# Patient Record
Sex: Female | Born: 1950 | Race: White | Hispanic: No | Marital: Married | Smoking: Never smoker
Health system: Southern US, Community
[De-identification: ages and names within clinical notes are randomized; demographics above are authoritative.]

## PROBLEM LIST (undated history)

## (undated) DIAGNOSIS — D649 Anemia, unspecified: Secondary | ICD-10-CM

## (undated) DIAGNOSIS — T4145XA Adverse effect of unspecified anesthetic, initial encounter: Secondary | ICD-10-CM

## (undated) DIAGNOSIS — I272 Pulmonary hypertension, unspecified: Secondary | ICD-10-CM

## (undated) DIAGNOSIS — I10 Essential (primary) hypertension: Secondary | ICD-10-CM

## (undated) DIAGNOSIS — M810 Age-related osteoporosis without current pathological fracture: Secondary | ICD-10-CM

## (undated) DIAGNOSIS — I4891 Unspecified atrial fibrillation: Secondary | ICD-10-CM

## (undated) DIAGNOSIS — Z9081 Acquired absence of spleen: Secondary | ICD-10-CM

## (undated) DIAGNOSIS — J309 Allergic rhinitis, unspecified: Secondary | ICD-10-CM

## (undated) DIAGNOSIS — I340 Nonrheumatic mitral (valve) insufficiency: Secondary | ICD-10-CM

## (undated) DIAGNOSIS — D5521 Anemia due to pyruvate kinase deficiency: Secondary | ICD-10-CM

## (undated) DIAGNOSIS — R55 Syncope and collapse: Secondary | ICD-10-CM

## (undated) DIAGNOSIS — R0902 Hypoxemia: Secondary | ICD-10-CM

## (undated) DIAGNOSIS — R5383 Other fatigue: Secondary | ICD-10-CM

## (undated) DIAGNOSIS — I471 Supraventricular tachycardia: Secondary | ICD-10-CM

## (undated) DIAGNOSIS — R0600 Dyspnea, unspecified: Secondary | ICD-10-CM

## (undated) DIAGNOSIS — D552 Anemia due to disorders of glycolytic enzymes: Secondary | ICD-10-CM

## (undated) DIAGNOSIS — K2970 Gastritis, unspecified, without bleeding: Secondary | ICD-10-CM

## (undated) DIAGNOSIS — K219 Gastro-esophageal reflux disease without esophagitis: Secondary | ICD-10-CM

## (undated) DIAGNOSIS — I4719 Other supraventricular tachycardia: Secondary | ICD-10-CM

## (undated) DIAGNOSIS — F32A Depression, unspecified: Secondary | ICD-10-CM

## (undated) DIAGNOSIS — J45909 Unspecified asthma, uncomplicated: Secondary | ICD-10-CM

## (undated) DIAGNOSIS — F329 Major depressive disorder, single episode, unspecified: Secondary | ICD-10-CM

## (undated) DIAGNOSIS — G253 Myoclonus: Secondary | ICD-10-CM

## (undated) DIAGNOSIS — M7060 Trochanteric bursitis, unspecified hip: Secondary | ICD-10-CM

## (undated) DIAGNOSIS — R011 Cardiac murmur, unspecified: Secondary | ICD-10-CM

## (undated) DIAGNOSIS — T8859XA Other complications of anesthesia, initial encounter: Secondary | ICD-10-CM

## (undated) HISTORY — DX: Age-related osteoporosis without current pathological fracture: M81.0

## (undated) HISTORY — DX: Dyspnea, unspecified: R06.00

## (undated) HISTORY — DX: Other supraventricular tachycardia: I47.19

## (undated) HISTORY — DX: Allergic rhinitis, unspecified: J30.9

## (undated) HISTORY — DX: Gastro-esophageal reflux disease without esophagitis: K21.9

## (undated) HISTORY — DX: Myoclonus: G25.3

## (undated) HISTORY — DX: Nonrheumatic mitral (valve) insufficiency: I34.0

## (undated) HISTORY — PX: CHOLECYSTECTOMY: SHX55

## (undated) HISTORY — DX: Anemia due to pyruvate kinase deficiency: D55.21

## (undated) HISTORY — DX: Anemia, unspecified: D64.9

## (undated) HISTORY — DX: Acquired absence of spleen: Z90.81

## (undated) HISTORY — DX: Essential (primary) hypertension: I10

## (undated) HISTORY — DX: Supraventricular tachycardia: I47.1

## (undated) HISTORY — PX: SPLENECTOMY: SUR1306

## (undated) HISTORY — DX: Trochanteric bursitis, unspecified hip: M70.60

## (undated) HISTORY — DX: Major depressive disorder, single episode, unspecified: F32.9

## (undated) HISTORY — DX: Anemia due to disorders of glycolytic enzymes: D55.2

## (undated) HISTORY — DX: Other fatigue: R53.83

## (undated) HISTORY — PX: ABLATION: SHX5711

## (undated) HISTORY — DX: Depression, unspecified: F32.A

## (undated) HISTORY — PX: MAZE: SHX5063

## (undated) HISTORY — DX: Pulmonary hypertension, unspecified: I27.20

## (undated) HISTORY — DX: Gastritis, unspecified, without bleeding: K29.70

## (undated) HISTORY — DX: Hypoxemia: R09.02

---

## 1997-09-24 ENCOUNTER — Ambulatory Visit (HOSPITAL_COMMUNITY): Admission: RE | Admit: 1997-09-24 | Discharge: 1997-09-24 | Payer: Self-pay | Admitting: Obstetrics and Gynecology

## 1998-09-01 ENCOUNTER — Other Ambulatory Visit: Admission: RE | Admit: 1998-09-01 | Discharge: 1998-09-01 | Payer: Self-pay | Admitting: Obstetrics and Gynecology

## 1998-12-13 ENCOUNTER — Ambulatory Visit (HOSPITAL_COMMUNITY): Admission: RE | Admit: 1998-12-13 | Discharge: 1998-12-13 | Payer: Self-pay | Admitting: Obstetrics and Gynecology

## 1998-12-13 ENCOUNTER — Encounter: Payer: Self-pay | Admitting: Obstetrics and Gynecology

## 1999-09-21 ENCOUNTER — Other Ambulatory Visit: Admission: RE | Admit: 1999-09-21 | Discharge: 1999-09-21 | Payer: Self-pay | Admitting: Obstetrics and Gynecology

## 2002-06-25 ENCOUNTER — Ambulatory Visit (HOSPITAL_COMMUNITY): Admission: RE | Admit: 2002-06-25 | Discharge: 2002-06-25 | Payer: Self-pay | Admitting: Oncology

## 2002-07-27 ENCOUNTER — Encounter: Payer: Self-pay | Admitting: Obstetrics and Gynecology

## 2002-07-27 ENCOUNTER — Ambulatory Visit (HOSPITAL_COMMUNITY): Admission: RE | Admit: 2002-07-27 | Discharge: 2002-07-27 | Payer: Self-pay | Admitting: Obstetrics and Gynecology

## 2003-07-12 ENCOUNTER — Other Ambulatory Visit: Admission: RE | Admit: 2003-07-12 | Discharge: 2003-07-12 | Payer: Self-pay | Admitting: Obstetrics and Gynecology

## 2004-07-13 ENCOUNTER — Other Ambulatory Visit: Admission: RE | Admit: 2004-07-13 | Discharge: 2004-07-13 | Payer: Self-pay | Admitting: Obstetrics and Gynecology

## 2004-07-20 ENCOUNTER — Ambulatory Visit: Payer: Self-pay | Admitting: Oncology

## 2004-09-08 ENCOUNTER — Ambulatory Visit: Payer: Self-pay | Admitting: Oncology

## 2004-10-04 ENCOUNTER — Inpatient Hospital Stay (HOSPITAL_COMMUNITY): Admission: AD | Admit: 2004-10-04 | Discharge: 2004-10-06 | Payer: Self-pay | Admitting: Cardiology

## 2004-12-11 ENCOUNTER — Ambulatory Visit: Payer: Self-pay | Admitting: Oncology

## 2005-02-01 ENCOUNTER — Ambulatory Visit: Payer: Self-pay | Admitting: Oncology

## 2005-04-09 ENCOUNTER — Ambulatory Visit: Payer: Self-pay | Admitting: Oncology

## 2005-06-11 ENCOUNTER — Ambulatory Visit: Payer: Self-pay | Admitting: Oncology

## 2005-08-24 ENCOUNTER — Ambulatory Visit: Payer: Self-pay | Admitting: Oncology

## 2005-11-23 ENCOUNTER — Ambulatory Visit: Payer: Self-pay | Admitting: Oncology

## 2005-12-03 LAB — COMPREHENSIVE METABOLIC PANEL
ALT: 16 U/L (ref 0–40)
CO2: 26 mEq/L (ref 19–32)
Calcium: 9.3 mg/dL (ref 8.4–10.5)
Chloride: 106 mEq/L (ref 96–112)
Creatinine, Ser: 0.66 mg/dL (ref 0.40–1.20)
Glucose, Bld: 97 mg/dL (ref 70–99)
Sodium: 139 mEq/L (ref 135–145)
Total Bilirubin: 2.3 mg/dL — ABNORMAL HIGH (ref 0.3–1.2)
Total Protein: 6.9 g/dL (ref 6.0–8.3)

## 2005-12-03 LAB — CBC WITH DIFFERENTIAL/PLATELET
Eosinophils Absolute: 0.6 10*3/uL — ABNORMAL HIGH (ref 0.0–0.5)
HGB: 8.5 g/dL — ABNORMAL LOW (ref 11.6–15.9)
MCV: 115.8 fL — ABNORMAL HIGH (ref 81.0–101.0)
MONO#: 1.4 10*3/uL — ABNORMAL HIGH (ref 0.1–0.9)
MONO%: 14.7 % — ABNORMAL HIGH (ref 0.0–13.0)
NEUT#: 3.9 10*3/uL (ref 1.5–6.5)
RBC: 2.19 10*6/uL — ABNORMAL LOW (ref 3.70–5.32)
RDW: 14.3 % (ref 11.3–14.5)
WBC: 9.3 10*3/uL (ref 3.9–10.0)
lymph#: 3.4 10*3/uL — ABNORMAL HIGH (ref 0.9–3.3)

## 2006-02-21 ENCOUNTER — Ambulatory Visit: Payer: Self-pay | Admitting: Oncology

## 2006-02-25 LAB — CBC WITH DIFFERENTIAL/PLATELET
Basophils Absolute: 0.1 10*3/uL (ref 0.0–0.1)
Eosinophils Absolute: 0.6 10*3/uL — ABNORMAL HIGH (ref 0.0–0.5)
HCT: 24.7 % — ABNORMAL LOW (ref 34.8–46.6)
HGB: 8.1 g/dL — ABNORMAL LOW (ref 11.6–15.9)
MCH: 38.4 pg — ABNORMAL HIGH (ref 26.0–34.0)
NEUT#: 4.3 10*3/uL (ref 1.5–6.5)
NEUT%: 42.7 % (ref 39.6–76.8)
RDW: 14.1 % (ref 11.3–14.5)
lymph#: 3.7 10*3/uL — ABNORMAL HIGH (ref 0.9–3.3)

## 2006-02-25 LAB — COMPREHENSIVE METABOLIC PANEL
Albumin: 4.2 g/dL (ref 3.5–5.2)
BUN: 19 mg/dL (ref 6–23)
CO2: 26 mEq/L (ref 19–32)
Calcium: 9.7 mg/dL (ref 8.4–10.5)
Chloride: 106 mEq/L (ref 96–112)
Creatinine, Ser: 0.77 mg/dL (ref 0.40–1.20)
Glucose, Bld: 74 mg/dL (ref 70–99)
Potassium: 4.1 mEq/L (ref 3.5–5.3)

## 2006-02-25 LAB — FERRITIN: Ferritin: 82 ng/mL (ref 10–291)

## 2006-02-28 ENCOUNTER — Other Ambulatory Visit: Admission: RE | Admit: 2006-02-28 | Discharge: 2006-02-28 | Payer: Self-pay | Admitting: Obstetrics and Gynecology

## 2006-03-22 ENCOUNTER — Ambulatory Visit: Payer: Self-pay | Admitting: Oncology

## 2006-05-08 ENCOUNTER — Ambulatory Visit: Payer: Self-pay | Admitting: Oncology

## 2006-06-03 LAB — COMPREHENSIVE METABOLIC PANEL
ALT: 18 U/L (ref 0–35)
Albumin: 4.4 g/dL (ref 3.5–5.2)
CO2: 25 mEq/L (ref 19–32)
Calcium: 9.4 mg/dL (ref 8.4–10.5)
Chloride: 104 mEq/L (ref 96–112)
Glucose, Bld: 99 mg/dL (ref 70–99)
Potassium: 4.3 mEq/L (ref 3.5–5.3)
Sodium: 138 mEq/L (ref 135–145)
Total Protein: 7.4 g/dL (ref 6.0–8.3)

## 2006-06-03 LAB — CBC WITH DIFFERENTIAL/PLATELET
BASO%: 0.8 % (ref 0.0–2.0)
Eosinophils Absolute: 0.4 10*3/uL (ref 0.0–0.5)
LYMPH%: 39.8 % (ref 14.0–48.0)
MCHC: 31.9 g/dL — ABNORMAL LOW (ref 32.0–36.0)
MONO#: 1.1 10*3/uL — ABNORMAL HIGH (ref 0.1–0.9)
NEUT#: 2.9 10*3/uL (ref 1.5–6.5)
Platelets: 590 10*3/uL — ABNORMAL HIGH (ref 145–400)
RBC: 2.43 10*6/uL — ABNORMAL LOW (ref 3.70–5.32)
RDW: 14.3 % (ref 11.3–14.5)
WBC: 7.5 10*3/uL (ref 3.9–10.0)
lymph#: 3 10*3/uL (ref 0.9–3.3)

## 2006-06-03 LAB — FERRITIN: Ferritin: 70 ng/mL (ref 10–291)

## 2006-08-28 ENCOUNTER — Ambulatory Visit: Payer: Self-pay | Admitting: Oncology

## 2006-09-24 LAB — CBC WITH DIFFERENTIAL/PLATELET
BASO%: 1.5 % (ref 0.0–2.0)
EOS%: 3.9 % (ref 0.0–7.0)
HCT: 25.4 % — ABNORMAL LOW (ref 34.8–46.6)
MCH: 37.3 pg — ABNORMAL HIGH (ref 26.0–34.0)
MCHC: 33.5 g/dL (ref 32.0–36.0)
MONO#: 1.2 10*3/uL — ABNORMAL HIGH (ref 0.1–0.9)
NEUT%: 42.7 % (ref 39.6–76.8)
RBC: 2.29 10*6/uL — ABNORMAL LOW (ref 3.70–5.32)
WBC: 9.1 10*3/uL (ref 3.9–10.0)
lymph#: 3.5 10*3/uL — ABNORMAL HIGH (ref 0.9–3.3)

## 2006-09-24 LAB — COMPREHENSIVE METABOLIC PANEL
ALT: 12 U/L (ref 0–35)
AST: 19 U/L (ref 0–37)
CO2: 25 mEq/L (ref 19–32)
Calcium: 9.2 mg/dL (ref 8.4–10.5)
Chloride: 106 mEq/L (ref 96–112)
Creatinine, Ser: 0.79 mg/dL (ref 0.40–1.20)
Sodium: 139 mEq/L (ref 135–145)
Total Bilirubin: 2.1 mg/dL — ABNORMAL HIGH (ref 0.3–1.2)
Total Protein: 7.2 g/dL (ref 6.0–8.3)

## 2006-09-24 LAB — FERRITIN: Ferritin: 125 ng/mL (ref 10–291)

## 2006-09-24 LAB — UA PROTEIN, DIPSTICK - CHCC: Protein, Urine: NEGATIVE mg/dL

## 2006-11-03 ENCOUNTER — Inpatient Hospital Stay (HOSPITAL_COMMUNITY): Admission: EM | Admit: 2006-11-03 | Discharge: 2006-11-05 | Payer: Self-pay | Admitting: Emergency Medicine

## 2006-12-19 ENCOUNTER — Ambulatory Visit: Payer: Self-pay | Admitting: Oncology

## 2006-12-24 LAB — CBC WITH DIFFERENTIAL/PLATELET
BASO%: 1 % (ref 0.0–2.0)
EOS%: 3.4 % (ref 0.0–7.0)
MCH: 38.1 pg — ABNORMAL HIGH (ref 26.0–34.0)
MCHC: 33.4 g/dL (ref 32.0–36.0)
MONO#: 1.3 10*3/uL — ABNORMAL HIGH (ref 0.1–0.9)
RDW: 14.1 % (ref 11.3–14.5)
WBC: 8.1 10*3/uL (ref 3.9–10.0)
lymph#: 3.5 10*3/uL — ABNORMAL HIGH (ref 0.9–3.3)

## 2006-12-24 LAB — COMPREHENSIVE METABOLIC PANEL
ALT: 16 U/L (ref 0–35)
AST: 21 U/L (ref 0–37)
Albumin: 4.4 g/dL (ref 3.5–5.2)
CO2: 24 mEq/L (ref 19–32)
Calcium: 9.3 mg/dL (ref 8.4–10.5)
Chloride: 103 mEq/L (ref 96–112)
Potassium: 4.6 mEq/L (ref 3.5–5.3)
Sodium: 138 mEq/L (ref 135–145)
Total Protein: 7 g/dL (ref 6.0–8.3)

## 2007-03-03 ENCOUNTER — Other Ambulatory Visit: Admission: RE | Admit: 2007-03-03 | Discharge: 2007-03-03 | Payer: Self-pay | Admitting: Obstetrics and Gynecology

## 2007-04-03 ENCOUNTER — Ambulatory Visit: Payer: Self-pay | Admitting: Oncology

## 2007-04-07 LAB — CBC WITH DIFFERENTIAL/PLATELET
Eosinophils Absolute: 0.2 10*3/uL (ref 0.0–0.5)
LYMPH%: 43.4 % (ref 14.0–48.0)
MCHC: 33.6 g/dL (ref 32.0–36.0)
MCV: 113.9 fL — ABNORMAL HIGH (ref 81.0–101.0)
MONO%: 11.4 % (ref 0.0–13.0)
NEUT#: 3.7 10*3/uL (ref 1.5–6.5)
Platelets: 522 10*3/uL — ABNORMAL HIGH (ref 145–400)
RBC: 2.09 10*6/uL — ABNORMAL LOW (ref 3.70–5.32)

## 2007-04-07 LAB — COMPREHENSIVE METABOLIC PANEL
Alkaline Phosphatase: 77 U/L (ref 39–117)
BUN: 21 mg/dL (ref 6–23)
Creatinine, Ser: 0.86 mg/dL (ref 0.40–1.20)
Glucose, Bld: 88 mg/dL (ref 70–99)
Sodium: 140 mEq/L (ref 135–145)
Total Bilirubin: 1.6 mg/dL — ABNORMAL HIGH (ref 0.3–1.2)
Total Protein: 7 g/dL (ref 6.0–8.3)

## 2007-05-29 HISTORY — PX: OTHER SURGICAL HISTORY: SHX169

## 2007-07-04 ENCOUNTER — Ambulatory Visit: Payer: Self-pay | Admitting: Oncology

## 2007-07-08 LAB — CBC WITH DIFFERENTIAL/PLATELET
BASO%: 0.3 % (ref 0.0–2.0)
EOS%: 0.6 % (ref 0.0–7.0)
MCH: 36.3 pg — ABNORMAL HIGH (ref 26.0–34.0)
MCHC: 32.7 g/dL (ref 32.0–36.0)
RBC: 2.37 10*6/uL — ABNORMAL LOW (ref 3.70–5.32)
RDW: 13.2 % (ref 11.3–14.5)
lymph#: 3.4 10*3/uL — ABNORMAL HIGH (ref 0.9–3.3)

## 2007-07-08 LAB — COMPREHENSIVE METABOLIC PANEL
ALT: 9 U/L (ref 0–35)
AST: 16 U/L (ref 0–37)
Albumin: 4.6 g/dL (ref 3.5–5.2)
Calcium: 9.8 mg/dL (ref 8.4–10.5)
Chloride: 104 mEq/L (ref 96–112)
Potassium: 4.2 mEq/L (ref 3.5–5.3)

## 2007-07-08 LAB — FERRITIN: Ferritin: 50 ng/mL (ref 10–291)

## 2007-08-25 ENCOUNTER — Encounter: Admission: RE | Admit: 2007-08-25 | Discharge: 2007-08-25 | Payer: Self-pay | Admitting: Internal Medicine

## 2007-10-03 ENCOUNTER — Ambulatory Visit: Payer: Self-pay | Admitting: Oncology

## 2007-10-07 ENCOUNTER — Emergency Department (HOSPITAL_COMMUNITY): Admission: EM | Admit: 2007-10-07 | Discharge: 2007-10-07 | Payer: Self-pay | Admitting: Emergency Medicine

## 2007-10-07 ENCOUNTER — Inpatient Hospital Stay (HOSPITAL_COMMUNITY): Admission: EM | Admit: 2007-10-07 | Discharge: 2007-10-10 | Payer: Self-pay | Admitting: Emergency Medicine

## 2007-10-07 ENCOUNTER — Ambulatory Visit: Payer: Self-pay | Admitting: Oncology

## 2007-12-18 ENCOUNTER — Ambulatory Visit: Payer: Self-pay | Admitting: Oncology

## 2007-12-18 LAB — CBC WITH DIFFERENTIAL/PLATELET
Eosinophils Absolute: 0.3 10*3/uL (ref 0.0–0.5)
HCT: 30.3 % — ABNORMAL LOW (ref 34.8–46.6)
HGB: 10 g/dL — ABNORMAL LOW (ref 11.6–15.9)
LYMPH%: 35.4 % (ref 14.0–48.0)
MONO#: 1.5 10*3/uL — ABNORMAL HIGH (ref 0.1–0.9)
NEUT#: 6.3 10*3/uL (ref 1.5–6.5)
Platelets: 543 10*3/uL — ABNORMAL HIGH (ref 145–400)
RBC: 2.84 10*6/uL — ABNORMAL LOW (ref 3.70–5.32)
WBC: 12.7 10*3/uL — ABNORMAL HIGH (ref 3.9–10.0)

## 2007-12-18 LAB — FERRITIN: Ferritin: 61 ng/mL (ref 10–291)

## 2007-12-18 LAB — COMPREHENSIVE METABOLIC PANEL
ALT: 16 U/L (ref 0–35)
CO2: 27 mEq/L (ref 19–32)
Calcium: 9.5 mg/dL (ref 8.4–10.5)
Chloride: 99 mEq/L (ref 96–112)
Creatinine, Ser: 0.95 mg/dL (ref 0.40–1.20)
Total Protein: 7.3 g/dL (ref 6.0–8.3)

## 2007-12-18 LAB — TECHNOLOGIST REVIEW: Technologist Review: 1

## 2008-01-05 LAB — CBC WITH DIFFERENTIAL/PLATELET
Eosinophils Absolute: 0.2 10*3/uL (ref 0.0–0.5)
HGB: 9.4 g/dL — ABNORMAL LOW (ref 11.6–15.9)
MONO#: 1.1 10*3/uL — ABNORMAL HIGH (ref 0.1–0.9)
NEUT#: 4.5 10*3/uL (ref 1.5–6.5)
Platelets: 560 10*3/uL — ABNORMAL HIGH (ref 145–400)
RBC: 2.62 10*6/uL — ABNORMAL LOW (ref 3.70–5.32)
RDW: 22.6 % — ABNORMAL HIGH (ref 11.3–14.5)
WBC: 9 10*3/uL (ref 3.9–10.0)

## 2008-01-05 LAB — COMPREHENSIVE METABOLIC PANEL
ALT: 15 U/L (ref 0–35)
Albumin: 4.2 g/dL (ref 3.5–5.2)
CO2: 28 mEq/L (ref 19–32)
Calcium: 9.4 mg/dL (ref 8.4–10.5)
Chloride: 104 mEq/L (ref 96–112)
Sodium: 140 mEq/L (ref 135–145)
Total Protein: 7.1 g/dL (ref 6.0–8.3)

## 2008-01-05 LAB — TECHNOLOGIST REVIEW

## 2008-01-05 LAB — FERRITIN: Ferritin: 89 ng/mL (ref 10–291)

## 2008-02-29 ENCOUNTER — Emergency Department (HOSPITAL_COMMUNITY): Admission: EM | Admit: 2008-02-29 | Discharge: 2008-02-29 | Payer: Self-pay | Admitting: Emergency Medicine

## 2008-03-01 ENCOUNTER — Inpatient Hospital Stay (HOSPITAL_COMMUNITY): Admission: AD | Admit: 2008-03-01 | Discharge: 2008-03-07 | Payer: Self-pay | Admitting: Cardiology

## 2008-03-01 ENCOUNTER — Ambulatory Visit: Payer: Self-pay | Admitting: Pulmonary Disease

## 2008-03-01 ENCOUNTER — Ambulatory Visit: Payer: Self-pay | Admitting: Thoracic Surgery (Cardiothoracic Vascular Surgery)

## 2008-03-02 ENCOUNTER — Encounter (INDEPENDENT_AMBULATORY_CARE_PROVIDER_SITE_OTHER): Payer: Self-pay | Admitting: Cardiology

## 2008-03-03 ENCOUNTER — Ambulatory Visit: Payer: Self-pay | Admitting: Oncology

## 2008-03-03 ENCOUNTER — Encounter (INDEPENDENT_AMBULATORY_CARE_PROVIDER_SITE_OTHER): Payer: Self-pay | Admitting: Cardiology

## 2008-03-03 ENCOUNTER — Encounter: Payer: Self-pay | Admitting: Thoracic Surgery (Cardiothoracic Vascular Surgery)

## 2008-03-12 ENCOUNTER — Ambulatory Visit: Payer: Self-pay | Admitting: Thoracic Surgery (Cardiothoracic Vascular Surgery)

## 2008-03-25 ENCOUNTER — Ambulatory Visit: Payer: Self-pay | Admitting: Thoracic Surgery (Cardiothoracic Vascular Surgery)

## 2008-03-25 ENCOUNTER — Encounter
Admission: RE | Admit: 2008-03-25 | Discharge: 2008-03-25 | Payer: Self-pay | Admitting: Thoracic Surgery (Cardiothoracic Vascular Surgery)

## 2008-03-26 ENCOUNTER — Ambulatory Visit (HOSPITAL_COMMUNITY): Admission: RE | Admit: 2008-03-26 | Discharge: 2008-03-26 | Payer: Self-pay | Admitting: Cardiology

## 2008-03-30 ENCOUNTER — Ambulatory Visit (HOSPITAL_COMMUNITY): Admission: RE | Admit: 2008-03-30 | Discharge: 2008-03-30 | Payer: Self-pay | Admitting: Cardiology

## 2008-03-30 ENCOUNTER — Encounter (INDEPENDENT_AMBULATORY_CARE_PROVIDER_SITE_OTHER): Payer: Self-pay | Admitting: Cardiology

## 2008-03-31 ENCOUNTER — Inpatient Hospital Stay (HOSPITAL_COMMUNITY): Admission: EM | Admit: 2008-03-31 | Discharge: 2008-04-09 | Payer: Self-pay | Admitting: Emergency Medicine

## 2008-04-02 ENCOUNTER — Ambulatory Visit: Payer: Self-pay | Admitting: Oncology

## 2008-04-06 ENCOUNTER — Ambulatory Visit: Payer: Self-pay | Admitting: Thoracic Surgery (Cardiothoracic Vascular Surgery)

## 2008-06-02 ENCOUNTER — Ambulatory Visit: Payer: Self-pay | Admitting: Oncology

## 2008-06-04 LAB — COMPREHENSIVE METABOLIC PANEL
AST: 19 U/L (ref 0–37)
Albumin: 4.3 g/dL (ref 3.5–5.2)
Alkaline Phosphatase: 92 U/L (ref 39–117)
Chloride: 101 mEq/L (ref 96–112)
Glucose, Bld: 82 mg/dL (ref 70–99)
Potassium: 4.4 mEq/L (ref 3.5–5.3)
Sodium: 139 mEq/L (ref 135–145)
Total Protein: 6.8 g/dL (ref 6.0–8.3)

## 2008-06-04 LAB — CBC WITH DIFFERENTIAL/PLATELET
Eosinophils Absolute: 0.3 10*3/uL (ref 0.0–0.5)
MCV: 109.5 fL — ABNORMAL HIGH (ref 81.0–101.0)
MONO%: 17 % — ABNORMAL HIGH (ref 0.0–13.0)
NEUT#: 4.1 10*3/uL (ref 1.5–6.5)
RBC: 2.71 10*6/uL — ABNORMAL LOW (ref 3.70–5.32)
RDW: 18.3 % — ABNORMAL HIGH (ref 11.3–14.5)
WBC: 9.4 10*3/uL (ref 3.9–10.0)
lymph#: 3.3 10*3/uL (ref 0.9–3.3)

## 2008-06-07 ENCOUNTER — Ambulatory Visit: Payer: Self-pay | Admitting: Thoracic Surgery (Cardiothoracic Vascular Surgery)

## 2008-06-21 ENCOUNTER — Ambulatory Visit: Payer: Self-pay | Admitting: Thoracic Surgery (Cardiothoracic Vascular Surgery)

## 2008-07-16 ENCOUNTER — Ambulatory Visit (HOSPITAL_COMMUNITY)
Admission: RE | Admit: 2008-07-16 | Discharge: 2008-07-16 | Payer: Self-pay | Admitting: Thoracic Surgery (Cardiothoracic Vascular Surgery)

## 2008-07-16 ENCOUNTER — Encounter: Payer: Self-pay | Admitting: Thoracic Surgery (Cardiothoracic Vascular Surgery)

## 2008-07-19 ENCOUNTER — Ambulatory Visit: Payer: Self-pay | Admitting: Thoracic Surgery (Cardiothoracic Vascular Surgery)

## 2008-07-20 ENCOUNTER — Inpatient Hospital Stay (HOSPITAL_COMMUNITY)
Admission: RE | Admit: 2008-07-20 | Discharge: 2008-07-28 | Payer: Self-pay | Admitting: Thoracic Surgery (Cardiothoracic Vascular Surgery)

## 2008-07-20 ENCOUNTER — Ambulatory Visit: Payer: Self-pay | Admitting: Thoracic Surgery (Cardiothoracic Vascular Surgery)

## 2008-07-26 ENCOUNTER — Ambulatory Visit: Payer: Self-pay | Admitting: Thoracic Surgery (Cardiothoracic Vascular Surgery)

## 2008-08-06 ENCOUNTER — Ambulatory Visit: Payer: Self-pay | Admitting: Thoracic Surgery (Cardiothoracic Vascular Surgery)

## 2008-08-09 ENCOUNTER — Encounter
Admission: RE | Admit: 2008-08-09 | Discharge: 2008-08-09 | Payer: Self-pay | Admitting: Thoracic Surgery (Cardiothoracic Vascular Surgery)

## 2008-08-09 ENCOUNTER — Ambulatory Visit: Payer: Self-pay | Admitting: Thoracic Surgery (Cardiothoracic Vascular Surgery)

## 2008-08-18 ENCOUNTER — Ambulatory Visit: Payer: Self-pay | Admitting: Thoracic Surgery (Cardiothoracic Vascular Surgery)

## 2008-08-19 ENCOUNTER — Encounter (HOSPITAL_COMMUNITY): Admission: RE | Admit: 2008-08-19 | Discharge: 2008-11-17 | Payer: Self-pay | Admitting: Cardiology

## 2008-08-31 ENCOUNTER — Ambulatory Visit: Payer: Self-pay | Admitting: Oncology

## 2008-09-02 LAB — COMPREHENSIVE METABOLIC PANEL
AST: 17 U/L (ref 0–37)
Albumin: 4.2 g/dL (ref 3.5–5.2)
BUN: 14 mg/dL (ref 6–23)
CO2: 27 mEq/L (ref 19–32)
Calcium: 9.6 mg/dL (ref 8.4–10.5)
Chloride: 101 mEq/L (ref 96–112)
Creatinine, Ser: 0.82 mg/dL (ref 0.40–1.20)
Glucose, Bld: 115 mg/dL — ABNORMAL HIGH (ref 70–99)
Potassium: 3.7 mEq/L (ref 3.5–5.3)

## 2008-09-02 LAB — CBC WITH DIFFERENTIAL/PLATELET
Basophils Absolute: 0 10*3/uL (ref 0.0–0.1)
Eosinophils Absolute: 0.2 10*3/uL (ref 0.0–0.5)
HCT: 29.6 % — ABNORMAL LOW (ref 34.8–46.6)
HGB: 9.9 g/dL — ABNORMAL LOW (ref 11.6–15.9)
NEUT#: 4.3 10*3/uL (ref 1.5–6.5)
NEUT%: 53.6 % (ref 38.4–76.8)
RDW: 23.6 % — ABNORMAL HIGH (ref 11.2–14.5)
lymph#: 2.2 10*3/uL (ref 0.9–3.3)

## 2008-09-02 LAB — FERRITIN: Ferritin: 94 ng/mL (ref 10–291)

## 2008-09-08 ENCOUNTER — Ambulatory Visit: Payer: Self-pay | Admitting: Thoracic Surgery (Cardiothoracic Vascular Surgery)

## 2008-09-08 ENCOUNTER — Encounter
Admission: RE | Admit: 2008-09-08 | Discharge: 2008-09-08 | Payer: Self-pay | Admitting: Thoracic Surgery (Cardiothoracic Vascular Surgery)

## 2008-09-10 ENCOUNTER — Ambulatory Visit (HOSPITAL_COMMUNITY)
Admission: RE | Admit: 2008-09-10 | Discharge: 2008-09-10 | Payer: Self-pay | Admitting: Thoracic Surgery (Cardiothoracic Vascular Surgery)

## 2008-11-01 ENCOUNTER — Encounter
Admission: RE | Admit: 2008-11-01 | Discharge: 2008-11-01 | Payer: Self-pay | Admitting: Thoracic Surgery (Cardiothoracic Vascular Surgery)

## 2008-11-01 ENCOUNTER — Ambulatory Visit: Payer: Self-pay | Admitting: Thoracic Surgery (Cardiothoracic Vascular Surgery)

## 2008-11-18 ENCOUNTER — Encounter (HOSPITAL_COMMUNITY): Admission: RE | Admit: 2008-11-18 | Discharge: 2008-11-24 | Payer: Self-pay | Admitting: Cardiology

## 2008-11-30 ENCOUNTER — Ambulatory Visit: Payer: Self-pay | Admitting: Oncology

## 2008-12-02 ENCOUNTER — Other Ambulatory Visit: Admission: RE | Admit: 2008-12-02 | Discharge: 2008-12-02 | Payer: Self-pay | Admitting: Obstetrics and Gynecology

## 2008-12-02 LAB — FERRITIN: Ferritin: 73 ng/mL (ref 10–291)

## 2008-12-02 LAB — CBC WITH DIFFERENTIAL/PLATELET
BASO%: 0.8 % (ref 0.0–2.0)
Eosinophils Absolute: 0.2 10*3/uL (ref 0.0–0.5)
MCHC: 33.1 g/dL (ref 31.5–36.0)
MONO#: 1.2 10*3/uL — ABNORMAL HIGH (ref 0.1–0.9)
NEUT#: 4.8 10*3/uL (ref 1.5–6.5)
RBC: 2.51 10*6/uL — ABNORMAL LOW (ref 3.70–5.45)
RDW: 14.3 % (ref 11.2–14.5)
WBC: 9.1 10*3/uL (ref 3.9–10.3)
lymph#: 2.8 10*3/uL (ref 0.9–3.3)

## 2009-01-24 ENCOUNTER — Ambulatory Visit: Payer: Self-pay | Admitting: Thoracic Surgery (Cardiothoracic Vascular Surgery)

## 2009-01-24 ENCOUNTER — Encounter
Admission: RE | Admit: 2009-01-24 | Discharge: 2009-01-24 | Payer: Self-pay | Admitting: Thoracic Surgery (Cardiothoracic Vascular Surgery)

## 2009-03-01 ENCOUNTER — Ambulatory Visit: Payer: Self-pay | Admitting: Oncology

## 2009-03-03 LAB — CBC WITH DIFFERENTIAL/PLATELET
Basophils Absolute: 0 10*3/uL (ref 0.0–0.1)
EOS%: 4.2 % (ref 0.0–7.0)
Eosinophils Absolute: 0.4 10*3/uL (ref 0.0–0.5)
HCT: 28.1 % — ABNORMAL LOW (ref 34.8–46.6)
HGB: 9.4 g/dL — ABNORMAL LOW (ref 11.6–15.9)
MCH: 38.8 pg — ABNORMAL HIGH (ref 25.1–34.0)
NEUT#: 5.2 10*3/uL (ref 1.5–6.5)
NEUT%: 54.1 % (ref 38.4–76.8)
lymph#: 2.3 10*3/uL (ref 0.9–3.3)

## 2009-03-03 LAB — FERRITIN: Ferritin: 69 ng/mL (ref 10–291)

## 2009-03-03 LAB — COMPREHENSIVE METABOLIC PANEL
Albumin: 4.3 g/dL (ref 3.5–5.2)
Alkaline Phosphatase: 93 U/L (ref 39–117)
BUN: 17 mg/dL (ref 6–23)
CO2: 27 mEq/L (ref 19–32)
Calcium: 8.9 mg/dL (ref 8.4–10.5)
Chloride: 103 mEq/L (ref 96–112)
Glucose, Bld: 67 mg/dL — ABNORMAL LOW (ref 70–99)
Potassium: 3.6 mEq/L (ref 3.5–5.3)
Total Protein: 7.2 g/dL (ref 6.0–8.3)

## 2009-07-01 ENCOUNTER — Ambulatory Visit: Payer: Self-pay | Admitting: Oncology

## 2009-07-05 LAB — CBC WITH DIFFERENTIAL/PLATELET
BASO%: 1 % (ref 0.0–2.0)
Basophils Absolute: 0.1 10*3/uL (ref 0.0–0.1)
EOS%: 2.8 % (ref 0.0–7.0)
MCH: 34.1 pg — ABNORMAL HIGH (ref 25.1–34.0)
MCHC: 31.1 g/dL — ABNORMAL LOW (ref 31.5–36.0)
MCV: 109.5 fL — ABNORMAL HIGH (ref 79.5–101.0)
MONO%: 16.9 % — ABNORMAL HIGH (ref 0.0–14.0)
RBC: 2.73 10*6/uL — ABNORMAL LOW (ref 3.70–5.45)
RDW: 14.6 % — ABNORMAL HIGH (ref 11.2–14.5)
lymph#: 3.4 10*3/uL — ABNORMAL HIGH (ref 0.9–3.3)

## 2009-07-05 LAB — COMPREHENSIVE METABOLIC PANEL
ALT: 16 U/L (ref 0–35)
AST: 25 U/L (ref 0–37)
Albumin: 4.2 g/dL (ref 3.5–5.2)
Alkaline Phosphatase: 83 U/L (ref 39–117)
BUN: 27 mg/dL — ABNORMAL HIGH (ref 6–23)
Calcium: 9.5 mg/dL (ref 8.4–10.5)
Chloride: 100 mEq/L (ref 96–112)
Potassium: 4.2 mEq/L (ref 3.5–5.3)
Sodium: 135 mEq/L (ref 135–145)

## 2009-07-22 ENCOUNTER — Encounter: Payer: Self-pay | Admitting: Pulmonary Disease

## 2009-08-02 ENCOUNTER — Ambulatory Visit: Payer: Self-pay | Admitting: Thoracic Surgery (Cardiothoracic Vascular Surgery)

## 2009-08-02 ENCOUNTER — Encounter
Admission: RE | Admit: 2009-08-02 | Discharge: 2009-08-02 | Payer: Self-pay | Admitting: Thoracic Surgery (Cardiothoracic Vascular Surgery)

## 2009-08-02 ENCOUNTER — Encounter: Payer: Self-pay | Admitting: Pulmonary Disease

## 2009-08-02 DIAGNOSIS — I10 Essential (primary) hypertension: Secondary | ICD-10-CM

## 2009-08-02 DIAGNOSIS — J309 Allergic rhinitis, unspecified: Secondary | ICD-10-CM

## 2009-08-02 DIAGNOSIS — K219 Gastro-esophageal reflux disease without esophagitis: Secondary | ICD-10-CM

## 2009-08-02 DIAGNOSIS — I4891 Unspecified atrial fibrillation: Secondary | ICD-10-CM | POA: Insufficient documentation

## 2009-08-03 ENCOUNTER — Ambulatory Visit: Payer: Self-pay | Admitting: Pulmonary Disease

## 2009-08-03 DIAGNOSIS — G4733 Obstructive sleep apnea (adult) (pediatric): Secondary | ICD-10-CM

## 2009-08-03 DIAGNOSIS — R059 Cough, unspecified: Secondary | ICD-10-CM | POA: Insufficient documentation

## 2009-08-03 DIAGNOSIS — R0602 Shortness of breath: Secondary | ICD-10-CM | POA: Insufficient documentation

## 2009-08-03 DIAGNOSIS — R05 Cough: Secondary | ICD-10-CM

## 2009-08-04 ENCOUNTER — Encounter: Admission: RE | Admit: 2009-08-04 | Discharge: 2009-08-04 | Payer: Self-pay | Admitting: Pulmonary Disease

## 2009-08-11 ENCOUNTER — Ambulatory Visit: Payer: Self-pay | Admitting: Critical Care Medicine

## 2009-08-12 ENCOUNTER — Inpatient Hospital Stay (HOSPITAL_COMMUNITY): Admission: EM | Admit: 2009-08-12 | Discharge: 2009-08-17 | Payer: Self-pay | Admitting: Emergency Medicine

## 2009-08-17 ENCOUNTER — Encounter: Payer: Self-pay | Admitting: Pulmonary Disease

## 2009-09-02 ENCOUNTER — Ambulatory Visit: Payer: Self-pay | Admitting: Pulmonary Disease

## 2009-09-02 DIAGNOSIS — D594 Other nonautoimmune hemolytic anemias: Secondary | ICD-10-CM

## 2009-09-02 DIAGNOSIS — J45909 Unspecified asthma, uncomplicated: Secondary | ICD-10-CM

## 2009-09-02 DIAGNOSIS — J986 Disorders of diaphragm: Secondary | ICD-10-CM | POA: Insufficient documentation

## 2009-09-02 LAB — CONVERTED CEMR LAB: IgM, Serum: 25 mg/dL — ABNORMAL LOW (ref 60–263)

## 2009-09-05 ENCOUNTER — Encounter: Payer: Self-pay | Admitting: Pulmonary Disease

## 2009-09-07 ENCOUNTER — Encounter: Admission: RE | Admit: 2009-09-07 | Discharge: 2009-09-07 | Payer: Self-pay | Admitting: Pulmonary Disease

## 2009-09-15 ENCOUNTER — Telehealth: Payer: Self-pay | Admitting: Pulmonary Disease

## 2009-09-30 ENCOUNTER — Inpatient Hospital Stay (HOSPITAL_COMMUNITY): Admission: EM | Admit: 2009-09-30 | Discharge: 2009-10-06 | Payer: Self-pay | Admitting: Emergency Medicine

## 2009-10-03 ENCOUNTER — Ambulatory Visit: Payer: Self-pay | Admitting: Oncology

## 2009-10-12 ENCOUNTER — Ambulatory Visit: Payer: Self-pay | Admitting: Oncology

## 2009-10-13 LAB — CBC WITH DIFFERENTIAL/PLATELET
BASO%: 1.1 % (ref 0.0–2.0)
EOS%: 2.9 % (ref 0.0–7.0)
MCV: 102.3 fL — ABNORMAL HIGH (ref 79.5–101.0)
MONO#: 1.5 10*3/uL — ABNORMAL HIGH (ref 0.1–0.9)
NEUT#: 4.3 10*3/uL (ref 1.5–6.5)
NEUT%: 47.9 % (ref 38.4–76.8)
Platelets: 526 10*3/uL — ABNORMAL HIGH (ref 145–400)
RBC: 2.58 10*6/uL — ABNORMAL LOW (ref 3.70–5.45)
RDW: 26.2 % — ABNORMAL HIGH (ref 11.2–14.5)
lymph#: 2.8 10*3/uL (ref 0.9–3.3)

## 2009-10-13 LAB — COMPREHENSIVE METABOLIC PANEL
ALT: 41 U/L — ABNORMAL HIGH (ref 0–35)
AST: 38 U/L — ABNORMAL HIGH (ref 0–37)
Albumin: 3.7 g/dL (ref 3.5–5.2)
CO2: 30 mEq/L (ref 19–32)
Calcium: 9 mg/dL (ref 8.4–10.5)
Chloride: 103 mEq/L (ref 96–112)
Creatinine, Ser: 0.83 mg/dL (ref 0.40–1.20)
Potassium: 4 mEq/L (ref 3.5–5.3)
Total Protein: 7.5 g/dL (ref 6.0–8.3)

## 2009-10-13 LAB — FERRITIN: Ferritin: 96 ng/mL (ref 10–291)

## 2009-11-01 LAB — COMPREHENSIVE METABOLIC PANEL
ALT: 14 U/L (ref 0–35)
AST: 19 U/L (ref 0–37)
Albumin: 4.1 g/dL (ref 3.5–5.2)
Alkaline Phosphatase: 87 U/L (ref 39–117)
BUN: 21 mg/dL (ref 6–23)
Potassium: 4 mEq/L (ref 3.5–5.3)
Sodium: 138 mEq/L (ref 135–145)

## 2009-11-01 LAB — CBC WITH DIFFERENTIAL/PLATELET
BASO%: 0.5 % (ref 0.0–2.0)
EOS%: 3.6 % (ref 0.0–7.0)
MCH: 34.5 pg — ABNORMAL HIGH (ref 25.1–34.0)
MCHC: 33.3 g/dL (ref 31.5–36.0)
MCV: 103.6 fL — ABNORMAL HIGH (ref 79.5–101.0)
MONO%: 18.6 % — ABNORMAL HIGH (ref 0.0–14.0)
RBC: 2.78 10*6/uL — ABNORMAL LOW (ref 3.70–5.45)
RDW: 22.8 % — ABNORMAL HIGH (ref 11.2–14.5)

## 2009-11-02 ENCOUNTER — Ambulatory Visit: Payer: Self-pay | Admitting: Pulmonary Disease

## 2009-11-21 ENCOUNTER — Telehealth (INDEPENDENT_AMBULATORY_CARE_PROVIDER_SITE_OTHER): Payer: Self-pay | Admitting: *Deleted

## 2009-12-09 ENCOUNTER — Ambulatory Visit: Payer: Self-pay | Admitting: Oncology

## 2009-12-13 LAB — CBC WITH DIFFERENTIAL/PLATELET
Basophils Absolute: 0.1 10*3/uL (ref 0.0–0.1)
Eosinophils Absolute: 0.4 10*3/uL (ref 0.0–0.5)
HGB: 8.4 g/dL — ABNORMAL LOW (ref 11.6–15.9)
LYMPH%: 26.9 % (ref 14.0–49.7)
MCV: 107.9 fL — ABNORMAL HIGH (ref 79.5–101.0)
MONO#: 1.9 10*3/uL — ABNORMAL HIGH (ref 0.1–0.9)
NEUT#: 5.6 10*3/uL (ref 1.5–6.5)
Platelets: 463 10*3/uL — ABNORMAL HIGH (ref 145–400)
RBC: 2.53 10*6/uL — ABNORMAL LOW (ref 3.70–5.45)
RDW: 21.4 % — ABNORMAL HIGH (ref 11.2–14.5)
WBC: 11 10*3/uL — ABNORMAL HIGH (ref 3.9–10.3)
nRBC: 1 % — ABNORMAL HIGH (ref 0–0)

## 2009-12-13 LAB — COMPREHENSIVE METABOLIC PANEL
ALT: 20 U/L (ref 0–35)
AST: 28 U/L (ref 0–37)
Albumin: 4.1 g/dL (ref 3.5–5.2)
CO2: 21 mEq/L (ref 19–32)
Calcium: 8.9 mg/dL (ref 8.4–10.5)
Chloride: 107 mEq/L (ref 96–112)
Potassium: 4.2 mEq/L (ref 3.5–5.3)

## 2009-12-13 LAB — FERRITIN: Ferritin: 27 ng/mL (ref 10–291)

## 2009-12-13 LAB — TECHNOLOGIST REVIEW

## 2010-01-20 ENCOUNTER — Ambulatory Visit: Payer: Self-pay | Admitting: Oncology

## 2010-01-24 LAB — CBC WITH DIFFERENTIAL/PLATELET
BASO%: 0.9 % (ref 0.0–2.0)
Basophils Absolute: 0.1 10*3/uL (ref 0.0–0.1)
EOS%: 4.5 % (ref 0.0–7.0)
HCT: 28.4 % — ABNORMAL LOW (ref 34.8–46.6)
HGB: 8.9 g/dL — ABNORMAL LOW (ref 11.6–15.9)
LYMPH%: 26.8 % (ref 14.0–49.7)
MCH: 33.6 pg (ref 25.1–34.0)
MCHC: 31.3 g/dL — ABNORMAL LOW (ref 31.5–36.0)
MCV: 107.2 fL — ABNORMAL HIGH (ref 79.5–101.0)
MONO%: 19.7 % — ABNORMAL HIGH (ref 0.0–14.0)
NEUT%: 48.1 % (ref 38.4–76.8)
Platelets: 542 10*3/uL — ABNORMAL HIGH (ref 145–400)

## 2010-01-24 LAB — COMPREHENSIVE METABOLIC PANEL
AST: 17 U/L (ref 0–37)
Albumin: 4.1 g/dL (ref 3.5–5.2)
Alkaline Phosphatase: 87 U/L (ref 39–117)
BUN: 19 mg/dL (ref 6–23)
Creatinine, Ser: 0.83 mg/dL (ref 0.40–1.20)
Glucose, Bld: 73 mg/dL (ref 70–99)
Potassium: 4.5 mEq/L (ref 3.5–5.3)

## 2010-01-26 HISTORY — PX: MITRAL VALVE REPAIR: SHX2039

## 2010-04-25 ENCOUNTER — Ambulatory Visit: Payer: Self-pay | Admitting: Oncology

## 2010-04-27 ENCOUNTER — Encounter: Payer: Self-pay | Admitting: Pulmonary Disease

## 2010-04-27 LAB — RETICULOCYTES (CHCC)
ABS Retic: 430.9 10*3/uL — ABNORMAL HIGH (ref 19.0–186.0)
Retic Ct Pct: 17.1 % — ABNORMAL HIGH (ref 0.4–3.1)

## 2010-04-27 LAB — URINALYSIS, MICROSCOPIC - CHCC
Bilirubin (Urine): NEGATIVE
Glucose: NEGATIVE g/dL
Leukocyte Esterase: NEGATIVE
Nitrite: NEGATIVE
Specific Gravity, Urine: 1.015 (ref 1.003–1.035)
WBC, UA: NEGATIVE (ref 0–2)

## 2010-04-27 LAB — RETICULOCYTES

## 2010-05-09 ENCOUNTER — Ambulatory Visit: Payer: Self-pay | Admitting: Pulmonary Disease

## 2010-05-28 DIAGNOSIS — R55 Syncope and collapse: Secondary | ICD-10-CM

## 2010-05-28 HISTORY — DX: Syncope and collapse: R55

## 2010-06-18 ENCOUNTER — Encounter: Payer: Self-pay | Admitting: Thoracic Surgery (Cardiothoracic Vascular Surgery)

## 2010-06-18 ENCOUNTER — Encounter: Payer: Self-pay | Admitting: Internal Medicine

## 2010-06-27 NOTE — Assessment & Plan Note (Signed)
Summary: rov for asthma, dyspnea   Copy to:  [place] Primary Provider/Referring Provider:  Marden Noble  CC:  Pt is here for a 2 month f/u appt.  Pt states her breathing has improved since switching from Advair to Symbicort.  Pt states she still has sob with exertion and with heat and humidity.   Pt states she was hosp at [facility] in May d/t renal failure.  Marland Kitchen  History of Present Illness: the pt comes in today for f/u of her asthma and doe.  Since the last visit, she has had a ct chest which did not show any bronchiectasis, and her serum immunoglobulins were normal except for IgM being low (significance of this unknown).  She has had issues with ongoing cough, URI's and lower respiratory infections that I suspect are related to her chronic sinus disease, but changed her advair to symbicort as a trial since the former is sometimes associated with an increased incidence of infections on rare occasions.  She comes in today where she is doing well, and feels that she is breathing better with the symbicort.  I suspect this is due to the rapid onset of the foradil component in the inhaler.  She continues to have doe, but feels it is stable.  She denies any recent infection, but was in the hospital recently with renal failure?  Will obtain records.  Current Medications (verified): 1)  Folic Acid 1 Mg Tabs (Folic Acid) .... Once Daily 2)  Exjade 500 Mg Tbso (Deferasirox) .... 3 Tablet Once Daily 3)  Calcium-Vitamin D 250-125 Mg-Unit Tabs (Calcium Carbonate-Vitamin D) .... Once Daily 4)  Vitamin D3 400 Unit Tabs (Cholecalciferol) .... Take 1 Tablet By Mouth Once A Day 5)  Ventolin Hfa 108 (90 Base) Mcg/act Aers (Albuterol Sulfate) .Marland Kitchen.. 1 Puff As Needed 6)  Klor-Con 20 Meq Pack (Potassium Chloride) .... Two Times A Day 7)  Nasonex 50 Mcg/act Susp (Mometasone Furoate) .Marland Kitchen.. 1 Puff Each Nostril Once Daily 8)  Allegra 180 Mg Tabs (Fexofenadine Hcl) .... Once Daily 9)  Lasix 20 Mg Tabs (Furosemide) .... Take 1 Tab  By Mouth Once A Day.  Can Take 1/2 Tab At Bedtime As Needed For Swelling 10)  Ambien 10 Mg Tabs (Zolpidem Tartrate) .... At Bedtime As Needed 11)  Tramadol Hcl 50 Mg Tabs (Tramadol Hcl) .... Three Times A Day 12)  Cpap Machine 13)  Lopressor 50 Mg Tabs (Metoprolol Tartrate) .... Two Times A Day 14)  Nasal 0.65 % Soln (Saline) .... Use Two Times A Day As Needed 15)  Clonazepam 1 Mg Tabs (Clonazepam) .... As Needed 16)  Protonix 40 Mg Tbec (Pantoprazole Sodium) .... Take 1 Tablet By Mouth Once A Day 17)  Symbicort 160-4.5 Mcg/act  Aero (Budesonide-Formoterol Fumarate) .... Two Puffs Twice Daily  Allergies (verified): 1)  ! Iodine 2)  ! * Shellfish  Review of Systems       The patient complains of shortness of breath with activity and hand/feet swelling.  The patient denies shortness of breath at rest, productive cough, non-productive cough, coughing up blood, chest pain, irregular heartbeats, acid heartburn, indigestion, loss of appetite, weight change, abdominal pain, difficulty swallowing, sore throat, tooth/dental problems, headaches, nasal congestion/difficulty breathing through nose, sneezing, itching, ear ache, anxiety, depression, joint stiffness or pain, rash, change in color of mucus, and fever.    Vital Signs:  Patient profile:   60 year old female Height:      63 inches Weight:      164 pounds  BMI:     29.16 O2 Sat:      96 % on Room air Temp:     97.9 degrees F oral Pulse rate:   72 / minute BP sitting:   122 / 80  (left arm) Cuff size:   regular  Vitals Entered By: Arman Filter LPN (November 02, 1608 10:29 AM)  O2 Flow:  Room air CC: Pt is here for a 2 month f/u appt.  Pt states her breathing has improved since switching from Advair to Symbicort.  Pt states she still has sob with exertion and with heat and humidity.   Pt states she was hosp at [facility] in May d/t renal failure.   Comments Medications reviewed with patient Arman Filter LPN  November 03, 9602 10:29 AM     Physical Exam  General:  ow female in nad Nose:  no purulence or discharge noted. Lungs:  clear to auscultation Heart:  rrr, no rubs or gallops Extremities:  no edema or cyanosis Neurologic:  alert and oriented, moves all 4.   Impression & Recommendations:  Problem # 1:  INTRINSIC ASTHMA, UNSPECIFIED (ICD-493.10) the pt is doing well from an asthma standpoint, and there is nothing to suggest this has anything to do with her current breathing issues.  She has no obstruction on her recent pfts.  I have asked her to continue on her current bronchodilator regimen.  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  Medications Added to Medication List This Visit: 1)  Vitamin D3 400 Unit Tabs (Cholecalciferol) .... Take 1 tablet by mouth once a day 2)  Lasix 20 Mg Tabs (Furosemide) .... Take 1 tab by mouth once a day.  can take 1/2 tab at bedtime as needed for swelling 3)  Cpap Machine  4)  Nasal 0.65 % Soln (Saline) .... Use two times a day as needed  Other Orders: Est. Patient Level III (54098)  Patient Instructions: 1)  no change in meds. 2)  work on weight loss and exercise. 3)  followup with me in 6mos or sooner if problems.   Immunization History:  Influenza Immunization History:    Influenza:  historical (02/25/2009)  Pneumovax Immunization History:    Pneumovax:  historical (05/28/2006)

## 2010-06-27 NOTE — Assessment & Plan Note (Signed)
Summary: consult for asthma/dyspnea   Copy to:  Marden Noble Primary Provider/Referring Provider:  Marden Noble  CC:  Pulmonary Consult.  History of Present Illness: The pt is a 60y/o female who I have been asked to see for asthma.  She has a h/o asthma since her early 20's, and has had no issues since that time until most recently.  She is s/p MV repair last Feb, and feels that she has had peristent sob since that time.  She is currently describing a one block doe on flat ground, and will get winded bringing groceries in from the car.  She is unable to carry her grandson without getting winded.  She denies chronic cough, and only has issues when "sick".  She has had hoarseness on and off for years.  She developed URI symptoms 2 weeks ago, and has had "4 episodes this winter".  It usually starts with nasal symptoms, then postnasal drip, then cough with purulence and from nares.  She does have a h/o sinus infections, but denies reflux symptoms.  The pt does have osa and wears cpap, but has kept up with regular maintenance on her machine.  Her last cxr was this month, and was clear except mild elevation of right HD.  Has had simple spirometry with the results unavailable at present.    Preventive Screening-Counseling & Management  Alcohol-Tobacco     Smoking Status: never  Current Medications (verified): 1)  Folic Acid 1 Mg Tabs (Folic Acid) .... Once Daily 2)  Exjade 500 Mg Tbso (Deferasirox) .... 3 Tablet Once Daily 3)  Calcium-Vitamin D 250-125 Mg-Unit Tabs (Calcium Carbonate-Vitamin D) .... Once Daily 4)  Vitamin D 400 Unit Caps (Cholecalciferol) .... Once Daily 5)  Ventolin Hfa 108 (90 Base) Mcg/act Aers (Albuterol Sulfate) .Marland Kitchen.. 1 Puff As Needed 6)  Klor-Con 20 Meq Pack (Potassium Chloride) .... Two Times A Day 7)  Nasonex 50 Mcg/act Susp (Mometasone Furoate) .... 2 Puffs Each Nostril Once Daily 8)  Allegra 180 Mg Tabs (Fexofenadine Hcl) .... Once Daily 9)  Advair Diskus 250-50 Mcg/dose  Aepb (Fluticasone-Salmeterol) .Marland Kitchen.. 1 Puff Two Times A Day 10)  Lasix 20 Mg Tabs (Furosemide) .... Take 1 Tablet By Mouth Two Times A Day 11)  Ambien 10 Mg Tabs (Zolpidem Tartrate) .... At Bedtime As Needed 12)  Tramadol Hcl 50 Mg Tabs (Tramadol Hcl) .... Three Times A Day 13)  Cpap 14)  Lopressor 50 Mg Tabs (Metoprolol Tartrate) .... Two Times A Day 15)  Warfarin Sodium 2 Mg Tabs (Warfarin Sodium) .... Take As Directed 16)  Nasal 0.65 % Soln (Saline) .... Use Two Times A Day 17)  Clonazepam 1 Mg Tabs (Clonazepam) .... As Needed 18)  Levaquin  Allergies (verified): 1)  ! Iodine 2)  ! * Shellfish  Past History:  Past Medical History:  OBSTRUCTIVE SLEEP APNEA (ICD-327.23) ALLERGIC RHINITIS (ICD-477.9) GERD (ICD-530.81) HYPERTENSION (ICD-401.9) ATRIAL FIBRILLATION, PAROXYSMAL (ICD-427.31)    Past Surgical History: splenectomy 1953 cholecystectomy 1993 mitral valve repair 06/2008 ablation 01/2008  Family History: Reviewed history and no changes required. allergies: siblings cancer: mother (breast), father (melanoma)   Social History: Reviewed history and no changes required. Patient never smoked.  pt is married. pt has children. pt is currently on disability.  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  The patient denies coughing up blood, chest pain, irregular heartbeats, acid heartburn, indigestion, loss of appetite, abdominal pain, difficulty swallowing, sore throat, tooth/dental problems, headaches, itching, ear ache, depression, hand/feet swelling, joint stiffness or pain, rash, change in color of mucus, and fever.    Vital Signs:  Patient profile:   60 year old female Height:      62 inches Weight:       156.13 pounds BMI:     28.66 O2 Sat:      93 % on Room air Temp:     98.2 degrees F oral Pulse rate:   73 / minute BP sitting:   110 / 72  (left arm) Cuff size:   regular  Vitals Entered By: Arman Filter LPN (August 03, 8117 10:09 AM)  O2 Flow:  Room air CC: Pulmonary Consult Comments Medications reviewed with patient Arman Filter LPN  August 03, 1476 10:09 AM    Physical Exam  General:  wd female in nad Eyes:  PERRLA and EOMI.   Nose:  purulence noted in right nare, left clear Mouth:  no exudates or other abnl. Neck:  no jvd,  tmg, LN Lungs:  mild right basilar crackles, no wheezing good bs throughout. Heart:  rrr, 2/6 sem Abdomen:  soft and nontender, bs+ Extremities:  no edema  noted, pulses intact distally no cyanosis Neurologic:  alert and oriented, moves all 4.   Impression & Recommendations:  Problem # 1:  COUGH (ICD-786.2) the pt is having recurrent upper and lower respiratory infections that I suspect is coming more from her upper airway.  She has a h/o recurrent sinus infections, and I am wondering if chronic sinusitis is driving her whole symptomatology.  I am hesitant to blame her symptoms on asthma, since she is on a very good regimen for this.  Will evaluate her sinuses with ct.  The other possibility is whether advair is contributing to upper airway irritation/instability.  Problem # 2:  DYSPNEA (ICD-786.05) it is unclear if the pt even has asthma, or if she may have RAD related to ongoing sinus disease.  Her right hemidiaphragm is clearly elevated, but is much better.  This may be contributing.  Will do full pfts with lung volumes to evaluate this.  Will also get some idea as to whether she may have persistent airflow obstruction.  Medications Added to Medication List This Visit: 1)  Lasix 20 Mg Tabs (Furosemide) .... Take 1 tablet by mouth two times a day 2)  Warfarin Sodium 2 Mg Tabs (Warfarin sodium) .... Take as directed 3)  Nasal 0.65 % Soln (Saline)  .... Use two times a day 4)  Clonazepam 1 Mg Tabs (Clonazepam) .... As needed 5)  Levaquin   Other Orders: Consultation Level IV (29562) Pulmonary Referral (Pulmonary) Radiology Referral (Radiology)  Patient Instructions: 1)  continue on current meds for now. 2)  will schedule for ct sinuses to exclude chronic sinus disease.  I will let you know the results 3)  will schedule for full breathing tests to evaluate your shortness of breath.

## 2010-06-27 NOTE — Miscellaneous (Signed)
Summary: Orders Update pft charges  Clinical Lists Changes  Orders: Added new Service order of Carbon Monoxide diffusing w/capacity (94720) - Signed Added new Service order of Lung Volumes (94240) - Signed Added new Service order of Spirometry (Pre & Post) (94060) - Signed 

## 2010-06-27 NOTE — Letter (Signed)
Summary: [facility]  [address]   [place], [place] [address]   Phone: [phone]  Fax: [phone]    08/17/2009  Mica Burak [address] [place], [place]  [address]  Dear Ms. Venning,  We have attempted to contact you by phone but have been unable to reach you.  Please call our office at your earliest convenience so that we may discuss your test results with you.            Sincerely,   Marcelyn Bruins, M.D.

## 2010-06-27 NOTE — Progress Notes (Signed)
  Phone Note Other Incoming   Request: Send information Summary of Call: Request for records received from [facility].  Request forwarded to [facility].

## 2010-06-27 NOTE — Assessment & Plan Note (Signed)
Summary: rov for dyspnea, cough, recurrent infections.   Copy to:  Tressie Stalker Primary Provider/Referring Provider:  Marden Noble  CC:  Pt is here for a f/u appt to discuss PFT results.   Pt was recently at [facility] from 3-17 to 3-23 d/t "bacterial pna." .  History of Present Illness: the pt comes in today for f/u of her pfts as part of a w/u for dyspnea.  She was recently hospitalized with fever, increased wbc's, and RLL infiltrate all of which most c/w pna.  She also had interstitial edema on xray that improved by her cxr on d/c date.  Her pfts today show no obstruction by spiromety, moderate restriction, very minimal airtrapping, and a dlco that is severely reduced but corrects to normal with alveolar volume adjustment. I have reviewed all xrays while in hospital, and also went over her pfts in detail with her.  Currently feeling fairly well.  No significant cough or congestion.  She has an apptm with ENT to f/u on her sinuses.  Current Medications (verified): 1)  Folic Acid 1 Mg Tabs (Folic Acid) .... Once Daily 2)  Exjade 500 Mg Tbso (Deferasirox) .... 3 Tablet Once Daily 3)  Calcium-Vitamin D 250-125 Mg-Unit Tabs (Calcium Carbonate-Vitamin D) .... Once Daily 4)  Vitamin D 400 Unit Caps (Cholecalciferol) .... Once Daily 5)  Ventolin Hfa 108 (90 Base) Mcg/act Aers (Albuterol Sulfate) .Marland Kitchen.. 1 Puff As Needed 6)  Klor-Con 20 Meq Pack (Potassium Chloride) .... Two Times A Day 7)  Nasonex 50 Mcg/act Susp (Mometasone Furoate) .Marland Kitchen.. 1 Puff Each Nostril Once Daily 8)  Allegra 180 Mg Tabs (Fexofenadine Hcl) .... Once Daily 9)  Advair Diskus 250-50 Mcg/dose Aepb (Fluticasone-Salmeterol) .Marland Kitchen.. 1 Puff Two Times A Day 10)  Lasix 20 Mg Tabs (Furosemide) .... Take 1 Tablet By Mouth Two Times A Day 11)  Ambien 10 Mg Tabs (Zolpidem Tartrate) .... At Bedtime As Needed 12)  Tramadol Hcl 50 Mg Tabs (Tramadol Hcl) .... Three Times A Day 13)  Cpap 14)  Lopressor 50 Mg Tabs (Metoprolol Tartrate) .... Two Times A  Day 15)  Warfarin Sodium 2 Mg Tabs (Warfarin Sodium) .... Take As Directed 16)  Nasal 0.65 % Soln (Saline) .... Use Two Times A Day 17)  Clonazepam 1 Mg Tabs (Clonazepam) .... As Needed 18)  Protonix 40 Mg Tbec (Pantoprazole Sodium) .... Take 1 Tablet By Mouth Once A Day  Allergies (verified): 1)  ! Iodine 2)  ! * Shellfish  Review of Systems      See HPI  Vital Signs:  Patient profile:   60 year old female Height:      63 inches Weight:      160 pounds BMI:     28.45 O2 Sat:      98 % on Room air Temp:     98.1 degrees F oral Pulse rate:   73 / minute BP sitting:   116 / 70  (left arm) Cuff size:   regular  Vitals Entered By: Arman Filter LPN (September 02, 1608 11:45 AM)  O2 Flow:  Room air  Physical Exam  General:  ow female in nad Nose:  no purulence noted. Lungs:  minimal crackles right base, no wheezing or rhonchi Heart:  rrr, no mrg Extremities:  no edema or cyanosis Neurologic:  alert and oriented,  moves all 4.   Impression & Recommendations:  Problem # 1:  DYSPNEA (ICD-786.05) the pt has no significant airway obstruction by her pfts, and therefore unlikely  her asthma is contributing to her sob.  She has at least moderate restriction with no evidence for ISLD, therefore this is most likely due to her right diaphragm dysfunction and some degree of compression by centripetal adiposity.  I suspect the former is the primary issue.  I have asked her to work aggressively on weight loss and some type of conditioning program.  There is nothing specifically to treat her diaphragm dysfunction.  Time spent today reviewing data and outlining treatment with pt was .  Problem # 2:  COUGH (ICD-786.2) the pt has a lot of intermittant cough, URI's, and lower airway infections.  I suspect a lot of this is due to her chronic sinus disease, but would also like to evaluate for bronchiectasis and immunodef.  Will check high resolution ct chest, and also check serum Ig's.  My only  other thought is whether advair could be contributing to her infections?  Will try her on symbicort for a short period to see how she does.  Medications Added to Medication List This Visit: 1)  Nasonex 50 Mcg/act Susp (Mometasone furoate) .Marland Kitchen.. 1 puff each nostril once daily 2)  Protonix 40 Mg Tbec (Pantoprazole sodium) .... Take 1 tablet by mouth once a day 3)  Symbicort 160-4.5 Mcg/act Aero (Budesonide-formoterol fumarate) .... Two puffs twice daily  Other Orders: Est. Patient Level IV (94854) T- * Misc. Laboratory test [phone]) Radiology Referral (Radiology)  Patient Instructions: 1)  will change advair to symbicort 160/4.5  2 inhalations am and pm...rinse mouth well, gargle, spit.  This is to see if advair is irritating upper airway, and possibly causing infections? 2)  will check you immunoglobulin levels today 3)  will check scan of your chest for bronchiectasis 4)  work on conditioning and weight loss 5)  followup with me in 2mos, but call if issues before then.  Prescriptions: SYMBICORT 160-4.5 MCG/ACT  AERO (BUDESONIDE-FORMOTEROL FUMARATE) Two puffs twice daily  #1 x 6   Entered and Authorized by:   Barbaraann Share MD   Signed by:   Barbaraann Share MD on 09/02/2009   Method used:   Print then Give to Patient   RxID:   [id number]

## 2010-06-27 NOTE — Letter (Signed)
Summary: [facility]  [facility]   Imported By: Lanelle Bal 08/22/2009 13:09:25  _____________________________________________________________________  External Attachment:    Type:   Image     Comment:   External Document

## 2010-06-27 NOTE — Progress Notes (Signed)
Summary: returned call  Phone Note Call from Patient Call back at Home Phone [phone]   Caller: Patient Call For: Oluchi Pucci Summary of Call: Returning Megan's call. Initial call taken by: Darletta Moll,  September 15, 2009 9:43 AM  Follow-up for Phone Call        pt aware of results of CT and labs per append.Carron Curie CMA  September 15, 2009 10:27 AM

## 2010-06-27 NOTE — Consult Note (Signed)
Summary: [facility]  [facility]   Imported By: Lester [place] 11/24/2009 10:23:10  _____________________________________________________________________  External Attachment:    Type:   Image     Comment:   External Document

## 2010-07-24 ENCOUNTER — Encounter: Payer: Self-pay | Admitting: Thoracic Surgery (Cardiothoracic Vascular Surgery)

## 2010-07-27 ENCOUNTER — Encounter (HOSPITAL_BASED_OUTPATIENT_CLINIC_OR_DEPARTMENT_OTHER): Payer: BC Managed Care – PPO | Admitting: Oncology

## 2010-07-27 ENCOUNTER — Other Ambulatory Visit: Payer: Self-pay | Admitting: Oncology

## 2010-07-27 DIAGNOSIS — R609 Edema, unspecified: Secondary | ICD-10-CM

## 2010-07-27 DIAGNOSIS — D649 Anemia, unspecified: Secondary | ICD-10-CM

## 2010-07-27 DIAGNOSIS — D559 Anemia due to enzyme disorder, unspecified: Secondary | ICD-10-CM

## 2010-07-27 LAB — CBC WITH DIFFERENTIAL/PLATELET
BASO%: 0.4 % (ref 0.0–2.0)
Eosinophils Absolute: 0.3 10*3/uL (ref 0.0–0.5)
MCHC: 31.6 g/dL (ref 31.5–36.0)
MONO#: 2.3 10*3/uL — ABNORMAL HIGH (ref 0.1–0.9)
NEUT#: 7.2 10*3/uL — ABNORMAL HIGH (ref 1.5–6.5)
RBC: 2.78 10*6/uL — ABNORMAL LOW (ref 3.70–5.45)
WBC: 11.9 10*3/uL — ABNORMAL HIGH (ref 3.9–10.3)
lymph#: 2 10*3/uL (ref 0.9–3.3)

## 2010-07-27 LAB — COMPREHENSIVE METABOLIC PANEL
ALT: 13 U/L (ref 0–35)
AST: 21 U/L (ref 0–37)
Albumin: 4.4 g/dL (ref 3.5–5.2)
Calcium: 9.9 mg/dL (ref 8.4–10.5)
Chloride: 97 mEq/L (ref 96–112)
Potassium: 3.4 mEq/L — ABNORMAL LOW (ref 3.5–5.3)
Sodium: 133 mEq/L — ABNORMAL LOW (ref 135–145)
Total Protein: 8 g/dL (ref 6.0–8.3)

## 2010-07-27 LAB — TECHNOLOGIST REVIEW

## 2010-07-31 ENCOUNTER — Encounter: Payer: Self-pay | Admitting: Thoracic Surgery (Cardiothoracic Vascular Surgery)

## 2010-08-08 NOTE — Letter (Signed)
Summary: [facility]  [facility]   Imported By: Lester [place] 05/10/2010 09:01:19  _____________________________________________________________________  External Attachment:    Type:   Image     Comment:   External Document

## 2010-08-15 LAB — CBC
HCT: 19 % — ABNORMAL LOW (ref 36.0–46.0)
HCT: 28.1 % — ABNORMAL LOW (ref 36.0–46.0)
Hemoglobin: 6.3 g/dL — CL (ref 12.0–15.0)
Hemoglobin: 7.9 g/dL — ABNORMAL LOW (ref 12.0–15.0)
Hemoglobin: 9.1 g/dL — ABNORMAL LOW (ref 12.0–15.0)
Hemoglobin: 9.2 g/dL — ABNORMAL LOW (ref 12.0–15.0)
MCHC: 33.9 g/dL (ref 30.0–36.0)
MCV: 99.6 fL (ref 78.0–100.0)
Platelets: 408 10*3/uL — ABNORMAL HIGH (ref 150–400)
Platelets: 470 10*3/uL — ABNORMAL HIGH (ref 150–400)
Platelets: 504 10*3/uL — ABNORMAL HIGH (ref 150–400)
Platelets: 603 10*3/uL — ABNORMAL HIGH (ref 150–400)
RDW: 17 % — ABNORMAL HIGH (ref 11.5–15.5)
RDW: 17.3 % — ABNORMAL HIGH (ref 11.5–15.5)
RDW: 17.5 % — ABNORMAL HIGH (ref 11.5–15.5)
RDW: 17.6 % — ABNORMAL HIGH (ref 11.5–15.5)
WBC: 9.4 10*3/uL (ref 4.0–10.5)

## 2010-08-15 LAB — COMPREHENSIVE METABOLIC PANEL
ALT: 66 U/L — ABNORMAL HIGH (ref 0–35)
ALT: 74 U/L — ABNORMAL HIGH (ref 0–35)
ALT: 81 U/L — ABNORMAL HIGH (ref 0–35)
AST: 79 U/L — ABNORMAL HIGH (ref 0–37)
Albumin: 3.2 g/dL — ABNORMAL LOW (ref 3.5–5.2)
Albumin: 3.8 g/dL (ref 3.5–5.2)
Alkaline Phosphatase: 80 U/L (ref 39–117)
Alkaline Phosphatase: 83 U/L (ref 39–117)
Alkaline Phosphatase: 85 U/L (ref 39–117)
BUN: 26 mg/dL — ABNORMAL HIGH (ref 6–23)
BUN: 31 mg/dL — ABNORMAL HIGH (ref 6–23)
BUN: 39 mg/dL — ABNORMAL HIGH (ref 6–23)
CO2: 15 mEq/L — ABNORMAL LOW (ref 19–32)
CO2: 16 mEq/L — ABNORMAL LOW (ref 19–32)
Calcium: 8.5 mg/dL (ref 8.4–10.5)
Chloride: 115 mEq/L — ABNORMAL HIGH (ref 96–112)
Creatinine, Ser: 3.1 mg/dL — ABNORMAL HIGH (ref 0.4–1.2)
GFR calc non Af Amer: 18 mL/min — ABNORMAL LOW (ref >60)
GFR calc non Af Amer: 27 mL/min — ABNORMAL LOW (ref >60)
GFR calc non Af Amer: 39 mL/min — ABNORMAL LOW (ref >60)
Glucose, Bld: 104 mg/dL — ABNORMAL HIGH (ref 70–99)
Glucose, Bld: 92 mg/dL (ref 70–99)
Glucose, Bld: 98 mg/dL (ref 70–99)
Potassium: 3 mEq/L — ABNORMAL LOW (ref 3.5–5.1)
Potassium: 3.7 mEq/L (ref 3.5–5.1)
Potassium: 3.7 mEq/L (ref 3.5–5.1)
Sodium: 137 mEq/L (ref 135–145)
Sodium: 139 mEq/L (ref 135–145)
Sodium: 140 mEq/L (ref 135–145)
Total Bilirubin: 2.7 mg/dL — ABNORMAL HIGH (ref 0.3–1.2)
Total Bilirubin: 3.1 mg/dL — ABNORMAL HIGH (ref 0.3–1.2)
Total Protein: 6.2 g/dL (ref 6.0–8.3)
Total Protein: 7.2 g/dL (ref 6.0–8.3)
Total Protein: 7.3 g/dL (ref 6.0–8.3)

## 2010-08-15 LAB — URINALYSIS, ROUTINE W REFLEX MICROSCOPIC
Glucose, UA: NEGATIVE mg/dL
Ketones, ur: 15 mg/dL — AB
Leukocytes, UA: NEGATIVE
Nitrite: NEGATIVE
Specific Gravity, Urine: 1.017 (ref 1.005–1.030)
pH: 5 (ref 5.0–8.0)

## 2010-08-15 LAB — CROSSMATCH
ABO/RH(D): O POS
Antibody Screen: NEGATIVE

## 2010-08-15 LAB — BASIC METABOLIC PANEL
BUN: 10 mg/dL (ref 6–23)
BUN: 12 mg/dL (ref 6–23)
BUN: 21 mg/dL (ref 6–23)
CO2: 22 mEq/L (ref 19–32)
CO2: 25 mEq/L (ref 19–32)
Calcium: 8.4 mg/dL (ref 8.4–10.5)
Calcium: 8.7 mg/dL (ref 8.4–10.5)
Calcium: 9 mg/dL (ref 8.4–10.5)
Chloride: 109 mEq/L (ref 96–112)
Creatinine, Ser: 0.85 mg/dL (ref 0.4–1.2)
Creatinine, Ser: 1.03 mg/dL (ref 0.4–1.2)
GFR calc Af Amer: >60 mL/min (ref >60)
GFR calc non Af Amer: 35 mL/min — ABNORMAL LOW (ref >60)
GFR calc non Af Amer: 46 mL/min — ABNORMAL LOW (ref >60)
GFR calc non Af Amer: 54 mL/min — ABNORMAL LOW (ref >60)
Glucose, Bld: 103 mg/dL — ABNORMAL HIGH (ref 70–99)
Glucose, Bld: 104 mg/dL — ABNORMAL HIGH (ref 70–99)
Glucose, Bld: 105 mg/dL — ABNORMAL HIGH (ref 70–99)
Glucose, Bld: 94 mg/dL (ref 70–99)
Potassium: 3.1 mEq/L — ABNORMAL LOW (ref 3.5–5.1)
Sodium: 135 mEq/L (ref 135–145)
Sodium: 138 mEq/L (ref 135–145)

## 2010-08-15 LAB — URINE MICROSCOPIC-ADD ON

## 2010-08-15 LAB — PROTIME-INR
INR: 1.44 (ref 0.00–1.49)
INR: 1.46 (ref 0.00–1.49)
INR: 1.91 — ABNORMAL HIGH (ref 0.00–1.49)
INR: 4.24 — ABNORMAL HIGH (ref 0.00–1.49)
INR: 5.98 (ref 0.00–1.49)
Prothrombin Time: 17.6 seconds — ABNORMAL HIGH (ref 11.6–15.2)
Prothrombin Time: 25 seconds — ABNORMAL HIGH (ref 11.6–15.2)
Prothrombin Time: 40.5 seconds — ABNORMAL HIGH (ref 11.6–15.2)
Prothrombin Time: 53 seconds — ABNORMAL HIGH (ref 11.6–15.2)
Prothrombin Time: 54.7 seconds — ABNORMAL HIGH (ref 11.6–15.2)

## 2010-08-15 LAB — MAGNESIUM: Magnesium: 3 mg/dL — ABNORMAL HIGH (ref 1.5–2.5)

## 2010-08-15 LAB — POTASSIUM: Potassium: 3.3 mEq/L — ABNORMAL LOW (ref 3.5–5.1)

## 2010-08-15 LAB — DIFFERENTIAL
Basophils Absolute: 0 10*3/uL (ref 0.0–0.1)
Basophils Absolute: 0.1 10*3/uL (ref 0.0–0.1)
Basophils Relative: 1 % (ref 0–1)
Eosinophils Absolute: 0.2 10*3/uL (ref 0.0–0.7)
Lymphocytes Relative: 16 % (ref 12–46)
Lymphocytes Relative: 25 % (ref 12–46)
Monocytes Absolute: 1.4 10*3/uL — ABNORMAL HIGH (ref 0.1–1.0)
Monocytes Relative: 22 % — ABNORMAL HIGH (ref 3–12)
Neutro Abs: 4.8 10*3/uL (ref 1.7–7.7)
Neutrophils Relative %: 51 % (ref 43–77)
Neutrophils Relative %: 64 % (ref 43–77)

## 2010-08-15 LAB — VITAMIN B12: Vitamin B-12: 401 pg/mL (ref 211–911)

## 2010-08-15 LAB — RETICULOCYTES
RBC.: 2.34 MIL/uL — ABNORMAL LOW (ref 3.87–5.11)
Retic Count, Absolute: 194.2 10*3/uL — ABNORMAL HIGH (ref 19.0–186.0)
Retic Ct Pct: 8.3 % — ABNORMAL HIGH (ref 0.4–3.1)

## 2010-08-15 LAB — URINE CULTURE

## 2010-08-15 LAB — HEPATIC FUNCTION PANEL
Bilirubin, Direct: 1 mg/dL — ABNORMAL HIGH (ref 0.0–0.3)
Indirect Bilirubin: 2.1 mg/dL — ABNORMAL HIGH (ref 0.3–0.9)
Total Bilirubin: 3.1 mg/dL — ABNORMAL HIGH (ref 0.3–1.2)

## 2010-08-15 LAB — RAPID URINE DRUG SCREEN, HOSP PERFORMED
Amphetamines: NOT DETECTED
Barbiturates: NOT DETECTED
Benzodiazepines: NOT DETECTED
Cocaine: NOT DETECTED
Opiates: NOT DETECTED

## 2010-08-15 LAB — PHOSPHORUS: Phosphorus: 4.6 mg/dL (ref 2.3–4.6)

## 2010-08-15 LAB — URIC ACID: Uric Acid, Serum: 8.3 mg/dL — ABNORMAL HIGH (ref 2.4–7.0)

## 2010-08-15 LAB — IRON AND TIBC
Iron: 372 ug/dL — ABNORMAL HIGH (ref 42–135)
UIBC: <55 ug/dL

## 2010-08-20 LAB — CBC
HCT: 25.1 % — ABNORMAL LOW (ref 36.0–46.0)
HCT: 25.4 % — ABNORMAL LOW (ref 36.0–46.0)
Hemoglobin: 6.5 g/dL — CL (ref 12.0–15.0)
Hemoglobin: 8 g/dL — ABNORMAL LOW (ref 12.0–15.0)
MCHC: 32 g/dL (ref 30.0–36.0)
MCHC: 33.1 g/dL (ref 30.0–36.0)
MCV: 115.3 fL — ABNORMAL HIGH (ref 78.0–100.0)
MCV: 116.6 fL — ABNORMAL HIGH (ref 78.0–100.0)
MCV: 116.8 fL — ABNORMAL HIGH (ref 78.0–100.0)
Platelets: 345 10*3/uL (ref 150–400)
Platelets: 408 10*3/uL — ABNORMAL HIGH (ref 150–400)
Platelets: 432 10*3/uL — ABNORMAL HIGH (ref 150–400)
RBC: 1.68 MIL/uL — ABNORMAL LOW (ref 3.87–5.11)
RBC: 1.83 MIL/uL — ABNORMAL LOW (ref 3.87–5.11)
RBC: 2.15 MIL/uL — ABNORMAL LOW (ref 3.87–5.11)
RDW: 17.6 % — ABNORMAL HIGH (ref 11.5–15.5)
RDW: 19.4 % — ABNORMAL HIGH (ref 11.5–15.5)
RDW: 20.1 % — ABNORMAL HIGH (ref 11.5–15.5)
WBC: 13.1 10*3/uL — ABNORMAL HIGH (ref 4.0–10.5)
WBC: 27.2 10*3/uL — ABNORMAL HIGH (ref 4.0–10.5)
WBC: 28.8 10*3/uL — ABNORMAL HIGH (ref 4.0–10.5)
WBC: 8.9 10*3/uL (ref 4.0–10.5)

## 2010-08-20 LAB — BASIC METABOLIC PANEL
BUN: 19 mg/dL (ref 6–23)
CO2: 25 mEq/L (ref 19–32)
Calcium: 8.5 mg/dL (ref 8.4–10.5)
Chloride: 106 mEq/L (ref 96–112)
Chloride: 107 mEq/L (ref 96–112)
Creatinine, Ser: 1.05 mg/dL (ref 0.4–1.2)
GFR calc Af Amer: >60 mL/min (ref >60)
GFR calc non Af Amer: 53 mL/min — ABNORMAL LOW (ref >60)
Potassium: 3.6 mEq/L (ref 3.5–5.1)

## 2010-08-20 LAB — PROTIME-INR
INR: 1.49 (ref 0.00–1.49)
INR: 2.08 — ABNORMAL HIGH (ref 0.00–1.49)
INR: 2.15 — ABNORMAL HIGH (ref 0.00–1.49)
INR: 2.31 — ABNORMAL HIGH (ref 0.00–1.49)
INR: 2.37 — ABNORMAL HIGH (ref 0.00–1.49)
Prothrombin Time: 17.9 s — ABNORMAL HIGH (ref 11.6–15.2)
Prothrombin Time: 23.1 seconds — ABNORMAL HIGH (ref 11.6–15.2)
Prothrombin Time: 23.2 seconds — ABNORMAL HIGH (ref 11.6–15.2)
Prothrombin Time: 23.8 seconds — ABNORMAL HIGH (ref 11.6–15.2)
Prothrombin Time: 25.2 seconds — ABNORMAL HIGH (ref 11.6–15.2)
Prothrombin Time: 25.7 seconds — ABNORMAL HIGH (ref 11.6–15.2)

## 2010-08-20 LAB — URINALYSIS, ROUTINE W REFLEX MICROSCOPIC
Bilirubin Urine: NEGATIVE
Glucose, UA: NEGATIVE mg/dL
Hgb urine dipstick: NEGATIVE
Ketones, ur: NEGATIVE mg/dL
Nitrite: NEGATIVE
Protein, ur: NEGATIVE mg/dL
Specific Gravity, Urine: 1.021 (ref 1.005–1.030)
Urobilinogen, UA: 0.2 mg/dL (ref 0.0–1.0)
pH: 5.5 (ref 5.0–8.0)

## 2010-08-20 LAB — COMPREHENSIVE METABOLIC PANEL
Albumin: 3.2 g/dL — ABNORMAL LOW (ref 3.5–5.2)
BUN: 14 mg/dL (ref 6–23)
Creatinine, Ser: 0.73 mg/dL (ref 0.4–1.2)
Total Protein: 6.5 g/dL (ref 6.0–8.3)

## 2010-08-20 LAB — CULTURE, BLOOD (ROUTINE X 2)

## 2010-08-20 LAB — DIFFERENTIAL
Basophils Absolute: 0.3 K/uL — ABNORMAL HIGH (ref 0.0–0.1)
Basophils Relative: 1 % (ref 0–1)
Eosinophils Absolute: 0 K/uL (ref 0.0–0.7)
Eosinophils Relative: 0 % (ref 0–5)
Lymphocytes Relative: 4 % — ABNORMAL LOW (ref 12–46)
Lymphs Abs: 1.1 K/uL (ref 0.7–4.0)
Monocytes Absolute: 2.2 K/uL — ABNORMAL HIGH (ref 0.1–1.0)
Monocytes Relative: 8 % (ref 3–12)
Neutro Abs: 23.6 10*3/uL — ABNORMAL HIGH (ref 1.7–7.7)
Neutrophils Relative %: 87 % — ABNORMAL HIGH (ref 43–77)

## 2010-08-20 LAB — BASIC METABOLIC PANEL WITH GFR
Calcium: 8.9 mg/dL (ref 8.4–10.5)
Creatinine, Ser: 0.68 mg/dL (ref 0.4–1.2)
GFR calc Af Amer: >60 mL/min (ref >60)
GFR calc non Af Amer: >60 mL/min (ref >60)
Glucose, Bld: 144 mg/dL — ABNORMAL HIGH (ref 70–99)
Sodium: 137 meq/L (ref 135–145)

## 2010-08-20 LAB — URINE MICROSCOPIC-ADD ON

## 2010-08-20 LAB — BRAIN NATRIURETIC PEPTIDE: Pro B Natriuretic peptide (BNP): 397 pg/mL — ABNORMAL HIGH (ref 0.0–100.0)

## 2010-09-07 LAB — BASIC METABOLIC PANEL
BUN: 10 mg/dL (ref 6–23)
BUN: 12 mg/dL (ref 6–23)
Calcium: 8.6 mg/dL (ref 8.4–10.5)
Creatinine, Ser: 0.64 mg/dL (ref 0.4–1.2)
Creatinine, Ser: 0.73 mg/dL (ref 0.4–1.2)
GFR calc Af Amer: >60 mL/min (ref >60)
GFR calc non Af Amer: >60 mL/min (ref >60)
Glucose, Bld: 99 mg/dL (ref 70–99)

## 2010-09-07 LAB — PROTIME-INR
INR: 2.4 — ABNORMAL HIGH (ref 0.00–1.49)
INR: 2.8 — ABNORMAL HIGH (ref 0.00–1.49)
Prothrombin Time: 28 seconds — ABNORMAL HIGH (ref 11.6–15.2)
Prothrombin Time: 31.3 seconds — ABNORMAL HIGH (ref 11.6–15.2)
Prothrombin Time: 31.6 seconds — ABNORMAL HIGH (ref 11.6–15.2)

## 2010-09-07 LAB — CBC
MCV: 99.4 fL (ref 78.0–100.0)
Platelets: 266 10*3/uL (ref 150–400)
RDW: 22.9 % — ABNORMAL HIGH (ref 11.5–15.5)
WBC: 14.6 10*3/uL — ABNORMAL HIGH (ref 4.0–10.5)

## 2010-09-11 ENCOUNTER — Encounter (INDEPENDENT_AMBULATORY_CARE_PROVIDER_SITE_OTHER): Payer: BC Managed Care – PPO | Admitting: Thoracic Surgery (Cardiothoracic Vascular Surgery)

## 2010-09-11 DIAGNOSIS — I4891 Unspecified atrial fibrillation: Secondary | ICD-10-CM

## 2010-09-11 DIAGNOSIS — I059 Rheumatic mitral valve disease, unspecified: Secondary | ICD-10-CM

## 2010-09-12 LAB — CBC
HCT: 28.4 % — ABNORMAL LOW (ref 36.0–46.0)
HCT: 30.1 % — ABNORMAL LOW (ref 36.0–46.0)
HCT: 31.8 % — ABNORMAL LOW (ref 36.0–46.0)
HCT: 32.6 % — ABNORMAL LOW (ref 36.0–46.0)
Hemoglobin: 10 g/dL — ABNORMAL LOW (ref 12.0–15.0)
Hemoglobin: 10.3 g/dL — ABNORMAL LOW (ref 12.0–15.0)
Hemoglobin: 10.6 g/dL — ABNORMAL LOW (ref 12.0–15.0)
Hemoglobin: 11 g/dL — ABNORMAL LOW (ref 12.0–15.0)
Hemoglobin: 11.4 g/dL — ABNORMAL LOW (ref 12.0–15.0)
Hemoglobin: 9.8 g/dL — ABNORMAL LOW (ref 12.0–15.0)
MCHC: 33.7 g/dL (ref 30.0–36.0)
MCHC: 34 g/dL (ref 30.0–36.0)
MCHC: 34.8 g/dL (ref 30.0–36.0)
MCHC: 35.1 g/dL (ref 30.0–36.0)
MCV: 101.4 fL — ABNORMAL HIGH (ref 78.0–100.0)
MCV: 112.1 fL — ABNORMAL HIGH (ref 78.0–100.0)
MCV: 98.1 fL (ref 78.0–100.0)
MCV: 99.3 fL (ref 78.0–100.0)
MCV: 99.4 fL (ref 78.0–100.0)
Platelets: 116 10*3/uL — ABNORMAL LOW (ref 150–400)
Platelets: 149 10*3/uL — ABNORMAL LOW (ref 150–400)
Platelets: 153 10*3/uL (ref 150–400)
Platelets: 566 10*3/uL — ABNORMAL HIGH (ref 150–400)
RBC: 2.83 MIL/uL — ABNORMAL LOW (ref 3.87–5.11)
RBC: 2.86 MIL/uL — ABNORMAL LOW (ref 3.87–5.11)
RBC: 2.89 MIL/uL — ABNORMAL LOW (ref 3.87–5.11)
RBC: 2.95 MIL/uL — ABNORMAL LOW (ref 3.87–5.11)
RBC: 3 MIL/uL — ABNORMAL LOW (ref 3.87–5.11)
RBC: 3.06 MIL/uL — ABNORMAL LOW (ref 3.87–5.11)
RDW: 25 % — ABNORMAL HIGH (ref 11.5–15.5)
RDW: 25.7 % — ABNORMAL HIGH (ref 11.5–15.5)
RDW: 26.1 % — ABNORMAL HIGH (ref 11.5–15.5)
RDW: 26.9 % — ABNORMAL HIGH (ref 11.5–15.5)
WBC: 19.9 10*3/uL — ABNORMAL HIGH (ref 4.0–10.5)
WBC: 21 10*3/uL — ABNORMAL HIGH (ref 4.0–10.5)
WBC: 21.7 10*3/uL — ABNORMAL HIGH (ref 4.0–10.5)
WBC: 8.2 10*3/uL (ref 4.0–10.5)

## 2010-09-12 LAB — GLUCOSE, CAPILLARY
Glucose-Capillary: 105 mg/dL — ABNORMAL HIGH (ref 70–99)
Glucose-Capillary: 120 mg/dL — ABNORMAL HIGH (ref 70–99)
Glucose-Capillary: 125 mg/dL — ABNORMAL HIGH (ref 70–99)
Glucose-Capillary: 127 mg/dL — ABNORMAL HIGH (ref 70–99)
Glucose-Capillary: 127 mg/dL — ABNORMAL HIGH (ref 70–99)
Glucose-Capillary: 130 mg/dL — ABNORMAL HIGH (ref 70–99)
Glucose-Capillary: 131 mg/dL — ABNORMAL HIGH (ref 70–99)
Glucose-Capillary: 90 mg/dL (ref 70–99)

## 2010-09-12 LAB — BASIC METABOLIC PANEL
BUN: 13 mg/dL (ref 6–23)
BUN: 13 mg/dL (ref 6–23)
CO2: 25 mEq/L (ref 19–32)
CO2: 35 mEq/L — ABNORMAL HIGH (ref 19–32)
Calcium: 8.5 mg/dL (ref 8.4–10.5)
Chloride: 106 mEq/L (ref 96–112)
Chloride: 112 mEq/L (ref 96–112)
Chloride: 94 mEq/L — ABNORMAL LOW (ref 96–112)
GFR calc Af Amer: >60 mL/min (ref >60)
GFR calc Af Amer: >60 mL/min (ref >60)
GFR calc Af Amer: >60 mL/min (ref >60)
GFR calc Af Amer: >60 mL/min (ref >60)
GFR calc Af Amer: >60 mL/min (ref >60)
GFR calc non Af Amer: >60 mL/min (ref >60)
GFR calc non Af Amer: >60 mL/min (ref >60)
GFR calc non Af Amer: >60 mL/min (ref >60)
Glucose, Bld: 123 mg/dL — ABNORMAL HIGH (ref 70–99)
Potassium: 2.7 mEq/L — CL (ref 3.5–5.1)
Potassium: 3.6 mEq/L (ref 3.5–5.1)
Potassium: 4.2 mEq/L (ref 3.5–5.1)
Sodium: 135 mEq/L (ref 135–145)
Sodium: 136 mEq/L (ref 135–145)
Sodium: 139 mEq/L (ref 135–145)
Sodium: 141 mEq/L (ref 135–145)

## 2010-09-12 LAB — BLOOD GAS, ARTERIAL
Acid-Base Excess: 3.1 mmol/L — ABNORMAL HIGH (ref 0.0–2.0)
Bicarbonate: 26.9 mEq/L — ABNORMAL HIGH (ref 20.0–24.0)
TCO2: 28.1 mmol/L (ref 0–100)
pCO2 arterial: 39.6 mmHg (ref 35.0–45.0)
pH, Arterial: 7.447 — ABNORMAL HIGH (ref 7.350–7.400)

## 2010-09-12 LAB — POCT I-STAT 4, (NA,K, GLUC, HGB,HCT)
Glucose, Bld: 102 mg/dL — ABNORMAL HIGH (ref 70–99)
Glucose, Bld: 110 mg/dL — ABNORMAL HIGH (ref 70–99)
Glucose, Bld: 119 mg/dL — ABNORMAL HIGH (ref 70–99)
Glucose, Bld: 159 mg/dL — ABNORMAL HIGH (ref 70–99)
Glucose, Bld: 78 mg/dL (ref 70–99)
HCT: 19 % — ABNORMAL LOW (ref 36.0–46.0)
HCT: 21 % — ABNORMAL LOW (ref 36.0–46.0)
HCT: 23 % — ABNORMAL LOW (ref 36.0–46.0)
HCT: 25 % — ABNORMAL LOW (ref 36.0–46.0)
HCT: 27 % — ABNORMAL LOW (ref 36.0–46.0)
HCT: 33 % — ABNORMAL LOW (ref 36.0–46.0)
Hemoglobin: 10.5 g/dL — ABNORMAL LOW (ref 12.0–15.0)
Hemoglobin: 11.2 g/dL — ABNORMAL LOW (ref 12.0–15.0)
Hemoglobin: 6.8 g/dL — CL (ref 12.0–15.0)
Hemoglobin: 7.5 g/dL — CL (ref 12.0–15.0)
Hemoglobin: 7.8 g/dL — CL (ref 12.0–15.0)
Hemoglobin: 8.5 g/dL — ABNORMAL LOW (ref 12.0–15.0)
Hemoglobin: 8.8 g/dL — ABNORMAL LOW (ref 12.0–15.0)
Potassium: 2.8 mEq/L — ABNORMAL LOW (ref 3.5–5.1)
Potassium: 3.3 mEq/L — ABNORMAL LOW (ref 3.5–5.1)
Potassium: 3.3 mEq/L — ABNORMAL LOW (ref 3.5–5.1)
Potassium: 3.6 mEq/L (ref 3.5–5.1)
Potassium: 3.7 mEq/L (ref 3.5–5.1)
Sodium: 133 mEq/L — ABNORMAL LOW (ref 135–145)
Sodium: 135 mEq/L (ref 135–145)
Sodium: 138 mEq/L (ref 135–145)
Sodium: 140 mEq/L (ref 135–145)
Sodium: 140 mEq/L (ref 135–145)
Sodium: 141 mEq/L (ref 135–145)

## 2010-09-12 LAB — POCT I-STAT, CHEM 8
BUN: 19 mg/dL (ref 6–23)
Calcium, Ion: 1.24 mmol/L (ref 1.12–1.32)
Chloride: 110 mEq/L (ref 96–112)
Chloride: 98 mEq/L (ref 96–112)
Creatinine, Ser: 1 mg/dL (ref 0.4–1.2)
Creatinine, Ser: 1 mg/dL (ref 0.4–1.2)
Creatinine, Ser: 1.2 mg/dL (ref 0.4–1.2)
Glucose, Bld: 133 mg/dL — ABNORMAL HIGH (ref 70–99)
HCT: 31 % — ABNORMAL LOW (ref 36.0–46.0)
HCT: 32 % — ABNORMAL LOW (ref 36.0–46.0)
Hemoglobin: 10.5 g/dL — ABNORMAL LOW (ref 12.0–15.0)
Potassium: 3.3 mEq/L — ABNORMAL LOW (ref 3.5–5.1)
Potassium: 4.1 mEq/L (ref 3.5–5.1)
Sodium: 140 mEq/L (ref 135–145)
Sodium: 143 mEq/L (ref 135–145)
TCO2: 25 mmol/L (ref 0–100)

## 2010-09-12 LAB — POCT I-STAT 3, ART BLOOD GAS (G3+)
Acid-Base Excess: 2 mmol/L (ref 0.0–2.0)
Acid-base deficit: 4 mmol/L — ABNORMAL HIGH (ref 0.0–2.0)
Acid-base deficit: 5 mmol/L — ABNORMAL HIGH (ref 0.0–2.0)
Bicarbonate: 20.6 mEq/L (ref 20.0–24.0)
Bicarbonate: 21 mEq/L (ref 20.0–24.0)
Bicarbonate: 24.9 mEq/L — ABNORMAL HIGH (ref 20.0–24.0)
Bicarbonate: 28 mEq/L — ABNORMAL HIGH (ref 20.0–24.0)
O2 Saturation: 100 %
O2 Saturation: 100 %
O2 Saturation: 100 %
O2 Saturation: 96 %
O2 Saturation: 98 %
O2 Saturation: 98 %
Patient temperature: 37.6
TCO2: 30 mmol/L (ref 0–100)
pCO2 arterial: 31.6 mmHg — ABNORMAL LOW (ref 35.0–45.0)
pCO2 arterial: 37.3 mmHg (ref 35.0–45.0)
pCO2 arterial: 38.1 mmHg (ref 35.0–45.0)
pCO2 arterial: 38.4 mmHg (ref 35.0–45.0)
pCO2 arterial: 38.7 mmHg (ref 35.0–45.0)
pCO2 arterial: 52.8 mmHg — ABNORMAL HIGH (ref 35.0–45.0)
pH, Arterial: 7.285 — ABNORMAL LOW (ref 7.350–7.400)
pH, Arterial: 7.433 — ABNORMAL HIGH (ref 7.350–7.400)
pH, Arterial: 7.454 — ABNORMAL HIGH (ref 7.350–7.400)
pO2, Arterial: 108 mmHg — ABNORMAL HIGH (ref 80.0–100.0)
pO2, Arterial: 126 mmHg — ABNORMAL HIGH (ref 80.0–100.0)
pO2, Arterial: 259 mmHg — ABNORMAL HIGH (ref 80.0–100.0)
pO2, Arterial: 91 mmHg (ref 80.0–100.0)

## 2010-09-12 LAB — APTT: aPTT: 30 seconds (ref 24–37)

## 2010-09-12 LAB — URINALYSIS, ROUTINE W REFLEX MICROSCOPIC
Bilirubin Urine: NEGATIVE
Hgb urine dipstick: NEGATIVE
Ketones, ur: NEGATIVE mg/dL
Nitrite: NEGATIVE
Specific Gravity, Urine: 1.021 (ref 1.005–1.030)
pH: 5.5 (ref 5.0–8.0)

## 2010-09-12 LAB — COMPREHENSIVE METABOLIC PANEL
AST: 26 U/L (ref 0–37)
Albumin: 4 g/dL (ref 3.5–5.2)
Alkaline Phosphatase: 99 U/L (ref 39–117)
BUN: 20 mg/dL (ref 6–23)
CO2: 25 mEq/L (ref 19–32)
Chloride: 104 mEq/L (ref 96–112)
Creatinine, Ser: 0.95 mg/dL (ref 0.4–1.2)
GFR calc non Af Amer: >60 mL/min (ref >60)
Potassium: 3.9 mEq/L (ref 3.5–5.1)
Total Bilirubin: 2.7 mg/dL — ABNORMAL HIGH (ref 0.3–1.2)

## 2010-09-12 LAB — TYPE AND SCREEN
ABO/RH(D): O POS
Antibody Screen: NEGATIVE

## 2010-09-12 LAB — PROTIME-INR
INR: 1.2 (ref 0.00–1.49)
INR: 1.7 — ABNORMAL HIGH (ref 0.00–1.49)
INR: 1.9 — ABNORMAL HIGH (ref 0.00–1.49)
INR: 2.1 — ABNORMAL HIGH (ref 0.00–1.49)
Prothrombin Time: 23.2 seconds — ABNORMAL HIGH (ref 11.6–15.2)
Prothrombin Time: 25 seconds — ABNORMAL HIGH (ref 11.6–15.2)
Prothrombin Time: 25.1 seconds — ABNORMAL HIGH (ref 11.6–15.2)

## 2010-09-12 LAB — CREATININE, SERUM
Creatinine, Ser: 1.02 mg/dL (ref 0.4–1.2)
GFR calc Af Amer: >60 mL/min (ref >60)
GFR calc Af Amer: >60 mL/min (ref >60)
GFR calc non Af Amer: 55 mL/min — ABNORMAL LOW (ref >60)
GFR calc non Af Amer: >60 mL/min (ref >60)

## 2010-09-12 LAB — POCT I-STAT 3, VENOUS BLOOD GAS (G3P V)
pCO2, Ven: 43.6 mmHg — ABNORMAL LOW (ref 45.0–50.0)
pH, Ven: 7.386 — ABNORMAL HIGH (ref 7.250–7.300)

## 2010-09-12 LAB — PLATELET COUNT: Platelets: 178 10*3/uL (ref 150–400)

## 2010-09-12 LAB — MAGNESIUM: Magnesium: 2.5 mg/dL (ref 1.5–2.5)

## 2010-09-12 LAB — HEMOGLOBIN A1C: Mean Plasma Glucose: 59 mg/dL

## 2010-09-12 NOTE — Assessment & Plan Note (Signed)
OFFICE VISIT  [name], [name] DOB:  [birth year]                                        September 11, 2010 CHART #:  [id number]  HISTORY OF PRESENT ILLNESS:  The patient returns for followup now over 2 years status post mitral valve repair and Maze procedure.  She was last seen here in the office on August 02, 2009.  Since then, she has improved considerably.  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  However, her chest x-rays have documented the presence of normal aeration on the right side.  From [name] cardiac standpoint, she has done well and she is now maintaining sinus rhythm off of Coumadin.  She has no complaints.  She denies any tachy palpitations.  She denies any symptoms of heart failure.  The remainder of her review of systems is unremarkable.  PHYSICAL EXAMINATION:  General:  Well-appearing female.  Vital Signs: Blood pressure 109/68, pulse 72 and regular, oxygen saturation 91% on room air.  Chest:  Auscultation of the chest reveals clear breath sounds that are symmetrical bilaterally with notably excellent aeration in the right lung base.  Cardiovascular:  Regular rate and rhythm.  No murmurs, rubs, or gallops are noted.  Abdomen:  Soft, nontender.  EXTREMITIES: Warm and well perfused.  IMPRESSION:  The patient seems to be doing well more than 2 years following mitral valve repair and Maze procedure.  She is maintaining sinus rhythm and is now off of Coumadin.  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  Overall, she is clinically doing quite well.  PLAN:  In future the patient will call and return to see [place] as needed. All their questions have been addressed.  Salvatore Decent. Cornelius Moras, M.D. Electronically  Signed  CHO/MEDQ  D:  09/11/2010  T:  09/12/2010  Job:  [id number]  cc:   Armanda Magic, M.D. Candyce Churn, M.D. Quenton Fetter, M.D. Barbaraann Share, MD,FCCP

## 2010-10-10 NOTE — Assessment & Plan Note (Signed)
OFFICE VISIT   [name], [name]  DOB:  1950-07-20                                        August 09, 2008  CHART #:  [id number]   The patient returns for routine followup today, status post right  miniature thoracotomy for mitral valve repair and Cox-CryoMaze procedure  on July 20, 2008.  Her early postoperative recovery was somewhat  slow, but otherwise uncomplicated, and she was discharged home in sinus  rhythm on July 28, 2008.  She did require oxygen therapy for increased  oxygen requirement following surgery related to postoperative  atelectasis and right lung atelectasis and opacity.  She was improving  at the time of hospital discharge.  Since then, she has continued to  improve steadily.  She has had a prothrombin time checked at Dr.  Norris [name] office, and her most recent INR was reportedly 2.6.  Her  breathing has continued to gradually improve, and she is now off of  oxygen and no longer wearing it at night when she sleeps.  She states  that she does not feel back to normal, but she has enjoyed considerable  improvement since she is going home.  Her appetite is not normal.  Her  weight has continued to trend down and actually now is below her  baseline weight.  She did develop some cellulitis involving her right  neck stab incision and was seen by Dr. Donata [name] last week.  She was  started on oral Keflex and the cellulitis has improved rapidly.  She has  never had any fevers or chills.  She has mild residual soreness in her  chest that continues to improve.  She has had no other problems.  The  remainder of her review of systems is unremarkable.   PHYSICAL EXAMINATION:  GENERAL:  A well-appearing obese female.  VITAL SIGNS:  Blood pressure 132/79, pulse 80 and regular, and oxygen  saturation 92% on room air.  Two-channel telemetry rhythm strip  demonstrates normal sinus rhythm.  NECK:  There remains some mild cellulitis at the right neck that  reportedly is much better than it looked last week.  LUNGS:  Auscultation of the chest reveals clear breath sounds  bilaterally with somewhat diminished breath sounds at the right lung  base.  No wheezes or rhonchi are noted.  CARDIOVASCULAR:  Regular rate and rhythm.  No murmurs, rubs, or gallops  are appreciated.  ABDOMEN:  Soft and nontender.  EXTREMITIES:  Warm and well perfused.  There is no lower extremity  edema.  The right groin incision is healing nicely.  No other  abnormalities are noted.   DIAGNOSTIC TEST:  Chest x-ray obtained today is reviewed and compared  with her last chest x-ray prior to hospital discharge.  This  demonstrates stable mild elevation of the right hemidiaphragm with  associated right lower lobe atelectasis.  There is a very small amount  of pleural fluid on the right side that is stable and unchanged.  No  other significant abnormalities are noted.   IMPRESSION:  The patient continues to do quite well.  She still has some  elevation of the right hemidiaphragm with presumed mild paresis of the  right phrenic nerve related to her recent surgery.  This seems to be  improving and symptomatically she is getting along quite well.  She is  maintaining sinus rhythm.  She has developed cellulitis in the right  neck stab incision, but this is resolving with oral antibiotics.  Her  weight is now below baseline, and I suspect that we should back off on  her diuretic.   PLAN:  I have instructed the patient to cut back her dose of Lasix to 40  mg twice daily and to cut back her corresponding dose of potassium to 40  mEq daily.  I have encouraged her to continue to increase her physical  activity as tolerated.  At this point, it is unlikely that she could  damage anything physically, so that I have suggested that she can  increase her physical activity gradually and start pushing herself more  physically without particular concern.  We will plan to see her back in   4 weeks with a repeat chest x-ray.  I would keep her on low-dose  amiodarone and Coumadin for the time being.  All of her questions have  been addressed.   Amanda [name], M.D.  Electronically Signed   CHO/MEDQ  D:  08/09/2008  T:  08/10/2008  Job:  [id number]   cc:   Amanda [name], M.D.  Amanda [name], M.D.  Dr. Mancel [name]

## 2010-10-10 NOTE — Discharge Summary (Signed)
NAMEAPPHIA, CROPLEY             ACCOUNT NO.:  [id number]   MEDICAL RECORD NO.:  [id number]          PATIENT TYPE:  INP   LOCATION:  2002                         FACILITY:  [facility]   PHYSICIAN:  Armanda Magic, M.D.     DATE OF BIRTH:  16-Feb-1951   DATE OF ADMISSION:  03/31/2008  DATE OF DISCHARGE:  04/09/2008                               DISCHARGE SUMMARY   DISCHARGE DIAGNOSES:  1. Atrial fibrillation, amiodarone loading, PFTs, minimal obstructive      airway disease.  2. Severe mitral regurgitation, status post surgical consult.  3. Hypokalemia, repleted.  4. Leukocytosis secondary to steroids, resolving.  5. History of atrial fibrillation, status post an attempted      percutaneous ablation by Dr. Sampson Goon at [facility].  6. Pericardial tamponade, status post subxiphoid pericardial window on      March 03, 2008.  7. Acute congestive heart failure secondary to not only atrial      fibrillation, but severe mitral regurgitation.  8. Long-term anticoagulation therapy.  9. Hemochromatosis.  10.Chronic hemolytic anemia.  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  12.Gastroesophageal reflux disease.  13.History of splenectomy.  14.History of cholecystectomy.  15.Asthma.  16.Depression.  17.Long-term medication use.   HOSPITAL COURSE:  Ms. Embree is a 60 year old female with many medical  problems as listed above.  She had an attempted ablation at [facility] under the care of Dr. Sampson Goon back  in September.  Postoperatively, she developed class IV congestive heart  failure for which she was ultimately admitted on March 01, 2008.  She  was found to have a pericardial tamponade and underwent subxiphoid  pericardial window on March 03, 2008.  She recovered uneventfully from  this.  Since then, she has had atrial fibrillation and has gone back for  cardioversions on two occasions; initially in [place] and then  a  third time here mostly by Dr. Mayford Knife.   Through the incidents with Dr. Mayford Knife, a TEE was performed.  She was  noted to have moderate-to-severe mitral regurgitation.  Following the  procedure, she developed worsening shortness of breath despite  maintaining normal sinus rhythm.  She was then admitted to the hospital  and treated for symptomatic congestive heart failure.  Right and left  heart catheterization was performed on April 05, 2008 this  demonstrated normal coronary anatomy with no significant coronary artery  disease, who has moderate-to-severe mitral regurgitation.  Pulmonary  artery pressures were elevated.  Dr. Tressie Stalker was consulted with  the heart surgeon service to evaluate for mitral valve  replacement/repair.  He felt that she does need surgery, but recommended  continued medical therapy for treatment of congestive heart failure and  atrial fibrillation, amiodarone.  He favored waiting at least 2 to 3  months before proceeding with elective surgery if possible due to her  recent cardiac problems.   During this hospitalization, she did revert into atrial fibrillation and  we did start her on amiodarone load.  Upon discharge, her INR is 2.7.  We to have her under rate control and  feel like it is safe for her to go  home.   Lab studies during the patient's hospitalization include sodium 137,  potassium 3.4, repleted.  BUN 32, creatinine 1.27, PT 30.5, INR 2.7.  Hemoglobin 9.8, hematocrit 29.6, white count 14.8 (decreased), platelets  557.  BNP 136.   DISCHARGE MEDICATIONS:  1. Toprol-XL 25 mg a day.  2. Advair 1 inhalation twice a day.  3. Allegra 180 mg a day.  4. Folic acid 1 mg a day.  5. Flonase nasal spray as prior to admission.  6. Amiodarone take as directed.   Per instructions are as follows:  1. Amiodarone 200 mg 2 tablets p.o. b.i.d. x1 week, then 2 tablets      p.o. daily for 1 week, then one p.o. daily thereafter.  2. Lasix 40 mg a day.  3.  Potassium 20 mEq a day.  4. Coumadin 2.5 mg a day.  5. Exjade 1500 mg a day.  6. Ambien at bedtime p.r.n.  7. Tramadol p.r.n. pain.  8. Caltrate D daily.  9. Protonix 40 mg a day.  10.Vitamin D daily.   She is instructed to stop her flecainide and Cardizem.   She is to remain on a low-sodium, heart-healthy diet.  Increase activity  slowly.  Follow up with Dr. Mayford Knife with an EKG on April 15, 2008 at  11:45 a.m.      Guy Franco, P.A.      Armanda Magic, M.D.  Electronically Signed    LB/MEDQ  D:  04/09/2008  T:  04/10/2008  Job:  [id number]   cc:   Salvatore Decent. Cornelius Moras, M.D.  Candyce Churn, M.D.  Mick Sell, MD

## 2010-10-10 NOTE — H&P (Signed)
NAME:  [name], [name]             ACCOUNT NO.:  [id number]   MEDICAL RECORD NO.:  [id number]          PATIENT TYPE:  INP   LOCATION:  3729                         FACILITY:  [facility]   PHYSICIAN:  Armanda Magic, M.D.     DATE OF BIRTH:  28-Dec-1950   DATE OF ADMISSION:  03/01/2008  DATE OF DISCHARGE:                              HISTORY & PHYSICAL   [name] is a very pleasant 60 year old female patient of Dr. Gloris Manchester  [facility]er[facility] with a history of recent attempted ablation surgery over atrial  flutter approximately 2 weeks ago.  She is concerned as in the last 4  days as she has had a fever.  She began having discomfort in the left  upper portion of back, radiating into the left arm  and into the jaw at  that time.  She had no anterior chest discomfort.  She has had no  problems with breathing or palpitations.  Her symptoms are worse when  she lies flat.  Her upper back discomfort, however, seems to get better  with position changes.  She presented to [facility] ER yesterday where  chest x-ray revealed an enlargement of the cardiopericardial silhouette.  There was a question of pericardial effusion.  She was evaluated in the  ER by the physician on call.  He had wanted to do a cardiac  echocardiogram at that time but advised her  that it could not be read  until in the morning.  She was, therefore, discharged home and advised  to follow up with her primary cardiologist in the morning.  She states  she continues to have the discomfort.  She has had no lightheadedness or  syncope.  A 2-D cardiac echo  revealed a mild to moderate pericardial  effusion, approximately 2 cm.  She is being admitted for the same.   CURRENT MEDICATIONS:  1. Ultram 50 mg t.i.d. p.r.n.  2. Folic acid 1 mg 1 tablet once a day.  3. Allegra 180 mg one tablet a day.  4. Nasonex nasal spray 2 puffs in each nostril once a day.  5. Advair discus 250/50 1 puff b.i.d.  6. Coumadin adjusted doses daily.  7. Exjade 500 mg 3  tablets once daily.  8. Vitamin D 1200 1 tablet once a day.  9. Lasix 40 mg once a day.  10.Potassium 20 mEq 1 tablet twice a day.  11.Metoclopramide 10 mg 1 tablet 30 minutes before meals and bedtime      four times a day.  12.Flecainide 150 mg question dose, 1 tablet q.12 h.  13.Protonix 40 mg daily.   DRUG ALLERGIES:  SHELLFISH.   PAST MEDICAL HISTORY:  1. Paroxysmal atrial fib/flutter.  2. Chronic Coumadin use.  3. Hypertension.  4. Pyruvate kinase deficiency with hemochromatosis, normal hemoglobin      8 to 9.  5. Hemolytic anemia.  6. GERD.  7. Mild to moderate MR.  8. No evidence of ischemia/infarct on February 29, 2008 stress      Cardiolite.  Ejection fraction 73% on that study.   REVIEW OF SYSTEMS:  Review of systems as above.  More fatigue in the  last week.  HEENT: No problems with vision, hearing.  She has had some  nasal drainage but nothing purulent.  Mild sinus pressure.  No sore  throat or drainage.  NECK:  Some discomfort as above.  CHEST:  Denies  shortness of breath, PND, orthopnea.  No cough or wheezing.  CARDIOVASCULAR:  Positive palpitations.  No pleuritic chest discomfort  or any chest discomfort.  ABDOMEN:  Hematochezia, melena, some nausea.  No vomiting.  No abdominal pain.  No melena, hematochezia.  EXTREMITIES:  No edema.   SURGICAL HISTORY:  1. Status post attempted ablation surgery, September 2009, [facility].  2. Splenectomy, 1953.  3. Cholecystectomy, 1998.  4. Laparoscopic surgery for removal of uterine lesions, Dr. Thomasena Edis.   OBJECTIVE:  VITAL SIGNS:  Weight 172, temperature 99.5, pulse 80, blood  pressure 120/80.  GENERAL:  Very pleasant lady in no acute distress.  SKIN:  Slightly jaundiced, unchanged for her.  HEENT:  Unremarkable.  NECK:  No JVD.  No carotid bruits are auscultated.  The thyroid gland is  not enlarged.  No masses.  No adenopathy.  CHEST:  Good excursion clear throughout without rales, wheezing or  rhonchi.   CARDIOVASCULAR:  Rapid S1 and S2, no murmur or rub.  ABDOMEN:  Nondistended.  Bowel sounds present.  No abdominal bruits.  No  tenderness is noted.  EXTREMITIES:  No clubbing, cyanosis or edema.  +2  distal pulses.   LABORATORY DATA:  February 29, 2008, hemoglobin 8.4, hematocrit 23.5.  CBC  15,200, MCV 107.1, MCHC 33, platelet count 680,000, neutrophils 62%,  lymphocytes 21%, monocytes 15%.  BMET reveals sodium 138, potassium 3.2,  chloride 103, carbon dioxide 28, glucose 110, BUN 16, creatinine 1.03,  calcium 8.8.   EKG at [facility] on February 29, 2008 reveals atrial  flutter with rapid ventricular rate at 110 beats per minute.  A 2-D  cardiac echo reveals 2 cm pericardial effusion.  There is no evidence of  tamponade.   IMPRESSION:  1. Pericardial effusion.  2. Status post attempted ablative therapy, September 2009, [facility].  3. Pyruvate kinase deficiency with hemachromatosis.  4. Chronic Coumadin use.  5. Hypertension, good control.  6. Hemolytic anemia.  7. Gastroesophageal reflux disease.  8. Mild to moderate mitral regurgitation.  9. No evidence of ischemia/infarct October 2009 on the stress      Cardiolite at that time October 2009.   PLAN:  Admit.  Monitored bed.  Continue antiarrhythmic therapy.  Reverse  anticoagulation.  Vitamin K 5 mg p.o. given in the office.  Placed on  Indocin three times a day with meals.  Increase PPI for GI protection.  Dr. Mayford Knife to follow.      Tamera C. Baruch Merl, M.D.  Electronically Signed    TCL/MEDQ  D:  03/01/2008  T:  03/01/2008  Job:  [id number]

## 2010-10-10 NOTE — H&P (Signed)
HISTORY AND PHYSICAL EXAMINATION   July 19, 2008   Re:  Amanda [name], Amanda [name]       DOB:  1950/11/18   Date of planned hospital admission July 20, 2008.   PRESENTING CHIEF COMPLAINT:  Exertional shortness of breath.   HISTORY OF PRESENT ILLNESS:  The patient is [name] female with  history of pyruvate kinase deficiency and chronic anemia who more  recently has been followed by Dr. Armanda [name] for mitral regurgitation  and atrial fibrillation.  The patient first developed paroxysmal atrial  fibrillation approximately 4 years ago.  Initially, she was controlled  with medical therapy and maintained in sinus rhythm.  More than [name] year  ago, she developed persistent atrial fibrillation refractory to medical  therapy.  In June 2008, she was hospitalized with congestive heart  failure.  She was also noted to have mitral regurgitation.  In September  2009, she underwent attempted percutaneous ablation for treatment of  atrial fibrillation at [facility].  The procedure was never completed and the patient went into  class IV congestive heart failure postoperatively.  In October, she was  hospitalized with pericardial tamponade and underwent subxiphoid  pericardial window.  She recovered from this uneventfully.  She  underwent attempted DC cardioversion on 2 occasions, both of which were  unsuccessful.  In November, she had [name] transesophageal echocardiogram  performed.  This demonstrated moderate-to-severe mitral regurgitation.  Left and right heart catheterization was performed demonstrating normal  coronary artery anatomy with no significant coronary artery disease.  Pulmonary artery pressures were mildly elevated.  I had the opportunity  to see the patient in consultation at that time.  Because of her recent  series of complications following percutaneous ablation for atrial  fibrillation and the subsequent pericardial  tamponade, any plans for  surgical intervention were delayed until further healing had occurred.  Since then, the patient has done well on medical therapy, and recently  she has been maintaining sinus rhythm on amiodarone.  She now presents  for elective surgical intervention for treatment of underlying mitral  regurgitation and atrial fibrillation.   REVIEW OF SYSTEMS:  General:  The patient feels well.  Her appetite is  stable.  Cardiac:  The patient describes stable exertional shortness of  breath.  The patient denies resting shortness of breath, PND, orthopnea,  or lower extremity edema.  She has not had chest pain.  Respiratory:  Notable only for exertional shortness of breath.  She denies productive  cough, hemoptysis, wheezing.  Gastrointestinal:  Negative.  The patient  has no difficulty swallowing.  Bowel function is regular.  Musculoskeletal:  Negative.  Genitourinary:  Negative.  HEENT:  Negative.  Hematologic:  Notable for chronic stable anemia related to  underlying pyruvate kinase deficiency.   PAST SURGICAL HISTORY:  1. Splenectomy.  2. Cholecystectomy with appendectomy.  3. Subxiphoid pericardial window.   PAST MEDICAL HISTORY:  1. Mitral regurgitation.  2. Atrial fibrillation.  3. Hypertension.  4. Congestive heart failure.  5. Pyruvate kinase deficiency.  6. Chronic anemia.  7. Status post splenectomy.  8. Asthma.  9. Depression.   FAMILY HISTORY:  Notable for pyruvate kinase deficiency.   SOCIAL HISTORY:  The patient is married and lives in [place] with her  husband.  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  She is [name]  nonsmoker.  She denies significant alcohol consumption.   CURRENT MEDICATIONS:  1. Folic acid 1 mg daily.  2.  Lasix 40 mg daily.  3. Potassium chloride 40 mEq twice daily.  4. Ultram 50 mg 3 times daily as needed for pain.  5. Exjade 500 mg 3 tablets daily.  6. Allegra 180 mg daily.  7. Zolpidem 10 mg daily.  8. Advair Diskus  250/50 one puff twice daily.  9. Nasonex as needed.  10.Vitamin D daily.  11.Caltrate 1 tablet daily.  12.Amiodarone 200 mg daily.  13.Toprol-XL 25 mg daily.   DRUG ALLERGIES:  IV contrast.   PHYSICAL EXAMINATION:  GENERAL:  The patient is [name] well-appearing,  moderately obese female who appears of stated age, in no acute distress.  VITAL SIGNS:  Blood pressure 134/83, pulse 88 and regular, and oxygen  saturation 92% on room air.  HEENT:  Unrevealing.  NECK:  Supple.  There is no palpable lymphadenopathy.  There is no  jugular venous distention.  No carotid bruits are noted.  CHEST:  Auscultation of the chest reveals clear breath sounds, which are  symmetrical.  No wheezes or rhonchi are noted.  CARDIOVASCULAR:  Notable  for regular rate and rhythm.  There is [name] grade 2-3/6 systolic murmur  heard best at the apex with radiation to the axilla.  No diastolic  murmurs are noted.  ABDOMEN:  Soft, nontender.  There are no palpable masses.  Bowel sounds  are present.  EXTREMITIES:  Warm and adequately perfused.  There is no lower extremity  edema.  Distal pulses are not palpable at the ankle.  Femoral pulses are  easily palpable bilaterally.  SKIN:  Clean, dry, and healthy appearing throughout.  RECTAL/GU:  Both deferred.  NEUROLOGIC:  Grossly nonfocal and symmetric throughout.   IMPRESSION:  Mitral regurgitation with longstanding persistent atrial  fibrillation and stable symptoms of moderate exertional shortness of  breath.   PLAN:  We planned to proceed with right mini-thoracotomy for mitral  valve repair and [name] procedure tomorrow.  The patient and her family  understand and accept all potential associated risks of surgery  including but not limited to risk of death, stroke, myocardial  infarction, congestive heart failure, respiratory failure, pneumonia,  bleeding requiring blood transfusion, arrhythmia, heart block with  bradycardia requiring permanent pacemaker.  She also  understands the  small possibility that valve repair will not be feasible in which case  valve replacement would be necessary.  Based upon her age, we would  probably plan to replace her valve using [name] mechanical prosthesis under  those circumstances due to concerns regarding longevity of mitral  prosthesis in this patient with otherwise reasonably good life  expectancy.  However, the  likelihood that valve would need to be replaced is quite small.  I feel  confident that valve repair will be feasible.  All of their questions  have been addressed.   Salvatore Decent. Cornelius Moras, M.D.  Electronically Signed   CHO/MEDQ  D:  07/19/2008  T:  07/19/2008  Job:  [id number]   cc:   Amanda [name], M.D.  Leighton Roach. Truett Perna, M.D.  Candyce Churn, M.D.

## 2010-10-10 NOTE — Consult Note (Signed)
NAME:  [name], [name]             ACCOUNT NO.:  [id number]   MEDICAL RECORD NO.:  [id number]          PATIENT TYPE:  INP   LOCATION:  2002                         FACILITY:  [facility]   PHYSICIAN:  Salvatore Decent. Cornelius Moras, M.D. DATE OF BIRTH:  07/19/1950   DATE OF CONSULTATION:  04/06/2008  DATE OF DISCHARGE:                                 CONSULTATION   REASON FOR CONSULTATION:  Mitral regurgitation and atrial fibrillation.   HISTORY OF PRESENT ILLNESS:  [name] is a 60 year old female with  pyruvate kinase deficiency and chronic anemia, who has been followed by  Dr. Armanda Magic for several years with known history of mitral  regurgitation and atrial fibrillation.  The patient first developed  paroxysmal atrial fibrillation approximately 4 years ago.  Initially,  she was controlled with medical therapy and maintained in sinus rhythm.  She has been taking Coumadin for 4 years without any complications  related to anticoagulation.  Approximately 1 year ago, she developed  persistent atrial fibrillation that has been refractory to medical  therapy.  She was hospitalized in June 2008 with symptoms of congestive  heart failure.  In May 2009, she was hospitalized with pneumonia and  shortness of breath.  In September 2009, she underwent an attempted  percutaneous ablation for treatment of atrial fibrillation by Dr.  Sampson Goon at [facility].  According to the patient, the procedure was quite long, and Dr.  Sampson Goon had to stop without completing a full Maze lesion set.  Postoperatively, she developed class IV congestive heart failure for  which she was ultimately admitted on March 01, 2008.  She was found to  have pericardial tamponade and underwent subxiphoid pericardial window  on March 03, 2008.  She was found to have a bloody pericardial effusion  with severe acute inflammation throughout the pericardial space.  She  recovered from this  uneventfully.  Since then, she returned and  underwent DC cardioversion on 2 occasions, initially in [place],  and at third time, locally here by Dr. Mayford Knife.  This last time was  performed on March 30, 2008, and transesophageal echocardiogram was  performed at the time.  She was noted to have moderate-to-severe mitral  regurgitation.  Following this procedure, she developed worsening  shortness of breath despite maintaining sinus rhythm initially.  She was  admitted to the hospital and treated for symptomatic acute congestive  heart failure.  Left and right heart catheterization was performed on  April 05, 2008, demonstrating normal coronary artery anatomy with no  significant coronary artery disease.  There was moderate-to-severe  mitral regurgitation.  Pulmonary artery pressures were very mildly  elevated.  Cardiothoracic surgical consultation has not been requested  to consider minimally invasive mitral valve repair with or without  concomitant Maze procedure.   REVIEW OF SYSTEMS:  GENERAL:  The patient reports normal appetite.  She  has had longstanding problems with exertional fatigue related to her  underlying chronic anemia, but she has learnt to live with this all of  her life and reportedly knows how to pace herself for most daily  activities.  She states that it has only been over the last year that  she has had further problems with exertional fatigue and shortness of  breath.  Her appetite is stable and she has not been gaining nor losing  weight.  CARDIAC:  The patient has longstanding history of moderate  exertional shortness of breath that has worsened over the past year.  Her symptoms have been much worse since she underwent atrial  fibrillation ablation in September, and she was hospitalized with class  IV congestive heart failure in October.  Over the last few weeks, she  has not had resting shortness of breath, PND, orthopnea, nor lower  extremity edema.  She  does have exertional shortness of breath.  She has  never had any chest pain.  She denies any dizzy spells or syncopal  events.  RESPIRATORY:  Notable for exertional shortness of breath.  The  patient denies productive cough, hemoptysis, or wheezing.  GASTROINTESTINAL:  Negative.  The patient has no difficulty swallowing.  She denies hematochezia, hematemesis, or melena.  MUSCULOSKELETAL:  Negative.  The patient denies significant problems with arthritis or  arthralgias.  She does report that her legs give out if she tries to  push too hard such as going up several flights of stairs.  GENITOURINARY:  Negative.  HEENT:  Negative.  She sees her dentist on a  regular basis and has been treated with dental prophylaxis in the past.  INFECTIOUS:  Negative.  PSYCHIATRIC:  Negative.   PAST MEDICAL HISTORY:  1. Mitral regurgitation.  2. Persistent atrial fibrillation, status post attempted percutaneous      left atrial ablation in September 2009.  3. Hypertension.  4. Congestive heart failure, acute systolic and diastolic.  5. Pyruvate kinase deficiency.  6. Chronic anemia.  7. Status post splenectomy.  8. Asthma.  9. Depression.   PAST SURGICAL HISTORY:  1. Splenectomy, age 89.  2. Cholecystectomy in 1989 with appendectomy.  3. Subxyphoid pericardial window, March 03, 2008.   FAMILY HISTORY:  Notable for pyruvate kinase deficiency.   SOCIAL HISTORY:  The patient is married and lives with her husband in  [place].  She works in Airline pilot for [employer].  She is a  nonsmoker and she denies significant alcohol consumption.   MEDICATIONS PRIOR TO ADMISSION:  1. Toprol-XL 25 mg daily.  2. Advair Diskus 250/50 one puff twice daily.  3. Caltrate vitamin D 1 tablet daily.  4. Coumadin 2.5 mg daily.  5. Diltiazem XL 300 mg daily.  6. Exjade 500 mg daily.  7. Allegra 180 mg daily.  8. Flecainide 100 mg twice daily.  9. Folic acid 1 mg daily.  10.Lasix 40 mg daily.  11.Nasonex spray  as needed.  12.Protonix 40 mg daily.  13.Potassium chloride 20 mEq daily.  14.Ultram 1 tablet 3 times daily as needed.  15.Vitamin D 1000 units daily  16.Zolpidem 5 mg daily.   DRUG ALLERGIES:  None known.  The patient does report history of allergy  to IODINE-CONTAINING PRODUCTS, IV CONTRAST, and SHELLFISH.   PHYSICAL EXAMINATION:  GENERAL:  The patient is well-appearing mild-to-  moderately obese white female, who appears her stated age, in no acute  distress.  VITAL SIGNS:  She is currently in atrial fibrillation with heart rate  approximately 90.  She is afebrile.  HEENT:  Unrevealing.  NECK:  Supple.  There is no palpable lymphadenopathy.  There is no  jugular venous distention.  No carotid bruits noted.  CHEST:  Auscultation of  the chest demonstrates clear breath sounds with  a few inspiratory crackles at both lung bases.  No wheezes nor rhonchi  are noted.  CARDIOVASCULAR:  Notable for irregular heart rhythm.  There is a grade  3/6 systolic murmur heard best at the apex with radiation to the axilla.  No diastolic murmurs are noted.  ABDOMEN:  Moderately obese, but soft and nontender.  There are no  palpable masses.  Bowel sounds are present.  EXTREMITIES:  Warm and well perfused.  There is no lower extremity  edema.  Pulses are palpable in both groins, although the patient does  have catheterization on the right side, so palpation is deferred.  Distal pulses are palpable in the dorsalis pedis position.  There is no  sign of significant venous insufficiency.  RECTAL/GU:  Both deferred.  NEUROLOGIC:  Grossly nonfocal and symmetrical throughout.   DIAGNOSTIC TEST:  Transesophageal echocardiogram performed on March 30, 2008, by Dr. Mayford Knife is reviewed.  This demonstrates mitral valve  prolapse with at least moderate to severe mitral regurgitation.  The jet  of regurgitation is at times difficult to visualize, but on most views  is central and fills the majority of the left  atrium.  The regurgitant  volume and effective regurgitant orifice area were not calculated, but  just eyeballing the exam, I believe this is likely severe mitral  regurgitation.  There was not flow reversal on the pulmonary veins  appreciated, but the jet of regurgitation extends back into the  pulmonary veins.  There appears to be a small knuckle of prolapse of a  portion of the posterior leaflet of the mitral valve.  Both the anterior  and posterior leaflets are mildly thickened.  There is no anterior  leaflet prolapse.  There may be some restriction of some of the  posterior leaflet.  There is mild left ventricular dysfunction.  The  aortic valve appears normal with trivial aortic regurgitation.  There is  moderate (2+) tricuspid regurgitation.  There appears to be a small  communication between the left and right atrium with left-to-right flow  on color Doppler imaging.  Bubble study is performed and there does not  appear to be any right to left shunt.   Cardiac catheterization performed on April 05, 2008, by Dr. Mayford Knife is  reviewed.  This demonstrates normal coronary artery anatomy with no  significant coronary artery disease.  There is at least moderate mitral  regurgitation on left ventricular ejection.  Pulmonary artery pressures  were estimated 29 systolic with pulmonary capillary wedge pressure of  18.  Central venous pressure was 11.  Cardiac output using  thermodilution was 3.96 L corresponding to a cardiac index of 2.3.  The  left atrium was severely dilated.   IMPRESSION:  Probably severe mitral regurgitation with persistent atrial  fibrillation and symptomatic congestive heart failure.  I believe that  Ms. Nied would best be treated with elective mitral valve repair and  concomitant Maze procedure.  She recently underwent an attempted left  atrial ablation for treatment of atrial fibrillation, and this was  complicated by perforation with pericardial tamponade.   Under the  circumstances, I do not feel that it would be wise to proceed with  surgery until she has had time to get over this completely and for her  left atrium to heal.   PLAN:  I have discussed the matters at length with Ms. Calamari and her  sister.  Alternative treatment strategies have been discussed.  We  have  also discussed whether or not it would be wise to proceed with surgery  at some point using minimally invasive approach via right miniature  thoracotomy versus use of a conventional sternotomy.  All of  their questions have been addressed.  I would recommend continued  medical therapy for treatment of congestive heart failure and atrial  fibrillation, possibly to include amiodarone.  I would favor waiting at  least 2-3 months before proceeding with elective surgery if possible.  All of their questions have been addressed.      Salvatore Decent. Cornelius Moras, M.D.  Electronically Signed     CHO/MEDQ  D:  04/06/2008  T:  04/07/2008  Job:  [id number]   cc:   Candyce Churn, M.D.

## 2010-10-10 NOTE — Op Note (Signed)
[name], [name]             ACCOUNT NO.:  [id number]   MEDICAL RECORD NO.:  [id number]          PATIENT TYPE:  INP   LOCATION:  2912                         FACILITY:  [facility]   PHYSICIAN:  Salvatore Decent. Dorris Fetch, M.D.DATE OF BIRTH:  September 08, 1950   DATE OF PROCEDURE:  03/03/2008  DATE OF DISCHARGE:                               OPERATIVE REPORT   PREOPERATIVE DIAGNOSIS:  Symptomatic pericardial effusion.   POSTOPERATIVE DIAGNOSIS:  Symptomatic pericardial effusion.   PROCEDURE:  Subxiphoid pericardial window.   SURGEON:  Salvatore Decent. Dorris Fetch, MD   ASSISTANT:  Rowe Clack, PA-C   ANESTHESIA:  General.   FINDINGS:  400 mL of bloody pericardial fluid, shaggy fibrinous exudate  on both pericardium and epicardial surface, transesophageal  echocardiography revealed two small fenestrations in the atrial septum,  as well as mild-to-moderate mitral insufficiency, large pericardial  effusion pre-drainage, resolved pericardial effusion post drainage.   CLINICAL NOTE:  [name] is a 60 year old woman with a history of  hemolytic anemia and atrial fibrillation.  She recently had an atrial  ablation procedure at [facility].  She presented to [facility]  with a 4-day history of back, shoulder and arm pain and as well as chest  discomfort.  She had a moderate pericardial effusion with no overt  tamponade.  She was admitted and treated for acute pericarditis.  She on  the morning of March 03, 2008 had an increasing discomfort accompanied  now for the first time with shortness of breath.  She was advised to  undergo subxiphoid pericardial window for drainage of the fluid for  diagnostic and therapeutic purposes.  Indications, risks, benefits and  alternatives were discussed in detail with the patient.  She understood  and accepted the risks and agreed to proceed.   OPERATIVE NOTE:  [name] was brought to the preop holding area on  March 03, 2008.  There Anesthesia placed a  central venous monitoring  catheter and arterial blood pressure monitoring catheter was placed.  PAS hose were placed for DVT prophylaxis.  Intravenous antibiotics were  administered.  She was taken to the operating room, anesthetized and  intubated.  Transesophageal echocardiography was performed that showed a  moderately large pericardial effusion.  Other findings as noted in Dr.  Robina Ade dictation.  A Foley catheter was placed.  The chest and abdomen  were prepped and draped in usual sterile fashion.  An incision was made  in the midline over the lower chest over the xiphoid process.  This was  carried through the skin and subcutaneous tissue.  The xiphoid was  identified and small portion excised.  Upward traction on the sternum  and downward traction on the preperitoneal fat exposed the pericardium.  The pericardium was incised and 400 mL of dark bloody fluid were  evacuated.  This was sent for chemistries, cell counts, cultures and  cytology.  Approximately, 2 cm square piece of the pericardium was  excised.  The small portion was sent for culture and the remainder was  sent for pathology.  The inner surface of the pericardium had a fuzzy  fibrinous exudate.  This was also present over the epicardial surface of  the heart.  Transesophageal echocardiography confirmed complete  evacuation of the effusion.  A 28-French Blake drain was placed via  separate incision and directed into the pericardium along the  diaphragmatic surface and secured with a #1 silk suture.  The incision  was closed in three layers, the first layer with a running #1 Vicryl  fascial suture, followed by a 2-0 Vicryl subcutaneous suture and 3-0  Vicryl subcuticular suture.  Chest tube was placed to suction and the  patient was moved onto the stretcher but before being extubated,  transported to the postanesthetic care unit.  She had runs of wide  complex tachycardia, and she was treated with lidocaine bolus.  She  was  tuned to have intermittent atrial fibrillation and atrial flutter and  was unclear if this wide complex was aberrancy dissociation with those  rhythms or whether there was actual ventricular tachycardia.  The  patient did maintain a pulse with each episode while I was present in  the operating room but apparently had lost very briefly less than 15  seconds with pressure wave at one point.  The patient will be  transported to the postanesthetic care unit intubated and in lidocaine  infusion.  A 12-lead EKG will be performed and we will ask Cardiology to  evaluate along with [place].      Salvatore Decent Dorris Fetch, M.D.  Electronically Signed     SCH/MEDQ  D:  03/03/2008  T:  03/04/2008  Job:  [id number]   cc:   Lyn Records, M.D.

## 2010-10-10 NOTE — Discharge Summary (Signed)
NAME:  [name], [name]             ACCOUNT NO.:  [id number]   MEDICAL RECORD NO.:  [id number]          PATIENT TYPE:  INP   LOCATION:  1440                         FACILITY:  [facility]   PHYSICIAN:  Candyce Churn, M.D.DATE OF BIRTH:  February 24, 1951   DATE OF ADMISSION:  10/07/2007  DATE OF DISCHARGE:  10/10/2007                               DISCHARGE SUMMARY   DISCHARGE DIAGNOSES:  1. Right upper lobe pneumonia, improved.  2. Severe anemia, likely secondary to bone marrow suppression      secondary to acute illness and chronic history of pyruvate kinase      deficiency - improved with hemoglobin at discharge and 9.0.  3. 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 Mancel Bale.  4. History of asthma.  5. History of atrial fibrillation, atrial flutter followed by Dr.      Armanda Magic.  6. Borderline elevation of blood pressures.  7. 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  8. Moderate mitral regurgitation with mild murmur.  9. Surgical asplenia.  10.History of congestive heart failure 1997 secondary to high output      failure secondary to anemia.  11.Menopause.  12.History of chronic bone pain secondary to pyruvate kinase      deficiency.   DISCHARGE MEDICATIONS:  1. Ceftin 500 mg p.o. b.i.d. for 7 days.  2. Z-Pak.  3. Ultram 50 mg p.o. t.i.d.  4. Folic acid 1 mg daily.  5. Allegra 180 mg p.o. nightly.  6. Advair 250/50 one inhalation b.i.d. for several weeks, then p.r.n.  7. Coumadin 2.5 mg daily alternating with 3.75 mg daily.  8. Calcium 600 mg with vitamin D b.i.d.  9. Sotalol 160 mg p.o. b.i.d.  10.Exjade 500 grams t.i.d.  11.Nasonex 1 spray in each nostril daily p.r.n.  12.Diflucan 150 mg p.o. times one p.r.n. symptoms of yeast vaginitis      or findings of oral thrush.   HOSPITAL COURSE:  This is a very pleasant 60 year old female with the  above medical history who presented to the  emergency room on Oct 07, 2007 with complaint of sore throat and a fever for 104.3.  She had a  nonproductive cough and chills and sweating and headache and generalized  weakness and fatigue.  She denied any shortness of breath or chest pain  or palpitations or abdominal pain but found to have a right upper lobe  infiltrate and white count which is usually in the 15,000 to 16,000  range was 36,200.  She was given Rocephin and azithromycin and  discharged home on Zithromax but was called back by Dr. Mancel Bale to  come to the hospital because of her asplenia and her intense weakness  and apparent failure to thrive at home on the afternoon after her  emergency room visit.   She was brought back into the hospital and admitted and started on IV  Rocephin and azithromycin and became afebrile after 48 hours.  Temperatures were still spiking into the 102 range while hospitalized  the first 48 hours.  She also experienced severe anemia with hemoglobin drifting down to 7.0  with a reticulocyte greater than 20%.  She was transfused 2 units on Oct 09, 2007 and hemoglobin on discharge was 9.0.   LABORATORY DATA:  Laboratories on discharge were as follows:  B-MET  revealed a sodium 136, potassium 3.6, chloride 104, bicarb 27, glucose  100, BUN 12, creatinine 0.95.  Protime on discharge was 2.7.  CBC on Oct 10, 2007 revealed a white count recovering down to 16,900, hemoglobin  9.2 and platelet count 397,000.  Reticulocyte count as mentioned on Oct 09, 2007 was greater than 20%.  Urine culture from Oct 07, 2007 revealed  no growth.  Two blood cultures obtained on Oct 07, 2007 were no growth  by the time of discharge.  Legionella urinary antigen was negative on  Oct 07, 2007 and strep pneumonia urinary antigen was also negative on  Oct 07, 2007.   Other labs during hospitalization revealed magnesium level normal at 2.3  on Oct 07, 2007 and influenza A and B viral antigen from nasal  washings  were negative for A and B for Oct 07, 2007.   The patient was discharged home in improved condition, will return to my  office in 1 week for follow-up chest x-ray.  She is to notify us if she  develops any shortness of breath, chest pain or recurrent fevers.  Any  nausea or vomiting should be reported immediately.   CONDITION ON DISCHARGE:  Much improved.      Candyce Churn, M.D.  Electronically Signed     RNG/MEDQ  D:  10/10/2007  T:  10/10/2007  Job:  [id number]   cc:   Leighton Roach. Truett Perna, M.D.  Fax: [phone]

## 2010-10-10 NOTE — Assessment & Plan Note (Signed)
OFFICE VISIT   [name], [name]  DOB:  1951-03-28                                        June 07, 2008  CHART #:  [id number]   HISTORY OF PRESENT ILLNESS:  The patient returns for followup of mitral  regurgitation and atrial fibrillation.  She was seen in consultation on  April 06, 2008, at which time, she was hospitalized with recurrent  persistent atrial fibrillation and class IV congestive heart failure.  [name]  6 weeks prior to that she underwent attempts of percutaneous ablation  for her atrial fibrillation at [facility], and this procedure was complicated by delayed  perforation of her atrium requiring subxiphoid pericardial window for  pericardial tamponade on March 03, 2008.  At the time of her most  recent hospitalization in November, she was started on amiodarone, and  she converted to normal sinus rhythm.  Since then, she has been  maintaining sinus rhythm and doing fairly well.  She still remains  diuretic-dependent.  She still has exertional shortness of breath,  although her breathing is much better than it was 8 weeks ago.  She has  also recently been diagnosed with obstructive sleep apnea.  The  remainder of her review of systems is unremarkable and notable for the  absence of resting shortness of breath, PND, orthopnea, or lower  extremity edema.  She has not had any chest pain, tachypalpitations, nor  dizzy spells.  She does notice that she remains quite dependent on oral  Lasix, and if she cuts her dose, she starts gaining weight almost  immediately.   CURRENT MEDICATIONS:  1. Folic acid 1 mg daily.  2. Lasix 40 mg daily alternating with 80 mg daily.  3. Potassium chloride 40 mEq twice daily.  4. Ultram as needed for pain.  5. Exjade 500 mg 3 tablets daily.  6. Allegra 180 mg daily.  7. Coumadin 5 mg daily.  8. Zolpidem 10 mg daily.  9. Advair Diskus 250/50 one puff twice  daily.  10.Nasonex spray.  11.Vitamin D.  12.Caltrate.  13.Amiodarone 200 mg daily.  14.Toprol-XL 25 mg daily.   PHYSICAL EXAMINATION:  GENERAL:  Notable for [name] well-appearing obese  female.  VITAL SIGNS:  Blood pressure 138/76, pulse 75 and regular, and oxygen  saturation 95% on room air.  HEENT:  Unrevealing.  NECK:  Supple.  There is no palpable lymphadenopathy.  CHEST:  Auscultation of the chest demonstrates clear breath sounds,  which are symmetrical bilaterally.  No wheezes or rhonchi are noted.  CARDIOVASCULAR:  Notable for regular rate and rhythm.  There is [name] soft  grade 2/6 systolic murmur heard best at the apex.  This is less  prominent than I recall noticing it in the past.  No diastolic murmurs  are noted.  ABDOMEN:  Obese, soft, nontender.  EXTREMITIES:  Warm and well perfused.  There is no lower extremity  edema.   IMPRESSION:  The patient is doing well on medical therapy.  She still  remains diuretic-dependent, but overall, she is getting along quite well  and she is maintaining sinus rhythm.   PLAN:  I have discussed matters at length with the patient and her  husband here in the office today.  I suspect that it would make sense to  get [name]  followup echocardiogram to reevaluate the severity of mitral  regurgitation.  If the mitral regurgitation is at least moderately  severe on chest wall echo, then it would not be unreasonable to consider  elective mitral valve repair/replacement and maze procedure.  On the  other hand, if the mitral valve is really not leaking much, one could  make an argument for continued medical therapy.  We will plan to see her  back in 2 weeks' time after followup echocardiogram per Dr. Mayford Knife.   Salvatore Decent. Cornelius Moras, M.D.  Electronically Signed   CHO/MEDQ  D:  06/07/2008  T:  06/07/2008  Job:  [id number]   cc:   Armanda Magic, M.D.  Candyce Churn, M.D.

## 2010-10-10 NOTE — Assessment & Plan Note (Signed)
OFFICE VISIT   Amanda [name], Amanda [name]:  1951/05/28                                        August 02, 2009  CHART #:  [id number]   HISTORY OF PRESENT ILLNESS:  The patient returns for followup status  post right miniature thoracotomy for mitral valve repair and Cox  CryoMaze procedure on July 20, 2008.  She was last seen here in the  office on January 24, 2009.  Since then, she reports she has seen some  improvement in her exercise tolerance, but overall she still remains  impaired with significant exertional shortness of breath and fatigue.  She states that because of this, she simply cannot go back to her  previous job, and she is in the process of establishing long-term  disability.  She states that over the winter months, she had problems  with recurrent upper respiratory tract infections.  She has now been  referred for [name] formal pulmonary consultation, and she apparently is  scheduled to see Dr. Marcelyn Bruins in the near future.  She continues to  follow up with Dr. Mayford Knife, and she has now been off amiodarone for  several months.  She remains on Coumadin.  She has not had any tachy  palpitations.  She has not had any chest pain.  The remainder of her  review of systems unremarkable.   CURRENT MEDICATIONS:  Folic acid, Lasix, potassium chloride, Ultram,  Exjade, Advair Diskus, Nasonex spray, vitamin D, Toprol-XL, Allegra,  Coumadin, clonazepam.   PHYSICAL EXAMINATION:  General:  [name] well-appearing female.  Vital Signs:  Blood pressure 117/75, pulse 83 and regular, oxygen saturation 92% on  room air.  Chest:  Auscultation of the chest demonstrates clear breath  sounds and in fact, she has pretty good aeration at the right lung base.  For the most part, I think her breath sounds are essentially almost  symmetrical at this point in time, and I am impressed that she has  considerably improved air movement on the right side.  Cardiovascular:  Regular rate  and rhythm.  No murmurs, rubs or gallops are noted.  Abdomen:  Soft, nontender.  Extremities:  Warm and well perfused.  There  is no lower extremity edema.   DIAGNOSTICS TEST:  Chest x-ray performed today at the [facility] is reviewed.  This demonstrates the fact that she has much  improved aeration in the right lung and the right diaphragm has moved  down to normal position.   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  She clearly  is moving better air on the right side and she has pretty good breath  sounds on exam.  She does not have any murmur on physical exam.  She  still complains that she has significant exercise impairment, which  presumably must be multifactorial in origin.   PLAN:  We will have the patient return to see [place] in 1 year.  [name] repeat  sniff test under flouroscopy could be performed to more formally  evaluate the function of the diaphragm.  However, I suspect that it  would not change things her management at this point in time.  All of  her questions have been addressed.  Salvatore Decent. Cornelius Moras, M.D.  Electronically Signed   CHO/MEDQ  D:  08/02/2009  T:  08/03/2009  Job:  [id number]   cc:   Armanda Magic, M.D.  Candyce Churn, M.D.  Leighton Roach. Truett Perna, M.D.  Barbaraann Share, MD,FCCP

## 2010-10-10 NOTE — Assessment & Plan Note (Signed)
OFFICE VISIT   [name], [name]  DOB:  [birth year]                                        March 25, 2008  CHART #:  [id number]   The patient is [name] woman, who had an ablation for atrial  fibrillation at [facility].  She presented to [facility] with  shortness of breath and back, shoulder, and arm pain.  She was found to  have [name] large pericardial effusion.  She underwent drainage of the  effusion on March 03, 2008, with the subxiphoid window, it was [name] bloody  effusion.  There was an extreme amount of inflammation within the  pericardium.  Pathology was consistent with acute inflammation.  She  states that at the present time, she is doing well.  She still does have  shortness of breath, which she thinks maybe related to her heart rate.  She remains in atrial fibrillation.  She has no pain in her incision,  has not had any drainage.  She is not having any of the back, shoulder,  or arm pain that she was having prior to the procedure.  She is back on  her Coumadin.   PHYSICAL EXAMINATION:  GENERAL:  The patient is [name] white  female in no acute distress.  VITAL SIGNS:  Blood pressure is 136/92; pulse 111, atrial fib,  irregular; respirations 18; and her oxygen saturation is 94% on room  air.  SKIN:  Incision is clean, dry, and intact.  There is some irritation at  the lower portion of the incision likely suture reaction.  No evidence  of infection.  CHEST:  The chest tube site is well healed.   Chest x-ray shows cardiomegaly, but the cardiac silhouette is consistent  with the film from March and much smaller than the cardiac silhouette on  her films at the time of presentation.   IMPRESSION:  The patient is [name] woman.  She is status post  pericardial window for acute pericarditis.  She had about 400 mL  effusion, which was drained.  She has no evidence of recurrence.  She is  doing well at this point in time.  I  encouraged her to avoid lifting  heavy  objects for another 2 weeks other than that her activities are  unrestricted.  She will follow up on [name] p.r.n. basis.  She has an  appointment with Dr. Armanda Magic tomorrow.   Salvatore Decent Dorris Fetch, M.D.  Electronically Signed   SCH/MEDQ  D:  03/25/2008  T:  03/26/2008  Job:  [id number]   cc:   Armanda Magic, M.D.  Candyce Churn, M.D.

## 2010-10-10 NOTE — Op Note (Signed)
NAME:  [name], [name]             ACCOUNT NO.:  [id number]   MEDICAL RECORD NO.:  [id number]          PATIENT TYPE:  AMB   LOCATION:  ENDO                         FACILITY:  [facility]   PHYSICIAN:  Armanda Magic, M.D.     DATE OF BIRTH:  March 08, 1951   DATE OF PROCEDURE:  03/30/2008  DATE OF DISCHARGE:                               OPERATIVE REPORT   PROCEDURE:  Transesophageal echo with color flow Doppler and direct  cardioversion.   OPERATOR:  Armanda Magic, MD   INDICATIONS:  Shortness of breath with atrial flutter.   COMPLICATIONS:  None.   MEDICATIONS:  Propofol 90 mg IV, fentanyl 62.5 mg IV, and Versed 4 mg  IV.   The patient initially underwent transesophageal echo, which showed  normal LV systolic function, EF 55-60%.  The left atrium was moderate to  markedly enlarged, but there was no evidence of left atrial or left  atrial appendage clot.  The mitral valve apparatus revealed question of  prolapse of the median scallop of the posterior mitral valve leaflets  with moderate mitral regurgitation eccentrically directed towards the  anterior left atrial wall.  There was evidence of mild to moderate  tricuspid regurgitation and mildly dilated right atrium.  There was also  evidence of a small patent foramen ovale by color flow Doppler with left-  to-right shunting and this was confirmed by agitated saline contrast  with left-to-right shunting.  There was no evidence of right-to-left  atrial shunting.  Pulmonic valve was normal.  The aortic valve was  trileaflet with mild aortic valve sclerosis and trivial aortic  insufficiency.  The thoracic aorta appeared normal.   After the transesophageal echo was performed revealing no evidence of  atrial clot, defibrillation pads were placed on the patient's anterior  and posterior left chest and anesthesia was given.  After adequate  anesthesia was obtained, a synchronized 120 joules biphasic shock was  delivered, which successfully  converted the patient to normal sinus  rhythm.  The patient tolerated the procedure well without complications  and subsequently went home after she was fully recovered.   ASSESSMENT:  1. Atrial flutter, status post direct current cardioversion to sinus      rhythm.  2. Systemic anticoagulation with therapeutic INR.  3. Moderate mitral regurgitation with mild mitral valve prolapse of      the posterior mitral leaflet.  4. Shortness of breath secondary to atrial flutter with diastolic      dysfunction.   PLAN:  Continue temporal, Cardizem, flecainide, and Coumadin.  Follow up  with Dr. Clydie Braun in 1-2 weeks.  His office will call to  schedule this.      Armanda Magic, M.D.  Electronically Signed     TT/MEDQ  D:  03/30/2008  T:  03/31/2008  Job:  [id number]   cc:   Mick Sell, MD

## 2010-10-10 NOTE — Assessment & Plan Note (Signed)
OFFICE VISIT   [name]  DOB:  [birth year]                                        January 24, 2009  CHART #:  [id number]   HISTORY OF PRESENT ILLNESS:  The patient returns to the office today for  further followup status post right miniature thoracotomy for mitral  valve repair and Maze procedure on July 20, 2008.  She was last seen  here in the office November 01, 2008.  Since then, she has remained stable  and in fact she reports some mild symptomatic improvement in her  exertional shortness of breath.  However, she still gets significantly  short of breath particularly if she goes up and down [name] flight of stairs.  With regular daily activities and walking, she is doing better, but the  progress has been gradual and slow.  She has not had any  tachypalpitations or symptoms to suggest recurrent atrial arrhythmias,  and she underwent [name] 30-day outpatient monitor, which confirmed the  absence of any significant atrial arrhythmias.  Since this was  performed, she has not yet been seen in followup by Dr. Mayford Knife and she  remains on both amiodarone and Coumadin at this time.  She otherwise  feels well and has no specific complaints.   PHYSICAL EXAMINATION:  General:  Notable for well-appearing female.  Vital Signs:  Blood pressure 131/85, pulse 77, oxygen saturation 92% on  room air.  Chest:  Examination of the chest reveals clear breath sounds  bilaterally.  Breath sounds are somewhat diminished at the right lung  base, but with deep inspiration the patient is definitely moving air on  the right side and I think this is [name] noticeable improvement.  Cardiovascular:  Notable for regular rate and rhythm.  No murmurs, rubs,  or gallops are noted.  Abdomen:  Soft, nontender.  Extremities:  Warm  and well perfused.  There is no lower extremity edema.   DIAGNOSTIC TEST:  Chest x-ray performed today at the [facility] is reviewed.  This demonstrates  slight increased aeration at the  right lung base, but still some persistent mild elevation of the right  hemidiaphragm with associated right lower lobe volume loss.  There were  no pleural effusions.  There is no pulmonary edema.  No other  abnormalities are identified.   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  She appears to be maintaining sinus rhythm and is  clinically otherwise stable.   PLAN:  I would recommend stopping amiodarone as soon as practical to  avoid any potential long-term toxicity related to this medication.  If  she remains stable off her amiodarone, she will ultimately be able to  stop Coumadin.  Hopefully over the next 6 months, she will continue to  enjoy some slow gradual improvement in her respiratory capacity.  We  will see her back in 6 months' time for repeat chest x-ray and to check  to see how her heart rhythm is doing.   Salvatore Decent. Cornelius Moras, M.D.  Electronically Signed   CHO/MEDQ  D:  01/24/2009  T:  01/25/2009  Job:  [id number]   cc:   [name], M.D.  Candyce Churn, M.D.  Leighton Roach. Truett Perna, M.D.

## 2010-10-10 NOTE — H&P (Signed)
NAME:  [name], [name] NO.:  [id number]   MEDICAL RECORD NO.:  [id number]          PATIENT TYPE:  INP   LOCATION:  0108                         FACILITY:  [facility]   PHYSICIAN:  Ramiro Harvest, MD    DATE OF BIRTH:  05/28/1951   DATE OF ADMISSION:  10/07/2007  DATE OF DISCHARGE:                              HISTORY & PHYSICAL   The patient's primary care physician is Dr. Johnella Moloney of [facility].   The patient's oncologist is Dr. Truett Perna.   The patient's cardiologist is Dr. Mayford Knife.   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  The  patient denied any nausea or vomiting, no change in chronic shortness of  breath, no chest pain, no palpitations, no abdominal pain, no dysuria,  no melena, no hematochezia, no hematemesis, no neurological deficits.  The patient was found to have a right upper lobe pneumonia with a white  count this morning of 36.2.  The patient was given a dose of IV Rocephin  and azithromycin and discharged home with a 5-day course of Zithromax.  The patient's PCP and oncologist called the patient and sent the patient  back to the ED to get admitted.  On return to the ED, the patient had a  white count of 42.8, EKG with atrial flutter waves with a rate in the  90s.  We were called to admit the patient for further evaluation and  management.  On follow-up in the ED, the patient's temperature was 99.3.   ALLERGIES:  THE PATIENT IS ALLERGIC TO SHRIMP, CRAB, IODINE.  SKIN  TESTING WITH REACTION TO TREES, GRASS, WEEDS, POLLEN, DOG AND CAT  DANDER, FEATHERS, HOUSE DUST, DUST MITES,  COCKROACHES, AND MOLD.   PAST MEDICAL HISTORY:  1. 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  2. Multiple environmental allergies.  3. Paroxysmal atrial fibrillation in sinus rhythm with sotalol on      chronic Coumadin, initially diagnosed in 2005.  4. Borderline hypertension.  5. 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  6. Moderate mitral regurgitation with mild murmur.  7. Asthma.  8. Surgical asplenia.  9. CHF in 1997 secondary to high output failure secondary to anemia.  10.History of depressed mood in the past since resolved.  11.Menopause.  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  13.NSAID-induced gastritis versus peptic ulcer in December 2002.  14.Status post splenectomy in 1953.  15.Status post cholecystectomy in 1998.  16.Status post laparoscopic surgery for removal of uterine lesions per      Dr. Thomasena Edis.  17.Status post umbilical hernia repair per Dr. Samuella Cota in 1997.   HOME MEDICATIONS:  1. Ultram 50 mg p.o. t.i.d.  2. Folic  acid 1 mg daily.  3. Allegra 180 mg every night.  4. Advair 50/250 one inhalation b.i.d.  5. Coumadin 2.5 mg daily and 3.75 mg on Saturdays.  6. Calcium 600 mg with vitamin D b.i.d.  7. Sotalol 160 mg b.i.d.  8. Exjade 500 mg t.i.d.  9. Nasonex spray in each nostril daily.  10.Zithromax Z-Pak which was started on Oct 07, 2007.   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  She  is married with three children, one daughter with allergic rhinitis and  allergies to mold and two sons all of whom are healthy.  The patient  denies any tobacco use, occasional alcohol use with a glass of red wine  per month.  No IV drug use.   FAMILY HISTORY:  Of hypertension, rheumatoid arthritis, breast cancer.  Mother deceased age 26 from a brain stem CVA.  Also had a history of  breast cancer diagnosed at age 2,  atrial fibrillation, and a probable  prior TIA.  The patient's father deceased age 7 from melanoma and also  had a history of Alzheimer's.  The patient has three siblings, one with  hypertension and one sister with allergy with no therapy.   REVIEW OF SYSTEMS:  Significant for arthralgias, exertional unchanged  shortness of breath, atypical chest pain.  The patient had been on a  heart monitor for atrial fibrillation.  Otherwise per HPI.   PHYSICAL EXAMINATION:  Temperature 99.3, blood pressure 116/68, pulse of  85, respiratory rate 20, saturating 93% on 2 liters nasal cannula to 97%  room air.  GENERAL:  The patient on gurney in no apparent distress.  HEENT:  Normocephalic, atraumatic.  Pupils equal, round and reactive to  light.  Extraocular movements intact.  Oropharynx is clear.  No lesions,  no exudates.  NECK:  Supple.  No lymphadenopathy.  RESPIRATORY:  Lungs are clear to auscultation bilaterally.  No wheezes,  no rhonchi.  No crackles.  CARDIOVASCULAR:  Irregularly irregular.  ABDOMEN:  Is soft, nontender, nondistended.  Positive bowel sounds.  EXTREMITIES:  No clubbing, cyanosis or edema.  NEUROLOGIC:  The patient is alert and oriented x3.  Cranial nerves II-  XII grossly intact.  No focal deficits.  Sensation is intact.  5/5  bilateral upper extremity strength, 5/5 bilateral lower extremity  strength.  Cerebellum is intact.  Gait not tested.   LABORATORY DATA:  CBC:  White count 42.8 from 36.2, hemoglobin 8.9,  platelets 524, hematocrit 27.4, ANC of 39.0, ANC of 111.2.  ISTAT sodium  136, potassium 3.3, chloride 101, BUN 18, creatinine 1.1, glucose of  120.  PT 29.5, INR 2.7.  Influenza A and B negative.  Chest x-ray with a  right upper lobe pneumonia.   ASSESSMENT AND PLAN:  [name] is a 60 year old female with a  history of splenectomy, pyruvate kinase deficiency, paroxysmal atrial  fibrillation who presents to the emergency room with fevers of a T-max  of  104.3, nonproductive cough, chills, and diaphoresis, found to have a  right upper lobe pneumonia.   1. Right upper lobe pneumonia, likely community-acquired pneumonia.      The patient is status post a splenectomy and as such is at risks      for encapsulated organisms.  We will check a sputum Gram stain and      culture.  Check blood cultures x2.  Check a CMET.  Check a UA      cultures and sensitivities.  Check a urine Legionella antigen and a      urine pneumococcus antigen.  Will place on oxygen and treat      empirically with Rocephin which should cover against encapsulated      organisms and azithromycin for atypicals.  Will place on nebs as      needed, Mucinex for cough, and will follow.  2. Atrial fibrillation/atrial flutter.  EKG with flutter waves.      However, rate less than 100.  Patient asymptomatic.  Continue home      regimen of sotalol for rate control and Coumadin for      anticoagulation.  May consider consulting with cardiology in the      morning for the patient's flutter waves.  Will monitor on      telemetry.  3. Asthma, stable.  Continue Advair, Xopenex and Atrovent and nebs as      needed.  4. Hypokalemia.  We will check a magnesium level and replete      potassium.  5. Pyruvate kinase deficiency with hemochromatosis.  Continue home      dose Exjade.  Will inform oncology of admission in the morning.  6. Hemolytic anemia.  Baseline hemoglobin of 8-9.  Hemoglobin is      stable.  Continue home dose folic acid and follow.  7. 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      Continue home dose Ultram.  8. Prophylaxis Protonix for gastrointestinal prophylaxis.  Coumadin      for deep vein thrombosis prophylaxis.   It was a pleasure taking care of [name].      Ramiro Harvest, MD  Electronically Signed     DT/MEDQ  D:  10/07/2007  T:  10/07/2007  Job:  [id number]   cc:   Candyce Churn, M.D.  Fax: [phone]   Leighton Roach  Truett Perna, M.D.  Fax: [phone]   Armanda Magic, M.D.  Fax: [phone]

## 2010-10-10 NOTE — Assessment & Plan Note (Signed)
OFFICE VISIT   [name], [name]  DOB:  1951/03/07                                        September 08, 2008  CHART #:  [id number]   The patient return for further followup, status post right miniature  thoracotomy for mitral valve repair and maze procedure.  She was last  seen here in the office on August 18, 2008.  Since then, she has done  fairly well, although she still complains of significant exertional  shortness of breath.  This is bad enough that she has not been able to  go back to work and her employer will not have her return until her  breathing has improved substantially.  She denies any fevers or chills.  She denies any productive cough.  She has not had any tachypalpitations  to suggest recurrent arrhythmias.  She has no significant residual pain.  She has been seen in followup by Dr. Truett Perna, and her blood counts are  all stable.  She is scheduled to see Dr. Mayford Knife back for followup  tomorrow.  Apparently she had a followup echocardiogram performed within  the last few weeks that looked good, although we do not have the report  from that exam.   PHYSICAL EXAMINATION:  GENERAL:  A well-appearing female.  VITAL SIGNS:  Blood pressure 116/78, pulse 78 and regular, and oxygen  saturation 93% on room air.  CHEST:  Her miniature thoracotomy incision has healed nicely.  LUNGS:  Breath sounds are clear to auscultation, although she does have  diminished breath sounds at the right lung base.  No wheezes or rhonchi  are noted.  CARDIOVASCULAR:  Regular rate and rhythm.  No murmurs, rubs, or gallops  are noted.  ABDOMEN:  Soft and nontender.  The right groin incision has healed up  fine.  EXTREMITIES:  There is no lower extremity edema.  The remainder of her  physical exam is noncontributory.   DIAGNOSTIC TESTS:  Chest x-ray performed today at the [facility] is reviewed.  This demonstrates stable elevation of the right  hemidiaphragm  with very tiny right pleural effusion and some associated  right lower lobe and right middle lobe atelectasis.  The lung fields are  otherwise clear.  No other abnormalities are noted.   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  She otherwise seems to be doing  remarkably well, but she still has significant shortness of breath and  her diaphragm is high on that side.  She appears to be maintaining sinus  rhythm.   PLAN:  We will send the patient for fluoroscopic sniff test to evaluate  diaphragm function on the right side.  I have reassured her that under  the circumstances of phrenic nerve palsy should gradually resolve with  time, although could take many months.  It would be unlikely for her to  have a permanent injury, although this is not impossible.  At some point  within the next month or so, if she continues to remain in sinus rhythm,  I think it would be reasonable to wean her off of amiodarone.  If her  rhythm remains stable off amiodarone, perhaps an outpatient CardioNet  monitor might be useful, so that we could potentially eventually get her  off of Coumadin entirely.  We will plan to see her back in 8 weeks.  I  have encouraged her to increase her physical activity as tolerated  otherwise.  All of her questions have been addressed.   Salvatore Decent. Cornelius Moras, M.D.  Electronically Signed   CHO/MEDQ  D:  09/08/2008  T:  09/09/2008  Job:  [id number]   cc:   Armanda Magic, M.D.  Candyce Churn, M.D.  Leighton Roach. Truett Perna, M.D.

## 2010-10-10 NOTE — Discharge Summary (Signed)
[name], [name]             ACCOUNT NO.:  [id number]   MEDICAL RECORD NO.:  [id number]          PATIENT TYPE:  INP   LOCATION:  2037                         FACILITY:  [facility]   PHYSICIAN:  Armanda Magic, M.D.     DATE OF BIRTH:  06/12/1950   DATE OF ADMISSION:  03/01/2008  DATE OF DISCHARGE:  03/07/2008                               DISCHARGE SUMMARY   DISCHARGE DIAGNOSES:  1. Pericardial effusion, status post pericardial window.  2. Atrial fibrillation/flutter, on flecainide and diltiazem, failed      ablation.  3. Anemia.  4. Diastolic heart failure, resolved.  5. Chronic Coumadin use.  6. Hypertension.  7. Pyruvate kinase deficiency with hemochromatosis with a normal      hemoglobin of 8-9.  8. Gastroesophageal reflux disease.  9. Mild-to-moderate mitral regurgitation.   HOSPITAL COURSE:  [name] is a 60 year old female, who before this  hospital admission had a recent attempted ablation surgery for her  atrial flutter.  Past 4 days before this admission, she had a fever and  began having discomfort in the left upper portion of her back radiating  into the left arm and into the jaw.  Her symptoms were worse when she  lies flat.  Her upper back discomfort, however, seems to get better with  positional changes.  She presented to [facility] the day  before this admission, and a chest x-ray revealed enlargement of  pericardial silhouette.  There was a question of pericardial effusion.  She was evaluated in the ER by the physician on-call, who wanted to do a  cardiac echocardiogram at that time, was advised her it cannot be given  right until the morning.  She was, therefore, discharged home and  advised to follow up the next day.  She continues to have the  discomfort, but a 2-D echo in the office revealed a mild-to-moderate  pericardial effusion approximately 2 cm.  She was admitted to the  hospital for the same reason.   Ultimately, she was seen in  consultation by Dr. Charlett Lango and  required a subxiphoid pericardial window.  About 500 mL of bloody fluid  was drained from this area.   In the recovery room, she had several episodes of wide-complex  tachycardia.  Lidocaine was started by Anesthesia.  Her Cardizem was  increased to 10 mg an hour.  Her EKG revealed atrial tachycardia at a  rate of 160 beats per minute.  Dr. Verdis Prime saw the patient during  this time and felt like wide-complex tachycardia SVT with aberration  versus less likely ventricular tachycardia.  At that point, he  discontinued the lidocaine, but continued the Cardizem and resumed the  flecainide.   During the patient's hospitalization, her chronic anemia was documented,  and Dr. Kalman Drape service follows her with this.  By March 07, 2008,  it was felt safe for the patient to be discharged to home.   LABORATORY STUDIES DURING THE PATIENT'S HOSPITALIZATION:  PT 14.5, INR  1.1, sodium 138, potassium 3.9, BUN 20, and creatinine 0.84.  White  count 14,800, hemoglobin 8.6, and hematocrit  26.2, and platelets 508.  Chest x-ray showed the mediastinal pericardial catheter was no longer  present, grossly stable mediastinal contour.   DISCHARGE MEDICATIONS:  1. Percocet 5/325 one to two tablets every 4-6 hours p.r.n. pain.  2. Flecainide 150 mg p.o. b.i.d.  3. Lasix 40 mg once a day.  4. Diltiazem CD 180 mg daily.  5. Protonix 40 mg a day.  6. Advair 250/50 one inhalation twice a day.  7. Nasonex daily.  8. Allegra 180 mg a day.  9. Folic acid 1 mg daily.  10.Vitamin D daily.  11.Exjade 500 mg 3 times a day.  12.Ibuprofen 600 mg p.o. q.8 h. x1 week.   The patient is to hold Coumadin until seen by Dr. Mayford Knife.  We will go  ahead and start a baby aspirin 81 mg a day and go ahead and take  potassium 40 mEq twice a day for now.   The patient is to remain on a low-sodium heart-healthy diet.  May  shower.  Increase activity slowly.  No lifting for 2  days.  Follow up  with Dr. Antoine Primas, nurse practitioner in 1 week.  Follow up  with Dr. Dorris Fetch, his office will call the patient.       Guy Franco, P.A.      Armanda Magic, M.D.  Electronically Signed    LB/MEDQ  D:  05/11/2008  T:  05/12/2008  Job:  [id number]   cc:   Salvatore Decent. Dorris Fetch, M.D.

## 2010-10-10 NOTE — Discharge Summary (Signed)
NAME:  Amanda, [name]             ACCOUNT NO.:  [id number]   MEDICAL RECORD NO.:  [id number]          PATIENT TYPE:  INP   LOCATION:  5741                         FACILITY:  [facility]   PHYSICIAN:  Candyce Churn, M.D.DATE OF BIRTH:  Dec 10, 1950   DATE OF ADMISSION:  11/03/2006  DATE OF DISCHARGE:  11/05/2006                               DISCHARGE SUMMARY   DISCHARGE DIAGNOSES:  1. Chronic asthma with asthmatic exacerbation.  2. History of pyruvate kinase deficiency.  3. 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  4. History of atrial fibrillation.  5. Chronic allergies.  6. Hypertension.  7. Chronic pain syndrome secondary to #2.  8. Multiple allergies.  9. Surgical asplenia.   DISCHARGE MEDICATIONS:  1. Advair 250/50 inhalation b.i.d.  2. Prednisone taper 40 mg orally daily for 2 days, then decrease by 10      mg every other day until 10 mg b.i.d. has been taken, then      discontinue for a total of 20 tablets taken over 8 days.  3. Diltiazem extended release 120 mg daily.  4. Coumadin 2.5 mg daily except 1 day per week, one-half of 2.5 or      1.25 mg as per day of the week chosen as per prior to admission.  5. Allegra 180 mg daily.  6. Ultram 50 mg p.o. t.i.d.  7. Tylenol 650 mg p.o. t.i.d. p.r.n.  8. Folic acid 1 mg daily.  9. Lisinopril 10 mg daily.  10.Sotalol 120 mg p.o. b.i.d.  11.Exjade 1500 mg daily.  12.Calcium with vitamin D 12 mg daily.  13.Multivitamin daily.  14.Nasonex 1 spray into each nostril h.s. p.r.n.  15.Albuterol 2 puffs inhaled q.i.d. p.r.n. wheezing.   ALLERGIES:  Include IODINE and SHELL FISH.   HOSPITAL COURSE:  [name] is a very pleasant 60 year old  female with multiple medical issues as above.  She presented to the  urgency care center on November 03, 2006, complaining of difficulty breathing  for 24 hours.  She noticed some nasal and chest congestion couple of  days prior to that and postnasal drip on the day of admission.  She was  having worsening symptoms of sinus pressure and cough and just felt some  difficulty breathing.  She was in the office and noticed some wheezing,  and also she had a little bit of fevers at home.   She presented to the urgent care center, was referred to [facility] for further evaluation with possible pneumonia and asthmatic  exacerbation.   In the [facility] Emergency Room, she was found to have an O2 saturation  on room air of 91%, and this was after nebulizer therapy.  There was end-  expiratory wheezing on examination.  Chest x-ray revealed no obvious  infiltrates.  She did present with a slightly low hemoglobin, which is  chronic for her.  The decision was made to admit for therapy for 24 to  48 hours to make sure she was not going to acutely decompensate.   She did well on nebulizer therapy and was much improved symptomatically  on the day of discharge.  She was also treated with steroids, prednisone  30 mg b.i.d.   In terms of her atrial fibrillation, she was in sinus rhythm  hospitalized on sotalol, and INR on discharge was 2.5.   In terms of her pyruvate kinase deficiency and chronic anemia, physician  and staff were aware; stable for now.   She will be discharged home on the above medications in improved status  to be followed up in my office in 1 week or less, dependent on  outpatient symptoms.      Candyce Churn, M.D.  Electronically Signed     RNG/MEDQ  D:  11/05/2006  T:  11/05/2006  Job:  [id number]   cc:   [name], M.D.

## 2010-10-10 NOTE — Op Note (Signed)
NAME:  [name], [name]             ACCOUNT NO.:  [id number]   MEDICAL RECORD NO.:  [id number]          PATIENT TYPE:  INP   LOCATION:  2305                         FACILITY:  [facility]   PHYSICIAN:  Salvatore Decent. Cornelius Moras, M.D. DATE OF BIRTH:  08/13/1950   DATE OF PROCEDURE:  07/20/2008  DATE OF DISCHARGE:                               OPERATIVE REPORT   PREOPERATIVE DIAGNOSES:  1. Mitral regurgitation.  2. Persistent atrial fibrillation.   POSTOPERATIVE DIAGNOSES:  1. Mitral regurgitation.  2. Persistent atrial fibrillation.   PROCEDURE:  Right miniature thoracotomy for mitral valve repair (26-mm  Edwards physio II ring annuloplasty) and Cox Cryo-maze procedure.   SURGEON:  Salvatore Decent. Cornelius Moras, MD   ASSISTANT:  Coral Ceo, PA   ANESTHESIA:  General.   BRIEF CLINICAL NOTE:  The patient is a 60 year old female with history  of pyruvate kinase deficiency and chronic anemia who more recently has  been followed by Dr. Carolanne Grumbling for mitral regurgitation and atrial  fibrillation.  The patient's atrial fibrillation dates back 4 years ago.  Initially, she was controlled with medical therapy, although more  recently she has stated persistent atrial fibrillation that was  refractory to medical therapy.  She has also been treated for congestive  heart failure.  In September 2009, she underwent attempted percutaneous  ablation for treatment of atrial fibrillation at [facility]  [facility].  This procedure was never completed and  the patient developed severe class IV congestive heart failure  postoperatively.  Ultimately, she was diagnosed with pericardial  tamponade and underwent subxiphoid pericardial window.  She recovered  from this.  Subsequently, she underwent a total of three DC  cardioversions for atrial fibrillation.  Transesophageal echocardiogram  demonstrated moderate-to-severe mitral regurgitation.  There is normal  left ventricular function.   Cardiac catheterization revealed no  significant coronary artery disease.  A full consultation has been  dictated previously.  The patient now presents for definitive surgical  intervention.   OPERATIVE FINDINGS:  1. Normal left ventricular function.  2. Moderate-to-severe mitral regurgitation, predominantly functional      type I with some degenerative prolapse of the posterior leaflet      (type II).  3. No residual mitral regurgitation following successful mitral valve      repair.  4. Thick-walled left atrium.   OPERATIVE NOTE DETAIL:  The patient was brought to the operating room on  the above-mentioned date and central monitoring was established by the  Anesthesia Team under the care and direction of Dr. Kipp Brood.  Specifically, a Swan-Ganz catheter is placed through the left internal  jugular approach.  A radial arterial line is placed.  Intravenous  antibiotics were administered.  Following induction with general  endotracheal anesthesia, a Foley catheter was placed.  The patient was  placed in the supine position on the operating table with a soft roll  behind her right scapula.  The neck was gently extended and turned  towards the left.  The patient's right neck, chest, abdomen, both  groins, and both lower extremities are prepared and draped in sterile  manner.   Baseline transesophageal echocardiogram was performed by Dr. Noreene Larsson.  This demonstrates moderate-to-severe mitral regurgitation.  The mitral  regurgitation appears to consist of a broad central jet of regurgitation  due to annular dilatation (functional type 1).  However, there is also  mild prolapse of the middle scallop of the posterior leaflet.  There is  trivial aortic insufficiency.  Left ventricular function appears normal.  There is mild tricuspid regurgitation.   A small incision is made in the right inguinal crease.  The right common  femoral artery and right common femoral vein are dissected along  their  anterior surface through this small incision.  The common femoral vein  was located at the level of the entry of the greater saphenous bulb.  The femoral artery is soft and normal in appearance.   Single lung ventilation was begun.  A right anterolateral miniature  thoracotomy was performed just lateral to the right nipple.  The right  pleural space was entered through the fourth intercostal space.  Two 11-  mm ports were placed through separate stab incisions inferiorly, one of  these ports was connected to carbon dioxide insufflation.  The right  chest was insufflated throughout the remainder of the operation.   The patient was heparinized systemically.  A pursestring suture was  placed around the base of the greater saphenous bulb.  Two concentric  pursestring sutures were placed on the anterior surface of the right  common femoral artery.  The saphenous bulb was cannulated and a long  femoral guidewire was advanced through the femoral vein up through the  inferior vena cava through the right atrium into the superior vena cava  using transesophageal echocardiogram for guidance.  The femoral vein was  then dilated with serial dilators and a 22-French long femoral venous  cannula was advanced over the guidewire through the inferior vena cava  up through the right atrium into the superior vena cava, again using  transesophageal echocardiogram for guidance.  The right common femoral  artery was cannulated using the Seldinger technique through the  concentric pursestring sutures on its anterior surface.  A long  guidewire was advanced up through the femoral artery into the descending  thoracic aorta.  The femoral artery was dilated with serial dilators and  a 16-French femoral arterial cannula was advanced into the femoral  artery.  A small stab incision was made low in the right neck.  The  right internal jugular vein is cannulated.  However, despite numerous  easy entry into the  internal jugular vein, a flexible guidewire cannot  be advanced without resistance into the right atrium.  Superior vena  cava cannulation was subsequently postponed with plans to cannulate the  superior vena cava through the chest.   Cardiopulmonary bypass was begun.  Vacuum assist venous drainage is  utilized.  A longitudinal incision was made in the pericardium 2 cm  anterior to the phrenic nerve.  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  Enough of the right  atrium was exposed to identify the structures of the interatrial groove.  The undersurface of the right ventricle is identified.  The patient was  placed in Trendelenburg position.  The patient was cooled to 26 degrees  systemic temperature.  Ventricular fibrillation ensues spontaneously.   Left atriotomy incision was performed posteriorly through the  interatrial groove.  The floor of the left atrium was exposed using a  retractor blade attached to a side arm that was placed through the  fourth intercostal space just to the right side of the sternum.  Exposure was felt to be excellent and the remainder of the procedure was  completed using cold fibrillatory arrest.  There is trivial aortic  regurgitation.   The mitral valve was carefully examined.  Overall, the mitral valve  leaflets appeared essentially normal with exception of mild fibrosis of  the middle scallop of the posterior leaflet.  The subvalvular apparatus  is normal.  The mitral valve leaflets themselves are somewhat small and  there is significant annular dilatation.  Simple ring annuloplasty  appears to be sufficient for successful mitral valve repair.   The ATS medical cryo probe is utilized to complete the Cox Cryo-maze  procedure.  Each lesion was completed with 2 minutes  freeze.  Initially,  probe was placed posterior to the left atrium through the oblique sinus  until the tip of the probe extends onto the coronary sinus posteriorly.  The next lesion was placed parallel to this same lesion along the  endocardial surface of the left atrium with a tip extending on to the  posterior mitral annulus.  A lesion was then created across the back  wall of her left atrium to reach the inferior rim of the left-sided  pulmonary veins.  A similar parallel lesion was created across the dome  of the left atrium from the cephalad apex of the atriotomy incision to  reach the cephalad rim of the left-sided pulmonary veins.  The  elliptical lesion was completed around the left-sided pulmonary veins  initially with a curvilinear lesion along the anterior half and  secondarily with curvilinear lesion around the posterior half.  Finally,  a similar elliptical lesion was created around the right-sided pulmonary  veins posteriorly.  The anterior half of this ellipse is completed by  the original atriotomy incision.  This completes the entire left side  lesion set of the Cox Cryo-maze procedure.   The left atrial appendage was oversewn from within the left atrium with  a two-layer closure of running 3-0 Prolene suture.   Mitral valve ring annuloplasty was performed using interrupted 2-0  Ethibond horizontal mattress sutures placed circumferentially around the  mitral annulus.  The mitral valve was sized to accept a 26-mm  annuloplasty ring.  These dimensions correspond virtually precisely to  the dimensions of the anterior leaflet.  A 28-mm ring appears to be  considerably large and too large for this patient's valve.  An Edwards  physio II annuloplasty ring (model number 5200, serial number [id number])  was secured in place uneventfully.  After completion of the valve  repair, the valve was tested with saline.  The valve appears to be  perfectly competent with a symmetrical line  of coaptation of the  anterior and posterior leaflets.  There is no sign of any residual leak.   Rewarming was begun.  A left ventricular vent was placed across the  mitral valve into the left ventricular chamber.   The right side lesion set as a Cox Cryo-maze procedure was now  performed.  Initially, a longitudinal incision was performed along the  right lateral wall of the right atrium beginning along the right  posterolateral rim of the superior vena cava and extending down all way  to the inferior vena cava.  This was performed with two separate 2  minute freezes.  An oblique lesion was then created joining this line  posteriorly with acute margin of the heart anteriorly.  The femoral  venous cannula was pulled down until the tip was in the inferior vena  cava.  A small right atriotomy incision was performed.  A vent was then  placed up through the superior vena cava to complete adequate venous  drainage.  The final lesion sets of the right-sided maze procedure  performed endocardially.  Initially, a lesion was created from the acute  margin to the anterolateral margin of the tricuspid annulus.  A final  lesion was performed from the anteromedial surface of the tricuspid  annulus backwards to reach the tip of the right atrial appendage.  This  completes the right-sided lesion set.   The left atriotomy incision was  closed using a two-layer closure of  running 3-0 Prolene suture.  The right atriotomy incision was now closed  using a two-layer closure of running 4-0 Prolene suture.   Regular rhythm resumes spontaneously without need for cardioversion.  The lungs were ventilated briefly, after which time the left ventricular  vent was removed.  The atriotomy closure was completed.  Epicardial  pacing wires fixed to the inferior surface of the right ventricle.  Epicardial pacing wires were fixed to the right atrium.  The patient was  rewarmed to 37 degrees centigrade temperature.  The  patient was weaned  from cardiopulmonary bypass without difficulty.  The patient's rhythm at  separation from bypass initially is junctional rhythm.  AV sequential  pacing was employed.  Total cardiopulmonary bypass time for the  operation is 184 minutes.  Normal sinus rhythm resumed spontaneously  before completion of the operation and atrial pacing was employed to  increase the heart rate.   Followup transesophageal echocardiogram performed by Dr. Noreene Larsson after  separation from bypass demonstrates normal left ventricular function.  There is a well seated mitral annuloplasty ring in the mitral valve  position.  The mitral valve was functioning normally.  There is no  residual mitral regurgitation.  There is no significant gradient across  the valve with mean gradient estimated 4 mmHg.   Protamine was administered to reverse anticoagulation.  Low-dose  milrinone was added due to mild pulmonary hypertension.  The femoral  arterial and venous cannulas are removed after protamine has been  administered.  There is a palpable pulse in the distal femoral artery.  The saphenous bulb was ligated.  Meticulous hemostasis was ascertained.   Single lung ventilation was begun.  The right atrium and left atriotomy  incision were inspected for hemostasis.  The pericardial space was  drained with a 28-French Bard drain exited through the anterior port  incision.  The pericardium was reapproximated laterally.  The right  pleural space was drained with a 28-French Bard drain exited through the  posterior port incision.  The thoracotomy incision is now closed in  multiple layers in routine fashion.  The skin incision was closed with  subcuticular skin closure.  The right groin incision was inspected for  hemostasis and subsequently closed in multiple layers.  The groin  incision was also closed with subcuticular skin closure.   The patient tolerated the procedure well.  The patient was reintubated   using a single-lumen endotracheal tube after completion of the  operation.  Instrument count was incorrect and the chest x-ray was  performed in the operating room.  There is no sign of any retained  instrument.  The patient was transported to the  surgical intensive care  unit in stable condition.  There are no intraoperative complications. All sponge and needle counts were verified correct at completion of the  operation.      Salvatore Decent. Cornelius Moras, M.D.  Electronically Signed     CHO/MEDQ  D:  07/20/2008  T:  07/21/2008  Job:  [id number]   cc:   Armanda Magic, M.D.  Candyce Churn, M.D.  Leighton Roach. Truett Perna, M.D.

## 2010-10-10 NOTE — Consult Note (Signed)
NAME:  [name], [name]             ACCOUNT NO.:  [id number]   MEDICAL RECORD NO.:  [id number]          PATIENT TYPE:  INP   LOCATION:  3729                         FACILITY:  [facility]   PHYSICIAN:  Salvatore Decent. Dorris Fetch, M.D.DATE OF BIRTH:  1951-03-12   DATE OF CONSULTATION:  03/03/2008  DATE OF DISCHARGE:                                 CONSULTATION   Reason for consultation is pericardial tamponade.   HISTORY OF PRESENT ILLNESS:  [name] is a 60 year old woman with a  past medical history significant for chronic hemolytic anemia, pyruvate  kinase deficiency, and paroxysmal atrial fibrillation and flutter.  Approximately 2-1/2 weeks ago, she had an ablation for atrial flutter  done at [facility].  She is on chronic Coumadin therapy for her  atrial flutter.  Approximately 4 days prior to admission, she noted a  fever and then developed discomfort in her back, radiation to her arm,  and a pressure sensation in her chest.  Symptoms were worse with lying  flat but not exertional.  She had a chest x-ray, which revealed  enlargement of her cardiac silhouette.  She was seen by Dr. Mayford Knife.  A 2-  D echocardiogram revealed a moderate pericardial effusion with some mild  evidence of IVC compression.  She was admitted for anti-inflammatory  treatment.  She did not have any clinical signs of tamponade at that  time.  Yesterday, she was given 2 units of blood for anemia.  In this  morning, she has increased back pain and chest pressure and new onset  dyspnea.  She is being transferred to the CCU and has been given  intravenous Lasix.   PAST MEDICAL HISTORY:  Significant for chronic hemolytic anemia,  pyruvate kinase deficiency, paroxysmal atrial fibrillation flutter,  recent catheter ablation for atrial fibrillation flutter, chronic  Coumadin, hypertension, gastroesophageal reflux, and mild-to-moderate  mitral insufficiency.   MEDICATIONS ON ADMISSION:  1. Ultram 50 mg t.i.d. p.r.n.  2. Folic acid 1 mg daily.  3. Allegra 180 mg daily.  4. Nasonex spray 2 puffs each nostril daily.  5. Advair Diskus 250/50 one puff b.i.d.  6. Exjade 500 mg 3 tablets once daily.  7. Vitamin D 1200 mg daily.  8. Lasix 40 mg daily.  9. Potassium 20 mEq daily.  10.Reglan 10 mg 30 minutes before meals and at bedtime.  11.Flecainide 150 mg b.i.d.  12.Protonix 40 mg daily.   She has an allergy to [facility].   FAMILY HISTORY:  Noncontributory.   REVIEW OF SYSTEMS:  See HPI.   PHYSICAL EXAMINATION:  [name] is a 60 year old white female in  obvious discomfort.  No acute distress.  Her blood pressure is 140/90  with a 15 mmHg paradox, heart rate was 90, and respirations are 20.  Neurologically, she is alert and oriented x3.  She does have mild  jugular venous distention.  Cardiac exam has indistinct heart sounds.  Lungs have trace crackles at bases.  Abdomen is soft and nontender  without ascites.  Extremities are without clubbing, cyanosis, or edema.  Her pulses are intact.   LABORATORY DATA:  Echocardiogram reviewed with Dr.  Smith.  There is a  moderately large pericardial effusion with no overt tamponade on the  echocardiogram from March 02, 2008.  Chest x-ray shows large amount of  the cardiac silhouette.  Her PT today is 17.5 with an INR of 1.4.  Hematocrit yesterday was 22.7.  She has received 2 units of bloods since  then platelet count 628 and white count 13.9.  Sodium 136, potassium  4.5, BUN and creatinine are 10 and 0.8.  LFTs had an elevated bilirubin  at 2.3 and elevated SGOT of 52.  BNP on March 01, 2008, was 113.   IMPRESSION:  [name] is a 60 year old woman, status post recent  attempted atrial fibrillation ablation at [facility] about 2-1/2  weeks ago.  She presents with a 4-5day history of back and arm pain as  well as moderately large pericardial effusion by echo, now has dyspnea  and signs of impending tamponade.  She is not in shock currently.  She   was advised to undergo subxiphoid pericardial window for fluid drainage  for diagnostic and therapeutic purposes.  The indications, risks,  benefits, alternatives, and general conduct of the procedure were  discussed with the patient.  She understands the risks included but not  limited to death, cardiac injury, bleeding, possible need for  transfusions, infections, DVT, PE, as well as other unforeseeable  complications.  She understands and accepts these risks and agrees to  proceed.   There is currently no operating room available.  We will proceed with  subxiphoid pericardial window this afternoon since an operating room  opens up.      Salvatore Decent Dorris Fetch, M.D.  Electronically Signed     SCH/MEDQ  D:  03/03/2008  T:  03/03/2008  Job:  [id number]   cc:   Lyn Records, M.D.  Armanda Magic, M.D.
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
## 2010-10-10 NOTE — Assessment & Plan Note (Signed)
OFFICE VISIT   [name], [name]  DOB:  1950/12/01                                        March 12, 2008  CHART #:  [id number]   CURRENT PROBLEM:  1. Status post subxiphoid pericardial window by Dr. Dorris Fetch on      March 03, 2008, for pericardial effusion and tamponade.  2. Chronic hemolytic anemia.  3. Chronic Coumadin therapy for atrial fibrillation/flutter, status      post attempted catheter-based ablation at [facility].   The patient is [name] pleasant 60 year old female, who returns to the office  for wound check and chest tube suture removal.  She is feeling much  improved after drainage of her pericardial effusion.  She has had return  of her energy and no significant shortness of breath.  The chest tube  sutures removed.  The main incision in the chest tube site are healing  without cellulitis or infection.  The cardiac exam is regular rhythm  without rub.   She complains now of dysuria, frequency, and UTI symptoms and she will  be given [name] prescription for Cipro 500 mg one p.o. b.i.d. x7 days.   She plans on returning to our office for her scheduled postop visit with  chest x-ray in approximately 12-14 days.   Kerin Perna, M.D.  Electronically Signed   PV/MEDQ  D:  03/12/2008  T:  03/13/2008  Job:  [id number]

## 2010-10-10 NOTE — Assessment & Plan Note (Signed)
OFFICE VISIT   [name], [name]  DOB:  06-23-1950                                        November 01, 2008  CHART #:  [id number]   HISTORY OF PRESENT ILLNESS:  [name] returns to the office today for  further followup status post right miniature thoracotomy for mitral  valve repair and cryo-maze procedure on July 20, 2008.  She was last  seen here in the office on September 08, 2008.  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  Since  then, the patient has remained stable.  She still has not developed any  improvement in her shortness of breath.  She is a little bit depressed.  She is out of work on disability now.  She has not had any tachy  palpitations or a symptom to suggest a recurrence of atrial  fibrillation.  She has been seen in followup by Dr. Mayford Knife and is  scheduled to undergo a 30-day CardioNet monitor study.  She remains on  amiodarone.  She remains on Coumadin, and has been managing this without  difficulty.  She states that she has had some intermittent mild swelling  of her right lower extremity.  This is not always present and seems to  come and go sporadically.  She has no other complaints.   CURRENT MEDICATIONS:  Folic acid, furosemide, potassium chloride,  EXJADE, Ultram, zolpidem, Advair Diskus, Nasonex spray, vitamin D,  Caltrate, amiodarone, Toprol-XL, albuterol, Allegra, and Coumadin.   PHYSICAL EXAMINATION:  Notable for a well-appearing female with blood  pressure 123/72, pulse 72 and regular, oxygen saturation is 91% on room  air.  Auscultation of the chest reveals clear breath sounds.  There  remains somewhat diminished breath sounds at the right lung base, but I  believe that on exam her aeration seems better, and she is moving air  better on the right side than I recall at the time of her most recent  physical  exam.  Cardiovascular exam is notable for regular rate and  rhythm.  No murmurs, rubs, or gallops are noted.  The abdomen is soft,  nontender.  The extremities are warm and well perfused.  There is no  lower extremity edema.  The right groin incision has healed nicely, and  there is no palpable fluid collection.   DIAGNOSTIC TESTS:  Chest x-ray performed today at the [facility] is reviewed and compared with the last previous exam from 6 weeks  ago.  This demonstrates stable atelectasis at the right lung base with  elevation of the right hemidiaphragm.  This has not changed appreciably.   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  This is probably a thermal injury related to the  cryoprobe used for the maze procedure.  On physical exam, I believe that  aeration in the right lung base has improved, although her chest x-ray  has not yet changed much.  I am hopeful that with time this will  continue to resolve.  The patient remains in sinus rhythm and does not  have any physical signs of volume overload or congestive heart failure.  A 30-day CardioNet monitor has been ordered by Dr. Mayford Knife, and I  suspect  that she will try stopping amiodarone depending upon the results of this  test.  The swelling in the right lower extremity may be related to  venous cannulation at the time of surgery, and a venous duplex scan  could be performed to rule out deep venous thrombosis.  However, this  would intuitively seem unlikely given that the patient has remained  anticoagulated with Coumadin, and therapy would not be altered..  At  this point, there is very little if any lower extremity edema.   PLAN:  We will plan to see her back in 12 weeks with a followup chest x-  ray and see how she is getting along.  We will get results of her recent  echocardiogram performed at Dr. Norris Cross office.  All of her questions  have been addressed.     Salvatore Decent. Cornelius Moras, M.D.  Electronically Signed   CHO/MEDQ  D:  11/01/2008  T:  11/02/2008  Job:  [id number]   cc:   Armanda Magic, M.D.  Candyce Churn, M.D.  Leighton Roach. Truett Perna, M.D.

## 2010-10-10 NOTE — Op Note (Signed)
[name], [name]             ACCOUNT NO.:  [id number]   MEDICAL RECORD NO.:  [id number]          PATIENT TYPE:  INP   LOCATION:  2305                         FACILITY:  [facility]   PHYSICIAN:  Guadalupe Maple, M.D.  DATE OF BIRTH:  04/11/1951   DATE OF PROCEDURE:  07/20/2008  DATE OF DISCHARGE:                               OPERATIVE REPORT   PROCEDURE:  Intraoperative transesophageal echocardiography.   [name] is a 60 year old white female with history of a  moderate-to-severe mitral regurgitation, is scheduled to undergo mitral  valve repair by Dr. Cornelius Moras.  Intraoperative transesophageal  echocardiography was requested to evaluate the mitral valve to assist  with the repair to assess any other valves for any additional pathology  and to serve as a monitor for intraoperative volume status.   The patient was brought to the operating room at [facility],  and general anesthesia was induced without difficulty.  The trachea was  intubated without difficulty.  The transesophageal echocardiography  probe was then inserted into the esophagus without difficulty.   IMPRESSION:  Prebypass Findings:  1. Mitral valve.  The mitral valve leaflets appeared thickened and the      anterior wall was mildly redundant.  The posterior wall was      thickened and had an area in the P2 region, which appeared to      prolapse slightly above the annular plane.  There was no torn      chordae or flail segments appreciated.  There was a central jet of      mitral regurgitation, which was present along the coaptation line      in 2 places in the area of the posterolateral commissure and the      area of the P1 and the area close to the anterolateral commissure.      This mitral regurgitation was graded as moderate.  2. Aortic valve.  The aortic valve was trileaflet and opened normally.      The leaflets were not calcified and there was trace aortic      insufficiency.  3. Left  ventricle.  The left ventricle appeared mildly dilated.  There      was good contractility and all segments interrogated.  Ejection      fraction is estimated at 55%.  4. Right ventricle.  The right ventricular size appeared to be within      normal limits with good contractility of the right ventricular free      wall.  5. Tricuspid valve.  The tricuspid valve appeared structurally intact.      There was 1+ tricuspid insufficiency.  6. Interatrial septum.  The interatrial septum was intact without      evidence of patent foramen ovale by color Doppler or bubble study.  7. Left atrium.  The left atrial cavity was enlarged.  There was no      thrombus noted in the left atrial and left atrial appendage.  8. Ascending aorta.  The ascending aorta showed a well-defined      sinotubular ridge, was not aneurysmal, and there was no  significant      atheromatous disease appreciated.  9. Descending aorta.  The descending aorta measured 2 cm in diameter      and showed no significant atheromatous disease.   Postbypass Findings:  1. Mitral valve.  There was an annuloplasty ring in the mitral      position.  The anterior leaflet was freely mobile and the posterior      leaflet was fixed.  There was no residual mitral regurgitation.      The continuous wave Doppler interrogation of the mitral inflow      revealed a mean gradient of 4 mmHg.  There was no evidence of SAM,      and there was no gradient noted across the aortic outflow tract by      continuous wave Doppler.  2. Aortic valve.  The aortic valve was unchanged from the prebypass      study.  There was trace aortic insufficiency.  The leaflets opened      normally and there was no calcification of the leaflets noted.  3. Left ventricle.  The left ventricular function again showed      ejection fraction approximately 55-60% with good contractility in      all segments interrogated.  4. Right ventricle.  The right ventricular function again  showed good      contractility of the right ventricular free wall with no evidence      of right ventricular dysfunction.           ______________________________  Guadalupe Maple, M.D.     DCJ/MEDQ  D:  07/20/2008  T:  07/21/2008  Job:  [id number]

## 2010-10-10 NOTE — Assessment & Plan Note (Signed)
OFFICE VISIT   [name], [name]  DOB:  1951-01-08                                        August 18, 2008  CHART #:  [id number]   The patient came back to the office today for further followup and wound  check.  She was last seen here in the office ten days ago.  Since then  she has continued to gradually improve.  She still has exertional  shortness of breath.  However, she is no longer requiring oxygen  therapy, but her breathing continues to gradually improve.  Home health  nurses were concerned about the area of cellulitis in the right neck.  For that reason, she wanted to see [place] in the office today.  She has not  had any fevers or chills.  She has had minimal pain in her chest that  has continued to improve.  She still has some numbness along the medial  aspect of her right thigh related to her cannulation incision.  This is  stable and improving with less pain and more numbness and paresthesias.  She has no other complaints.   PHYSICAL EXAMINATION:  GENERAL:  A well-appearing female.  VITAL SIGNS:  Blood pressure 126/80, oxygen saturation 92% on room air.  NECK:  Palpation of the right neck reveals a small stab incision at the  base of the right neck that is healing appropriately.  There is mild  cellulitis around this, but on exam this is much better than it looked  last time I saw her 10 days ago.  The wound is cleansed with peroxide in  order to make sure there is no retained suture material.  I did not  appreciate any retained suture material or other foreign body in the  small wound and there was minimal surrounding cellulitis.  The right  thoracotomy incision is also healing nicely.  The remainder of her  physical exam is notable for somewhat diminished breath sounds at the  right lung base, which is unchanged from her previous exam.   IMPRESSION:  The patient continues to improve.  There is cellulitis at  the base of the right neck, densely  resolved.  No further oral  antibiotics are needed.  I have instructed the patient to  continue to keep her right neck incision clean and apply neosporin  ointment daily. She will return to see [place] in three weeks.   Salvatore Decent. Cornelius Moras, M.D.  Electronically Signed   CHO/MEDQ  D:  08/18/2008  T:  08/19/2008  Job:  [id number]   cc:   Leighton Roach. Truett Perna, M.D.  Candyce Churn, M.D.

## 2010-10-10 NOTE — Assessment & Plan Note (Signed)
OFFICE VISIT   [name]  DOB:  1950-11-24                                        August 06, 2008  CHART #:  [id number]   The patient is sent to the office for examination of her incisions after  having [name] INR drawn at the [facility] at [facility].  There is  [name] concern over erythema on her surgical sites after undergoing [name] right  miniature thoracotomy for mitral valve repair by Dr. Cornelius Moras on July 20, 2008.  She also had [name] procedure.  She currently appears to be  maintaining sinus rhythm and has no symptoms whatsoever of congestive  heart failure.  She denies fever.  She is [name] nondiabetic.  She is on  amiodarone.   PHYSICAL EXAMINATION:  VITAL SIGNS:  She is afebrile.  Temperature 98,  saturation on room air 92, pulse 70 and regular, and blood pressure  100/60.  LUNGS:  Breath sounds clear with slightly diminished breath sounds on  the right base.  CARDIAC:  Rhythm is regular.  There is no cardiac murmur.   Her surgical incisions in the base of her right neck, her right breast,  her chest tube site in the right chest and her groin all have some  erythema and mild induration.  There is no fluctuance.  These all appear  to have [name] mild cellulitis.   PLAN:  She will be given [name] course of oral Keflex 500 t.i.d. for 10 days  and return for her appointment next week with Dr. Cornelius Moras.   [name], M.D.  Electronically Signed   PV/MEDQ  D:  08/06/2008  T:  08/06/2008  Job:  [id number]

## 2010-10-10 NOTE — H&P (Signed)
NAME:  [name], [name]             ACCOUNT NO.:  [id number]   MEDICAL RECORD NO.:  [id number]          PATIENT TYPE:  EMS   LOCATION:  MAJO                         FACILITY:  [facility]   PHYSICIAN:  Andres Shad. Lantos, MD     DATE OF BIRTH:  Jun 19, 1950   DATE OF PROCEDURE:  DATE OF DISCHARGE:                    STAT - MUST CHANGE TO CORRECT WORK TYPE   CHIEF COMPLAINT:  Shortness of breath   HISTORY OF PRESENT ILLNESS:  [name] is a 60 year old female with a  number of chronic medical problems including anemia, atrial  fibrillation, and allergy-induced asthma. She presented for medical  attention, when she went to an urgent care center after approximately 24  hours of difficulty breathing and upper respiratory symptoms.  She was  well until 2 days ago, when she began to notice some nasal congestion  and post nasal drip yesterday morning.  She had worsening of the  symptoms as well as sinus discomfort, nonproductive cough, and some  difficulty breathing.  The difficulty breathing was associated with  wheezing.  She did not have a fever, but she felt a little bit feverish  at her home.  She denied shaking chills.   REVIEW OF SYSTEMS:  Positive for a loss of appetite.  Negative for  fever.  Negative for chills. Negative for other GI symptoms.  Negative  for rash.   PAST MEDICAL HISTORY:  1. 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  2. Hemochromatosis.  3. Paroxysmal atrial fibrillation.  4. Hypertension.  5. Chronic pain.  6. Multiple allergies.  7. Asthma.  8. Surgical asplenia.   PAST SURGICAL HISTORY:  1. 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  2. Cholecystectomy.   VACCINATION HISTORY:  The patient receives periodic Pneumovax as well as  influenza vaccine because of her asplenia.   MEDICATIONS:  1. Coumadin 2.5 milligrams daily  2. Diltiazem 120 milligrams daily  3. Allegra 180 milligrams daily  4. Ultram 50 milligrams  three times daily  5. Tylenol three times daily  6. Folic acid 1 milligram daily  7. Lisinopril 10 milligrams daily  8. Sotalol 120 twice daily  9. Exjade 1500 milligrams daily  10.Calcium with Vitamin D 1200 milligrams daily  11.Multivitamin one daily  12.Nasonex as needed  13.Advair as needed   ALLERGIES:  IODINE and SHELLFISH.   PHYSICAL EXAMINATION:  VITAL SIGNS:  Initially at Urgent Care, the  patient's vital signs included a blood pressure of 88/52, temperature of  99.2, pulse of 78, and respiratory rate of 20. Oxygen saturation was 91%  on room air.  At Urgent Care, she received one nebulizer treatment.  Her  blood pressure was rechecked and found to be 120/59. She was sent onto  the emergency room for further evaluation.  Here, vital signs were:  Temperature 97.3, blood pressure between 99/59 and 128/57, heart rate  61, oxygen saturation 97% on 2 liters nasal cannula and 95% on room air.  She did have one reading of 91% on room air.  GENERAL APPEARANCE:  The patient was alert and in no apparent  distress.  HEENT: Mucous membranes were moist.  Oropharynx was completely  erythematous. There was some mucus seen in the retropharynx.  Nasal  turbinates were slightly erythematous.  Tympanic membranes were normal.  Conjunctivae were normal.  NECK:  Supple.  No lymphadenopathy.  LUNGS:  There was end-expiratory wheezing.  Good air movement throughout  the lung fields.  No other adventitial sounds, but sounds were  symmetrical.  CARDIOVASCULAR:  Regular S1 and S2, a 3/6 mid systolic murmur was heard  at the right upper sternal border.  ABDOMEN:  Normal bowel sounds.  Soft, nontender, nondistended.  No  organomegaly.  EXTREMITIES:  No edema. Warm, well perfused.   LABORATORY STUDIES:  Serum electrolytes were all within normal limits.  Serum creatinine was 0.67.  D-dimer was less than 0.22.  CK and troponin  were both negative.  [facility] revealed   a white blood cell count of 8.9 with 32% lymphocytes and 60%  granulocytes.  Hemoglobin was 8.3, hematocrit 25.6, and platelets  528,000.  Chest x-ray was performed. The preliminary radiology reading  revealed no acute process seen in the lung fields.  Of note, the patient  states that her baseline hemoglobin is approximately 9.6.   ASSESSMENT/PLAN:  1. Shortness of breath.  This seems to be due to an asthma      exacerbation superimposed on an acute upper respiratory infection.      There is no evidence of an acute bacterial infection despite the      patient's asplenia in the absence of leukocytosis, abnormal chest x-      ray, abnormal lung exam, or suggestive history.  I can not justify      treating the patient with antibiotics at this time.  It seems as if      her disease is most likely viral in origin with a superimposed      asthma exacerbation.  Additionally, the patient's hemoglobin is      somewhat lower than normal. This may be contributing to her      dyspnea.  However, the patient is habituated to a hemoglobin of      9.6, and I do not think it is likely that a hemoglobin of 1 gram      per deciliter or lower is likely a major contributor to her dyspnea      at this time.  I have discussed the prospect of a transfusion with      the patient, and we both agree that it is not indicated, based on      this number alone.  Plan:  I will  admit the patient to a regular      floor bed.  She will receive her regular medications as well as IV      fluids, corticosteroids, and bronchodilators.  2. Atrial fibrillation. The patient is currently in sinus rhythm. She      will remain on sotalol, diltiazem, and      Coumadin.  Her INR will be checked in the morning.  3. Pyruvate kinase deficiency and chronic anemia.  The patient's      hemoglobin is lower than baseline.  Management as discussed above      in problem #1.      Andres Shad. Rudean Curt, MD Electronically Signed     PML/MEDQ   D:  11/03/2006  T:  11/03/2006  Job:  [id number]   cc:   Candyce Churn, M.D.

## 2010-10-10 NOTE — Assessment & Plan Note (Signed)
OFFICE VISIT   [name], [name]  DOB:  Jan 27, 1951                                        June 21, 2008  CHART #:  [id number]   The patient returns for further followup of mitral regurgitation and  atrial fibrillation.  She was just seen here in the office on June 07, 2008.  Since then, she underwent followup echocardiogram at Dr.  Norris Cross office.  This confirmed the presence of at least moderate-to-  severe mitral regurgitation.  Left ventricular systolic function remains  normal.  The left atrium is severely enlarged.  No other significant  abnormalities were noted.  I had [name] long discussion with the patient and  her husband here in the office today regarding possible options.  We  discussed continued close observation and medical followup versus  proceeding with elective surgery for mitral valve repair and [name]  procedure.  She is concerned about how long she will remain on  amiodarone, and she hopes to be off of amiodarone by April to avoid the  potential for late complications related to long-term administration.  Since her last visit in our office, she was also found to be in atrial  flutter apparently when she returned to see Dr. Mayford Knife.  She is still  doing well clinically and not having problems with congestive heart  failure at present.  Given the severity of mitral regurgitation, I  suspect that it would make most sense to proceed with surgery at some  point in the not-too-distant future before the patient develops further  problems with congestive heart failure.  However, the longer we wait the  better following the events of last September and October, particularly  with respect to how much healing has occurred in and around her atrium.   After considerable discussion, the patient hopes to schedule surgery at  some point within the next 4-6 weeks.  As [name] result, we will tentatively  plan to proceed with surgery on Tuesday, July 20, 2008,  approximately 1 month from now.  She will stop taking Coumadin after  July 13, 2008, in anticipation of surgery.  I will see her back here  in the office on Monday, July 19, 2008, for final consultation.  We  discussed at length the indications, risks, and potential benefits of  surgery.  The patient and her husband both understand and accept all  potential associated risks including but not limited to risk of death,  myocardial infarction, respiratory failure, bleeding requiring blood  transfusion, recurrent arrhythmias, possible need for permanent  pacemaker placement, possible need for extended thoracotomy, or median  sternotomy.  They understand that there is [name] small chance that her valve  will not be repairable, and if this were the case, we would plan to  replace her valve using [name] mechanical prosthesis.  With her age, use of [name]  bioprosthetic valve would be concerning for the potential for late  structural valve deterioration and failure.  The relative risks and  benefits of all these approaches have been discussed in detail and all  of their questions have been addressed.   Salvatore Decent. Cornelius Moras, M.D.  Electronically Signed   CHO/MEDQ  D:  06/21/2008  T:  06/21/2008  Job:  [id number]   cc:   Armanda Magic, M.D.  Candyce Churn, M.D.  Jillyn Hidden  Doyce Loose, M.D.

## 2010-10-10 NOTE — Discharge Summary (Signed)
[name]             ACCOUNT NO.:  [id number]   MEDICAL RECORD NO.:  [id number]          PATIENT TYPE:  INP   LOCATION:  2017                         FACILITY:  [facility]   PHYSICIAN:  Salvatore Decent. Cornelius Moras, M.D. DATE OF BIRTH:  February 17, 1951   DATE OF ADMISSION:  07/20/2008  DATE OF DISCHARGE:  07/28/2008                               DISCHARGE SUMMARY   HISTORY:  The patient is a 60 year old female with a history of pyruvate  kinase deficiency and chronic anemia who more recently has been followed  by Dr. Armanda [name] for mitral regurgitation and atrial fibrillation.  The patient first developed paroxysmal atrial fibrillation approximately  4 years ago.  Additionally, she was controlled with medical therapy in  normal sinus rhythm.  More than a year ago, she developed persistent  atrial fibrillation refractory to medical therapy.  In June 2008, she  was hospitalized with congestive heart failure.  She was also noted to  have mitral regurgitation.  In September 2009, she underwent attempted  percutaneous ablation for treatment of atrial fibrillation in [facility].  The procedure was  never completed and the patient went into class IV congestive heart  failure postoperatively.  In October, she was hospitalized with  pericardial tamponade and underwent a subxiphoid pericardial window.  She recovered from this uneventfully.  She underwent an attempted DC  cardioversion on 2 occasions, both of which were unsuccessful.  In  November, she had a transesophageal echocardiogram performed.  This  demonstrated moderate-to-severe mitral regurgitation.  Left and right  heart catheterization was performed demonstrating normal coronary artery  anatomy with no significant coronary artery disease.  Pulmonary artery  pressures were mildly elevated.  The patient was referred to Tressie Stalker, MD, for surgical opinion.  Because of the recent series of  complications following percutaneous ablation for atrial fibrillations  and the subsequent pericardial tamponade, plans initially were delayed  for further healing.  The patient has done overall well with medical  therapy maintaining sinus rhythm on amiodarone.  She now presents for  hospitalization for elective surgical intervention for treatment of  underlying mitral regurgitation and atrial fibrillation.   PAST SURGICAL HISTORY:  1. Splenectomy.  2. Cholecystectomy.  3. Appendectomy.  4. Subxiphoid pericardial window for tamponade.   PAST MEDICAL HISTORY:  1. Mitral regurgitation.  2. Atrial fibrillation.  3. Hypertension.  4. Congestive heart failure.  5. Pyruvate kinase deficiency.  6. Chronic anemia.  7. History of asthma.  8. History of depression.   MEDICATIONS PRIOR TO ADMISSION:  1. Folic acid 1 mg daily.  2. Lasix 40 mg daily.  3. Potassium chloride 40 mEq twice daily.  4. Ultram 50 mg 3 times daily p.r.n. for pain.  5. Exjade 500 mg 3 times daily.  6. Allegra 180 mg daily.  7. Ambien 10 mg daily.  8. Advair Diskus 250/50 one puff twice daily.  9. Nasonex p.r.n.  10.Vitamin D daily.  11.Caltrate 1 tablet daily.  12.Amiodarone 200 mg daily.  13.Toprol-XL 25 mg daily.   ALLERGIES:  IV CONTRAST.   FAMILY HISTORY,  SOCIAL HISTORY, REVIEW OF SYSTEMS, AND PHYSICAL  EXAMINATION:  Please see the history and physical done at the time of  admission.   HOSPITAL COURSE:  The patient was admitted electively on July 20, 2008.  She was taken to the operating room, at which time she underwent  the following procedure, right miniature thoracotomy for mitral valve  repair using a 26-mm Edwards Physio II ring annuloplasty as well as Cox-  CryoMaze procedure.  This procedure was performed by Tressie Stalker, MD,  tolerated well, and she was taken to the surgical intensive care unit in  stable condition.   POSTOPERATIVE HOSPITAL COURSE:  Overall, the patient has  progressed  nicely.  She has some postoperative atrial fibrillation.  She has  subsequently been chemically cardioverted to a normal sinus rhythm for  which she is maintaining on her current regimen.  She has had  significant volume overload, but is responding well to diuresis.  She  has been restarted on her Coumadin.  She has been weaned from oxygen,  but we will require at least in short-term home usage due to the  inability to completely wean.  These arrangements will be made prior to  discharge.  She does have a moderate acute blood loss anemia.  Her  values have stabilized.  Her most recent hemoglobin and hematocrit dated  July 27, 2008, are 9.6 and 28.3 respectively.  Electrolytes, BUN, and  creatinine are within normal limits.  Her most recent INR on July 27, 2008, was 2.8.  She is tolerating gradual increase in activities using  standard cardiac rehabilitation modalities.  Her incisions are stable.  Her overall status is felt to be tentatively stable for discharge in the  morning of July 28, 2008, pending morning round reevaluation.   MEDICATIONS:  At this time for discharge will be as follows,  1. Amiodarone 200 mg daily.  2. Allegra 180 mg nightly.  3. Ambien 5 mg nightly p.r.n.  4. Coumadin, dosage to be determined at the time of discharge.  5. Ultram 50 mg q.4-6 h. p.r.n. as needed for pain.  6. Potassium, dosage to be determined at the time of discharge.  7. Lopressor 50 mg b.i.d.  8. Folic acid 1 mg daily.  9. Lasix, dosage to be determined at the time of discharge.  10.Advair 250/50 twice daily.  11.Albuterol p.r.n.  12.Nasonex daily.  13.Exjade, currently she has not been restarted on this medication.  14.Caltrate D daily.  15.Vitamin D daily.  16.Airborne daily p.r.n.   INSTRUCTIONS:  The patient will receive written instructions in regard  to medications, activity, diet, wound care, and followup.   FOLLOWUP:  Dr. Cornelius Moras on August 09, 2008.  She is also given  instructions  to follow up on Coumadin therapy with Dr. Mayford Knife as well as follow up  with cardiology follow up with Dr. Mayford Knife in 2 weeks.   CONDITION ON DISCHARGE:  Stable and improving.   FINAL DIAGNOSIS:  Mitral regurgitation and persistent atrial  fibrillation, now status post mitral valve repair as described above.  Additionally, a Cox-CryoMaze procedure, currently in normal sinus  rhythm.   OTHER DIAGNOSES:  1. Postoperative anemia.  2. Postoperative volume overload.  3. History of splenectomy.  4. History of cholecystectomy.  5. History of appendectomy.  6. History of pericardial tamponade with pericardial window.  7. History of hypertension.  8. History of congestive heart failure.  9. History of pyruvate kinase deficiency.  10.History of chronic anemia.  11.History  of asthma.  12.History of depression.      Rowe Clack, P.A.-C.      Salvatore Decent. Cornelius Moras, M.D.  Electronically Signed    WEG/MEDQ  D:  07/27/2008  T:  07/28/2008  Job:  [id number]   cc:   [name], M.D.  Salvatore Decent. Cornelius Moras, M.D.

## 2010-10-31 ENCOUNTER — Encounter (HOSPITAL_BASED_OUTPATIENT_CLINIC_OR_DEPARTMENT_OTHER): Payer: BC Managed Care – PPO | Admitting: Oncology

## 2010-10-31 ENCOUNTER — Other Ambulatory Visit: Payer: Self-pay | Admitting: Oncology

## 2010-10-31 DIAGNOSIS — D559 Anemia due to enzyme disorder, unspecified: Secondary | ICD-10-CM

## 2010-10-31 LAB — COMPREHENSIVE METABOLIC PANEL
Albumin: 3.9 g/dL (ref 3.5–5.2)
BUN: 18 mg/dL (ref 6–23)
CO2: 30 mEq/L (ref 19–32)
Calcium: 9 mg/dL (ref 8.4–10.5)
Glucose, Bld: 85 mg/dL (ref 70–99)
Potassium: 3.7 mEq/L (ref 3.5–5.3)
Sodium: 136 mEq/L (ref 135–145)
Total Protein: 7.3 g/dL (ref 6.0–8.3)

## 2010-10-31 LAB — CBC WITH DIFFERENTIAL/PLATELET
Basophils Absolute: 0.1 10*3/uL (ref 0.0–0.1)
Eosinophils Absolute: 0.3 10*3/uL (ref 0.0–0.5)
HCT: 27.1 % — ABNORMAL LOW (ref 34.8–46.6)
HGB: 8.3 g/dL — ABNORMAL LOW (ref 11.6–15.9)
MCV: 96.8 fL (ref 79.5–101.0)
NEUT#: 4.5 10*3/uL (ref 1.5–6.5)
NEUT%: 44.7 % (ref 38.4–76.8)
RDW: 17.6 % — ABNORMAL HIGH (ref 11.2–14.5)
lymph#: 3 10*3/uL (ref 0.9–3.3)

## 2010-10-31 LAB — FERRITIN: Ferritin: 16 ng/mL (ref 10–291)

## 2011-02-01 ENCOUNTER — Other Ambulatory Visit: Payer: Self-pay | Admitting: Oncology

## 2011-02-01 ENCOUNTER — Encounter (HOSPITAL_BASED_OUTPATIENT_CLINIC_OR_DEPARTMENT_OTHER): Payer: BC Managed Care – PPO | Admitting: Oncology

## 2011-02-01 DIAGNOSIS — D649 Anemia, unspecified: Secondary | ICD-10-CM

## 2011-02-01 DIAGNOSIS — R0989 Other specified symptoms and signs involving the circulatory and respiratory systems: Secondary | ICD-10-CM

## 2011-02-01 DIAGNOSIS — R0609 Other forms of dyspnea: Secondary | ICD-10-CM

## 2011-02-01 DIAGNOSIS — D559 Anemia due to enzyme disorder, unspecified: Secondary | ICD-10-CM

## 2011-02-01 LAB — COMPREHENSIVE METABOLIC PANEL
Albumin: 3.7 g/dL (ref 3.5–5.2)
BUN: 17 mg/dL (ref 6–23)
CO2: 30 mEq/L (ref 19–32)
Calcium: 9.4 mg/dL (ref 8.4–10.5)
Chloride: 98 mEq/L (ref 96–112)
Creatinine, Ser: 0.8 mg/dL (ref 0.50–1.10)
Potassium: 3.5 mEq/L (ref 3.5–5.3)

## 2011-02-01 LAB — CBC WITH DIFFERENTIAL/PLATELET
Basophils Absolute: 0 10*3/uL (ref 0.0–0.1)
Eosinophils Absolute: 0.3 10*3/uL (ref 0.0–0.5)
HCT: 25.1 % — ABNORMAL LOW (ref 34.8–46.6)
HGB: 8.1 g/dL — ABNORMAL LOW (ref 11.6–15.9)
LYMPH%: 16.5 % (ref 14.0–49.7)
MONO#: 2 10*3/uL — ABNORMAL HIGH (ref 0.1–0.9)
NEUT#: 6.8 10*3/uL — ABNORMAL HIGH (ref 1.5–6.5)
NEUT%: 62.1 % (ref 38.4–76.8)
Platelets: 479 10*3/uL — ABNORMAL HIGH (ref 145–400)
WBC: 10.9 10*3/uL — ABNORMAL HIGH (ref 3.9–10.3)

## 2011-02-21 LAB — BASIC METABOLIC PANEL
BUN: 12
CO2: 27
Calcium: 8.5
GFR calc non Af Amer: >60
Glucose, Bld: 100 — ABNORMAL HIGH
Potassium: 3.6
Sodium: 136

## 2011-02-21 LAB — CBC
HCT: 27.3 — ABNORMAL LOW
Hemoglobin: 9.2 — ABNORMAL LOW
MCHC: 33.7
Platelets: 397
RDW: 21.3 — ABNORMAL HIGH

## 2011-02-26 LAB — BASIC METABOLIC PANEL
BUN: 16
BUN: 8
CO2: 26
CO2: 28
CO2: 31
CO2: 36 — ABNORMAL HIGH
Calcium: 8 — ABNORMAL LOW
Calcium: 8.4
Calcium: 8.5
Calcium: 8.7
Calcium: 8.8
Chloride: 100
Chloride: 103
Chloride: 95 — ABNORMAL LOW
Creatinine, Ser: 0.69
Creatinine, Ser: 0.76
Creatinine, Ser: 0.84
Creatinine, Ser: 1.03
GFR calc Af Amer: >60
GFR calc Af Amer: >60
GFR calc Af Amer: >60
GFR calc non Af Amer: >60
GFR calc non Af Amer: >60
GFR calc non Af Amer: >60
Glucose, Bld: 104 — ABNORMAL HIGH
Glucose, Bld: 136 — ABNORMAL HIGH
Glucose, Bld: 136 — ABNORMAL HIGH
Glucose, Bld: 137 — ABNORMAL HIGH
Potassium: 3.4 — ABNORMAL LOW
Potassium: 5.5 — ABNORMAL HIGH
Sodium: 125 — ABNORMAL LOW
Sodium: 125 — ABNORMAL LOW
Sodium: 137
Sodium: 138

## 2011-02-26 LAB — CBC
HCT: 22.7 — ABNORMAL LOW
HCT: 27.2 — ABNORMAL LOW
Hemoglobin: 8.4 — ABNORMAL LOW
Hemoglobin: 8.6 — ABNORMAL LOW
Hemoglobin: 8.8 — ABNORMAL LOW
Hemoglobin: 8.9 — ABNORMAL LOW
MCHC: 32.5
MCHC: 32.7
MCHC: 32.8
MCHC: 33
MCV: 104.4 — ABNORMAL HIGH
MCV: 107.6 — ABNORMAL HIGH
Platelets: 555 — ABNORMAL HIGH
Platelets: 628 — ABNORMAL HIGH
RBC: 2.38 — ABNORMAL LOW
RBC: 2.54 — ABNORMAL LOW
RDW: 18.8 — ABNORMAL HIGH
RDW: 19.3 — ABNORMAL HIGH
RDW: 20 — ABNORMAL HIGH
WBC: 13.9 — ABNORMAL HIGH
WBC: 14.8 — ABNORMAL HIGH
WBC: 15.2 — ABNORMAL HIGH
WBC: 16.7 — ABNORMAL HIGH

## 2011-02-26 LAB — COMPREHENSIVE METABOLIC PANEL
ALT: 27
AST: 35
Albumin: 3.1 — ABNORMAL LOW
Albumin: 3.4 — ABNORMAL LOW
Alkaline Phosphatase: 96
BUN: 10
Calcium: 9.1
Chloride: 101
Chloride: 99
Creatinine, Ser: 0.8
GFR calc Af Amer: >60
GFR calc Af Amer: >60
GFR calc non Af Amer: >60
Potassium: 2.8 — ABNORMAL LOW
Sodium: 136
Total Bilirubin: 2.1 — ABNORMAL HIGH
Total Bilirubin: 2.3 — ABNORMAL HIGH
Total Protein: 7.6

## 2011-02-26 LAB — DIFFERENTIAL
Basophils Absolute: 0
Basophils Relative: 0
Basophils Relative: 0
Lymphocytes Relative: 5 — ABNORMAL LOW
Lymphs Abs: 3.2
Monocytes Absolute: 1
Monocytes Absolute: 2.2 — ABNORMAL HIGH
Monocytes Relative: 15 — ABNORMAL HIGH
Neutro Abs: 15.1 — ABNORMAL HIGH
Neutro Abs: 9.5 — ABNORMAL HIGH
Neutrophils Relative %: 62
Neutrophils Relative %: 89 — ABNORMAL HIGH

## 2011-02-26 LAB — B-NATRIURETIC PEPTIDE (CONVERTED LAB)
Pro B Natriuretic peptide (BNP): 113 — ABNORMAL HIGH
Pro B Natriuretic peptide (BNP): 886 — ABNORMAL HIGH

## 2011-02-26 LAB — BODY FLUID CULTURE: Gram Stain: NONE SEEN

## 2011-02-26 LAB — CROSSMATCH
ABO/RH(D): O POS
DAT, IgG: NEGATIVE

## 2011-02-26 LAB — BLOOD GAS, ARTERIAL
Acid-Base Excess: 0.1
Bicarbonate: 22
Bicarbonate: 23.9
Drawn by: 281201
FIO2: 40
MECHVT: 560
Mode: POSITIVE
O2 Saturation: 98.9
PEEP: 0
Patient temperature: 98.6
Patient temperature: 98.6
RATE: 12
TCO2: 23.3
TCO2: 25
pCO2 arterial: 42.3
pH, Arterial: 7.306 — ABNORMAL LOW
pH, Arterial: 7.335 — ABNORMAL LOW
pH, Arterial: 7.428 — ABNORMAL HIGH

## 2011-02-26 LAB — LIPID PANEL
Cholesterol: 89
HDL: 32 — ABNORMAL LOW
LDL Cholesterol: 44
Triglycerides: 66

## 2011-02-26 LAB — BODY FLUID CELL COUNT WITH DIFFERENTIAL
Eos, Fluid: 3
Lymphs, Fluid: 70
Monocyte-Macrophage-Serous Fluid: 3 — ABNORMAL LOW
Neutrophil Count, Fluid: 24
Other Cells, Fluid: 0

## 2011-02-26 LAB — PROTIME-INR
INR: 1.1
INR: 1.2
Prothrombin Time: 14.5
Prothrombin Time: 16.9 — ABNORMAL HIGH
Prothrombin Time: 26.9 — ABNORMAL HIGH

## 2011-02-26 LAB — POCT CARDIAC MARKERS
CKMB, poc: <1 — ABNORMAL LOW
Troponin i, poc: <0.05

## 2011-02-26 LAB — FERRITIN: Ferritin: 353 — ABNORMAL HIGH (ref 10–291)

## 2011-02-26 LAB — PROTEIN, BODY FLUID

## 2011-02-26 LAB — TSH: TSH: 0.447

## 2011-02-26 LAB — IRON AND TIBC
Iron: 29 — ABNORMAL LOW
Saturation Ratios: 10 — ABNORMAL LOW
TIBC: 278

## 2011-02-26 LAB — RETICULOCYTES
Retic Count, Absolute: 407 — ABNORMAL HIGH
Retic Ct Pct: 22.2 — ABNORMAL HIGH

## 2011-02-26 LAB — TISSUE CULTURE

## 2011-02-27 LAB — BASIC METABOLIC PANEL
BUN: 19
BUN: 19
BUN: 25 — ABNORMAL HIGH
CO2: 27
CO2: 29
Calcium: 9
Calcium: 9.3
Calcium: 9.5
Calcium: 9.7
Chloride: 101
Chloride: 97
Creatinine, Ser: 0.9
Creatinine, Ser: 1.07
Creatinine, Ser: 1.32 — ABNORMAL HIGH
GFR calc Af Amer: 50 — ABNORMAL LOW
GFR calc Af Amer: >60
GFR calc Af Amer: >60
GFR calc Af Amer: >60
GFR calc non Af Amer: 41 — ABNORMAL LOW
GFR calc non Af Amer: 43 — ABNORMAL LOW
GFR calc non Af Amer: 52 — ABNORMAL LOW
GFR calc non Af Amer: 54 — ABNORMAL LOW
GFR calc non Af Amer: 55 — ABNORMAL LOW
GFR calc non Af Amer: 58 — ABNORMAL LOW
Glucose, Bld: 111 — ABNORMAL HIGH
Glucose, Bld: 114 — ABNORMAL HIGH
Glucose, Bld: 94
Potassium: 2.9 — ABNORMAL LOW
Potassium: 3.3 — ABNORMAL LOW
Potassium: 3.4 — ABNORMAL LOW
Potassium: 3.4 — ABNORMAL LOW
Potassium: 3.6
Sodium: 133 — ABNORMAL LOW
Sodium: 134 — ABNORMAL LOW
Sodium: 136
Sodium: 136
Sodium: 137

## 2011-02-27 LAB — COMPREHENSIVE METABOLIC PANEL
ALT: 15
Alkaline Phosphatase: 100
CO2: 25
Chloride: 106
Glucose, Bld: 116 — ABNORMAL HIGH
Potassium: 4.2
Sodium: 138
Total Bilirubin: 1.9 — ABNORMAL HIGH
Total Protein: 7.5

## 2011-02-27 LAB — PROTIME-INR
INR: 1.1
INR: 1.1
INR: 1.2
INR: 1.4
INR: 1.7 — ABNORMAL HIGH
Prothrombin Time: 14.1
Prothrombin Time: 14.3
Prothrombin Time: 15.5 — ABNORMAL HIGH
Prothrombin Time: 17.5 — ABNORMAL HIGH
Prothrombin Time: 20.8 — ABNORMAL HIGH
Prothrombin Time: 29.8 — ABNORMAL HIGH
Prothrombin Time: 30.5 — ABNORMAL HIGH

## 2011-02-27 LAB — CBC
HCT: 27.6 — ABNORMAL LOW
HCT: 29 — ABNORMAL LOW
HCT: 29.6 — ABNORMAL LOW
HCT: 30.8 — ABNORMAL LOW
HCT: 31.5 — ABNORMAL LOW
HCT: 32.2 — ABNORMAL LOW
HCT: 36
Hemoglobin: 10.1 — ABNORMAL LOW
Hemoglobin: 10.3 — ABNORMAL LOW
Hemoglobin: 10.9 — ABNORMAL LOW
Hemoglobin: 11.1 — ABNORMAL LOW
Hemoglobin: 9.1 — ABNORMAL LOW
Hemoglobin: 9.8 — ABNORMAL LOW
MCHC: 33.3
MCHC: 33.4
MCV: 104.1 — ABNORMAL HIGH
MCV: 104.6 — ABNORMAL HIGH
MCV: 105.7 — ABNORMAL HIGH
MCV: 105.8 — ABNORMAL HIGH
Platelets: 557 — ABNORMAL HIGH
Platelets: 565 — ABNORMAL HIGH
Platelets: 625 — ABNORMAL HIGH
Platelets: 666 — ABNORMAL HIGH
Platelets: 694 — ABNORMAL HIGH
RBC: 2.9 — ABNORMAL LOW
RBC: 2.98 — ABNORMAL LOW
RBC: 3.19 — ABNORMAL LOW
RDW: 19.4 — ABNORMAL HIGH
RDW: 19.7 — ABNORMAL HIGH
RDW: 19.7 — ABNORMAL HIGH
RDW: 19.7 — ABNORMAL HIGH
RDW: 19.9 — ABNORMAL HIGH
WBC: 10.5
WBC: 11.2 — ABNORMAL HIGH
WBC: 12.3 — ABNORMAL HIGH
WBC: 12.7 — ABNORMAL HIGH
WBC: 12.9 — ABNORMAL HIGH

## 2011-02-27 LAB — POCT I-STAT 3, ART BLOOD GAS (G3+)
Acid-Base Excess: 1
Bicarbonate: 26.8 — ABNORMAL HIGH
pCO2 arterial: 48.5 — ABNORMAL HIGH
pH, Arterial: 7.351
pO2, Arterial: 54 — ABNORMAL LOW

## 2011-02-27 LAB — POCT I-STAT 3, VENOUS BLOOD GAS (G3P V)
Acid-Base Excess: 1
Acid-Base Excess: 2
Bicarbonate: 28.1 — ABNORMAL HIGH
O2 Saturation: 59
TCO2: 29
pCO2, Ven: 50.5 — ABNORMAL HIGH
pH, Ven: 7.338 — ABNORMAL HIGH

## 2011-02-27 LAB — HEPARIN LEVEL (UNFRACTIONATED)
Heparin Unfractionated: 0.23 — ABNORMAL LOW
Heparin Unfractionated: 0.32
Heparin Unfractionated: 0.42
Heparin Unfractionated: 0.53
Heparin Unfractionated: 0.63
Heparin Unfractionated: 0.7

## 2011-02-27 LAB — CK TOTAL AND CKMB (NOT AT ARMC)
CK, MB: 1.4
Total CK: 24

## 2011-02-27 LAB — B-NATRIURETIC PEPTIDE (CONVERTED LAB)
Pro B Natriuretic peptide (BNP): 191 — ABNORMAL HIGH
Pro B Natriuretic peptide (BNP): 213 — ABNORMAL HIGH

## 2011-02-27 LAB — CARDIAC PANEL(CRET KIN+CKTOT+MB+TROPI)
Relative Index: INVALID
Total CK: 25
Troponin I: 0.01
Troponin I: <0.01

## 2011-02-27 LAB — URINALYSIS, ROUTINE W REFLEX MICROSCOPIC
Nitrite: NEGATIVE
Specific Gravity, Urine: 1.023
Urobilinogen, UA: 1
pH: 5.5

## 2011-02-27 LAB — URINE MICROSCOPIC-ADD ON

## 2011-02-27 LAB — DIFFERENTIAL
Basophils Relative: 1
Eosinophils Absolute: 0.3
Monocytes Absolute: 1.3 — ABNORMAL HIGH
Monocytes Relative: 11

## 2011-02-27 LAB — POCT CARDIAC MARKERS: Troponin i, poc: <0.05

## 2011-02-27 LAB — URINE CULTURE

## 2011-02-27 LAB — POTASSIUM: Potassium: 4.3

## 2011-03-15 LAB — PROTIME-INR
INR: 1.9 — ABNORMAL HIGH
INR: 1.9 — ABNORMAL HIGH
INR: 2.5 — ABNORMAL HIGH
Prothrombin Time: 22.6 — ABNORMAL HIGH
Prothrombin Time: 23.1 — ABNORMAL HIGH
Prothrombin Time: 28.8 — ABNORMAL HIGH

## 2011-03-15 LAB — CBC
MCHC: 32.5
MCV: 114 — ABNORMAL HIGH
Platelets: 563 — ABNORMAL HIGH
RBC: 2.54 — ABNORMAL LOW
RDW: 13.6

## 2011-03-15 LAB — COMPREHENSIVE METABOLIC PANEL
ALT: 15
AST: 23
CO2: 30
Calcium: 8.9
Creatinine, Ser: 0.67
GFR calc Af Amer: >60
GFR calc non Af Amer: >60
Glucose, Bld: 98
Sodium: 136
Total Protein: 6.7

## 2011-03-15 LAB — POCT CARDIAC MARKERS
CKMB, poc: <1 — ABNORMAL LOW
Operator id: 196461
Troponin i, poc: <0.05

## 2011-05-26 ENCOUNTER — Telehealth: Payer: Self-pay | Admitting: Oncology

## 2011-05-26 NOTE — Telephone Encounter (Signed)
S/w pt re appts for 1/10 and 5/9

## 2011-06-07 ENCOUNTER — Other Ambulatory Visit: Payer: BC Managed Care – PPO | Admitting: Lab

## 2011-06-14 ENCOUNTER — Other Ambulatory Visit (HOSPITAL_BASED_OUTPATIENT_CLINIC_OR_DEPARTMENT_OTHER): Payer: BC Managed Care – PPO | Admitting: Lab

## 2011-06-14 DIAGNOSIS — D559 Anemia due to enzyme disorder, unspecified: Secondary | ICD-10-CM

## 2011-06-14 LAB — CBC WITH DIFFERENTIAL/PLATELET
BASO%: 0.4 % (ref 0.0–2.0)
EOS%: 3.7 % (ref 0.0–7.0)
HCT: 26.5 % — ABNORMAL LOW (ref 34.8–46.6)
LYMPH%: 22.3 % (ref 14.0–49.7)
MCH: 36.2 pg — ABNORMAL HIGH (ref 25.1–34.0)
MCHC: 31.8 g/dL (ref 31.5–36.0)
MCV: 113.8 fL — ABNORMAL HIGH (ref 79.5–101.0)
MONO%: 16 % — ABNORMAL HIGH (ref 0.0–14.0)
NEUT%: 57.6 % (ref 38.4–76.8)
Platelets: 618 10*3/uL — ABNORMAL HIGH (ref 145–400)
RBC: 2.33 10*6/uL — ABNORMAL LOW (ref 3.70–5.45)
WBC: 11.6 10*3/uL — ABNORMAL HIGH (ref 3.9–10.3)

## 2011-06-14 LAB — COMPREHENSIVE METABOLIC PANEL
ALT: 14 U/L (ref 0–35)
Alkaline Phosphatase: 92 U/L (ref 39–117)
CO2: 28 mEq/L (ref 19–32)
Creatinine, Ser: 0.68 mg/dL (ref 0.50–1.10)
Sodium: 138 mEq/L (ref 135–145)
Total Bilirubin: 1.2 mg/dL (ref 0.3–1.2)
Total Protein: 7.6 g/dL (ref 6.0–8.3)

## 2011-06-14 LAB — FERRITIN: Ferritin: 70 ng/mL (ref 10–291)

## 2011-06-15 ENCOUNTER — Telehealth: Payer: Self-pay | Admitting: *Deleted

## 2011-06-15 NOTE — Telephone Encounter (Signed)
Message copied by Wandalee Ferdinand on Fri Jun 15, 2011  5:43 PM ------      Message from: Thornton Papas B      Created: Fri Jun 15, 2011  9:18 AM       Please call patient, [name] is higher, but ok.  We can check in May.  Creatinine is normal

## 2011-06-15 NOTE — Telephone Encounter (Signed)
Patient notified of lab results. Hardcopy in mail to home.

## 2011-08-23 ENCOUNTER — Other Ambulatory Visit: Payer: Self-pay | Admitting: Oncology

## 2011-08-23 DIAGNOSIS — D594 Other nonautoimmune hemolytic anemias: Secondary | ICD-10-CM

## 2011-10-04 ENCOUNTER — Telehealth: Payer: Self-pay | Admitting: Oncology

## 2011-10-04 ENCOUNTER — Ambulatory Visit (HOSPITAL_BASED_OUTPATIENT_CLINIC_OR_DEPARTMENT_OTHER): Payer: BC Managed Care – PPO | Admitting: Oncology

## 2011-10-04 ENCOUNTER — Other Ambulatory Visit (HOSPITAL_BASED_OUTPATIENT_CLINIC_OR_DEPARTMENT_OTHER): Payer: BC Managed Care – PPO

## 2011-10-04 DIAGNOSIS — D559 Anemia due to enzyme disorder, unspecified: Secondary | ICD-10-CM

## 2011-10-04 DIAGNOSIS — D594 Other nonautoimmune hemolytic anemias: Secondary | ICD-10-CM

## 2011-10-04 DIAGNOSIS — D649 Anemia, unspecified: Secondary | ICD-10-CM

## 2011-10-04 LAB — CBC WITH DIFFERENTIAL/PLATELET
Basophils Absolute: 0.1 10*3/uL (ref 0.0–0.1)
EOS%: 5.2 % (ref 0.0–7.0)
HCT: 28.2 % — ABNORMAL LOW (ref 34.8–46.6)
HGB: 9 g/dL — ABNORMAL LOW (ref 11.6–15.9)
LYMPH%: 23.3 % (ref 14.0–49.7)
MCH: 34.9 pg — ABNORMAL HIGH (ref 25.1–34.0)
MCHC: 32 g/dL (ref 31.5–36.0)
MONO#: 1.5 10*3/uL — ABNORMAL HIGH (ref 0.1–0.9)
NEUT%: 50 % (ref 38.4–76.8)
Platelets: 517 10*3/uL — ABNORMAL HIGH (ref 145–400)
lymph#: 1.7 10*3/uL (ref 0.9–3.3)

## 2011-10-04 LAB — COMPREHENSIVE METABOLIC PANEL
Albumin: 4.3 g/dL (ref 3.5–5.2)
BUN: 24 mg/dL — ABNORMAL HIGH (ref 6–23)
CO2: 27 mEq/L (ref 19–32)
Calcium: 9.5 mg/dL (ref 8.4–10.5)
Glucose, Bld: 127 mg/dL — ABNORMAL HIGH (ref 70–99)
Potassium: 3.5 mEq/L (ref 3.5–5.3)
Sodium: 134 mEq/L — ABNORMAL LOW (ref 135–145)
Total Protein: 7.1 g/dL (ref 6.0–8.3)

## 2011-10-04 NOTE — Telephone Encounter (Signed)
gve the pt her aug,nov 2013 appt calendar. The pt is aware she will be contacted with the genetics testing appt

## 2011-10-04 NOTE — Progress Notes (Signed)
   [facility]    OFFICE PROGRESS NOTE   INTERVAL HISTORY:   She returns as scheduled. She continues to have intermittent dyspnea. She is followed by Dr. Shelle Iron. Stable bone pain. She bruises easily.  Objective:  Vital signs in last 24 hours:   Resp: Lungs clear with good air movement bilaterally, no respiratory distress Cardio: Regular rate and rhythm GI: No hepatomegaly, nontender Vascular: No leg edema  Lab Results:  Lab Results  Component Value Date   WBC 7.5 10/04/2011   HGB 9.0* 10/04/2011   HCT 28.2* 10/04/2011   MCV 109.0* 10/04/2011   PLT 517* 10/04/2011   ANC 6.8 Ferritin pending Ferritin 70 on 06/14/2011   Medications: I have reviewed the patient's current medications.  Assessment/Plan: 1. 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 2. Secondary iron overload:  Maintained on Exjade.  Exjade was resumed 10/14/2009.  We will follow up on a ferritin level from today. 3. Hospitalization 05/06 through 10/06/2009 with acute renal failure, likely secondary to dehydration.  She was also noted to have an elevated ammonia level.  The ammonia level normalized during the hospitalization.  The creatinine was repeated today. 4. Hospitalization 03/18 through 08/16/2009 with pneumonia. 5. History of atrial fibrillation/flutter:  Status post a Maze procedure. 6. History of mitral regurgitation:  Status post mitral valve repair by Dr. Cornelius Moras in February 2010. 7. History of a pericardial effusion. 8. Chronic bone pain:  Controlled with tramadol. 9. 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 10. Exertional dyspnea:  Likely related to multiple factors including cardiac disease, diaphragmatic paralysis, anemia, and asthma. She is followed by Dr. Shelle Iron.   Disposition:  She appears stable from a hematologic standpoint. We will followup on the ferritin level from today. She continues Exjade.  Ms. Ratti will return  for a lab visit in 3 months. She is scheduled for a 6 month office visit.   Thornton Papas, MD  10/04/2011  2:18 PM

## 2011-10-04 NOTE — Telephone Encounter (Signed)
gve the genetic referral to [facility]

## 2011-10-05 ENCOUNTER — Telehealth: Payer: Self-pay | Admitting: *Deleted

## 2011-10-05 NOTE — Telephone Encounter (Signed)
Message copied by Wandalee Ferdinand on Fri Oct 05, 2011 11:37 AM ------      Message from: [name]      Created: Thu Oct 04, 2011  8:55 PM       Please call patient, ferritin is normal

## 2011-10-05 NOTE — Telephone Encounter (Signed)
Notified via home VM that ferritin is normal

## 2011-10-30 ENCOUNTER — Telehealth: Payer: Self-pay | Admitting: Oncology

## 2011-10-30 NOTE — Telephone Encounter (Signed)
lmonvm for pt re calling me for genetics appt.

## 2011-11-07 ENCOUNTER — Telehealth: Payer: Self-pay | Admitting: Oncology

## 2011-11-07 NOTE — Telephone Encounter (Signed)
Pt returned call re genetics appt and was given appt for 7/1 @ 2 pm. BS referring.

## 2011-11-26 ENCOUNTER — Other Ambulatory Visit: Payer: BC Managed Care – PPO | Admitting: Lab

## 2011-11-26 ENCOUNTER — Ambulatory Visit (HOSPITAL_BASED_OUTPATIENT_CLINIC_OR_DEPARTMENT_OTHER): Payer: BC Managed Care – PPO | Admitting: Genetic Counselor

## 2011-11-26 DIAGNOSIS — IMO0002 Reserved for concepts with insufficient information to code with codable children: Secondary | ICD-10-CM

## 2011-11-26 DIAGNOSIS — Z8041 Family history of malignant neoplasm of ovary: Secondary | ICD-10-CM

## 2011-11-26 DIAGNOSIS — Z803 Family history of malignant neoplasm of breast: Secondary | ICD-10-CM

## 2011-11-27 ENCOUNTER — Encounter: Payer: Self-pay | Admitting: Genetic Counselor

## 2011-11-27 NOTE — Progress Notes (Signed)
Dr.  Truett Perna requested a consultation for genetic counseling and risk assessment for [name], a 61 y.o. female, for discussion of her family history of breast and ovarian cancer. She presents to clinic today to discuss the possibility of a genetic predisposition to cancer, and to further clarify her risks, as well as her family members' risks for cancer.   HISTORY OF PRESENT ILLNESS: [name] is a 61 y.o. female with no personal history of cancer.  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  Per patient, one of her sisters is a carrier of this, she is affected and two siblings are not affected or carriers.  Past Medical History  Diagnosis Date  . Pyruvate kinase deficiency hemolytic anemia   . Pyruvate kinase deficiency hemolytic anemia     No past surgical history on file.  History  Substance Use Topics  . Smoking status: Not on file  . Smokeless tobacco: Not on file  . Alcohol Use:     REPRODUCTIVE HISTORY AND PERSONAL RISK ASSESSMENT FACTORS: Menarche was at age 33.   Menopause was at age 69. Uterus Intact: Yes Ovaries Intact: Yes [id number] , first live birth at age 66  She has not previously undergone treatment for infertility.   OCP use for about 3 years   She has not used HRT in the past.    FAMILY HISTORY:  We obtained a detailed, 4-generation family history.  Significant diagnoses are listed below: Family History  Problem Relation Age of Onset  . Breast cancer Mother 55    ER+  . Melanoma Father 84  . Adrenal disorder Sister     pheochromacytoma - rt. adrenal gland  . COPD Maternal Aunt   . Stomach cancer Paternal Uncle     onset in 49s  . Lung cancer Maternal Grandmother     died late 18s, heavy smoker  . Heart disease Maternal Grandfather     hardening of the arteries, poor circulation  . Ovarian cancer Sister 54    stage IV, now 83  the patient has never been diagnosed  with cancer, but has autosomal recessive pyruvate kinase anemia, uterine fibroids, and atypical hyperplasia.  She has two sisters and one brother.  Her youngest sister was diagnosed with ovarian cancer at age 51, and is currently stage IV.  The patient's mother was diagnosed with Breast cancer at age 68 and died at 95.  Her maternal grandmother died of what she thinks was lung cancer in her late 27s.  She was a heavy smoker.  The patient's father was diagnosed with melanoma at age 68.  He was in end stage dementia when a mass in his groin was found at age 44.  He died soon after this was found.  The patient's paternal uncle was diagnosed with stomach cancer and died in his 67s.  There is no other reported cancer history on either side of the family.  Patient's maternal ancestors are of [place] descent, and paternal ancestors are of [place] descent. There is no reported Ashkenazi Jewish ancestry. There is no known consanguinity.  GENETIC COUNSELING RISK ASSESSMENT, DISCUSSION, AND SUGGESTED FOLLOW UP: We reviewed the natural history and genetic etiology of sporadic, familial and hereditary cancer syndromes.   About 5-10% of breast cancer is hereditary.  Of this, about 85% is the result of a BRCA1 or BRCA2 mutation.  We reviewed the red flags of hereditary cancer syndromes and the  dominant inheritance patterns.  If the BRCA testing is negative, we discussed that we could be testing for the wrong gene.  We discussed gene panels, and that several cancer genes that are associated with different cancers can be tested at the same time.  Because of the different types of cancer that are in the patient's family, we will consider one of the panel tests if she is negative for BRCA mutations. Lastly, we discussed that testing her sister would be more informative for the family, as she is the one who has cancer and would more likely test positive for a mutation.  The patient agreed, and feels that her sister would be  open to testing, however, she is currently "fighting for her life", and the patient feels that since she has come in to see genetics and is willing to be tested, her sister may take this to heart and take time out to get tested.  The patient decided to take this information to her sister, and if she decides that she will not get tested, she will call me back and test herself.  The patient reports that her sister is seen at the [facility].  We discussed that there is a cancer panel test that is performed out of that center that is very comprehensive and would provide testing for up to 40 different genes that we currently are familiar with causing an increased risk for cancer.  The patient's family history is suggestive of the following possible diagnosis: hereditary breast cancer syndrome  We discussed that identification of a hereditary cancer syndrome may help her care providers tailor the patients medical management. If a mutation indicating hereditary cancer syndrome is detected in this case, the [employer] recommendations would include increased cancer surveillance and possible prophylactic surgery. If a mutation is detected, the patient will be referred back to the referring provider and to any additional appropriate care providers to discuss the relevant options.   If a mutation is not found in the patient, other family members who have been affected with cancer should consider being tested to be most informative for this family. Cancer surveillance options would be discussed for the patient according to the appropriate standard National Comprehensive Cancer Network and American Cancer Society guidelines, with consideration of their personal and family history risk factors. In this case, the patient will be referred back to their care providers for discussions of management.   In order to estimate her chance of having a BRCA1 or  BRCA2 mutation, we used statistical models (Penn II) and laboratory data that take into account her personal medical history, family history and ancestry.  Because each model is different, there can be a lot of variability in the risks they give.  Therefore, these numbers must be considered a rough range and not a precise risk of having a BRCA1 or BRCA2 mutation.  These models estimate that she has approximately a 1% chance of having a mutation.  After considering the risks, benefits, and limitations, the patient will talk with her sister and determine if she is willing to undergo testing before the patient continues with testing.  Per the patient's request, she will contact me by telephone to to let me know whether she will continue with testing or if her sister will undergo testing. A follow up genetic counseling visit will be scheduled if indicated.  The patient was seen for a total of 90 minutes, greater than 50% of which was  spent face-to-face counseling.  This plan is being carried out per Dr. Kalman Drape recommendations.  This note will also be sent to the referring provider via the electronic medical record. The patient will be supplied with a summary of this genetic counseling discussion as well as educational information on the discussed hereditary cancer syndromes following the conclusion of their visit.   Patient was discussed with Dr. Drue Second.  EDUCATIONAL INFORMATION SUPPLIED TO PATIENT AT ENCOUNTER:  Hereditary Breast and Ovarian Cancer syndrome brochure  _______________________________________________________________________ For Office Staff:  Number of people involved in session: 2 Was an Intern/ student involved with case: not applicable

## 2012-01-04 ENCOUNTER — Other Ambulatory Visit: Payer: BC Managed Care – PPO

## 2012-04-08 ENCOUNTER — Telehealth: Payer: Self-pay | Admitting: Oncology

## 2012-04-08 ENCOUNTER — Ambulatory Visit (HOSPITAL_BASED_OUTPATIENT_CLINIC_OR_DEPARTMENT_OTHER): Payer: BC Managed Care – PPO | Admitting: Oncology

## 2012-04-08 ENCOUNTER — Telehealth: Payer: Self-pay | Admitting: *Deleted

## 2012-04-08 ENCOUNTER — Other Ambulatory Visit (HOSPITAL_BASED_OUTPATIENT_CLINIC_OR_DEPARTMENT_OTHER): Payer: BC Managed Care – PPO | Admitting: Lab

## 2012-04-08 VITALS — BP 145/80 | HR 89 | Temp 97.9°F | Resp 20 | Ht 63.0 in | Wt 162.3 lb

## 2012-04-08 DIAGNOSIS — D559 Anemia due to enzyme disorder, unspecified: Secondary | ICD-10-CM

## 2012-04-08 DIAGNOSIS — Z23 Encounter for immunization: Secondary | ICD-10-CM

## 2012-04-08 DIAGNOSIS — G8929 Other chronic pain: Secondary | ICD-10-CM

## 2012-04-08 DIAGNOSIS — D594 Other nonautoimmune hemolytic anemias: Secondary | ICD-10-CM

## 2012-04-08 LAB — CBC WITH DIFFERENTIAL/PLATELET
Eosinophils Absolute: 0.3 10*3/uL (ref 0.0–0.5)
HGB: 9.5 g/dL — ABNORMAL LOW (ref 11.6–15.9)
MONO#: 1.6 10*3/uL — ABNORMAL HIGH (ref 0.1–0.9)
MONO%: 18.4 % — ABNORMAL HIGH (ref 0.0–14.0)
NEUT#: 4.5 10*3/uL (ref 1.5–6.5)
RBC: 2.67 10*6/uL — ABNORMAL LOW (ref 3.70–5.45)
RDW: 14.7 % — ABNORMAL HIGH (ref 11.2–14.5)
WBC: 8.8 10*3/uL (ref 3.9–10.3)
lymph#: 2.3 10*3/uL (ref 0.9–3.3)
nRBC: 1 % — ABNORMAL HIGH (ref 0–0)

## 2012-04-08 LAB — COMPREHENSIVE METABOLIC PANEL (CC13)
ALT: 16 U/L (ref 0–55)
AST: 22 U/L (ref 5–34)
Calcium: 10 mg/dL (ref 8.4–10.4)
Chloride: 106 mEq/L (ref 98–107)
Creatinine: 0.8 mg/dL (ref 0.6–1.1)
Potassium: 3.8 mEq/L (ref 3.5–5.1)
Sodium: 139 mEq/L (ref 136–145)
Total Protein: 7.6 g/dL (ref 6.4–8.3)

## 2012-04-08 LAB — FERRITIN: Ferritin: 44 ng/mL (ref 10–291)

## 2012-04-08 MED ORDER — PNEUMOCOCCAL VAC POLYVALENT 25 MCG/0.5ML IJ INJ
0.5000 mL | INJECTION | Freq: Once | INTRAMUSCULAR | Status: AC
  Administered 2012-04-08: 0.5 mL via INTRAMUSCULAR
  Filled 2012-04-08: qty 0.5

## 2012-04-08 NOTE — Progress Notes (Signed)
   [facility]    OFFICE PROGRESS NOTE   INTERVAL HISTORY:   She returns as scheduled. She continues Exjade. She relates intermittent dyspnea to asthma. She recently had an episode of atrial fibrillation. No longer maintained on Coumadin. Stable bone pain. Objective:  Vital signs in last 24 hours:  Blood pressure 145/80, pulse 89, temperature 97.9 F (36.6 C), temperature source Oral, resp. rate 20, height 5\' 3"  (1.6 m), weight 162 lb 4.8 oz (73.619 kg).    Resp: Lungs clear bilaterally, good air movement over the right chest Cardio: Regular rate and rhythm GI: No hepatomegaly Vascular: No leg edema   Lab Results:  Lab Results  Component Value Date   WBC 8.8 04/08/2012   HGB 9.5* 04/08/2012   HCT 30.6* 04/08/2012   MCV 114.6* 04/08/2012   PLT 562* 04/08/2012   ANC 6.7  Ferritin on 10/04/2011-21 Medications: I have reviewed the patient's current medications.  Assessment/Plan: 1. 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 2. Secondary iron overload: Maintained on Exjade. Exjade was resumed 10/14/2009. We will follow up on a ferritin level from today. 3. Hospitalization 05/06 through 10/06/2009 with acute renal failure, likely secondary to dehydration. She was also noted to have an elevated ammonia level. The ammonia level normalized during the hospitalization.  4. Hospitalization 03/18 through 08/16/2009 with pneumonia. 5. History of atrial fibrillation/flutter: Status post a Maze procedure. 6. History of mitral regurgitation: Status post mitral valve repair by Dr. Cornelius Moras in February 2010. 7. History of a pericardial effusion. 8. Chronic bone pain: Controlled with tramadol. 9. 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 10. Exertional dyspnea: Likely related to multiple factors including cardiac disease, diaphragmatic paralysis, anemia, and asthma. She is followed by Dr. Shelle Iron. 11. Pneumococcal  vaccine-she had a local reaction to a pneumococcal vaccine in the remote past. She tolerated the pneumococcal vaccine in December of 2007 without apparent toxicity. She received another pneumococcal vaccine today.   Disposition:  Ms. Schexnider is stable from a hematologic standpoint. She will continue Exjade. She will return for a lab visit in 4 months. She is scheduled for an eight-month office visit.   Thornton Papas, MD  04/08/2012  2:00 PM

## 2012-04-08 NOTE — Telephone Encounter (Signed)
appts made and printed for pt  °

## 2012-04-08 NOTE — Telephone Encounter (Signed)
Called and spoke with pt; per Dr. Truett Perna instructed pt to let opthamologist be aware that she is on Exjade because it can have vision side effects i.e. cataracts.  Pt verbalized understanding and expressed appreciation for call.

## 2012-04-09 ENCOUNTER — Telehealth: Payer: Self-pay | Admitting: *Deleted

## 2012-04-09 NOTE — Telephone Encounter (Signed)
Message copied by Wandalee Ferdinand on Wed Apr 09, 2012 12:25 PM ------      Message from: Thornton Papas B      Created: Tue Apr 08, 2012  9:00 PM       Please call patient, ferritin is ok, f/u as scheduled

## 2012-04-09 NOTE — Telephone Encounter (Signed)
Left VM on her home # with ferritin result in normal range and to follow up as scheduled.

## 2012-05-13 ENCOUNTER — Ambulatory Visit: Payer: BC Managed Care – PPO | Admitting: Cardiovascular Disease

## 2012-06-16 ENCOUNTER — Encounter (HOSPITAL_COMMUNITY): Payer: Self-pay | Admitting: *Deleted

## 2012-06-16 ENCOUNTER — Encounter (HOSPITAL_COMMUNITY): Payer: Self-pay

## 2012-06-16 ENCOUNTER — Ambulatory Visit (HOSPITAL_COMMUNITY)
Admission: RE | Admit: 2012-06-16 | Discharge: 2012-06-16 | Disposition: A | Discharge status: Home / Self Care | Payer: Medicare Other | Source: Ambulatory Visit | Attending: Internal Medicine | Admitting: Internal Medicine

## 2012-06-16 VITALS — BP 130/82 | HR 75 | Wt 165.5 lb

## 2012-06-16 DIAGNOSIS — R0989 Other specified symptoms and signs involving the circulatory and respiratory systems: Secondary | ICD-10-CM | POA: Insufficient documentation

## 2012-06-16 DIAGNOSIS — I4891 Unspecified atrial fibrillation: Secondary | ICD-10-CM

## 2012-06-16 DIAGNOSIS — I2789 Other specified pulmonary heart diseases: Secondary | ICD-10-CM

## 2012-06-16 DIAGNOSIS — I272 Pulmonary hypertension, unspecified: Secondary | ICD-10-CM | POA: Insufficient documentation

## 2012-06-16 DIAGNOSIS — R0609 Other forms of dyspnea: Secondary | ICD-10-CM | POA: Insufficient documentation

## 2012-06-16 DIAGNOSIS — R0602 Shortness of breath: Secondary | ICD-10-CM

## 2012-06-16 HISTORY — DX: Unspecified atrial fibrillation: I48.91

## 2012-06-16 HISTORY — DX: Unspecified asthma, uncomplicated: J45.909

## 2012-06-16 LAB — BASIC METABOLIC PANEL
Chloride: 100 mEq/L (ref 96–112)
GFR calc Af Amer: >90 mL/min (ref >90)
Potassium: 3.6 mEq/L (ref 3.5–5.1)

## 2012-06-16 LAB — CBC
HCT: 31.9 % — ABNORMAL LOW (ref 36.0–46.0)
Hemoglobin: 10.1 g/dL — ABNORMAL LOW (ref 12.0–15.0)
WBC: 10.9 10*3/uL — ABNORMAL HIGH (ref 4.0–10.5)

## 2012-06-16 LAB — PROTIME-INR: INR: 1.02 (ref 0.00–1.49)

## 2012-06-16 NOTE — Assessment & Plan Note (Signed)
Long talk with patient and her family about her echo results and causes of pHTN including 1) left-sided heart disease 2) intrinsic lung disease (asthma, OSA, chronic hypoxia) and 3) high flow state (anemia). I suspect she has components of all 3. I also emphasized that it is very common for people with longstanding MV disease to develop a pulmonary vasculopathy even after valve has been fixed. We also discussed the role (and risks) of RHC to help sort out the relative contributions of the various factors in her PH and also help [place] to assess if we can further diurese her. At this point we will do the following:  1) Schedule RHC  2) Check PFTs with [facility] 3) Start home O2 4) Refer to pulmonary rehab

## 2012-06-16 NOTE — Patient Instructions (Addendum)
You have been referred to Pulmonary  You have been referred to Pulmonary Rehab, they will contact you to schedule an appointment  You have been scheduled for a Heart Cath (see additional sheet for instructions)

## 2012-06-16 NOTE — Progress Notes (Addendum)
Patient ID: [name], female   DOB: 11/05/1950, 61 y.o.   MRN: [id number]   Weight Range   Baseline proBNP     HPI:  [name] is a 61 y/o woman with h/o HTN, pyruvate kinase deficiency with hemolytic anemia, obesity, asthma, OSA (on CPAP), mitral valve prolapse s/p mini MV repair/Maze (2011 with Dr. Owen) and PAF s/p RFA followed by surgical Maze 2011. She has been referred by Dr. Turner for further evaluation of pHTN found on echo. Pre-op cath with normal coronaries. 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   Echo 12/13 EF 60-65% mild LVH. Grade 2 DD. MVR stable (no MS noted). Moderate LAE. RV mildly dilated. Mod-severe TR with estimated RVSP 80-90 range. (in 2011 was 40-45mm HG)  Recently had LifeWatch monitor for palpitations - frequent PACs, occasional brief (3-4 beats) PSVT. Metoprolol increased.   From functional standpoint she says she is having a hard time. Main complaint is bone pain which she relates to very active bone marrow secondary high turnover of RBCs from PK deficiency. Notes significant DOE. Can walk a fair distance at a slow pace. If she walks any faster or tries to go up any incline she can't do it. Doesn't exercise on a regular basis. Joined [facility] for water aerobics but didn't go. Occasional LE edema but not marked. Weight very stable over past 2 years. Chronic 2-pillow orthopnea. Takes lasix 40 mg bid. Weighs every day and if weight up will take extra lasix - will do that about 2x/month. No syncope or presyncope recently. Baseline hgb 7-9. Doesn't get transfused unless 6 or below.     ROS: All systems negative except as listed in HPI, PMH and Problem List.  Past Medical History  Diagnosis Date  . Pyruvate kinase deficiency hemolytic anemia   . Pyruvate kinase deficiency hemolytic anemia     Current Outpatient Prescriptions  Medication Sig Dispense Refill  . budesonide-formoterol (SYMBICORT) 160-4.5 MCG/ACT inhaler Inhale 2 puffs  into the lungs 2 (two) times daily.      . Calcium Carbonate-Vitamin D (CALCIUM + D PO) Take 1 tablet by mouth daily.      . clonazePAM (KLONOPIN) 1 MG tablet Take 1 mg by mouth at bedtime as needed.       . deferasirox (EXJADE) 500 MG disintegrating tablet Take 500 mg by mouth 3 (three) times daily before meals.      . EPINEPHrine (EPI-PEN) 0.3 mg/0.3 mL DEVI Inject 0.3 mg into the muscle once as needed.      . Eszopiclone (ESZOPICLONE) 3 MG TABS Take 3 mg by mouth at bedtime as needed. Take immediately before bedtime      . fexofenadine (ALLEGRA) 180 MG tablet Take 180 mg by mouth daily.      . folic acid (FOLVITE) 1 MG tablet TAKE 1 TABLET DAILY  90 tablet  4  . furosemide (LASIX) 20 MG tablet Take 40 mg by mouth 2 (two) times daily.       . metoprolol (LOPRESSOR) 50 MG tablet Take 75 mg by mouth 2 (two) times daily.       . mometasone (NASONEX) 50 MCG/ACT nasal spray Place 2 sprays into the nose daily.      . pantoprazole (PROTONIX) 40 MG tablet Take 40 mg by mouth daily.      . potassium chloride SA (K-DUR,KLOR-CON) 20 MEQ tablet Take 40 mEq by mouth daily. And 20 mg every night      .   Probiotic Product (ALIGN) 4 MG CAPS Take 1 tablet by mouth daily.      . raloxifene (EVISTA) 60 MG tablet Take 60 mg by mouth daily.      . traMADol (ULTRAM) 50 MG tablet Take 50 mg by mouth every 6 (six) hours as needed.       History  Substance Use Topics  . Smoking status: Not on file  . Smokeless tobacco: Not on file  . Alcohol Use:    Family History  Problem Relation Age of Onset  . Breast cancer Mother 74    ER+  . Melanoma Father 82  . Adrenal disorder Sister     pheochromacytoma - rt. adrenal gland  . COPD Maternal Aunt   . Stomach cancer Paternal Uncle     onset in 70s  . Lung cancer Maternal Grandmother     died late 40s, heavy smoker  . Heart disease Maternal Grandfather     hardening of the arteries, poor circulation  . Ovarian cancer Sister 49    stage IV, now 50       PHYSICAL EXAM: Filed Vitals:   06/16/12 1040  BP: 130/82  Pulse: 75  Weight: 165 lb 8 oz (75.07 kg)  SpO2: 93%   Sats 91% at rest on RA  Down to 82% with hall walk on RA  Back up to 92-93% with 2L O2 while walking   General: No resp difficulty HEENT: normal Neck: supple. JVP 8-9 Carotids 2+ bilaterally; no bruits. No lymphadenopathy or thryomegaly appreciated. Cor: PMI normal. Irregular rate & rhythm. No rubs, gallops 2/6 TR worse with inspiration. Mildly increased P2 no RV lift  Lungs: clear Abdomen: soft, nontender, nondistended. No hepatosplenomegaly. No bruits or masses. Good bowel sounds. Extremities: no cyanosis, clubbing, rash, tr edema R > L  Neuro: alert & orientedx3, cranial nerves grossly intact. Moves all 4 extremities w/o difficulty. Affect pleasant.    ASSESSMENT & PLAN:  

## 2012-06-17 NOTE — Addendum Note (Signed)
Encounter addended by: Noralee Space, RN on: 06/17/2012  4:00 PM<BR>     Documentation filed: Orders

## 2012-06-17 NOTE — Addendum Note (Signed)
Encounter addended by: Dolores Patty, MD on: 06/17/2012  2:43 PM<BR>     Documentation filed: Charting, Inpatient Notes

## 2012-06-24 ENCOUNTER — Encounter (HOSPITAL_BASED_OUTPATIENT_CLINIC_OR_DEPARTMENT_OTHER)
Admission: RE | Disposition: A | Discharge status: Home / Self Care | Payer: Self-pay | Source: Ambulatory Visit | Attending: Internal Medicine

## 2012-06-24 ENCOUNTER — Inpatient Hospital Stay (HOSPITAL_BASED_OUTPATIENT_CLINIC_OR_DEPARTMENT_OTHER)
Admission: RE | Admit: 2012-06-24 | Discharge: 2012-06-24 | Disposition: A | Discharge status: Home / Self Care | Payer: BC Managed Care – PPO | Source: Ambulatory Visit | Attending: Internal Medicine | Admitting: Internal Medicine

## 2012-06-24 DIAGNOSIS — R0989 Other specified symptoms and signs involving the circulatory and respiratory systems: Secondary | ICD-10-CM | POA: Insufficient documentation

## 2012-06-24 DIAGNOSIS — E669 Obesity, unspecified: Secondary | ICD-10-CM | POA: Insufficient documentation

## 2012-06-24 DIAGNOSIS — R0609 Other forms of dyspnea: Secondary | ICD-10-CM | POA: Insufficient documentation

## 2012-06-24 DIAGNOSIS — J45909 Unspecified asthma, uncomplicated: Secondary | ICD-10-CM | POA: Insufficient documentation

## 2012-06-24 DIAGNOSIS — I279 Pulmonary heart disease, unspecified: Secondary | ICD-10-CM

## 2012-06-24 DIAGNOSIS — I2789 Other specified pulmonary heart diseases: Secondary | ICD-10-CM | POA: Insufficient documentation

## 2012-06-24 LAB — POCT I-STAT 3, ART BLOOD GAS (G3+)
Bicarbonate: 21.8 mEq/L (ref 20.0–24.0)
pH, Arterial: 7.264 — ABNORMAL LOW (ref 7.350–7.450)
pO2, Arterial: 71 mmHg — ABNORMAL LOW (ref 80.0–100.0)

## 2012-06-24 LAB — POCT I-STAT 3, VENOUS BLOOD GAS (G3P V)
Acid-base deficit: 1 mmol/L (ref 0.0–2.0)
Bicarbonate: 25.1 mEq/L — ABNORMAL HIGH (ref 20.0–24.0)
Bicarbonate: 26.3 mEq/L — ABNORMAL HIGH (ref 20.0–24.0)
O2 Saturation: 66 %
O2 Saturation: 67 %
TCO2: 28 mmol/L (ref 0–100)
pCO2, Ven: 48.9 mmHg (ref 45.0–50.0)
pO2, Ven: 38 mmHg (ref 30.0–45.0)

## 2012-06-24 SURGERY — JV RIGHT HEART CATHETERIZATION
Anesthesia: Moderate Sedation

## 2012-06-24 MED ORDER — SODIUM CHLORIDE 0.9 % IV SOLN: 250.0000 mL | INTRAVENOUS | Status: DC | PRN

## 2012-06-24 MED ORDER — ACETAMINOPHEN 325 MG PO TABS: 650.0000 mg | ORAL_TABLET | ORAL | Status: DC | PRN

## 2012-06-24 MED ORDER — SODIUM CHLORIDE 0.9 % IJ SOLN: 3.0000 mL | INTRAMUSCULAR | Status: DC | PRN

## 2012-06-24 MED ORDER — SODIUM CHLORIDE 0.9 % IJ SOLN: 3.0000 mL | Freq: Two times a day (BID) | INTRAMUSCULAR | Status: DC

## 2012-06-24 MED ORDER — ONDANSETRON HCL 4 MG/2ML IJ SOLN: 4.0000 mg | Freq: Four times a day (QID) | INTRAMUSCULAR | Status: DC | PRN

## 2012-06-24 NOTE — CV Procedure (Signed)
Cardiac Cath Procedure Note:  Indication:  Dyspnea. Pulmonary HTN on echo.   Procedures performed:  1) Right heart catheterization  Description of procedure:   The risks and indication of the procedure were explained. Consent was signed and placed on the chart. An appropriate timeout was taken prior to the procedure. The right groin was prepped and draped in the routine sterile fashion and anesthetized with 1% local lidocaine.   A 7 FR venous sheath was placed in the right femoral vein using a modified Seldinger technique. A standard Swan-Ganz catheter was used for the procedure.   Complications: None apparent.  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  Plan/Discussion:  Would continue aggressive volume management. Recently started on home O2. Refer to pulmonary rehab. Would not start selective pulmonary vasodilator therapy at this point. Repeat echo in 3 months.   Daniel Bensimhon 11:18 AM

## 2012-06-24 NOTE — OR Nursing (Signed)
Tegaderm dressing applied, site level 0, bedrest began at 1135

## 2012-06-24 NOTE — Interval H&P Note (Signed)
History and Physical Interval Note:  06/24/2012 10:53 AM  [name]  has presented today for surgery, with the diagnosis of dyspnea  The various methods of treatment have been discussed with the patient and family. After consideration of risks, benefits and other options for treatment, the patient has consented to  Procedure(s) (LRB) with comments: JV RIGHT HEART CATHETERIZATION (N/A) as a surgical intervention .  The patient's history has been reviewed, patient examined, no change in status, stable for surgery.  I have reviewed the patient's chart and labs.  Questions were answered to the patient's satisfaction.     Daniel Bensimhon

## 2012-06-24 NOTE — H&P (View-Only) (Signed)
Patient ID: [name], female   DOB: [birth year], 62 y.o.   MRN: [id number]   Weight Range   Baseline proBNP     HPI:  [name] is a 62 y/o woman with h/o HTN, pyruvate kinase deficiency with hemolytic anemia, obesity, asthma, OSA (on CPAP), mitral valve prolapse s/p mini MV repair/Maze (2011 with Dr. Cornelius Moras) and PAF s/p RFA followed by surgical Maze 2011. She has been referred by Dr. Mayford Knife for further evaluation of pHTN found on echo. Pre-op cath with normal coronaries. 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   Echo 12/13 EF 60-65% mild LVH. Grade 2 DD. MVR stable (no MS noted). Moderate LAE. RV mildly dilated. Mod-severe TR with estimated RVSP 80-90 range. (in 2011 was 40-82mm HG)  Recently had LifeWatch monitor for palpitations - frequent PACs, occasional brief (3-4 beats) PSVT. Metoprolol increased.   From functional standpoint she says she is having a hard time. Main complaint is bone pain which she relates to very active bone marrow secondary high turnover of RBCs from PK deficiency. Notes significant DOE. Can walk a fair distance at a slow pace. If she walks any faster or tries to go up any incline she can't do it. Doesn't exercise on a regular basis. Joined [facility] for water aerobics but didn't go. Occasional LE edema but not marked. Weight very stable over past 2 years. Chronic 2-pillow orthopnea. Takes lasix 40 mg bid. Weighs every day and if weight up will take extra lasix - will do that about 2x/month. No syncope or presyncope recently. Baseline hgb 7-9. Doesn't get transfused unless 6 or below.     ROS: All systems negative except as listed in HPI, PMH and Problem List.  Past Medical History  Diagnosis Date  . Pyruvate kinase deficiency hemolytic anemia   . Pyruvate kinase deficiency hemolytic anemia     Current Outpatient Prescriptions  Medication Sig Dispense Refill  . budesonide-formoterol (SYMBICORT) 160-4.5 MCG/ACT inhaler Inhale 2 puffs  into the lungs 2 (two) times daily.      . Calcium Carbonate-Vitamin D (CALCIUM + D PO) Take 1 tablet by mouth daily.      . clonazePAM (KLONOPIN) 1 MG tablet Take 1 mg by mouth at bedtime as needed.       . deferasirox (EXJADE) 500 MG disintegrating tablet Take 500 mg by mouth 3 (three) times daily before meals.      Marland Kitchen EPINEPHrine (EPI-PEN) 0.3 mg/0.3 mL DEVI Inject 0.3 mg into the muscle once as needed.      . Eszopiclone (ESZOPICLONE) 3 MG TABS Take 3 mg by mouth at bedtime as needed. Take immediately before bedtime      . fexofenadine (ALLEGRA) 180 MG tablet Take 180 mg by mouth daily.      . folic acid (FOLVITE) 1 MG tablet TAKE 1 TABLET DAILY  90 tablet  4  . furosemide (LASIX) 20 MG tablet Take 40 mg by mouth 2 (two) times daily.       . metoprolol (LOPRESSOR) 50 MG tablet Take 75 mg by mouth 2 (two) times daily.       . mometasone (NASONEX) 50 MCG/ACT nasal spray Place 2 sprays into the nose daily.      . pantoprazole (PROTONIX) 40 MG tablet Take 40 mg by mouth daily.      . potassium chloride SA (K-DUR,KLOR-CON) 20 MEQ tablet Take 40 mEq by mouth daily. And 20 mg every night      .  Probiotic Product (ALIGN) 4 MG CAPS Take 1 tablet by mouth daily.      . raloxifene (EVISTA) 60 MG tablet Take 60 mg by mouth daily.      . traMADol (ULTRAM) 50 MG tablet Take 50 mg by mouth every 6 (six) hours as needed.       History  Substance Use Topics  . Smoking status: Not on file  . Smokeless tobacco: Not on file  . Alcohol Use:    Family History  Problem Relation Age of Onset  . Breast cancer Mother 22    ER+  . Melanoma Father 62  . Adrenal disorder Sister     pheochromacytoma - rt. adrenal gland  . COPD Maternal Aunt   . Stomach cancer Paternal Uncle     onset in 64s  . Lung cancer Maternal Grandmother     died late 4s, heavy smoker  . Heart disease Maternal Grandfather     hardening of the arteries, poor circulation  . Ovarian cancer Sister 37    stage IV, now 33       PHYSICAL EXAM: Filed Vitals:   06/16/12 1040  BP: 130/82  Pulse: 75  Weight: 165 lb 8 oz (75.07 kg)  SpO2: 93%   Sats 91% at rest on RA  Down to 82% with hall walk on RA  Back up to 92-93% with 2L O2 while walking   General: No resp difficulty HEENT: normal Neck: supple. JVP 8-9 Carotids 2+ bilaterally; no bruits. No lymphadenopathy or thryomegaly appreciated. Cor: PMI normal. Irregular rate & rhythm. No rubs, gallops 2/6 TR worse with inspiration. Mildly increased P2 no RV lift  Lungs: clear Abdomen: soft, nontender, nondistended. No hepatosplenomegaly. No bruits or masses. Good bowel sounds. Extremities: no cyanosis, clubbing, rash, tr edema R > L  Neuro: alert & orientedx3, cranial nerves grossly intact. Moves all 4 extremities w/o difficulty. Affect pleasant.    ASSESSMENT & PLAN:

## 2012-06-24 NOTE — OR Nursing (Signed)
Dr Bensimhon at bedside to discuss results and treatment plan with pt and family 

## 2012-06-24 NOTE — OR Nursing (Signed)
Discharge instructions reviewed and signed, pt stated understanding, ambulated in hall without difficulty, site level 0, transported to husband's car via wheelchair 

## 2012-06-26 ENCOUNTER — Encounter: Payer: Self-pay | Admitting: Pulmonary Disease

## 2012-06-26 ENCOUNTER — Ambulatory Visit (INDEPENDENT_AMBULATORY_CARE_PROVIDER_SITE_OTHER): Payer: BC Managed Care – PPO | Admitting: Pulmonary Disease

## 2012-06-26 VITALS — BP 102/62 | HR 75 | Temp 98.0°F | Ht 62.0 in | Wt 167.8 lb

## 2012-06-26 DIAGNOSIS — I272 Pulmonary hypertension, unspecified: Secondary | ICD-10-CM

## 2012-06-26 DIAGNOSIS — I2789 Other specified pulmonary heart diseases: Secondary | ICD-10-CM

## 2012-06-26 DIAGNOSIS — J986 Disorders of diaphragm: Secondary | ICD-10-CM

## 2012-06-26 DIAGNOSIS — J45909 Unspecified asthma, uncomplicated: Secondary | ICD-10-CM

## 2012-06-26 DIAGNOSIS — R0602 Shortness of breath: Secondary | ICD-10-CM

## 2012-06-26 NOTE — Assessment & Plan Note (Addendum)
PFT's 2011:  FEV1 1.23 (56%), ratio 72, TLC 3.10 (66%), DLCO 33% but corrects to normal with Av.  Fluor 2010:  Paralyzed right HD CT chest 2011:  No ISLD or other significant abnl.  RHC 05/2012:  Mean PA 36 (58/25), PCWP 19, Fick CO 6.6, calc PVR 2.6 wood units.   The patient has multifactorial dyspnea, but does not have any significant intrinsic lung disease on prior workup in 2011.  She has a history of asthma that is completely stable on her current inhaler regimen, with no airflow obstruction in 2011.  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  She has no interstitial lung disease on a CT chest in 2011.  The patient also is overweight and has some degree of deconditioning.  Finally, she has moderate pulmonary hypertension that is both pre-and post capillary.  The patient feels that her breathing has worsened since her last visit with me, and therefore will repeat her pulmonary function studies.  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

## 2012-06-26 NOTE — Patient Instructions (Addendum)
Will repeat your breathing studies, and call you with results. Stay on symbicort for your asthma. Will refer you to pulmonary rehab for your restrictive lung disease, from your paralyzed hemidiaphragm Work on weight loss and conditioning. Will check your oxygen level overnight on your cpap Will call you with results.

## 2012-06-26 NOTE — Assessment & Plan Note (Signed)
The patient is to continue on her current inhaler regimen which seems to be controlling her symptoms quite well.  She has not had any acute exacerbations since her last visit with me, nor does she over used her rescue inhaler.

## 2012-06-26 NOTE — Progress Notes (Signed)
  Subjective:    Patient ID: [name], female    DOB: 17-Aug-1950, 62 y.o.   MRN: [id number]  HPI The patient comes in today for pulmonary followup of dyspnea on exertion.  She was last seen in 2011, where she was found to have stable asthma that was very well controlled on her inhaler regimen.  She was also found to have a paralyzed right hemidiaphragm, and significant restriction on PFTs.  She also had a CT scan of her chest that showed no interstitial lung disease.  The patient has been lost to followup, but recently was evaluated by cardiology for pulmonary hypertension noted on echo.  She was found to have moderate pulmonary hypertension, with a wedge pressure of 19 and a calculated pulmonary vascular resistance of 2.6 Wood units.  She is currently on a diuretic regimen for her increased left-sided pressure, and was sent here for further pulmonary evaluation of her dyspnea.  She feels that her asthma is under good control, with no recent acute exacerbations and rare rescue inhaler use.  Of note, her weight is up 3 pounds from her last visit in 2011.   Review of Systems  Constitutional: Negative for fever and unexpected weight change.  HENT: Positive for congestion, rhinorrhea and postnasal drip. Negative for ear pain, nosebleeds, sore throat, sneezing, trouble swallowing, dental problem and sinus pressure.   Eyes: Negative for redness and itching.  Respiratory: Positive for shortness of breath. Negative for cough, chest tightness and wheezing.   Cardiovascular: Negative for palpitations and leg swelling.  Gastrointestinal: Negative for nausea and vomiting.  Genitourinary: Negative for dysuria.  Musculoskeletal: Negative for joint swelling.  Skin: Negative for rash.  Neurological: Negative for headaches.  Hematological: Does not bruise/bleed easily.  Psychiatric/Behavioral: Negative for dysphoric mood. The patient is not nervous/anxious.        Objective:   Physical  Exam Overweight female in no acute distress Nose without purulence or discharge noted No skin breakdown or pressure necrosis from the CPAP mask Neck without lymphadenopathy or thyromegaly Chest totally clear to auscultation, no wheezes or rhonchi Cardiac exam with regular rate and rhythm, 2/6 systolic murmur Lower extremities without edema, no cyanosis Alert and oriented, moves all 4 extremities.       Assessment & Plan:

## 2012-06-26 NOTE — Assessment & Plan Note (Signed)
I agree the patient is not a candidate for vasodilator therapy at this time.  I think she needs to be diuresed, make sure she is wearing CPAP compliantly, and to be complete I will check an overnight oximetry with her on CPAP to ensure that her oxygen levels are adequate.

## 2012-06-26 NOTE — Assessment & Plan Note (Signed)
Being managed by Dr. Mayford Knife.  The pt states she is compliant, and currently not wearing oxygen with cpap.

## 2012-07-13 ENCOUNTER — Other Ambulatory Visit: Payer: Self-pay | Admitting: Pulmonary Disease

## 2012-07-13 ENCOUNTER — Telehealth: Payer: Self-pay | Admitting: Pulmonary Disease

## 2012-07-13 DIAGNOSIS — I272 Pulmonary hypertension, unspecified: Secondary | ICD-10-CM

## 2012-07-13 DIAGNOSIS — R0602 Shortness of breath: Secondary | ICD-10-CM

## 2012-07-13 NOTE — Telephone Encounter (Signed)
Please let pt know that she needs to wear oxygen while wearing her cpap.  Would like to start her on 2 liters at night thru her machine.  Will send order to dme.

## 2012-07-14 ENCOUNTER — Ambulatory Visit (INDEPENDENT_AMBULATORY_CARE_PROVIDER_SITE_OTHER): Payer: BC Managed Care – PPO | Admitting: Pulmonary Disease

## 2012-07-14 ENCOUNTER — Telehealth: Payer: Self-pay | Admitting: Pulmonary Disease

## 2012-07-14 DIAGNOSIS — I2789 Other specified pulmonary heart diseases: Secondary | ICD-10-CM

## 2012-07-14 DIAGNOSIS — J45909 Unspecified asthma, uncomplicated: Secondary | ICD-10-CM

## 2012-07-14 DIAGNOSIS — I272 Pulmonary hypertension, unspecified: Secondary | ICD-10-CM

## 2012-07-14 LAB — PULMONARY FUNCTION TEST

## 2012-07-14 NOTE — Telephone Encounter (Signed)
Pt aware of results. [facility] has already spoken to patient. Will call if anything further needed.

## 2012-07-14 NOTE — Telephone Encounter (Signed)
Pt requesting a 3 month supply  for [facility]onex [facility]  Allergies  Allergen Reactions  . Iodine   . Iohexol      Code: HIVES, Desc: reaction since childhood, Onset Date: [id number]   . Shellfish Allergy      Dr Shelle Iron please advise  Thank you

## 2012-07-14 NOTE — Telephone Encounter (Signed)
I can't tell if I gave her this.  Don't see documentation. If I started this med, ok to fill. If I didn't, she needs to get from whoever did.

## 2012-07-14 NOTE — Progress Notes (Signed)
PFT done today. 

## 2012-07-15 NOTE — Telephone Encounter (Signed)
Nasonex not Rxd by [facility]. Patient aware. Patient states that she will call her PCP Dr Quincy Carnes plans to have him refill this.

## 2012-07-21 ENCOUNTER — Ambulatory Visit (HOSPITAL_COMMUNITY)
Admission: RE | Admit: 2012-07-21 | Discharge: 2012-07-21 | Disposition: A | Discharge status: Home / Self Care | Payer: BC Managed Care – PPO | Source: Ambulatory Visit | Attending: Internal Medicine | Admitting: Internal Medicine

## 2012-07-21 VITALS — BP 126/74 | HR 75 | Wt 166.8 lb

## 2012-07-21 DIAGNOSIS — I272 Pulmonary hypertension, unspecified: Secondary | ICD-10-CM

## 2012-07-21 DIAGNOSIS — I2789 Other specified pulmonary heart diseases: Secondary | ICD-10-CM

## 2012-07-21 LAB — BASIC METABOLIC PANEL
CO2: 30 mEq/L (ref 19–32)
Calcium: 9.6 mg/dL (ref 8.4–10.5)
Creatinine, Ser: 0.75 mg/dL (ref 0.50–1.10)
GFR calc Af Amer: >90 mL/min (ref >90)
GFR calc non Af Amer: 88 mL/min — ABNORMAL LOW (ref >90)
Sodium: 141 mEq/L (ref 135–145)

## 2012-07-21 NOTE — Assessment & Plan Note (Addendum)
Patient seen and examined with Ulyess Blossom, PA-C. We discussed all aspects of the encounter. I agree with the assessment and plan as stated above. My thoughts are below.  She has mild to moderate pulmonary HTN with a normal PVR which I think is due mainly to chronic hypoxia, OSA and possibly some diastolic HF. She is symtpomatically improving with close attention to these issues. We reviewed need to keep O2 sats > 90% at all times. Not candidate for selective pulmonary vasodilator therapy at this point. Would repeat echo in 3 months.

## 2012-07-21 NOTE — Progress Notes (Signed)
HPI:  [name] is a 62 y/o woman with h/o HTN, pyruvate kinase deficiency with hemolytic anemia, obesity, asthma, OSA (on CPAP), mitral valve prolapse s/p mini MV repair/Maze (2011 with Dr. Cornelius Moras) and PAF s/p RFA followed by surgical Maze 2011. She has been referred by Dr. Mayford Knife for further evaluation of pHTN found on echo. Pre-op cath with normal coronaries. 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   Echo 12/13 EF 60-65% mild LVH. Grade 2 DD. MVR stable (no MS noted). Moderate LAE. RV mildly dilated. Mod-severe TR with estimated RVSP 80-90 range. (in 2011 was 40-77mm HG)  Recently had LifeWatch monitor for palpitations - frequent PACs, occasional brief (3-4 beats) PSVT. Metoprolol increased.   Baseline hgb 7-9. Doesn't get transfused unless 6 or below.  RHC 06/24/12 RA = 10  RV = 60/7/12  PA = 58/25 (40)  PCW = 19 (no significant v-waves)  Fick cardiac output/index = 6.6/3.7  PVR = 3.1 Woods  FA sat = 91%  PA sat = 66%, 67%  She returns for follow up today.  She is feeling well.  She has recently had O2 added to night through CPAP.   Wears O2 when needed, checks O2 and if bleow 88% then puts O2 on.  [facility]   Watches sodium intake closely.  Has been referred to pulmonary rehab but hasn't heard yet.  Weight at home stable, will take an extra 1/2 lasix if weight up but this has only happened once in the last 6 months  ROS: All systems negative except as listed in HPI, PMH and Problem List.  Past Medical History  Diagnosis Date  . Pyruvate kinase deficiency hemolytic anemia   . Pyruvate kinase deficiency hemolytic anemia   . Atrial fibrillation   . Asthma     Current Outpatient Prescriptions  Medication Sig Dispense Refill  . budesonide-formoterol (SYMBICORT) 160-4.5 MCG/ACT inhaler Inhale 2 puffs into the lungs 2 (two) times daily.      . Calcium Carbonate-Vitamin D (CALCIUM + D PO) Take 1 tablet by mouth daily.      . clonazePAM  (KLONOPIN) 1 MG tablet Take 1 mg by mouth at bedtime as needed.       . deferasirox (EXJADE) 500 MG disintegrating tablet Take 500 mg by mouth 3 (three) times daily before meals.      Marland Kitchen EPINEPHrine (EPI-PEN) 0.3 mg/0.3 mL DEVI Inject 0.3 mg into the muscle once as needed.      . Eszopiclone (ESZOPICLONE) 3 MG TABS Take 3 mg by mouth at bedtime as needed. Take immediately before bedtime      . fexofenadine (ALLEGRA) 180 MG tablet Take 180 mg by mouth daily.      . folic acid (FOLVITE) 1 MG tablet TAKE 1 TABLET DAILY  90 tablet  4  . furosemide (LASIX) 20 MG tablet Take 40 mg by mouth 2 (two) times daily.       . metoprolol (LOPRESSOR) 50 MG tablet Take 75 mg by mouth 2 (two) times daily.       . mometasone (NASONEX) 50 MCG/ACT nasal spray Place 2 sprays into the nose daily.      . pantoprazole (PROTONIX) 40 MG tablet Take 40 mg by mouth daily.      . potassium chloride SA (K-DUR,KLOR-CON) 20 MEQ tablet Take 40 mEq by mouth daily. And 20 mg every night      . Probiotic Product (ALIGN) 4 MG CAPS  Take 1 tablet by mouth daily.      . raloxifene (EVISTA) 60 MG tablet Take 60 mg by mouth daily.      . sodium chloride (OCEAN) 0.65 % nasal spray Place 2 sprays into the nose as needed.      . traMADol (ULTRAM) 50 MG tablet Take 50 mg by mouth every 6 (six) hours as needed.      Marland Kitchen UNABLE TO FIND Med Name: AirBorne tablets   Chew 2 tabs twice a day      . UNABLE TO FIND Med Name: CPAP       No current facility-administered medications for this encounter.   History  Substance Use Topics  . Smoking status: Never Smoker   . Smokeless tobacco: Not on file  . Alcohol Use: No   Family History  Problem Relation Age of Onset  . Breast cancer Mother 36    ER+  . Melanoma Father 41  . Adrenal disorder Sister     pheochromacytoma - rt. adrenal gland  . COPD Maternal Aunt   . Stomach cancer Paternal Uncle     onset in 82s  . Lung cancer Maternal Grandmother     died late 26s, heavy smoker  . Heart  disease Maternal Grandfather     hardening of the arteries, poor circulation  . Ovarian cancer Sister 53    stage IV, now 64      PHYSICAL EXAM: Filed Vitals:   07/21/12 1020  BP: 126/74  Pulse: 75  Weight: 166 lb 12 oz (75.637 kg)  SpO2: 86%      General: No resp difficulty HEENT: normal Neck: supple. JVP 7-8 Carotids 2+ bilaterally; no bruits. No lymphadenopathy or thryomegaly appreciated. Cor: PMI normal. Irregular rate & rhythm. No rubs, gallops 2/6 TR worse with inspiration. Mildly increased P2 no RV lift  Lungs: clear Abdomen: soft, nontender, nondistended. No hepatosplenomegaly. No bruits or masses. Good bowel sounds. Extremities: no cyanosis, clubbing, rash, tr edema  Neuro: alert & orientedx3, cranial nerves grossly intact. Moves all 4 extremities w/o difficulty. Affect pleasant.    ASSESSMENT & PLAN:

## 2012-07-21 NOTE — Patient Instructions (Signed)
Labs today.  Follow up with echo in 3 months

## 2012-07-28 ENCOUNTER — Telehealth: Payer: Self-pay | Admitting: Pulmonary Disease

## 2012-07-28 ENCOUNTER — Encounter: Payer: Self-pay | Admitting: Pulmonary Disease

## 2012-07-28 NOTE — Telephone Encounter (Signed)
Please let pt know that her pfts are the same from the last testing.  Her asthma is well controlled, and all the other numbers are the same to slightly better. Would not do anything different from a pulmonary standpoint at this time.

## 2012-07-28 NOTE — Telephone Encounter (Signed)
Patient aware of results per [facility]. Nothing further needed.

## 2012-08-05 ENCOUNTER — Telehealth: Payer: Self-pay | Admitting: Pulmonary Disease

## 2012-08-05 ENCOUNTER — Other Ambulatory Visit (HOSPITAL_BASED_OUTPATIENT_CLINIC_OR_DEPARTMENT_OTHER): Payer: BC Managed Care – PPO | Admitting: Lab

## 2012-08-05 DIAGNOSIS — D594 Other nonautoimmune hemolytic anemias: Secondary | ICD-10-CM

## 2012-08-05 DIAGNOSIS — R0602 Shortness of breath: Secondary | ICD-10-CM

## 2012-08-05 DIAGNOSIS — G4733 Obstructive sleep apnea (adult) (pediatric): Secondary | ICD-10-CM

## 2012-08-05 LAB — CBC WITH DIFFERENTIAL/PLATELET
BASO%: 0.6 % (ref 0.0–2.0)
Eosinophils Absolute: 0.5 10*3/uL (ref 0.0–0.5)
MCHC: 31.4 g/dL — ABNORMAL LOW (ref 31.5–36.0)
MONO#: 2 10*3/uL — ABNORMAL HIGH (ref 0.1–0.9)
NEUT#: 5.3 10*3/uL (ref 1.5–6.5)
Platelets: 499 10*3/uL — ABNORMAL HIGH (ref 145–400)
RBC: 2.44 10*6/uL — ABNORMAL LOW (ref 3.70–5.45)
RDW: 14.7 % — ABNORMAL HIGH (ref 11.2–14.5)
WBC: 11 10*3/uL — ABNORMAL HIGH (ref 3.9–10.3)
lymph#: 3 10*3/uL (ref 0.9–3.3)
nRBC: 1 % — ABNORMAL HIGH (ref 0–0)

## 2012-08-05 LAB — COMPREHENSIVE METABOLIC PANEL (CC13)
ALT: 13 U/L (ref 0–55)
AST: 21 U/L (ref 5–34)
Albumin: 3.6 g/dL (ref 3.5–5.0)
Alkaline Phosphatase: 84 U/L (ref 40–150)
BUN: 17.1 mg/dL (ref 7.0–26.0)
Calcium: 9 mg/dL (ref 8.4–10.4)
Chloride: 105 mEq/L (ref 98–107)
Potassium: 3.4 mEq/L — ABNORMAL LOW (ref 3.5–5.1)
Sodium: 141 mEq/L (ref 136–145)

## 2012-08-05 LAB — FERRITIN: Ferritin: 53 ng/mL (ref 10–291)

## 2012-08-05 NOTE — Telephone Encounter (Signed)
I spoke with the pt and she is requesting an order be sent for a portable concentrator to use with her cpap. She states what [facility] gave her is not working with her cpap and this is what they recommended she use. She states it will also benefit her because they have a second home that they visit often and she will be able to take this with her when they travel. [facility] ordered for the pt to have oxygen through her cpap so I will send an order for the portable concentrator. Carron Curie, CMA

## 2012-08-08 ENCOUNTER — Telehealth: Payer: Self-pay | Admitting: *Deleted

## 2012-08-08 NOTE — Telephone Encounter (Signed)
Message copied by Caleb Popp on Fri Aug 08, 2012  4:01 PM ------      Message from: Thornton Papas B      Created: Wed Aug 06, 2012 10:07 PM       Please call patient, ferritin is ok, f/u as scheduled ------

## 2012-08-20 ENCOUNTER — Encounter (HOSPITAL_COMMUNITY): Payer: Self-pay

## 2012-08-20 ENCOUNTER — Encounter: Payer: Self-pay | Admitting: Pulmonary Disease

## 2012-08-20 ENCOUNTER — Encounter (HOSPITAL_COMMUNITY)
Admission: RE | Admit: 2012-08-20 | Discharge: 2012-08-20 | Disposition: A | Discharge status: Home / Self Care | Payer: BC Managed Care – PPO | Source: Ambulatory Visit | Attending: Pulmonary Disease | Admitting: Pulmonary Disease

## 2012-08-20 DIAGNOSIS — J45909 Unspecified asthma, uncomplicated: Secondary | ICD-10-CM | POA: Insufficient documentation

## 2012-08-20 DIAGNOSIS — I2789 Other specified pulmonary heart diseases: Secondary | ICD-10-CM | POA: Insufficient documentation

## 2012-08-20 DIAGNOSIS — Z5189 Encounter for other specified aftercare: Secondary | ICD-10-CM | POA: Insufficient documentation

## 2012-08-20 DIAGNOSIS — E669 Obesity, unspecified: Secondary | ICD-10-CM | POA: Insufficient documentation

## 2012-08-20 HISTORY — DX: Cardiac murmur, unspecified: R01.1

## 2012-08-20 HISTORY — DX: Syncope and collapse: R55

## 2012-08-20 NOTE — Progress Notes (Signed)
Pt participated in pulmonary rehab orientation today.  Pt oriented to program guidelines and participant expectations.  Pt instructed in purse-lip breathing and demonstrated understanding.   Pt alert and oriented, well kept, appears same as her stated age.   normal skin color.   Pt lungs clear.  Heart regular rate and rhythm.  Bowel sounds active x4.  Equal grip strength and lower extremity strength.  No pedal edema present.  VSS.  PHQ-9 score of zero.  Pt demonstrates appropriate grief pattern following death of 2 significant people in her life.  Pt has good coping skills and overall positive outlook on life.  Appropriate achievable goals self identified by patient.  Pt verbalized understanding.  Pt scheduled to begin program  08/21/12  .  6 minute walk test will be given after first day of exercise.

## 2012-08-21 ENCOUNTER — Encounter (HOSPITAL_COMMUNITY)
Admission: RE | Admit: 2012-08-21 | Discharge: 2012-08-21 | Disposition: A | Discharge status: Home / Self Care | Payer: BC Managed Care – PPO | Source: Ambulatory Visit | Attending: Pulmonary Disease | Admitting: Pulmonary Disease

## 2012-08-26 ENCOUNTER — Telehealth: Payer: Self-pay | Admitting: Pulmonary Disease

## 2012-08-26 ENCOUNTER — Encounter (HOSPITAL_COMMUNITY)
Admission: RE | Admit: 2012-08-26 | Discharge: 2012-08-26 | Disposition: A | Discharge status: Home / Self Care | Payer: BC Managed Care – PPO | Source: Ambulatory Visit | Attending: Pulmonary Disease | Admitting: Pulmonary Disease

## 2012-08-26 DIAGNOSIS — I2789 Other specified pulmonary heart diseases: Secondary | ICD-10-CM | POA: Insufficient documentation

## 2012-08-26 DIAGNOSIS — E669 Obesity, unspecified: Secondary | ICD-10-CM | POA: Insufficient documentation

## 2012-08-26 DIAGNOSIS — Z5189 Encounter for other specified aftercare: Secondary | ICD-10-CM | POA: Insufficient documentation

## 2012-08-26 DIAGNOSIS — J45909 Unspecified asthma, uncomplicated: Secondary | ICD-10-CM | POA: Insufficient documentation

## 2012-08-26 NOTE — Telephone Encounter (Signed)
Given to Dr Shelle Iron to review.

## 2012-08-26 NOTE — Telephone Encounter (Signed)
[employer] letter has been typed up and given to [facility] to review and sign.

## 2012-08-28 ENCOUNTER — Encounter (HOSPITAL_COMMUNITY)
Admission: RE | Admit: 2012-08-28 | Discharge: 2012-08-28 | Disposition: A | Discharge status: Home / Self Care | Payer: BC Managed Care – PPO | Source: Ambulatory Visit | Attending: Pulmonary Disease | Admitting: Pulmonary Disease

## 2012-08-29 NOTE — Telephone Encounter (Signed)
Pt returned call. She will pick these up. Nothing further needed per pt. [name]

## 2012-08-29 NOTE — Telephone Encounter (Signed)
Paperwork is ready for pick up. Will be placed up front in envelope. Left detailed message on machine that these are ready and are up front for pick up--asked pt on msg to call us to verify she received message and if she will be coming to get these today.

## 2012-09-02 ENCOUNTER — Encounter (HOSPITAL_COMMUNITY): Admission: RE | Admit: 2012-09-02 | Payer: BC Managed Care – PPO | Source: Ambulatory Visit

## 2012-09-02 ENCOUNTER — Telehealth (HOSPITAL_COMMUNITY): Payer: Self-pay | Admitting: *Deleted

## 2012-09-04 ENCOUNTER — Encounter (HOSPITAL_COMMUNITY): Payer: BC Managed Care – PPO

## 2012-09-09 ENCOUNTER — Encounter (HOSPITAL_COMMUNITY): Payer: BC Managed Care – PPO

## 2012-09-11 ENCOUNTER — Encounter (HOSPITAL_COMMUNITY)
Admission: RE | Admit: 2012-09-11 | Discharge: 2012-09-11 | Disposition: A | Discharge status: Home / Self Care | Payer: BC Managed Care – PPO | Source: Ambulatory Visit | Attending: Pulmonary Disease | Admitting: Pulmonary Disease

## 2012-09-16 ENCOUNTER — Encounter (HOSPITAL_COMMUNITY)
Admission: RE | Admit: 2012-09-16 | Discharge: 2012-09-16 | Disposition: A | Discharge status: Home / Self Care | Payer: BC Managed Care – PPO | Source: Ambulatory Visit | Attending: Pulmonary Disease | Admitting: Pulmonary Disease

## 2012-09-16 NOTE — Progress Notes (Signed)
Fabio Pierce 62 y.o. female Nutrition Note Spoke with pt. Pt is obese. Pt states she wants to lose wt, but "I'm focusing on smaller portions and making healthy lifestyle changes." Pt reports she has done "many diets." Per pt, she was the most successful with Weight Watcher's. Pt understands lifestyle changes are the only way to manage her weight. There are some ways the pt can make her eating habits healthier (e.g. Eat fish, meatless meals, and low-fat cheese more often and choose oils over butter). Pt's Rate Your Plate results reviewed with pt.  Pt expressed understanding.  Pt avoids salty food; does not use canned/ convenience food frequently.  Pt does not add salt to food.   Nutrition Diagnosis   Food-and nutrition-related knowledge deficit related to lack of exposure to information as related to diagnosis of pulmonary disease   Obesity related to excessive energy intake as evidenced by a BMI of 30.4 Nutrition Rx/Est. Daily Nutrition Needs for: ? wt loss 1200-1400 Kcal  60-75 gm protein   1500 mg or less sodium      Nutrition Intervention   Pt's individual nutrition plan and goals reviewed with pt.   Benefits of adopting healthy eating habits discussed when pt's Rate Your Plate reviewed.   Pt to attend the Nutrition and Lung Disease class   Continual client-centered nutrition education by RD, as part of interdisciplinary care. Goal(s) 1. Identify food quantities necessary to achieve wt loss of  -2# per week to a goal wt of 64.5-72.7 kg (142-160 lb) at graduation from pulmonary rehab. Monitor and Evaluate progress toward nutrition goal with team.   Mickle Plumb, M.Ed, RD, LDN, CDE 09/16/2012 2:44 PM

## 2012-09-18 ENCOUNTER — Encounter (HOSPITAL_COMMUNITY)
Admission: RE | Admit: 2012-09-18 | Discharge: 2012-09-18 | Disposition: A | Discharge status: Home / Self Care | Payer: BC Managed Care – PPO | Source: Ambulatory Visit | Attending: Pulmonary Disease | Admitting: Pulmonary Disease

## 2012-09-23 ENCOUNTER — Encounter (HOSPITAL_COMMUNITY)
Admission: RE | Admit: 2012-09-23 | Discharge: 2012-09-23 | Disposition: A | Discharge status: Home / Self Care | Payer: BC Managed Care – PPO | Source: Ambulatory Visit | Attending: Pulmonary Disease | Admitting: Pulmonary Disease

## 2012-09-25 ENCOUNTER — Encounter (HOSPITAL_COMMUNITY)
Admission: RE | Admit: 2012-09-25 | Discharge: 2012-09-25 | Disposition: A | Discharge status: Home / Self Care | Payer: BC Managed Care – PPO | Source: Ambulatory Visit | Attending: Pulmonary Disease | Admitting: Pulmonary Disease

## 2012-09-25 DIAGNOSIS — Z5189 Encounter for other specified aftercare: Secondary | ICD-10-CM | POA: Insufficient documentation

## 2012-09-25 DIAGNOSIS — J45909 Unspecified asthma, uncomplicated: Secondary | ICD-10-CM | POA: Insufficient documentation

## 2012-09-25 DIAGNOSIS — I2789 Other specified pulmonary heart diseases: Secondary | ICD-10-CM | POA: Insufficient documentation

## 2012-09-25 DIAGNOSIS — E669 Obesity, unspecified: Secondary | ICD-10-CM | POA: Insufficient documentation

## 2012-09-30 ENCOUNTER — Encounter (HOSPITAL_COMMUNITY)
Admission: RE | Admit: 2012-09-30 | Discharge: 2012-09-30 | Disposition: A | Discharge status: Home / Self Care | Payer: BC Managed Care – PPO | Source: Ambulatory Visit | Attending: Pulmonary Disease | Admitting: Pulmonary Disease

## 2012-10-02 ENCOUNTER — Encounter (HOSPITAL_COMMUNITY)
Admission: RE | Admit: 2012-10-02 | Discharge: 2012-10-02 | Disposition: A | Discharge status: Home / Self Care | Payer: BC Managed Care – PPO | Source: Ambulatory Visit | Attending: Pulmonary Disease | Admitting: Pulmonary Disease

## 2012-10-07 ENCOUNTER — Encounter (HOSPITAL_COMMUNITY)
Admission: RE | Admit: 2012-10-07 | Discharge: 2012-10-07 | Disposition: A | Discharge status: Home / Self Care | Payer: BC Managed Care – PPO | Source: Ambulatory Visit | Attending: Pulmonary Disease | Admitting: Pulmonary Disease

## 2012-10-09 ENCOUNTER — Encounter (HOSPITAL_COMMUNITY)
Admission: RE | Admit: 2012-10-09 | Discharge: 2012-10-09 | Disposition: A | Discharge status: Home / Self Care | Payer: BC Managed Care – PPO | Source: Ambulatory Visit | Attending: Pulmonary Disease | Admitting: Pulmonary Disease

## 2012-10-14 ENCOUNTER — Encounter (HOSPITAL_COMMUNITY)
Admission: RE | Admit: 2012-10-14 | Discharge: 2012-10-14 | Disposition: A | Discharge status: Home / Self Care | Payer: BC Managed Care – PPO | Source: Ambulatory Visit | Attending: Pulmonary Disease | Admitting: Pulmonary Disease

## 2012-10-16 ENCOUNTER — Encounter (HOSPITAL_COMMUNITY)
Admission: RE | Admit: 2012-10-16 | Discharge: 2012-10-16 | Disposition: A | Discharge status: Home / Self Care | Payer: BC Managed Care – PPO | Source: Ambulatory Visit | Attending: Pulmonary Disease | Admitting: Pulmonary Disease

## 2012-10-21 ENCOUNTER — Encounter (HOSPITAL_COMMUNITY)
Admission: RE | Admit: 2012-10-21 | Discharge: 2012-10-21 | Disposition: A | Discharge status: Home / Self Care | Payer: BC Managed Care – PPO | Source: Ambulatory Visit | Attending: Pulmonary Disease | Admitting: Pulmonary Disease

## 2012-10-22 ENCOUNTER — Ambulatory Visit (HOSPITAL_COMMUNITY)
Admission: RE | Admit: 2012-10-22 | Discharge: 2012-10-22 | Disposition: A | Discharge status: Home / Self Care | Payer: BC Managed Care – PPO | Source: Ambulatory Visit | Attending: Internal Medicine | Admitting: Internal Medicine

## 2012-10-22 ENCOUNTER — Ambulatory Visit (HOSPITAL_BASED_OUTPATIENT_CLINIC_OR_DEPARTMENT_OTHER)
Admission: RE | Admit: 2012-10-22 | Discharge: 2012-10-22 | Disposition: A | Discharge status: Home / Self Care | Payer: BC Managed Care – PPO | Source: Ambulatory Visit | Attending: Internal Medicine | Admitting: Internal Medicine

## 2012-10-22 VITALS — BP 140/78 | HR 84 | Wt 167.0 lb

## 2012-10-22 DIAGNOSIS — D559 Anemia due to enzyme disorder, unspecified: Secondary | ICD-10-CM | POA: Insufficient documentation

## 2012-10-22 DIAGNOSIS — I2789 Other specified pulmonary heart diseases: Secondary | ICD-10-CM

## 2012-10-22 DIAGNOSIS — I059 Rheumatic mitral valve disease, unspecified: Secondary | ICD-10-CM | POA: Insufficient documentation

## 2012-10-22 DIAGNOSIS — I10 Essential (primary) hypertension: Secondary | ICD-10-CM | POA: Insufficient documentation

## 2012-10-22 DIAGNOSIS — I079 Rheumatic tricuspid valve disease, unspecified: Secondary | ICD-10-CM | POA: Insufficient documentation

## 2012-10-22 DIAGNOSIS — I272 Pulmonary hypertension, unspecified: Secondary | ICD-10-CM

## 2012-10-22 DIAGNOSIS — I369 Nonrheumatic tricuspid valve disorder, unspecified: Secondary | ICD-10-CM

## 2012-10-22 DIAGNOSIS — G4733 Obstructive sleep apnea (adult) (pediatric): Secondary | ICD-10-CM | POA: Insufficient documentation

## 2012-10-22 DIAGNOSIS — D599 Acquired hemolytic anemia, unspecified: Secondary | ICD-10-CM | POA: Insufficient documentation

## 2012-10-22 DIAGNOSIS — E669 Obesity, unspecified: Secondary | ICD-10-CM | POA: Insufficient documentation

## 2012-10-22 MED ORDER — ASPIRIN 325 MG PO TABS: 325.0000 mg | ORAL_TABLET | Freq: Every day | ORAL | Status: DC

## 2012-10-22 NOTE — Progress Notes (Signed)
  Echocardiogram 2D Echocardiogram has been performed.  Cathie Beams 10/22/2012, 11:35 AM

## 2012-10-22 NOTE — Patient Instructions (Addendum)
Follow up in 3 months  Take 325 mg Aspirin daily  Do the following things EVERYDAY: 1) Weigh yourself in the morning before breakfast. Write it down and keep it in a log. 2) Take your medicines as prescribed 3) Eat low salt foods-Limit salt (sodium) to 2000 mg per day.  4) Stay as active as you can everyday 5) Limit all fluids for the day to less than 2 liters

## 2012-10-22 NOTE — Progress Notes (Signed)
Patient ID: Fabio Pierce, female   DOB: [birth year], 62 y.o.   MRN: [id number]  HPI:  Ms. Holness is a 62 y/o woman with h/o HTN, pyruvate kinase deficiency with hemolytic anemia, obesity, asthma, OSA (on CPAP), mitral valve prolapse s/p mini MV repair/Maze (2011 with Dr. Cornelius Moras), PAF s/p RFA followed by surgical Maze 2011 and diastolic dysfunction.  Pre-op cath with normal coronaries. 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   Echo 12/13 EF 60-65% mild LVH. Grade 2 DD. MVR stable (no MS noted). Moderate LAE. RV mildly dilated. Mod-severe TR with estimated RVSP 80-90 range. (in 2011 was 40-52mm HG)  Recently had LifeWatch monitor for palpitations - frequent PACs, occasional brief (3-4 beats) PSVT. Metoprolol increased.   Baseline hgb 7-9. Doesn't get transfused unless 6 or below.  RHC 06/24/12 RA = 10  RV = 60/7/12  PA = 58/25 (40)  PCW = 19 (no significant v-waves)  Fick cardiac output/index = 6.6/3.7  PVR = 3.1 Woods  FA sat = 91%  PA sat = 66%, 67%  ECHO 10/22/12 EF 55% RV mildly decreased + Septal bounce. Moderate TR. RVSP 65-70. +diastolic dysfunction (likely restrictive - but tissue Doppler not performed).   She returns for follow up of her pulmonary HTN today. Overall feels much better. Uses oxygen with exertion - if not drops down 83%. Continues to use CPAP nightly. Denies PND. Currently enrolled in pulmonary rehab. Remains tachycardic in rehab. Weighs every day. Weight at home 163 pounds. Compliant with medications. About once per week takes extra half tablet of lasix. Able to do all ADLs without difficulty. Bending over his hard for her as is being out in the heat. Last K+ = 3.4  ROS: All systems negative except as listed in HPI, PMH and Problem List.  Past Medical History  Diagnosis Date  . Pyruvate kinase deficiency hemolytic anemia   . Pyruvate kinase deficiency hemolytic anemia   . Atrial fibrillation   . Asthma   . Heart murmur   . Syncope  and collapse 2012    r/t anemia    Current Outpatient Prescriptions  Medication Sig Dispense Refill  . budesonide-formoterol (SYMBICORT) 160-4.5 MCG/ACT inhaler Inhale 2 puffs into the lungs 2 (two) times daily.      . calcium carbonate (OS-CAL) 600 MG TABS Take 600 mg by mouth 2 (two) times daily with a meal.      . cholecalciferol (VITAMIN D) 1000 UNITS tablet Take 1,000 Units by mouth daily.      . clonazePAM (KLONOPIN) 1 MG tablet Take 1 mg by mouth at bedtime as needed.       . deferasirox (EXJADE) 500 MG disintegrating tablet Take 500 mg by mouth daily with supper. Take 3 tablets once daily 30 minutes prior to largest meal      . EPINEPHrine (EPI-PEN) 0.3 mg/0.3 mL DEVI Inject 0.3 mg into the muscle once as needed.      . fexofenadine (ALLEGRA) 180 MG tablet Take 180 mg by mouth daily.      . folic acid (FOLVITE) 1 MG tablet TAKE 1 TABLET DAILY  90 tablet  4  . furosemide (LASIX) 20 MG tablet Take 40 mg by mouth 2 (two) times daily.       . metoprolol (LOPRESSOR) 50 MG tablet Take 75 mg by mouth 2 (two) times daily.       . mometasone (NASONEX) 50 MCG/ACT nasal spray Place 2 sprays into the nose daily.      Marland Kitchen  pantoprazole (PROTONIX) 40 MG tablet Take 40 mg by mouth daily.      . potassium chloride SA (K-DUR,KLOR-CON) 20 MEQ tablet Take 40 mEq by mouth daily. Take qAM adn qPM      . Probiotic Product (ALIGN) 4 MG CAPS Take 1 tablet by mouth daily.      . raloxifene (EVISTA) 60 MG tablet Take 60 mg by mouth daily.      . sodium chloride (OCEAN) 0.65 % nasal spray Place 2 sprays into the nose as needed.      . traMADol (ULTRAM) 50 MG tablet Take 50 mg by mouth every 6 (six) hours as needed.      Marland Kitchen UNABLE TO FIND Med Name: AirBorne tablets   Chew 2 tabs twice a day      . UNABLE TO FIND Med Name: CPAP      . Eszopiclone (ESZOPICLONE) 3 MG TABS Take 3 mg by mouth at bedtime as needed. Take immediately before bedtime       No current facility-administered medications for this  encounter.   History  Substance Use Topics  . Smoking status: Never Smoker   . Smokeless tobacco: Not on file  . Alcohol Use: No     Comment: socially glass red wine monthly   Family History  Problem Relation Age of Onset  . Breast cancer Mother 10    ER+  . Melanoma Father 61  . Adrenal disorder Sister     pheochromacytoma - rt. adrenal gland  . COPD Maternal Aunt   . Stomach cancer Paternal Uncle     onset in 42s  . Lung cancer Maternal Grandmother     died late 37s, heavy smoker  . Heart disease Maternal Grandfather     hardening of the arteries, poor circulation  . Ovarian cancer Sister 34    stage IV, now 61    PHYSICAL EXAM: Filed Vitals:   10/22/12 1206  BP: 140/78  Pulse: 84  Weight: 167 lb (75.751 kg)  SpO2: 96%   General: No resp difficulty HEENT: normal Neck: supple. JVP 7-8 Carotids 2+ bilaterally; no bruits. No lymphadenopathy or thryomegaly appreciated. Cor: PMI normal. Irregular rate & rhythm. No rubs, gallops 2/6 TR worse with inspiration. Mildly increased P2 no RV lift  Lungs: clear 2 liters [place] Abdomen: soft, nontender, nondistended. No hepatosplenomegaly. No bruits or masses. Good bowel sounds. Extremities: no cyanosis, clubbing, rash, tr edema  Neuro: alert & orientedx3, cranial nerves grossly intact. Moves all 4 extremities w/o difficulty. Affect pleasant.    ASSESSMENT & PLAN:

## 2012-10-23 ENCOUNTER — Encounter (HOSPITAL_COMMUNITY)
Admission: RE | Admit: 2012-10-23 | Discharge: 2012-10-23 | Disposition: A | Discharge status: Home / Self Care | Payer: BC Managed Care – PPO | Source: Ambulatory Visit | Attending: Pulmonary Disease | Admitting: Pulmonary Disease

## 2012-10-26 NOTE — Assessment & Plan Note (Addendum)
Patient seen and examined with Tonye Becket, NP. We discussed all aspects of the encounter. I agree with the assessment as stated above.   Overall much improved. Echo reviewed personally EF normal. Pulmonary pressures stable to slightly improved. RV only mildly decreased. RHC cath reviewed and PVR not significantly elevated so have decided to forego selective pulmonary vasodilators. Focus on keeping O2 up at all times, volume management, weight loss and pulmonary rehab. If symptoms worsen can consider repeat cath to reassess hemodynamics. Reinforced need for daily weights and reviewed use of sliding scale diuretics. Will follow closely.

## 2012-10-28 ENCOUNTER — Encounter (HOSPITAL_COMMUNITY)
Admission: RE | Admit: 2012-10-28 | Discharge: 2012-10-28 | Disposition: A | Discharge status: Home / Self Care | Payer: BC Managed Care – PPO | Source: Ambulatory Visit | Attending: Pulmonary Disease | Admitting: Pulmonary Disease

## 2012-10-28 DIAGNOSIS — J45909 Unspecified asthma, uncomplicated: Secondary | ICD-10-CM | POA: Insufficient documentation

## 2012-10-28 DIAGNOSIS — Z5189 Encounter for other specified aftercare: Secondary | ICD-10-CM | POA: Insufficient documentation

## 2012-10-28 DIAGNOSIS — I2789 Other specified pulmonary heart diseases: Secondary | ICD-10-CM | POA: Insufficient documentation

## 2012-10-28 DIAGNOSIS — E669 Obesity, unspecified: Secondary | ICD-10-CM | POA: Insufficient documentation

## 2012-10-28 NOTE — Progress Notes (Signed)
[id number] Reiterated home exercise guidelines with patient including oxygen use, oxygen saturation, endpoints, and use of rate of perceived dyspnea and exertion scales. Pt has been walking 3 times per week in addition to pulmonary rehab and [facility]. Pt's goals are still to improve lung capacity, lose weight, and build stamina. Pt voices understanding of instructions given.  Cristy Hilts, MS, ACSM CES

## 2012-10-30 ENCOUNTER — Encounter (HOSPITAL_COMMUNITY)
Admission: RE | Admit: 2012-10-30 | Discharge: 2012-10-30 | Disposition: A | Discharge status: Home / Self Care | Payer: BC Managed Care – PPO | Source: Ambulatory Visit | Attending: Pulmonary Disease | Admitting: Pulmonary Disease

## 2012-11-04 ENCOUNTER — Encounter (HOSPITAL_COMMUNITY): Payer: BC Managed Care – PPO

## 2012-11-06 ENCOUNTER — Encounter (HOSPITAL_COMMUNITY): Payer: BC Managed Care – PPO

## 2012-11-11 ENCOUNTER — Encounter (HOSPITAL_COMMUNITY)
Admission: RE | Admit: 2012-11-11 | Discharge: 2012-11-11 | Disposition: A | Discharge status: Home / Self Care | Payer: BC Managed Care – PPO | Source: Ambulatory Visit | Attending: Pulmonary Disease | Admitting: Pulmonary Disease

## 2012-11-13 ENCOUNTER — Encounter (HOSPITAL_COMMUNITY)
Admission: RE | Admit: 2012-11-13 | Discharge: 2012-11-13 | Disposition: A | Discharge status: Home / Self Care | Payer: BC Managed Care – PPO | Source: Ambulatory Visit | Attending: Pulmonary Disease | Admitting: Pulmonary Disease

## 2012-11-18 ENCOUNTER — Encounter (HOSPITAL_COMMUNITY)
Admission: RE | Admit: 2012-11-18 | Discharge: 2012-11-18 | Disposition: A | Discharge status: Home / Self Care | Payer: BC Managed Care – PPO | Source: Ambulatory Visit | Attending: Pulmonary Disease | Admitting: Pulmonary Disease

## 2012-11-20 ENCOUNTER — Encounter (HOSPITAL_COMMUNITY)
Admission: RE | Admit: 2012-11-20 | Discharge: 2012-11-20 | Disposition: A | Discharge status: Home / Self Care | Payer: BC Managed Care – PPO | Source: Ambulatory Visit | Attending: Pulmonary Disease | Admitting: Pulmonary Disease

## 2012-11-25 ENCOUNTER — Encounter (HOSPITAL_COMMUNITY): Payer: BC Managed Care – PPO

## 2012-11-27 ENCOUNTER — Encounter (HOSPITAL_COMMUNITY): Payer: BC Managed Care – PPO

## 2012-12-02 ENCOUNTER — Telehealth: Payer: Self-pay | Admitting: Oncology

## 2012-12-02 ENCOUNTER — Encounter (HOSPITAL_COMMUNITY)
Admission: RE | Admit: 2012-12-02 | Discharge: 2012-12-02 | Disposition: A | Discharge status: Home / Self Care | Payer: BC Managed Care – PPO | Source: Ambulatory Visit | Attending: Pulmonary Disease | Admitting: Pulmonary Disease

## 2012-12-02 ENCOUNTER — Ambulatory Visit (HOSPITAL_BASED_OUTPATIENT_CLINIC_OR_DEPARTMENT_OTHER): Payer: BC Managed Care – PPO | Admitting: Oncology

## 2012-12-02 ENCOUNTER — Other Ambulatory Visit: Payer: Self-pay | Admitting: *Deleted

## 2012-12-02 ENCOUNTER — Other Ambulatory Visit (HOSPITAL_BASED_OUTPATIENT_CLINIC_OR_DEPARTMENT_OTHER): Payer: BC Managed Care – PPO | Admitting: Lab

## 2012-12-02 VITALS — BP 125/86 | HR 84 | Temp 98.1°F | Resp 18 | Ht 62.0 in | Wt 164.4 lb

## 2012-12-02 DIAGNOSIS — D559 Anemia due to enzyme disorder, unspecified: Secondary | ICD-10-CM

## 2012-12-02 DIAGNOSIS — D594 Other nonautoimmune hemolytic anemias: Secondary | ICD-10-CM

## 2012-12-02 DIAGNOSIS — J45909 Unspecified asthma, uncomplicated: Secondary | ICD-10-CM | POA: Insufficient documentation

## 2012-12-02 DIAGNOSIS — E669 Obesity, unspecified: Secondary | ICD-10-CM | POA: Insufficient documentation

## 2012-12-02 DIAGNOSIS — Z5189 Encounter for other specified aftercare: Secondary | ICD-10-CM | POA: Insufficient documentation

## 2012-12-02 DIAGNOSIS — I2789 Other specified pulmonary heart diseases: Secondary | ICD-10-CM | POA: Insufficient documentation

## 2012-12-02 LAB — FERRITIN CHCC: Ferritin: 73 ng/ml (ref 9–269)

## 2012-12-02 LAB — CBC WITH DIFFERENTIAL/PLATELET
BASO%: 0.5 % (ref 0.0–2.0)
Basophils Absolute: 0.1 10*3/uL (ref 0.0–0.1)
EOS%: 3.9 % (ref 0.0–7.0)
MCH: 36.1 pg — ABNORMAL HIGH (ref 25.1–34.0)
MCHC: 31.1 g/dL — ABNORMAL LOW (ref 31.5–36.0)
MCV: 116.1 fL — ABNORMAL HIGH (ref 79.5–101.0)
MONO%: 16.5 % — ABNORMAL HIGH (ref 0.0–14.0)
RDW: 15.2 % — ABNORMAL HIGH (ref 11.2–14.5)
lymph#: 6.3 10*3/uL — ABNORMAL HIGH (ref 0.9–3.3)

## 2012-12-02 MED ORDER — DEFERASIROX 500 MG PO TBSO: 500.0000 mg | ORAL_TABLET | Freq: Every day | ORAL | Status: DC

## 2012-12-02 NOTE — Telephone Encounter (Signed)
gv and printed appt sched and avs for pt  °

## 2012-12-02 NOTE — Progress Notes (Signed)
   [facility]    OFFICE PROGRESS NOTE   INTERVAL HISTORY:   She returns as scheduled. She continues Exjade. She has been diagnosed with pulmonary hypertension and is using home oxygen. She takes tramadol 4 times per day for relief of bone pain. Objective:  Vital signs in last 24 hours:  Blood pressure 125/86, pulse 84, temperature 98.1 F (36.7 C), temperature source Oral, resp. rate 18, height 5\' 2"  (1.575 m), weight 164 lb 6.4 oz (74.571 kg).   Resp: Lungs clear bilaterally Cardio: Regular rhythm with premature beats GI: No hepatomegaly Vascular: No leg edema   Lab Results:  Lab Results  Component Value Date   WBC 17.7* 12/02/2012   HGB 9.9* 12/02/2012   HCT 31.8* 12/02/2012   MCV 116.1* 12/02/2012   PLT 614* 12/02/2012    Ferritin 53 on 08/05/2012  Medications: I have reviewed the patient's current medications.  Assessment/Plan: 1. 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 2. Secondary iron overload: Maintained on Exjade. Exjade was resumed 10/14/2009. We will follow up on a ferritin level from today. 3. Hospitalization 05/06 through 10/06/2009 with acute renal failure, likely secondary to dehydration. She was also noted to have an elevated ammonia level. The ammonia level normalized during the hospitalization.  4. Hospitalization 03/18 through 08/16/2009 with pneumonia. 5. History of atrial fibrillation/flutter: Status post a Maze procedure. 6. History of mitral regurgitation: Status post mitral valve repair by Dr. Cornelius Moras in February 2010. 7. History of a pericardial effusion. 8. Chronic bone pain: Controlled with tramadol. 9. 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 10. Exertional dyspnea: Likely related to multiple factors including cardiac disease, diaphragmatic paralysis, anemia, asthma and pulmonary hypertension. She is followed by Dr. Shelle Iron and Dr. Gala Romney 11. Pneumococcal vaccine-she had a  local reaction to a pneumococcal vaccine in the remote past. She tolerated the pneumococcal vaccine in December of 2007 without apparent toxicity. She received a pneumococcal vaccine on 04/08/2012  Disposition:  She appears stable from a hematologic standpoint. We will followup on the ferritin level from today. She continues Exjade. Ms. Allie will return for a lab visit in 3 months. She is scheduled for a 6 month office visit.   Thornton Papas, MD  12/02/2012  12:31 PM

## 2012-12-03 ENCOUNTER — Telehealth: Payer: Self-pay | Admitting: *Deleted

## 2012-12-03 NOTE — Telephone Encounter (Signed)
Message copied by Caleb Popp on Wed Dec 03, 2012  1:36 PM ------      Message from: Ladene Artist      Created: Tue Dec 02, 2012  8:36 PM       Please call patient, ferritin is ok, f/u as scheduled ------

## 2012-12-03 NOTE — Telephone Encounter (Signed)
Called pt with lab results. Ferritin is OK, per Dr. Truett Perna. Follow up as scheduled. She voiced understanding.

## 2012-12-04 ENCOUNTER — Encounter (HOSPITAL_COMMUNITY)
Admission: RE | Admit: 2012-12-04 | Discharge: 2012-12-04 | Disposition: A | Discharge status: Home / Self Care | Payer: BC Managed Care – PPO | Source: Ambulatory Visit | Attending: Pulmonary Disease | Admitting: Pulmonary Disease

## 2012-12-09 ENCOUNTER — Encounter (HOSPITAL_COMMUNITY): Payer: BC Managed Care – PPO

## 2012-12-10 ENCOUNTER — Telehealth: Payer: Self-pay | Admitting: *Deleted

## 2012-12-10 DIAGNOSIS — D594 Other nonautoimmune hemolytic anemias: Secondary | ICD-10-CM

## 2012-12-10 MED ORDER — DEFERASIROX 500 MG PO TBSO: 1500.0000 mg | ORAL_TABLET | Freq: Every day | ORAL | Status: DC

## 2012-12-10 NOTE — Telephone Encounter (Signed)
Received fax from pharmacy needing current height and weight to process script and confirm dose. Made [place] aware her insurance only allows 30 day supply now instead of 90 day.

## 2012-12-12 ENCOUNTER — Telehealth: Payer: Self-pay | Admitting: *Deleted

## 2012-12-12 NOTE — Telephone Encounter (Signed)
Message from pt stating [facility] has not been able to get in touch with this office. Spoke with [employer], [employer]. She reports prescription was entered by a Teacher, English as a foreign language and has to be reprocessed. They have all necessary info required. Requested pharmacy contact pt with update.

## 2013-01-06 ENCOUNTER — Ambulatory Visit (HOSPITAL_COMMUNITY)
Admission: RE | Admit: 2013-01-06 | Discharge: 2013-01-06 | Disposition: A | Discharge status: Home / Self Care | Payer: BC Managed Care – PPO | Source: Ambulatory Visit | Attending: Internal Medicine | Admitting: Internal Medicine

## 2013-01-06 VITALS — BP 128/76 | HR 82 | Wt 167.0 lb

## 2013-01-06 DIAGNOSIS — E669 Obesity, unspecified: Secondary | ICD-10-CM | POA: Insufficient documentation

## 2013-01-06 DIAGNOSIS — R002 Palpitations: Secondary | ICD-10-CM | POA: Insufficient documentation

## 2013-01-06 DIAGNOSIS — G4733 Obstructive sleep apnea (adult) (pediatric): Secondary | ICD-10-CM | POA: Insufficient documentation

## 2013-01-06 DIAGNOSIS — I272 Pulmonary hypertension, unspecified: Secondary | ICD-10-CM

## 2013-01-06 DIAGNOSIS — D559 Anemia due to enzyme disorder, unspecified: Secondary | ICD-10-CM | POA: Insufficient documentation

## 2013-01-06 DIAGNOSIS — I10 Essential (primary) hypertension: Secondary | ICD-10-CM | POA: Insufficient documentation

## 2013-01-06 DIAGNOSIS — I2789 Other specified pulmonary heart diseases: Secondary | ICD-10-CM

## 2013-01-06 DIAGNOSIS — I059 Rheumatic mitral valve disease, unspecified: Secondary | ICD-10-CM | POA: Insufficient documentation

## 2013-01-06 NOTE — Progress Notes (Signed)
Patient ID: Fabio Pierce, female   DOB: Mar 19, 1951, 62 y.o.   MRN: [id number]  HPI: Ms. Wiederhold is a 62 y/o woman with h/o HTN, pyruvate kinase deficiency with hemolytic anemia, obesity, asthma, OSA (on CPAP), mitral valve prolapse s/p mini MV repair/Maze (2011 with Dr. Cornelius Moras), PAF s/p RFA followed by surgical Maze 2011 and diastolic dysfunction.  Pre-op cath with normal coronaries. 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   Echo 12/13 EF 60-65% mild LVH. Grade 2 DD. MVR stable (no MS noted). Moderate LAE. RV mildly dilated. Mod-severe TR with estimated RVSP 80-90 range. (in 2011 was 40-93mm HG)  Recently had LifeWatch monitor for palpitations - frequent PACs, occasional brief (3-4 beats) PSVT. Metoprolol increased.   Baseline hgb 7-9. Doesn't get transfused unless 6 or below.  RHC 06/24/12 RA = 10  RV = 60/7/12  PA = 58/25 (40)  PCW = 19 (no significant v-waves)  Fick cardiac output/index = 6.6/3.7  PVR = 3.1 [place]  FA sat = 91%  PA sat = 66%, 67%  ECHO 10/22/12 EF 55% RV mildly decreased + Septal bounce. Moderate TR. RVSP 65-70. +diastolic dysfunction (likely restrictive - but tissue Doppler not performed).   She returns for follow up of her pulmonary HTN today with her husband. SOB with exertion but she says it is unchanged. Denies PND/Orthopnea. Denies syncope. Uses chronic 2 liters oxygen with exertion but usually just at night. Uses CPAP every night. Compliant with medications. Completed pulmonary rehab. Weigh at home 164-166 pounds. She says she takes an extra 20 mg of lasix for lower extremity edema.   12/24/12 Potassium 4.6 BUN 16 Creatinine 0.68 hemoglobin 9     ROS: All systems negative except as listed in HPI, PMH and Problem List.  Past Medical History  Diagnosis Date  . Pyruvate kinase deficiency hemolytic anemia   . Pyruvate kinase deficiency hemolytic anemia   . Atrial fibrillation   . Asthma   . Heart murmur   . Syncope and collapse  2012    r/t anemia    Current Outpatient Prescriptions  Medication Sig Dispense Refill  . aspirin (BAYER ASPIRIN) 325 MG tablet Take 1 tablet (325 mg total) by mouth daily.  30 tablet  3  . budesonide-formoterol (SYMBICORT) 160-4.5 MCG/ACT inhaler Inhale 2 puffs into the lungs 2 (two) times daily.      . calcium carbonate (OS-CAL) 600 MG TABS Take 600 mg by mouth 2 (two) times daily with a meal.      . cholecalciferol (VITAMIN D) 1000 UNITS tablet Take 1,000 Units by mouth daily.      . clonazePAM (KLONOPIN) 1 MG tablet Take 1 mg by mouth at bedtime as needed.       Marland Kitchen EPINEPHrine (EPI-PEN) 0.3 mg/0.3 mL DEVI Inject 0.3 mg into the muscle once as needed.      . Eszopiclone (ESZOPICLONE) 3 MG TABS Take 3 mg by mouth at bedtime as needed (Lunesta (generic)). Take immediately before bedtime      . fexofenadine (ALLEGRA) 180 MG tablet Take 180 mg by mouth daily.      . folic acid (FOLVITE) 1 MG tablet TAKE 1 TABLET DAILY  90 tablet  4  . furosemide (LASIX) 20 MG tablet Take 40 mg by mouth 2 (two) times daily.       . metoprolol (LOPRESSOR) 50 MG tablet Take 75 mg by mouth 2 (two) times daily.       . mometasone (NASONEX)  50 MCG/ACT nasal spray Place 2 sprays into the nose daily.      . NON FORMULARY Inhale 2 L into the lungs continuous. Oxygen at 2 l/min [place]      . pantoprazole (PROTONIX) 40 MG tablet Take 40 mg by mouth daily.      . potassium chloride SA (K-DUR,KLOR-CON) 20 MEQ tablet Take 40 mEq by mouth daily. Take qAM adn qPM      . Probiotic Product (ALIGN) 4 MG CAPS Take 1 tablet by mouth daily.      . raloxifene (EVISTA) 60 MG tablet Take 60 mg by mouth daily.      . sodium chloride (OCEAN) 0.65 % nasal spray Place 2 sprays into the nose as needed.      . traMADol (ULTRAM) 50 MG tablet Take 50 mg by mouth every 6 (six) hours as needed.      Marland Kitchen UNABLE TO FIND Med Name: AirBorne tablets   Chew 2 tabs twice a day      . UNABLE TO FIND Med Name: CPAP      . deferasirox (EXJADE)  500 MG disintegrating tablet Take 3 tablets (1,500 mg total) by mouth daily with supper. Take 3 tablets once daily 30 minutes prior to largest meal  90 tablet  11   No current facility-administered medications for this encounter.   History  Substance Use Topics  . Smoking status: Never Smoker   . Smokeless tobacco: Not on file  . Alcohol Use: No     Comment: socially glass red wine monthly   Family History  Problem Relation Age of Onset  . Breast cancer Mother 63    ER+  . Melanoma Father 65  . Adrenal disorder Sister     pheochromacytoma - rt. adrenal gland  . COPD Maternal Aunt   . Stomach cancer Paternal Uncle     onset in 47s  . Lung cancer Maternal Grandmother     died late 27s, heavy smoker  . Heart disease Maternal Grandfather     hardening of the arteries, poor circulation  . Ovarian cancer Sister 53    stage IV, now 68    PHYSICAL EXAM: Filed Vitals:   01/06/13 0959  BP: 128/76  Pulse: 82  Weight: 167 lb (75.751 kg)  SpO2: 94%   General: No resp difficulty husband present  HEENT: normal Neck: supple. JVP 5-6 Carotids 2+ bilaterally; no bruits. No lymphadenopathy or thryomegaly appreciated. Cor: PMI normal. Irregular rate & rhythm. No rubs, gallops 2/6 TR worse with inspiration. Mildly increased P2 no RV lift  Lungs: clear 2 liters  Abdomen: soft, nontender, nondistended. No hepatosplenomegaly. No bruits or masses. Good bowel sounds. Extremities: no cyanosis, clubbing, rash,  edema  Neuro: alert & orientedx3, cranial nerves grossly intact. Moves all 4 extremities w/o difficulty. Affect pleasant.    ASSESSMENT & PLAN:  Pulmonary Hypertension-  ECHO 10/22/12 EF 55% RV mildly decreased + Septal bounce. Moderate TR. RVSP 65-70. +diastolic dysfunction (likely restrictive - but tissue Doppler not performed).  Completed Pulmonary Rehab Functionally she is doing ok no change. Remains SOB with exertion and continues on home oxygen essentially at night. Continue  current diuretic regimen with additional 20 mg of lasix for lower extremity edema. Continue CPAP. Encouraged to use oxygen to maintain 02 sats > 88%.    Follow up  In 3 months.  CLEGG,AMY 10:30 AM

## 2013-01-06 NOTE — Patient Instructions (Addendum)
Follow up in 3 months   Do the following things EVERYDAY: 1) Weigh yourself in the morning before breakfast. Write it down and keep it in a log. 2) Take your medicines as prescribed 3) Eat low salt foods-Limit salt (sodium) to 2000 mg per day.  4) Stay as active as you can everyday 5) Limit all fluids for the day to less than 2 liters  

## 2013-03-11 ENCOUNTER — Telehealth: Payer: Self-pay | Admitting: *Deleted

## 2013-03-11 NOTE — Telephone Encounter (Signed)
Request for office to call back and give reference #. Called back as requested. They had called to request update on contact information for [name] to schedule shipment. Left VM at her home # to call pharmacy.

## 2013-03-16 ENCOUNTER — Encounter: Payer: Self-pay | Admitting: Cardiology

## 2013-03-16 ENCOUNTER — Encounter: Payer: Self-pay | Admitting: *Deleted

## 2013-03-18 ENCOUNTER — Encounter: Payer: Self-pay | Admitting: Cardiology

## 2013-03-18 DIAGNOSIS — I340 Nonrheumatic mitral (valve) insufficiency: Secondary | ICD-10-CM | POA: Insufficient documentation

## 2013-03-18 DIAGNOSIS — R0902 Hypoxemia: Secondary | ICD-10-CM | POA: Insufficient documentation

## 2013-03-18 DIAGNOSIS — R011 Cardiac murmur, unspecified: Secondary | ICD-10-CM | POA: Insufficient documentation

## 2013-03-19 ENCOUNTER — Ambulatory Visit (INDEPENDENT_AMBULATORY_CARE_PROVIDER_SITE_OTHER): Payer: BC Managed Care – PPO | Admitting: Cardiology

## 2013-03-19 ENCOUNTER — Encounter: Payer: Self-pay | Admitting: Cardiology

## 2013-03-19 VITALS — BP 133/50 | HR 55 | Ht 62.0 in | Wt 172.0 lb

## 2013-03-19 DIAGNOSIS — G4733 Obstructive sleep apnea (adult) (pediatric): Secondary | ICD-10-CM

## 2013-03-19 DIAGNOSIS — I4891 Unspecified atrial fibrillation: Secondary | ICD-10-CM

## 2013-03-19 DIAGNOSIS — I10 Essential (primary) hypertension: Secondary | ICD-10-CM

## 2013-03-19 NOTE — Patient Instructions (Signed)
[facility] contacted [facility] about having [facility]. [facility] Will be contacting you within the next week.   Your physician wants you to follow-up in: 6 Months with Dr. Sherlyn Lick will receive a reminder letter in the mail two months in advance. If you don't receive a letter, please call our office to schedule the follow-up appointment.

## 2013-03-19 NOTE — Progress Notes (Signed)
[address] [place], [place]  [address] Phone: [phone] Fax:  [phone]  Date:  03/19/2013   ID:  [name], DOB [birth year], MRN [id number]  PCP:  Pearla Dubonnet, MD  Cardiologist:  Armanda Magic, MD   History of Present Illness: [name] is a 62 y.o. female with a history of OSA/HTN/afib s/p ablation/Maze, MR s/p MV repair and mild to moderate pulmonary HTN followed by Dr. Gala Romney.  She is doing well.  She is on chronic O2 all the time.  She has chronic DOE which is very stable.  She has chronic LE edema which is intermittent and stable.  She has no PND or orthopnea.  She denies any chest pain or palpitations. She tolerates her CPAP well.  She tolerates her mask without any problems.  She tolerates the pressure and uses her chin strap.  She is having to sleep on her back due to arthritic pain.   Wt Readings from Last 3 Encounters:  03/19/13 172 lb (78.019 kg)  01/06/13 167 lb (75.751 kg)  12/02/12 164 lb 6.4 oz (74.571 kg)     Past Medical History  Diagnosis Date  . Pyruvate kinase deficiency hemolytic anemia   . Asthma   . Syncope and collapse 2012    r/t anemia  . Anemia   . Dyspnea   . Mitral regurgitation     s/p MV repair complicated by diaphragmatic paralysis  . Fatigue   . Myoclonus   . Heart murmur     severe MR s/p MV repair  . GERD (gastroesophageal reflux disease)   . Allergic rhinitis   . Depression   . Gastritis     NSAID induced  . Asthma   . Hx of asplenia     surgical  . Osteoporosis   . Trochanteric bursitis     left hip  . Pulmonary HTN     mild to moderate by RHC 05/2012  . Exercise hypoxemia     supplemental O2 PRN  . Hypertension   . Atrial fibrillation     s/p RFA and Maze procedure - resolved  . Atrial tachycardia     Current Outpatient Prescriptions  Medication Sig Dispense Refill  . aspirin (BAYER ASPIRIN) 325 MG tablet Take 1 tablet (325 mg total) by mouth daily.  30 tablet  3  .  budesonide-formoterol (SYMBICORT) 160-4.5 MCG/ACT inhaler Inhale 2 puffs into the lungs 2 (two) times daily.      . calcium carbonate (OS-CAL) 600 MG TABS Take 600 mg by mouth 2 (two) times daily with a meal.      . cholecalciferol (VITAMIN D) 1000 UNITS tablet Take 1,000 Units by mouth daily.      . clonazePAM (KLONOPIN) 1 MG tablet Take 1 mg by mouth at bedtime as needed.       . deferasirox (EXJADE) 500 MG disintegrating tablet Take 3 tablets (1,500 mg total) by mouth daily with supper. Take 3 tablets once daily 30 minutes prior to largest meal  90 tablet  11  . EPINEPHrine (EPI-PEN) 0.3 mg/0.3 mL DEVI Inject 0.3 mg into the muscle once as needed.      . Eszopiclone (ESZOPICLONE) 3 MG TABS Take 3 mg by mouth at bedtime as needed (Lunesta (generic)). Take immediately before bedtime      . fexofenadine (ALLEGRA) 180 MG tablet Take 180 mg by mouth daily.      . folic acid (FOLVITE) 1 MG tablet TAKE 1 TABLET DAILY  90 tablet  4  . furosemide (LASIX) 20 MG tablet Take 40 mg by mouth 2 (two) times daily.       . metoprolol (LOPRESSOR) 50 MG tablet Take 75 mg by mouth 2 (two) times daily.       . mometasone (NASONEX) 50 MCG/ACT nasal spray Place 2 sprays into the nose daily.      . NON FORMULARY Inhale 2 L into the lungs continuous. Oxygen at 2 l/min [place]      . pantoprazole (PROTONIX) 40 MG tablet Take 40 mg by mouth daily.      . potassium chloride SA (K-DUR,KLOR-CON) 20 MEQ tablet Take 40 mEq by mouth daily. Take qAM adn qPM      . Probiotic Product (ALIGN) 4 MG CAPS Take 1 tablet by mouth daily.      . raloxifene (EVISTA) 60 MG tablet Take 60 mg by mouth daily.      . sodium chloride (OCEAN) 0.65 % nasal spray Place 2 sprays into the nose as needed.      . traMADol (ULTRAM) 50 MG tablet Take 50 mg by mouth every 6 (six) hours as needed.      Marland Kitchen UNABLE TO FIND Med Name: AirBorne tablets   Chew 2 tabs twice a day      . UNABLE TO FIND Med Name: CPAP       No current facility-administered  medications for this visit.    Allergies:    Allergies  Allergen Reactions  . Iodine   . Iohexol      Code: HIVES, Desc: reaction since childhood, Onset Date: 40981191   . Shellfish Allergy     Social History:  The patient  reports that she has never smoked. She does not have any smokeless tobacco history on file. She reports that she drinks alcohol. She reports that she does not use illicit drugs.   Family History:  The patient's family history includes Adrenal disorder in her sister; Breast cancer (age of onset: 26) in her mother; COPD in her maternal aunt; Heart disease in her maternal grandfather; Lung cancer in her maternal grandmother; Melanoma (age of onset: 88) in her father; Ovarian cancer (age of onset: 67) in her sister; Stomach cancer in her paternal uncle.   ROS:  Please see the history of present illness.      All other systems reviewed and negative.   PHYSICAL EXAM: VS:  BP 133/50  Pulse 55  Ht 5\' 2"  (1.575 m)  Wt 172 lb (78.019 kg)  BMI 31.45 kg/m2 Well nourished, well developed, in no acute distress HEENT: normal Neck: no JVD Cardiac:  normal S1, S2; RRR; no murmur Lungs:  clear to auscultation bilaterally, no wheezing, rhonchi or rales Abd: soft, nontender, no hepatomegaly Ext: no edema Skin: warm and dry Neuro:  CNs 2-12 intact, no focal abnormalities noted  EKG:  NSR with sinus arrhythmia, PAC's and PVC's  ASSESSMENT AND PLAN:  1. Atrial fibrillation s/p MAZE and afib ablation maintaining NSR with PAC's and PVC's 2. HTN  - continue metoprolol 3. Mild to moderate pulmonary HTN  - continue Lasix 4. Severe MR s/p MV repair 5. OSA on CPAP  - her CPAP is now over 10 years old and she would like a new machine.  I will call her DME to get a new machine and also get a download 6. Obesity - she is walking daily  Followup with me in 6 months  Signed, Armanda Magic, MD 03/19/2013  10:36 AM

## 2013-03-31 ENCOUNTER — Telehealth: Payer: Self-pay | Admitting: Cardiology

## 2013-03-31 NOTE — Telephone Encounter (Signed)
New message    Want Dr Mayford Knife to know that [facility] sent her the wrong card for her cpap machine.  They will get a correct one to her ASAP and she will get the card to Dr Mayford Knife to be downloaded.

## 2013-03-31 NOTE — Telephone Encounter (Signed)
Spoke w/pt.  She just wanted Dr. Mayford Knife to be aware.

## 2013-04-03 ENCOUNTER — Encounter (HOSPITAL_COMMUNITY): Payer: Self-pay | Admitting: Cardiology

## 2013-04-10 ENCOUNTER — Encounter: Payer: Self-pay | Admitting: *Deleted

## 2013-04-10 NOTE — Progress Notes (Signed)
RECEIVED A FAX FROM [facility] CONCERNING A COPAY ASSISTANCE APPROVAL NOTIFICATION. THIS FORM WAS PLACED ON THE FINANCIAL ASSISTANCE DESK.

## 2013-04-20 ENCOUNTER — Telehealth: Payer: Self-pay | Admitting: Cardiology

## 2013-04-20 NOTE — Telephone Encounter (Signed)
Follow up    Pt called for her Sleep study results please give her a call

## 2013-04-20 NOTE — Telephone Encounter (Signed)
Will need to see download before commenting

## 2013-04-20 NOTE — Telephone Encounter (Signed)
Noted. Will give you hard copy tomorrow during clinic.

## 2013-04-20 NOTE — Telephone Encounter (Signed)
Just received Download. It showed Pressure: Median: 6.6   Maximum: 9.0 Leak Median:1.2 Maximum:26.4 AHI Apnea Index: 0.1 Hypopnea Index 5.6 AHI: 5.7 % time in Apnea 0.0 Usage--> Used days >= 4 hrs:93 total hours used 904.06 Used days <4 hrs:0   To Dr. Mayford Knife to advise. Will give you paper to sign off on tomorrow as well.

## 2013-04-22 ENCOUNTER — Encounter: Payer: Self-pay | Admitting: Oncology

## 2013-04-22 NOTE — Progress Notes (Signed)
Per [facility] the patient has been approved for co pay asst.

## 2013-05-04 ENCOUNTER — Encounter: Payer: Self-pay | Admitting: Cardiology

## 2013-06-01 ENCOUNTER — Telehealth: Payer: Self-pay | Admitting: Oncology

## 2013-06-01 NOTE — Telephone Encounter (Signed)
pt is sick and wanted to r/s to feb with later in day appt

## 2013-06-02 ENCOUNTER — Ambulatory Visit: Payer: BC Managed Care – PPO | Admitting: Oncology

## 2013-06-02 ENCOUNTER — Other Ambulatory Visit: Payer: BC Managed Care – PPO

## 2013-06-03 ENCOUNTER — Encounter: Payer: Self-pay | Admitting: Pulmonary Disease

## 2013-06-03 ENCOUNTER — Ambulatory Visit (INDEPENDENT_AMBULATORY_CARE_PROVIDER_SITE_OTHER): Payer: BC Managed Care – PPO | Admitting: Pulmonary Disease

## 2013-06-03 ENCOUNTER — Encounter (INDEPENDENT_AMBULATORY_CARE_PROVIDER_SITE_OTHER): Payer: Self-pay

## 2013-06-03 VITALS — BP 112/80 | HR 85 | Temp 98.5°F | Ht 62.0 in | Wt 168.8 lb

## 2013-06-03 DIAGNOSIS — J45901 Unspecified asthma with (acute) exacerbation: Secondary | ICD-10-CM

## 2013-06-03 DIAGNOSIS — J45909 Unspecified asthma, uncomplicated: Secondary | ICD-10-CM

## 2013-06-03 MED ORDER — PREDNISONE 10 MG PO TABS: ORAL_TABLET | ORAL | Status: DC

## 2013-06-03 NOTE — Assessment & Plan Note (Signed)
The patient's history is most consistent with an episode of acute asthmatic bronchitis. I suspect this started with a viral process, but now has resulted in chronic airway inflammation with persistent airway symptoms such as cough and congestion. I think she will need a course of prednisone to get her back to baseline.

## 2013-06-03 NOTE — Patient Instructions (Signed)
Will treat with a course of prednisone to help with your airway inflammation. No change in asthma medication.  followup with me in 61mos.

## 2013-06-03 NOTE — Progress Notes (Signed)
   Subjective:    Patient ID: [name], female    DOB: 09/22/1950, 63 y.o.   MRN: [id number]  HPI Patient comes in today for an acute sick visit. She has known asthma, and 4 weeks ago developed what sounds like an acute viral bronchitis. Her cough and congestion have improved, and she is no longer producing purulent mucus. However, she continues to have a rattle in her chest with persistent cough and wheezing at night. She is also had some increasing shortness of breath.   Review of Systems  Constitutional: Negative for fever and unexpected weight change.  HENT: Positive for congestion, postnasal drip and voice change. Negative for dental problem, ear pain, nosebleeds, rhinorrhea, sinus pressure, sneezing, sore throat and trouble swallowing.   Eyes: Negative for redness and itching.  Respiratory: Positive for cough and wheezing. Negative for chest tightness and shortness of breath.   Cardiovascular: Negative for palpitations and leg swelling.  Gastrointestinal: Negative for nausea and vomiting.  Genitourinary: Negative for dysuria.  Musculoskeletal: Negative for joint swelling.  Skin: Negative for rash.  Neurological: Negative for headaches.  Hematological: Does not bruise/bleed easily.  Psychiatric/Behavioral: Negative for dysphoric mood. The patient is not nervous/anxious.        Objective:   Physical Exam Well-developed female in no acute distress Nose without purulence or discharge noted Oropharynx clear Neck without lymphadenopathy or thyromegaly Chest with a few rhonchi, but adequate air flow and no wheezing Cardiac exam with regular rate and rhythm Lower extremities without edema, no cyanosis Alert and oriented, moves all 4 extremities.       Assessment & Plan:

## 2013-06-24 ENCOUNTER — Encounter: Payer: Self-pay | Admitting: Cardiology

## 2013-07-23 ENCOUNTER — Ambulatory Visit: Payer: BC Managed Care – PPO | Admitting: Oncology

## 2013-07-23 ENCOUNTER — Other Ambulatory Visit: Payer: BC Managed Care – PPO

## 2013-07-23 ENCOUNTER — Telehealth: Payer: Self-pay | Admitting: Oncology

## 2013-07-23 NOTE — Telephone Encounter (Signed)
pt called to r/s missed appt due to weather....done...pt aware of new d.t

## 2013-07-30 ENCOUNTER — Encounter (HOSPITAL_COMMUNITY): Payer: Self-pay | Admitting: Cardiology

## 2013-08-20 ENCOUNTER — Telehealth: Payer: Self-pay | Admitting: Oncology

## 2013-08-20 NOTE — Telephone Encounter (Signed)
pt cld to cancel appt for 3/30-resch pt for 4/27 lab @ 10:30 & appt for 11:00

## 2013-08-24 ENCOUNTER — Ambulatory Visit: Payer: BC Managed Care – PPO | Admitting: Oncology

## 2013-08-24 ENCOUNTER — Other Ambulatory Visit: Payer: BC Managed Care – PPO

## 2013-08-26 ENCOUNTER — Ambulatory Visit (HOSPITAL_COMMUNITY)
Admission: RE | Admit: 2013-08-26 | Discharge: 2013-08-26 | Disposition: A | Discharge status: Home / Self Care | Payer: BC Managed Care – PPO | Source: Ambulatory Visit | Attending: Internal Medicine | Admitting: Internal Medicine

## 2013-08-26 ENCOUNTER — Encounter (HOSPITAL_COMMUNITY): Payer: Self-pay | Admitting: *Deleted

## 2013-08-26 VITALS — BP 132/70 | Wt 167.8 lb

## 2013-08-26 DIAGNOSIS — I4891 Unspecified atrial fibrillation: Secondary | ICD-10-CM | POA: Diagnosis not present

## 2013-08-26 DIAGNOSIS — I5032 Chronic diastolic (congestive) heart failure: Secondary | ICD-10-CM

## 2013-08-26 DIAGNOSIS — I2789 Other specified pulmonary heart diseases: Secondary | ICD-10-CM | POA: Diagnosis not present

## 2013-08-26 DIAGNOSIS — I509 Heart failure, unspecified: Secondary | ICD-10-CM | POA: Diagnosis not present

## 2013-08-26 DIAGNOSIS — D594 Other nonautoimmune hemolytic anemias: Secondary | ICD-10-CM | POA: Diagnosis not present

## 2013-08-26 DIAGNOSIS — G4733 Obstructive sleep apnea (adult) (pediatric): Secondary | ICD-10-CM | POA: Diagnosis not present

## 2013-08-26 DIAGNOSIS — I272 Pulmonary hypertension, unspecified: Secondary | ICD-10-CM

## 2013-08-26 MED ORDER — FUROSEMIDE 40 MG PO TABS: ORAL_TABLET | ORAL | Status: DC

## 2013-08-26 MED ORDER — POTASSIUM CHLORIDE CRYS ER 20 MEQ PO TBCR: 40.0000 meq | EXTENDED_RELEASE_TABLET | Freq: Two times a day (BID) | ORAL | Status: DC

## 2013-08-26 MED ORDER — APIXABAN 5 MG PO TABS: 5.0000 mg | ORAL_TABLET | Freq: Two times a day (BID) | ORAL | Status: DC

## 2013-08-26 NOTE — Progress Notes (Signed)
Patient ID: [name], female   DOB: Jun 05, 1950, 63 y.o.   MRN: [id number]  HPI: [name] is a 63 y/o woman with h/o HTN, pyruvate kinase deficiency with hemolytic anemia, obesity, asthma, OSA (on CPAP), mitral valve prolapse s/p mini MV repair/Maze (2011 with Dr. [name]), PAF s/p RFA followed by surgical Maze 9509 and diastolic dysfunction.  Pre-op cath with normal coronaries in 2011. 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   Echo 12/13 EF 60-65% mild LVH. Grade 2 DD. MVR stable (no MS noted). Moderate LAE. RV mildly dilated. Mod-severe TR with estimated RVSP 80-90 range. (in 2011 was 40-14mm HG)  Recently had LifeWatch monitor for palpitations - frequent PACs, occasional brief (3-4 beats) PSVT. Metoprolol increased.   Baseline hgb 7-9. Doesn't get transfused unless 6 or below.  [place] 06/24/12 RA = 10  RV = 60/7/12  PA = 58/25 (40)  PCW = 19 (no significant v-waves)  Fick cardiac output/index = 6.6/3.7  PVR = 3.1 [place]  FA sat = 91%  PA sat = 66%, 67%  PVR was not significantly elevated so selective pulmonary vasodilators were noted started.   ECHO 10/22/12 EF 55% RV mildly decreased + Septal bounce. Moderate TR. RVSP 65-70. +diastolic dysfunction (likely restrictive - but tissue Doppler not performed).   Patient is using oxygen during the day and CPAP at night.  Weight is stable.  For the last 2 months, she has felt worse.  She has had increased exertional dyspnea.  She is short of breath climbing steps or after walking 200 feet.  This is new.  No chest pain.  She is occasionally lightheaded with standing.  No orthopnea or PND.    ECG: coarse atrial fibrillation, rate 71  Labs (7/14): Potassium 4.6 BUN 16 Creatinine 0.68 hemoglobin 9, LDL 63  ROS: All systems negative except as listed in HPI, PMH and Problem List.  Past Medical History  Diagnosis Date  . Pyruvate kinase deficiency hemolytic anemia   . Asthma   . Syncope and collapse 2012    r/t  anemia  . Anemia   . Dyspnea   . Mitral regurgitation     s/p MV repair complicated by diaphragmatic paralysis  . Fatigue   . Myoclonus   . Heart murmur     severe MR s/p MV repair  . GERD (gastroesophageal reflux disease)   . Allergic rhinitis   . Depression   . Gastritis     NSAID induced  . Asthma   . Hx of asplenia     surgical  . Osteoporosis   . Trochanteric bursitis     left hip  . Pulmonary HTN     mild to moderate by [place] 05/2012  . Exercise hypoxemia     supplemental O2 PRN  . Hypertension   . Atrial fibrillation     s/p RFA and Maze procedure - resolved  . Atrial tachycardia     Current Outpatient Prescriptions  Medication Sig Dispense Refill  . budesonide-formoterol (SYMBICORT) 160-4.5 MCG/ACT inhaler Inhale 2 puffs into the lungs 2 (two) times daily.      . calcium carbonate (OS-CAL) 600 MG TABS Take 600 mg by mouth 2 (two) times daily with a meal.      . cholecalciferol (VITAMIN D) 1000 UNITS tablet Take 1,000 Units by mouth daily.      . clonazePAM (KLONOPIN) 1 MG tablet Take 1 mg by mouth at bedtime as needed.       Marland Kitchen  deferasirox (EXJADE) 500 MG disintegrating tablet Take 3 tablets (1,500 mg total) by mouth daily with supper. Take 3 tablets once daily 30 minutes prior to largest meal  90 tablet  11  . EPINEPHrine (EPI-PEN) 0.3 mg/0.3 mL DEVI Inject 0.3 mg into the muscle once as needed.      . fexofenadine (ALLEGRA) 180 MG tablet Take 180 mg by mouth daily.      . folic acid (FOLVITE) 1 MG tablet TAKE 1 TABLET DAILY  90 tablet  4  . furosemide (LASIX) 40 MG tablet Take 2 tabs (80 mg) in AM and 1 tab (40 mg) in PM  90 tablet  3  . metoprolol (LOPRESSOR) 50 MG tablet Take 75 mg by mouth 2 (two) times daily.       . mometasone (NASONEX) 50 MCG/ACT nasal spray Place 2 sprays into the nose daily.      . Multiple Vitamins-Minerals (AIRBORNE) CHEW Chew 2 tablets by mouth 2 (two) times daily.      . NON FORMULARY Inhale 2 L into the lungs continuous. Oxygen at 2  l/min [place]      . pantoprazole (PROTONIX) 40 MG tablet Take 40 mg by mouth daily.      Marland Kitchen Phenylephrine-APAP-Guaifenesin (MUCINEX FAST-MAX COLD & SINUS PO) Take by mouth as needed.      . potassium chloride SA (K-DUR,KLOR-CON) 20 MEQ tablet Take 2 tablets (40 mEq total) by mouth 2 (two) times daily.  120 tablet  3  . Probiotic Product (ALIGN) 4 MG CAPS Take 1 tablet by mouth daily.      . raloxifene (EVISTA) 60 MG tablet Take 60 mg by mouth daily.      . sodium chloride (OCEAN) 0.65 % nasal spray Place 2 sprays into the nose as needed.      . traMADol (ULTRAM) 50 MG tablet Take 50 mg by mouth every 6 (six) hours as needed.      Marland Kitchen UNABLE TO FIND Med Name: CPAP      . apixaban (ELIQUIS) 5 MG TABS tablet Take 1 tablet (5 mg total) by mouth 2 (two) times daily.  60 tablet  6   No current facility-administered medications for this encounter.   History  Substance Use Topics  . Smoking status: Never Smoker   . Smokeless tobacco: Not on file  . Alcohol Use: Yes     Comment: socially glass red wine monthly   Family History  Problem Relation Age of Onset  . Breast cancer Mother 82    ER+  . Melanoma Father 25  . Adrenal disorder Sister     pheochromacytoma - rt. adrenal gland  . COPD Maternal Aunt   . Stomach cancer Paternal Uncle     onset in 20s  . Lung cancer Maternal Grandmother     died late 77s, heavy smoker  . Heart disease Maternal Grandfather     hardening of the arteries, poor circulation  . Ovarian cancer Sister 57    stage IV, now 65    PHYSICAL EXAM: Filed Vitals:   08/26/13 1124  BP: 132/70  Weight: 167 lb 12.8 oz (76.114 kg)   General: NAD HEENT: normal Neck: supple. JVP 10-12 cm. Carotids 2+ bilaterally; no bruits. No lymphadenopathy or thryomegaly appreciated. Cor: PMI normal. Irregular rate & rhythm. No rubs, gallops.  1/6 HSM LLSB. Mildly increased P2 no RV lift  Lungs: clear 2 liters [place] Abdomen: soft, nontender, nondistended. No hepatosplenomegaly. No bruits or  masses. Good bowel sounds.  Extremities: no cyanosis, clubbing, rash,  edema  Neuro: alert & orientedx3, cranial nerves grossly intact. Moves all 4 extremities w/o difficulty. Affect pleasant.  ASSESSMENT & PLAN: 1. Atrial fibrillation: Patient is in atrial fibrillation today.  Rate is controlled on metoprolol.  This may be why she has felt worse for the last few weeks, suspect it has worsened her CHF.  She has not had documented atrial fibrillation since her Maze and has not been anticoagulated.  - Start apixaban 5 mg bid.  Will get recent labs from PCP (done yesterday).  Can stop ASA since starting apixaban.  - We discussed DCCV after 1 month apixaban versus TEE-guided DCCV more soon.  She does not feel like she is markedly symptomatic so would prefer to wait 1 month on apixaban for DCCV w/o TEE. 2. Pulmonary arterial hypertension: PVR was only 3.1 WU on RHC. The [place] may be primarily Who Group 3 in the setting of OSA and chronic respiratory failure (on home oxygen, ?OHS).  - Continue OSA at night and oxygen during the day.  - If symptoms don't improve with DCCV, would consider repeat RHC.  3. Chronic diastolic CHF: Patient is volume overloaded on exam.   - Increase Lasix to 80 mg qam, 40 qpm.   - Increase KCl to 40 bid - Followup in 1 week with BMET/BNP and office visit.  - I will try to get labs from yesterday from PCP.   Loralie Champagne 08/26/2013

## 2013-08-26 NOTE — Patient Instructions (Signed)
Start Eliquis 5 mg Twice daily   Increase Furosemide (Lasix) to 80 mg in AM and 40 mg in PM  Increase Potassium to 40 meq (2 tabs) Twice daily   Your physician has recommended that you have a Cardioversion (DCCV). Electrical Cardioversion uses a jolt of electricity to your heart either through paddles or wired patches attached to your chest. This is a controlled, usually prescheduled, procedure. Defibrillation is done under light anesthesia in the hospital, and you usually go home the day of the procedure. This is done to get your heart back into a normal rhythm. You are not awake for the procedure. Please see the instruction sheet given to you today.  Your physician recommends that you schedule a follow-up appointment in: 1 week with labs

## 2013-08-31 ENCOUNTER — Other Ambulatory Visit: Payer: Self-pay | Admitting: *Deleted

## 2013-09-03 ENCOUNTER — Encounter (HOSPITAL_COMMUNITY): Payer: BC Managed Care – PPO

## 2013-09-07 ENCOUNTER — Encounter: Payer: Self-pay | Admitting: Internal Medicine

## 2013-09-08 ENCOUNTER — Inpatient Hospital Stay (HOSPITAL_COMMUNITY)
Admission: AD | Admit: 2013-09-08 | Discharge: 2013-09-11 | DRG: 190 | Disposition: A | Discharge status: Home / Self Care | Payer: Medicare Other | Source: Ambulatory Visit | Attending: Pulmonary Disease | Admitting: Pulmonary Disease

## 2013-09-08 ENCOUNTER — Telehealth: Payer: Self-pay | Admitting: *Deleted

## 2013-09-08 ENCOUNTER — Encounter: Payer: Self-pay | Admitting: Cardiology

## 2013-09-08 ENCOUNTER — Encounter (HOSPITAL_COMMUNITY): Payer: Self-pay

## 2013-09-08 ENCOUNTER — Encounter: Payer: Self-pay | Admitting: Adult Health

## 2013-09-08 ENCOUNTER — Ambulatory Visit (INDEPENDENT_AMBULATORY_CARE_PROVIDER_SITE_OTHER): Payer: BC Managed Care – PPO | Admitting: Adult Health

## 2013-09-08 ENCOUNTER — Inpatient Hospital Stay (HOSPITAL_COMMUNITY): Payer: Medicare Other

## 2013-09-08 VITALS — BP 128/80 | HR 119 | Temp 98.7°F | Ht 62.5 in | Wt 166.8 lb

## 2013-09-08 DIAGNOSIS — J309 Allergic rhinitis, unspecified: Secondary | ICD-10-CM

## 2013-09-08 DIAGNOSIS — I509 Heart failure, unspecified: Secondary | ICD-10-CM | POA: Diagnosis present

## 2013-09-08 DIAGNOSIS — J9610 Chronic respiratory failure, unspecified whether with hypoxia or hypercapnia: Secondary | ICD-10-CM | POA: Diagnosis not present

## 2013-09-08 DIAGNOSIS — R05 Cough: Secondary | ICD-10-CM | POA: Diagnosis not present

## 2013-09-08 DIAGNOSIS — J45909 Unspecified asthma, uncomplicated: Secondary | ICD-10-CM

## 2013-09-08 DIAGNOSIS — J986 Disorders of diaphragm: Secondary | ICD-10-CM

## 2013-09-08 DIAGNOSIS — R0902 Hypoxemia: Secondary | ICD-10-CM

## 2013-09-08 DIAGNOSIS — I5033 Acute on chronic diastolic (congestive) heart failure: Secondary | ICD-10-CM

## 2013-09-08 DIAGNOSIS — I5032 Chronic diastolic (congestive) heart failure: Secondary | ICD-10-CM

## 2013-09-08 DIAGNOSIS — I369 Nonrheumatic tricuspid valve disorder, unspecified: Secondary | ICD-10-CM | POA: Diagnosis not present

## 2013-09-08 DIAGNOSIS — I4891 Unspecified atrial fibrillation: Secondary | ICD-10-CM | POA: Diagnosis present

## 2013-09-08 DIAGNOSIS — J45901 Unspecified asthma with (acute) exacerbation: Secondary | ICD-10-CM | POA: Diagnosis present

## 2013-09-08 DIAGNOSIS — G4733 Obstructive sleep apnea (adult) (pediatric): Secondary | ICD-10-CM | POA: Diagnosis present

## 2013-09-08 DIAGNOSIS — R509 Fever, unspecified: Secondary | ICD-10-CM | POA: Diagnosis not present

## 2013-09-08 DIAGNOSIS — I10 Essential (primary) hypertension: Secondary | ICD-10-CM | POA: Diagnosis present

## 2013-09-08 DIAGNOSIS — D591 Autoimmune hemolytic anemia, unspecified: Secondary | ICD-10-CM | POA: Diagnosis present

## 2013-09-08 DIAGNOSIS — D594 Other nonautoimmune hemolytic anemias: Secondary | ICD-10-CM

## 2013-09-08 DIAGNOSIS — E139 Other specified diabetes mellitus without complications: Secondary | ICD-10-CM | POA: Diagnosis not present

## 2013-09-08 DIAGNOSIS — R059 Cough, unspecified: Secondary | ICD-10-CM | POA: Diagnosis not present

## 2013-09-08 DIAGNOSIS — J441 Chronic obstructive pulmonary disease with (acute) exacerbation: Secondary | ICD-10-CM | POA: Diagnosis not present

## 2013-09-08 DIAGNOSIS — E662 Morbid (severe) obesity with alveolar hypoventilation: Secondary | ICD-10-CM | POA: Diagnosis present

## 2013-09-08 DIAGNOSIS — IMO0001 Reserved for inherently not codable concepts without codable children: Secondary | ICD-10-CM

## 2013-09-08 DIAGNOSIS — T380X5A Adverse effect of glucocorticoids and synthetic analogues, initial encounter: Secondary | ICD-10-CM | POA: Diagnosis not present

## 2013-09-08 DIAGNOSIS — Z7901 Long term (current) use of anticoagulants: Secondary | ICD-10-CM | POA: Diagnosis not present

## 2013-09-08 DIAGNOSIS — Z9981 Dependence on supplemental oxygen: Secondary | ICD-10-CM

## 2013-09-08 DIAGNOSIS — K219 Gastro-esophageal reflux disease without esophagitis: Secondary | ICD-10-CM | POA: Diagnosis present

## 2013-09-08 DIAGNOSIS — I2789 Other specified pulmonary heart diseases: Secondary | ICD-10-CM | POA: Diagnosis not present

## 2013-09-08 DIAGNOSIS — R0602 Shortness of breath: Secondary | ICD-10-CM

## 2013-09-08 DIAGNOSIS — Z0389 Encounter for observation for other suspected diseases and conditions ruled out: Secondary | ICD-10-CM

## 2013-09-08 DIAGNOSIS — I272 Pulmonary hypertension, unspecified: Secondary | ICD-10-CM

## 2013-09-08 DIAGNOSIS — R011 Cardiac murmur, unspecified: Secondary | ICD-10-CM

## 2013-09-08 DIAGNOSIS — I340 Nonrheumatic mitral (valve) insufficiency: Secondary | ICD-10-CM

## 2013-09-08 LAB — TSH: TSH: 1.2 u[IU]/mL (ref 0.350–4.500)

## 2013-09-08 LAB — COMPREHENSIVE METABOLIC PANEL
ALK PHOS: 78 U/L (ref 39–117)
ALT: 12 U/L (ref 0–35)
AST: 25 U/L (ref 0–37)
Albumin: 3.9 g/dL (ref 3.5–5.2)
BUN: 14 mg/dL (ref 6–23)
CO2: 28 mEq/L (ref 19–32)
CREATININE: 0.68 mg/dL (ref 0.50–1.10)
Calcium: 9.1 mg/dL (ref 8.4–10.5)
Chloride: 98 mEq/L (ref 96–112)
GFR calc non Af Amer: >90 mL/min (ref >90)
GLUCOSE: 113 mg/dL — AB (ref 70–99)
POTASSIUM: 3.9 meq/L (ref 3.7–5.3)
Sodium: 139 mEq/L (ref 137–147)
TOTAL PROTEIN: 7.6 g/dL (ref 6.0–8.3)
Total Bilirubin: 1.8 mg/dL — ABNORMAL HIGH (ref 0.3–1.2)

## 2013-09-08 LAB — CBC WITH DIFFERENTIAL/PLATELET
Basophils Absolute: 0.2 10*3/uL — ABNORMAL HIGH (ref 0.0–0.1)
Basophils Relative: 2 % — ABNORMAL HIGH (ref 0–1)
EOS ABS: 0.2 10*3/uL (ref 0.0–0.7)
Eosinophils Relative: 2 % (ref 0–5)
HCT: 27.2 % — ABNORMAL LOW (ref 36.0–46.0)
Hemoglobin: 8.7 g/dL — ABNORMAL LOW (ref 12.0–15.0)
LYMPHS PCT: 28 % (ref 12–46)
Lymphs Abs: 3.4 10*3/uL (ref 0.7–4.0)
MCH: 36.6 pg — ABNORMAL HIGH (ref 26.0–34.0)
MCHC: 32 g/dL (ref 30.0–36.0)
MCV: 114.3 fL — ABNORMAL HIGH (ref 78.0–100.0)
Monocytes Absolute: 2.6 10*3/uL — ABNORMAL HIGH (ref 0.1–1.0)
Monocytes Relative: 21 % — ABNORMAL HIGH (ref 3–12)
NEUTROS PCT: 47 % (ref 43–77)
Neutro Abs: 5.9 10*3/uL (ref 1.7–7.7)
PLATELETS: 540 10*3/uL — AB (ref 150–400)
RBC: 2.38 MIL/uL — AB (ref 3.87–5.11)
RDW: 14.5 % (ref 11.5–15.5)
WBC: 12.3 10*3/uL — ABNORMAL HIGH (ref 4.0–10.5)

## 2013-09-08 LAB — URINALYSIS, ROUTINE W REFLEX MICROSCOPIC
Bilirubin Urine: NEGATIVE
Glucose, UA: NEGATIVE mg/dL
Hgb urine dipstick: NEGATIVE
KETONES UR: NEGATIVE mg/dL
Leukocytes, UA: NEGATIVE
NITRITE: NEGATIVE
PH: 5 (ref 5.0–8.0)
Protein, ur: NEGATIVE mg/dL
SPECIFIC GRAVITY, URINE: 1.023 (ref 1.005–1.030)
Urobilinogen, UA: 1 mg/dL (ref 0.0–1.0)

## 2013-09-08 LAB — PHOSPHORUS: PHOSPHORUS: 3.1 mg/dL (ref 2.3–4.6)

## 2013-09-08 LAB — INFLUENZA PANEL BY PCR (TYPE A & B)
H1N1 flu by pcr: NOT DETECTED
INFLAPCR: NEGATIVE
INFLBPCR: NEGATIVE

## 2013-09-08 LAB — TROPONIN I

## 2013-09-08 LAB — PRO B NATRIURETIC PEPTIDE: Pro B Natriuretic peptide (BNP): 2425 pg/mL — ABNORMAL HIGH (ref 0–125)

## 2013-09-08 LAB — MAGNESIUM: MAGNESIUM: 2.3 mg/dL (ref 1.5–2.5)

## 2013-09-08 LAB — PROCALCITONIN: Procalcitonin: 0.28 ng/mL

## 2013-09-08 MED ORDER — POTASSIUM CHLORIDE CRYS ER 20 MEQ PO TBCR
40.0000 meq | EXTENDED_RELEASE_TABLET | Freq: Two times a day (BID) | ORAL | Status: DC
  Administered 2013-09-08 – 2013-09-09 (×4): 40 meq via ORAL
  Filled 2013-09-08 (×7): qty 2

## 2013-09-08 MED ORDER — ONDANSETRON HCL 4 MG/2ML IJ SOLN: 4.0000 mg | Freq: Four times a day (QID) | INTRAMUSCULAR | Status: DC | PRN

## 2013-09-08 MED ORDER — DEFERASIROX 500 MG PO TBSO
1500.0000 mg | ORAL_TABLET | Freq: Every day | ORAL | Status: DC
  Administered 2013-09-09 – 2013-09-10 (×2): 1500 mg via ORAL
  Filled 2013-09-08 (×4): qty 3

## 2013-09-08 MED ORDER — ONDANSETRON HCL 4 MG PO TABS: 4.0000 mg | ORAL_TABLET | Freq: Four times a day (QID) | ORAL | Status: DC | PRN

## 2013-09-08 MED ORDER — METHYLPREDNISOLONE SODIUM SUCC 40 MG IJ SOLR
40.0000 mg | Freq: Three times a day (TID) | INTRAMUSCULAR | Status: DC
  Administered 2013-09-08 – 2013-09-09 (×3): 40 mg via INTRAVENOUS
  Filled 2013-09-08 (×6): qty 1

## 2013-09-08 MED ORDER — SENNOSIDES-DOCUSATE SODIUM 8.6-50 MG PO TABS
1.0000 | ORAL_TABLET | Freq: Every evening | ORAL | Status: DC | PRN
  Filled 2013-09-08: qty 1

## 2013-09-08 MED ORDER — ARFORMOTEROL TARTRATE 15 MCG/2ML IN NEBU
15.0000 ug | INHALATION_SOLUTION | Freq: Two times a day (BID) | RESPIRATORY_TRACT | Status: DC
  Administered 2013-09-08 – 2013-09-10 (×5): 15 ug via RESPIRATORY_TRACT
  Filled 2013-09-08 (×10): qty 2

## 2013-09-08 MED ORDER — SODIUM CHLORIDE 0.9 % IJ SOLN: 3.0000 mL | INTRAMUSCULAR | Status: DC | PRN

## 2013-09-08 MED ORDER — PANTOPRAZOLE SODIUM 40 MG PO TBEC
40.0000 mg | DELAYED_RELEASE_TABLET | Freq: Every day | ORAL | Status: DC
  Administered 2013-09-09 – 2013-09-11 (×3): 40 mg via ORAL
  Filled 2013-09-08 (×3): qty 1

## 2013-09-08 MED ORDER — METOPROLOL TARTRATE 50 MG PO TABS
75.0000 mg | ORAL_TABLET | Freq: Two times a day (BID) | ORAL | Status: DC
  Filled 2013-09-08: qty 1

## 2013-09-08 MED ORDER — DILTIAZEM HCL ER COATED BEADS 240 MG PO CP24
240.0000 mg | ORAL_CAPSULE | Freq: Every day | ORAL | Status: DC
Start: 2013-09-08 — End: 2013-09-11
  Administered 2013-09-08 – 2013-09-11 (×4): 240 mg via ORAL
  Filled 2013-09-08 (×5): qty 1

## 2013-09-08 MED ORDER — BUDESONIDE 0.25 MG/2ML IN SUSP
0.2500 mg | Freq: Two times a day (BID) | RESPIRATORY_TRACT | Status: DC
  Administered 2013-09-08 – 2013-09-10 (×5): 0.25 mg via RESPIRATORY_TRACT
  Filled 2013-09-08 (×9): qty 2

## 2013-09-08 MED ORDER — SODIUM CHLORIDE 0.9 % IJ SOLN
3.0000 mL | Freq: Two times a day (BID) | INTRAMUSCULAR | Status: DC
  Administered 2013-09-09: 3 mL via INTRAVENOUS

## 2013-09-08 MED ORDER — GUAIFENESIN ER 600 MG PO TB12
600.0000 mg | ORAL_TABLET | Freq: Two times a day (BID) | ORAL | Status: DC | PRN
  Filled 2013-09-08: qty 1

## 2013-09-08 MED ORDER — LORATADINE 10 MG PO TABS
10.0000 mg | ORAL_TABLET | Freq: Every day | ORAL | Status: DC
  Administered 2013-09-09 – 2013-09-11 (×3): 10 mg via ORAL
  Filled 2013-09-08 (×3): qty 1

## 2013-09-08 MED ORDER — FUROSEMIDE 10 MG/ML IJ SOLN
40.0000 mg | Freq: Once | INTRAMUSCULAR | Status: AC
  Administered 2013-09-08: 40 mg via INTRAVENOUS
  Filled 2013-09-08: qty 4

## 2013-09-08 MED ORDER — POTASSIUM CHLORIDE CRYS ER 20 MEQ PO TBCR: 20.0000 meq | EXTENDED_RELEASE_TABLET | Freq: Two times a day (BID) | ORAL | Status: DC

## 2013-09-08 MED ORDER — FLUTICASONE PROPIONATE 50 MCG/ACT NA SUSP
2.0000 | Freq: Every day | NASAL | Status: DC
  Administered 2013-09-09 – 2013-09-11 (×3): 2 via NASAL
  Filled 2013-09-08: qty 16

## 2013-09-08 MED ORDER — RALOXIFENE HCL 60 MG PO TABS
60.0000 mg | ORAL_TABLET | Freq: Every day | ORAL | Status: DC
  Administered 2013-09-09 – 2013-09-11 (×3): 60 mg via ORAL
  Filled 2013-09-08 (×4): qty 1

## 2013-09-08 MED ORDER — CLONAZEPAM 1 MG PO TABS
1.0000 mg | ORAL_TABLET | Freq: Every evening | ORAL | Status: DC | PRN
  Administered 2013-09-08 – 2013-09-10 (×3): 1 mg via ORAL
  Filled 2013-09-08 (×3): qty 1

## 2013-09-08 MED ORDER — FUROSEMIDE 40 MG PO TABS
40.0000 mg | ORAL_TABLET | Freq: Two times a day (BID) | ORAL | Status: DC
  Filled 2013-09-08 (×2): qty 1

## 2013-09-08 MED ORDER — ACETAMINOPHEN 650 MG RE SUPP: 650.0000 mg | Freq: Four times a day (QID) | RECTAL | Status: DC | PRN

## 2013-09-08 MED ORDER — FUROSEMIDE 10 MG/ML IJ SOLN
40.0000 mg | Freq: Three times a day (TID) | INTRAMUSCULAR | Status: DC
  Administered 2013-09-08 – 2013-09-09 (×4): 40 mg via INTRAVENOUS
  Filled 2013-09-08 (×7): qty 4

## 2013-09-08 MED ORDER — FOLIC ACID 1 MG PO TABS
1.0000 mg | ORAL_TABLET | Freq: Every day | ORAL | Status: DC
  Administered 2013-09-09 – 2013-09-11 (×3): 1 mg via ORAL
  Filled 2013-09-08 (×3): qty 1

## 2013-09-08 MED ORDER — SODIUM CHLORIDE 0.9 % IJ SOLN
3.0000 mL | Freq: Two times a day (BID) | INTRAMUSCULAR | Status: DC
  Administered 2013-09-08 – 2013-09-10 (×5): 3 mL via INTRAVENOUS

## 2013-09-08 MED ORDER — LEVALBUTEROL HCL 0.63 MG/3ML IN NEBU
0.6300 mg | INHALATION_SOLUTION | Freq: Four times a day (QID) | RESPIRATORY_TRACT | Status: DC
  Administered 2013-09-08 – 2013-09-11 (×12): 0.63 mg via RESPIRATORY_TRACT
  Filled 2013-09-08 (×25): qty 3

## 2013-09-08 MED ORDER — ALIGN 4 MG PO CAPS: 4.0000 mg | ORAL_CAPSULE | Freq: Every day | ORAL | Status: DC

## 2013-09-08 MED ORDER — LEVALBUTEROL HCL 0.63 MG/3ML IN NEBU: 0.6300 mg | INHALATION_SOLUTION | RESPIRATORY_TRACT | Status: DC | PRN

## 2013-09-08 MED ORDER — APIXABAN 5 MG PO TABS
5.0000 mg | ORAL_TABLET | Freq: Two times a day (BID) | ORAL | Status: DC
  Administered 2013-09-08 – 2013-09-11 (×6): 5 mg via ORAL
  Filled 2013-09-08 (×7): qty 1

## 2013-09-08 MED ORDER — TRAMADOL HCL 50 MG PO TABS
50.0000 mg | ORAL_TABLET | Freq: Four times a day (QID) | ORAL | Status: DC | PRN
  Administered 2013-09-08 – 2013-09-10 (×6): 50 mg via ORAL
  Filled 2013-09-08 (×7): qty 1

## 2013-09-08 MED ORDER — ACETAMINOPHEN 325 MG PO TABS
650.0000 mg | ORAL_TABLET | Freq: Four times a day (QID) | ORAL | Status: DC | PRN
  Administered 2013-09-08: 650 mg via ORAL
  Filled 2013-09-08: qty 2

## 2013-09-08 MED ORDER — LEVOFLOXACIN IN D5W 500 MG/100ML IV SOLN
500.0000 mg | INTRAVENOUS | Status: DC
  Administered 2013-09-08 – 2013-09-10 (×3): 500 mg via INTRAVENOUS
  Filled 2013-09-08 (×6): qty 100

## 2013-09-08 MED ORDER — SODIUM CHLORIDE 0.9 % IV SOLN: 250.0000 mL | INTRAVENOUS | Status: DC | PRN

## 2013-09-08 NOTE — H&P (Signed)
Name: [name] MRN: [id number] DOB: 1951/04/20    ADMISSION DATE:  (Not on file)  PRIMARY SERVICE:  [facility] PULM   CHIEF COMPLAINT:  Fever and wheezing   BRIEF PATIENT DESCRIPTION: 63 yo WF [place] office pt with Asthma , Pulm HTN , OSA on CPAP/O2 admitted from office 4/14 for Asthma flare , fever and Afib w/ RVR .   SIGNIFICANT EVENTS / STUDIES:  Cardiology consult 4/14   LINES / TUBES:   CULTURES: BC x 2 4/14 >> Viral Panel 4/14 >> Sputum Cx 4/14 >>   ANTIBIOTICS: Levaquin 4/14>>  HISTORY OF PRESENT ILLNESS:   Pt presented to office with persistent cough and fever  X 5 d.  Complains of dry cough x 5 days,temp. 68-  Was seen at [facility] walk in 4/11  Rx Z-pak Says labs throat culture, neg.  Says she is no better w/ sob getting worse,cough now green,temp 101 this am,wheezing,mid chest tightness,wears CPAP with 2L, 2L cont. during day  On arrival to office today , HR up ~120 . Sats 83% on RA . (on chronic O2 -not on on arrival. On 2 llm sats 94%.  Very short of breath with walking.  Will need admission for further evaluation. She has a very complicated medical hx.  She denies any hemoptysis, orthopnea, PND, increased leg swelling, nausea, vomiting, or diarrhea.   Patient has a very complicated history with known pulmonary hypertension, paralyzed right diaphragm, previous mitral valve repair due to severe mitral regurg, recurrent atrial fib status post Maze procedure,Chronic diastolic heart failure and autoimmune hemolytic anemia w/ hemachromatosis (f/by Hematology)   Pulmonary PMH:   Significant restriction on PFTs. > CT chest no ILD  PFT's 2011: FEV1 1.23 (56%), ratio 72, TLC 3.10 (66%), DLCO 33% but corrects to normal with Av.  Fluor 2010: Paralyzed right HD  CT chest 2011: No ISLD or other significant abnl.  [place] 05/2012: Mean PA 36 (58/25), PCWP 19, Fick CO 6.6, calc PVR 2.6 wood units.  OSA >nocturnal oxygen with her cpap  PFT's 2014: FEV1 1.20 (60%), ratio 71, TLC  3.08 (69%), DLCO 36% pred but corrects with Av.   PAST MEDICAL HISTORY :  Past Medical History  Diagnosis Date  . Pyruvate kinase deficiency hemolytic anemia   . Asthma   . Syncope and collapse 2012    r/t anemia  . Anemia   . Dyspnea   . Mitral regurgitation     s/p MV repair complicated by diaphragmatic paralysis  . Fatigue   . Myoclonus   . Heart murmur     severe MR s/p MV repair  . GERD (gastroesophageal reflux disease)   . Allergic rhinitis   . Depression   . Gastritis     NSAID induced  . Asthma   . Hx of asplenia     surgical  . Osteoporosis   . Trochanteric bursitis     left hip  . Pulmonary HTN     mild to moderate by [place] 05/2012  . Exercise hypoxemia     supplemental O2 PRN  . Hypertension   . Atrial fibrillation     s/p RFA and Maze procedure - resolved  . Atrial tachycardia    Past Surgical History  Procedure Laterality Date  . Maze      with MV repair  . Ablation      afib  . Mitral valve repair  01/2010    with MAZE  . Myocardial window  2009  s/p afib ablation   Prior to Admission medications   Medication Sig Start Date End Date Taking? Authorizing Provider  apixaban (ELIQUIS) 5 MG TABS tablet Take 1 tablet (5 mg total) by mouth 2 (two) times daily. 08/26/13   Larey Dresser, MD  budesonide-formoterol [facility]) 160-4.5 MCG/ACT inhaler Inhale 2 puffs into the lungs 2 (two) times daily.    Historical Provider, MD  calcium carbonate (OS-CAL) 600 MG TABS Take 600 mg by mouth 2 (two) times daily with a meal.    Historical Provider, MD  cholecalciferol (VITAMIN D) 1000 UNITS tablet Take 1,000 Units by mouth daily.    Historical Provider, MD  clonazePAM (KLONOPIN) 1 MG tablet Take 1 mg by mouth at bedtime as needed.     Historical Provider, MD  deferasirox (EXJADE) 500 MG disintegrating tablet Take 3 tablets (1,500 mg total) by mouth daily with supper. Take 3 tablets once daily 30 minutes prior to largest meal 12/10/12   Ladell Pier, MD    EPINEPHrine (EPI-PEN) 0.3 mg/0.3 mL DEVI Inject 0.3 mg into the muscle once as needed.    Historical Provider, MD  fexofenadine (ALLEGRA) 180 MG tablet Take 180 mg by mouth daily.    Historical Provider, MD  folic acid (FOLVITE) 1 MG tablet TAKE 1 TABLET DAILY 08/23/11   Ladell Pier, MD  furosemide (LASIX) 40 MG tablet Take 2 tabs (80 mg) in AM and 1 tab (40 mg) in PM 08/26/13   Larey Dresser, MD  guaiFENesin (MUCINEX) 600 MG 12 hr tablet Take by mouth.    Historical Provider, MD  metoprolol (LOPRESSOR) 50 MG tablet Take 75 mg by mouth 2 (two) times daily.     Historical Provider, MD  mometasone (NASONEX) 50 MCG/ACT nasal spray Place 2 sprays into the nose daily.    Historical Provider, MD  Multiple Vitamins-Minerals (AIRBORNE) CHEW Chew 2 tablets by mouth 2 (two) times daily.    Historical Provider, MD  NON FORMULARY Inhale 2 L into the lungs continuous. Oxygen at 2 l/min [place]    Historical Provider, MD  pantoprazole (PROTONIX) 40 MG tablet Take 40 mg by mouth daily.    Historical Provider, MD  potassium chloride SA (K-DUR,KLOR-CON) 20 MEQ tablet Take 2 tablets (40 mEq total) by mouth 2 (two) times daily. 08/26/13   Larey Dresser, MD  Probiotic Product (ALIGN) 4 MG CAPS Take 1 tablet by mouth daily.    Historical Provider, MD  raloxifene (EVISTA) 60 MG tablet Take 60 mg by mouth daily.    Historical Provider, MD  sodium chloride (OCEAN) 0.65 % nasal spray Place 2 sprays into the nose as needed.    Historical Provider, MD  traMADol (ULTRAM) 50 MG tablet Take 50 mg by mouth every 6 (six) hours as needed.    Historical Provider, MD  UNABLE TO FIND Med Name: CPAP    Historical Provider, MD   Allergies  Allergen Reactions  . Iodine   . Iohexol      Code: HIVES, Desc: reaction since childhood, Onset Date: [id number]   . Shellfish Allergy     FAMILY HISTORY:  Family History  Problem Relation Age of Onset  . Breast cancer Mother 50    ER+  . Melanoma Father 31  . Adrenal disorder Sister      pheochromacytoma - rt. adrenal gland  . COPD Maternal Aunt   . Stomach cancer Paternal Uncle     onset in 20s  . Lung cancer Maternal Grandmother  died late 50s, heavy smoker  . Heart disease Maternal Grandfather     hardening of the arteries, poor circulation  . Ovarian cancer Sister 24    stage IV, now 107   SOCIAL HISTORY:  reports that she has never smoked. She does not have any smokeless tobacco history on file. She reports that she drinks alcohol. She reports that she does not use illicit drugs.  REVIEW OF SYSTEMS:   Constitutional: No weight loss, night sweats, Fevers, chills,  +fatigue, or lassitude.  HEENT: No headaches, Difficulty swallowing, Tooth/dental problems, or Sore throat,  No sneezing, itching, ear ache,  +nasal congestion, post nasal drip,  CV: No chest pain, Orthopnea, PND, swelling in lower extremities, anasarca, dizziness,  +palpitations  GI No heartburn, indigestion, abdominal pain, nausea, vomiting, diarrhea, change in bowel habits, loss of appetite, bloody stools.  Resp: . No chest wall deformity  Skin: no rash or lesions.  GU: no dysuria, change in color of urine, no urgency or frequency. No flank pain, no hematuria  MS: No joint pain or swelling. No decreased range of motion. No back pain. Mild body aches  Psych: No change in mood or affect. No depression or anxiety. No memory loss.  SUBJECTIVE:  Breathing /sob is getting worse.   VITAL SIGNS: Temp:  [98.7 F (37.1 C)] 98.7 F (37.1 C) (04/14 1039) Pulse Rate:  [119] 119 (04/14 1039) BP: (128)/(80) 128/80 mmHg (04/14 1039) SpO2:  [93 %] 93 % (04/14 1039) FiO2 (%):  [28 %] 28 % (04/14 1039) Weight:  [166 lb 12.8 oz (75.66 kg)] 166 lb 12.8 oz (75.66 kg) (04/14 1039)  PHYSICAL EXAMINATION: Physical Exam  GEN: A/Ox3; pleasant , NAD, elderly , appears ill  HEENT: [place]/AT, EACs-clear, TMs-wnl, NOSE-clear, THROAT-clear, no lesions, no postnasal drip or exudate noted.  NECK: Supple w/ fair ROM; no  JVD; normal carotid impulses w/o bruits; no thyromegaly or nodules palpated; no lymphadenopathy.  RESP Bibasilar crackles noted, faint exp wheeze no accessory muscle use, no dullness to percussion  CARD: irreg /irreg , tachy no m/r/g , no peripheral edema, pulses intact, no cyanosis or clubbing.  GI: Soft & nt; nml bowel sounds; no organomegaly or masses detected.  Musco: Warm bil, no deformities or joint swelling noted.  Neuro: alert, no focal deficits noted.  Skin: Warm, no lesions or rashes  No results found for this basename: NA, K, CL, CO2, BUN, CREATININE, GLUCOSE,  in the last 168 hours No results found for this basename: HGB, HCT, WBC, PLT,  in the last 168 hours No results found.  ASSESSMENT / PLAN:  1. Acute Asthma Exacerbation w/ possible viral illness ?superimposed PNA -OP failure on zpack .   Plan  Check CXR  Begin empiric ABX w/ Levaquin  Begin Solumedrol 40mg  IV every 8hr   Begin Pulmicort/Brovana  Neb (Symbicort on hold )  Xopenex Neb every 6hr and As needed  Every 2hrs  Check influenza panel, PCT , BC and sputum    2. Atrial Fib with RVR w/ acute illness and fever.  Chronic Diastolic Heart Failure -appear euvolemic   Plan  Continue on current regimen w/ metoprolol  Cards consult  Cont Eliquis  Check EKG, TSH, BNP, troponin x 1  Cont Lasix -lower dose until labs back and to avoid dehydratioin.  3. OSA on Nocturnal CPAP /O2   Plan  Cont CPAP /O2 At bedtime    4. Pulmonary HTN   Plan  Continue on O2 -keep sat >90%   5. 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 Benay Spice on Exjade    Plan  Cont current regimen w/ Exjade  Check cbc   6. GERD   Plan  PPI     Melvenia Needles NP-C  Pulmonary and Critical Care Medicine [facility] Pager: [phone]  09/08/2013, 11:25 AM

## 2013-09-08 NOTE — H&P (Signed)
Pt seen and examined in the office.  Agree with details outlined by NP Parrett, as well as the recommendations.

## 2013-09-08 NOTE — Progress Notes (Signed)
UR Completed Magda Muise Graves-Bigelow, RN,BSN [phone]  

## 2013-09-08 NOTE — Progress Notes (Signed)
PHARMACIST - PHYSICIAN ORDER COMMUNICATION  CONCERNING: P&T Medication Policy on Herbal Medications  DESCRIPTION:  This patient's order for:  Align has been noted.  This product(s) is classified as an "herbal" or natural product. Due to a lack of definitive safety studies or FDA approval, nonstandard manufacturing practices, plus the potential risk of unknown drug-drug interactions while on inpatient medications, the Pharmacy and Therapeutics Committee does not permit the use of "herbal" or natural products of this type within [facility]   ACTION TAKEN: The pharmacy department is unable to verify this order at this time and your patient has been informed of this safety policy. Please reevaluate patient's clinical condition at discharge and address if the herbal or natural product(s) should be resumed at that time.  Sloan Leiter, PharmD, BCPS Clinical Pharmacist [phone] 09/08/2013, 12:03 PM

## 2013-09-08 NOTE — Telephone Encounter (Signed)
Wanted MD aware she was admitted to [facility]- 3W14-C today for pneumonia and "heart issue".

## 2013-09-08 NOTE — Assessment & Plan Note (Signed)
Acute asthmatic exacerbation with OP tx failure complicated by tachycardia -probable atrial Fib with RVR .  Pt will need admission - see h/p note.

## 2013-09-08 NOTE — Progress Notes (Signed)
   Subjective:    Patient ID: [name], female    DOB: December 21, 1950, 63 y.o.   MRN: [id number]  HPI 63 yo WF with known hx of Asthma , OSA on CPAP /O2 and Pulmonary HTN Paralyzed right hemidiaphragm,  Significant restriction on PFTs.  > CT chest no ILD  PFT's 2011:  FEV1 1.23 (56%), ratio 72, TLC 3.10 (66%), DLCO 33% but corrects to normal with Av.  [place] 2010:  Paralyzed right HD CT chest 2011:  No ISLD or other significant abnl.  [place] 05/2012:  Mean PA 36 (58/25), PCWP 19, Fick CO 6.6, calc PVR 2.6 wood units. OSA >nocturnal oxygen with her cpap PFT's 2014:  FEV1 1.20 (60%), ratio 71, TLC 3.08 (69%), DLCO 36% pred but corrects with Av.   09/08/2013 Acute OV  Complains of dry cough x 5 days,temp. 71- Was seen at [facility] walk in 4/11  Rx Z-pak  Says labs throat culture, neg.  Says she is no better w/ sob getting worse,cough now green,temp 101 this am,wheezing,mid chest tightness,wears CPAP with 2L, 2L cont. during day On arrival to office today , HR up ~120 .  Very short of breath with walking.  Will need admission for further evaluation.  She denies any hemoptysis, orthopnea, PND, increased leg swelling, nausea, vomiting, or diarrhea. 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    Review of Systems Constitutional:   No  weight loss, night sweats,  Fevers, chills,  +fatigue, or  lassitude.  HEENT:   No headaches,  Difficulty swallowing,  Tooth/dental problems, or  Sore throat,                No sneezing, itching, ear ache,  +nasal congestion, post nasal drip,   CV:  No chest pain,  Orthopnea, PND, swelling in lower extremities, anasarca, dizziness,  +palpitations  GI  No heartburn, indigestion, abdominal pain, nausea, vomiting, diarrhea, change in  bowel habits, loss of appetite, bloody stools.   Resp:  .  No chest wall deformity  Skin: no rash or lesions.  GU: no dysuria, change in color of urine, no urgency or frequency.  No flank pain, no hematuria   MS:  No joint pain or swelling.  No decreased range of motion.  No back pain.  Psych:  No change in mood or affect. No depression or anxiety.  No memory loss.         Objective:   Physical Exam GEN: A/Ox3; pleasant , NAD, elderly , appears ill    HEENT:  [place]/AT,  EACs-clear, TMs-wnl, NOSE-clear, THROAT-clear, no lesions, no postnasal drip or exudate noted.   NECK:  Supple w/ fair ROM; no JVD; normal carotid impulses w/o bruits; no thyromegaly or nodules palpated; no lymphadenopathy.  RESP  Bibasilar crackles noted, faint exp wheeze no accessory muscle use, no dullness to percussion  CARD:  irreg /irreg , tachy  no m/r/g  , no peripheral edema, pulses intact, no cyanosis or clubbing.  GI:   Soft & nt; nml bowel sounds; no organomegaly or masses detected.  Musco: Warm bil, no deformities or joint swelling noted.   Neuro: alert, no focal deficits noted.    Skin: Warm, no lesions or rashes         Assessment & Plan:

## 2013-09-08 NOTE — Progress Notes (Signed)
RT setup the CPAP for the patient with a ramp time of .05 and on auto of 18 and 5 with 2 L of O2 bled in through tubing.  Patient brought her home nasal pillows from home. RT will continue to monitor.

## 2013-09-08 NOTE — Consult Note (Signed)
Reason for Consult: AF with RVR  Requesting Physician: Dr Gwenette Greet  HPI: This is a 63 y.o. female with a past medical history significant for PAF, COPD with an asthmatic component on chronic O2, Pulm HTN, and MR. She is followed by Dr Jacinto Reap. She had MV repair and MAZE in 2011. 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 She has a history of PAF but did well after her MAZE till about a month ago when she was noted to be in AF. She says she "just didn't feel right", not particularly more dyspneic than usual. She had seen Dr Aundra Dubin in the office 08/26/13 and she was started on Eliquis and her Lasix was increased as he felt the pt was volume overloaded then.     This past Thursday she noted low grade fever to 100 with sinus drainage and dry cough. Friday her temp went to 102 and she went to an urgent care. She was placed on a Z-pack with some improvement but her symptoms got worse yesterday with fever to 102, increased dyspnea, and hypoxia on her home O2 monitor. She was seen by Dr Gwenette Greet today in the office and admitted for COPD/ asthma exacerbation. Reviewing her home medications, she had ben on Lopressor 75 mg BID- her last dose was an hour ago and her HR is now close to 100.  PMHx:  Past Medical History  Diagnosis Date  . Pyruvate kinase deficiency hemolytic anemia   . Asthma   . Syncope and collapse 2012    r/t anemia  . Anemia   . Dyspnea   . Mitral regurgitation     s/p MV repair complicated by diaphragmatic paralysis  . Fatigue   . Myoclonus   . Heart murmur     severe MR s/p MV repair  . GERD (gastroesophageal reflux disease)   . Allergic rhinitis   . Depression   . Gastritis     NSAID induced  . Asthma   . Hx of asplenia     surgical  . Osteoporosis   . Trochanteric bursitis     left hip  . Pulmonary HTN     mild to moderate by [place] 05/2012  . Exercise hypoxemia     supplemental O2 PRN  . Hypertension   . Atrial fibrillation     s/p  RFA and Maze procedure - resolved  . Atrial tachycardia    Past Surgical History  Procedure Laterality Date  . Maze      with MV repair  . Ablation      afib  . Mitral valve repair  01/2010    with MAZE  . Myocardial window  2009    s/p afib ablation    FAMHx: Breast cancer -Mother   SOCHx:  reports that she has never smoked. She has never used smokeless tobacco. She reports that she drinks alcohol. She reports that she does not use illicit drugs.  ALLERGIES: Allergies  Allergen Reactions  . Iodine   . Iohexol      Code: HIVES, Desc: reaction since childhood, Onset Date: 96789381   . Shellfish Allergy     ROS: Pertinent items are noted in HPI. she denies any increased LE edema. She has not had a transfusion for > 2years.   HOME MEDICATIONS: Prescriptions prior to admission  Medication Sig Dispense Refill  . apixaban (ELIQUIS) 5 MG TABS tablet Take 1 tablet (5 mg total) by mouth 2 (two) times daily.  [place]  tablet  6  . budesonide-formoterol (SYMBICORT) 160-4.5 MCG/ACT inhaler Inhale 2 puffs into the lungs 2 (two) times daily.      . calcium carbonate (OS-CAL) 600 MG TABS Take 600 mg by mouth 2 (two) times daily with a meal.      . cholecalciferol (VITAMIN D) 1000 UNITS tablet Take 1,000 Units by mouth daily.      . clonazePAM (KLONOPIN) 1 MG tablet Take 1 mg by mouth at bedtime as needed.       . deferasirox (EXJADE) 500 MG disintegrating tablet Take 3 tablets (1,500 mg total) by mouth daily with supper. Take 3 tablets once daily 30 minutes prior to largest meal  90 tablet  11  . EPINEPHrine (EPI-PEN) 0.3 mg/0.3 mL DEVI Inject 0.3 mg into the muscle once as needed.      . fexofenadine (ALLEGRA) 180 MG tablet Take 180 mg by mouth daily.      . folic acid (FOLVITE) 1 MG tablet TAKE 1 TABLET DAILY  90 tablet  4  . furosemide (LASIX) 40 MG tablet Take 2 tabs (80 mg) in AM and 1 tab (40 mg) in PM  90 tablet  3  . guaiFENesin (MUCINEX) 600 MG 12 hr tablet Take by mouth.      .  metoprolol (LOPRESSOR) 50 MG tablet Take 75 mg by mouth 2 (two) times daily.       . mometasone (NASONEX) 50 MCG/ACT nasal spray Place 2 sprays into the nose daily.      . Multiple Vitamins-Minerals (AIRBORNE) CHEW Chew 2 tablets by mouth 2 (two) times daily.      . NON FORMULARY Inhale 2 L into the lungs continuous. Oxygen at 2 l/min [place]      . pantoprazole (PROTONIX) 40 MG tablet Take 40 mg by mouth daily.      . potassium chloride SA (K-DUR,KLOR-CON) 20 MEQ tablet Take 2 tablets (40 mEq total) by mouth 2 (two) times daily.  120 tablet  3  . Probiotic Product (ALIGN) 4 MG CAPS Take 1 tablet by mouth daily.      . raloxifene (EVISTA) 60 MG tablet Take 60 mg by mouth daily.      . sodium chloride (OCEAN) 0.65 % nasal spray Place 2 sprays into the nose as needed.      . traMADol (ULTRAM) 50 MG tablet Take 50 mg by mouth every 6 (six) hours as needed.      Marland Kitchen UNABLE TO FIND Med Name: [place]: I have reviewed the patient's current medications.  VITALS: Blood pressure 133/50, pulse 122, temperature 98.1 F (36.7 C), temperature source Oral, resp. rate 18, height 5' 2.5" (1.588 m), weight 167 lb 5.3 oz (75.9 kg), SpO2 94.00%.  PHYSICAL EXAM: General appearance: alert, cooperative, no distress and mildly obese Neck: no carotid bruit, JVP 12 cm Lungs: insp/exp wheezing bilat, decreased breath sounds Rt base Heart: irregularly irregular rhythm Abdomen: obese, RUQ surg scar, splenectomy scar Extremities: trace edema Pulses: 2+ and symmetric Skin: cool and dry Neurologic: Grossly normal  LABS: Results for orders placed during the hospital encounter of 09/08/13 (from the past 48 hour(s))  COMPREHENSIVE METABOLIC PANEL     Status: Abnormal   Collection Time    09/08/13 12:50 PM      Result Value Ref Range   Sodium 139  137 - 147 mEq/L   Potassium 3.9  3.7 - 5.3 mEq/L   Chloride 98  96 -  112 mEq/L   CO2 28  19 - 32 mEq/L   Glucose, Bld 113 (*) 70 - 99 mg/dL   BUN  14  6 - 23 mg/dL   Creatinine, Ser 0.68  0.50 - 1.10 mg/dL   Calcium 9.1  8.4 - 10.5 mg/dL   Total Protein 7.6  6.0 - 8.3 g/dL   Albumin 3.9  3.5 - 5.2 g/dL   AST 25  0 - 37 U/L   ALT 12  0 - 35 U/L   Alkaline Phosphatase 78  39 - 117 U/L   Total Bilirubin 1.8 (*) 0.3 - 1.2 mg/dL   GFR calc non Af Amer >90  >90 mL/min   GFR calc Af Amer >90  >90 mL/min   Comment: (NOTE)     The eGFR has been calculated using the CKD EPI equation.     This calculation has not been validated in all clinical situations.     eGFR's persistently <90 mL/min signify possible Chronic Kidney     Disease.  MAGNESIUM     Status: None   Collection Time    09/08/13 12:50 PM      Result Value Ref Range   Magnesium 2.3  1.5 - 2.5 mg/dL  PHOSPHORUS     Status: None   Collection Time    09/08/13 12:50 PM      Result Value Ref Range   Phosphorus 3.1  2.3 - 4.6 mg/dL  CBC WITH DIFFERENTIAL     Status: Abnormal   Collection Time    09/08/13 12:50 PM      Result Value Ref Range   WBC 12.3 (*) 4.0 - 10.5 K/uL   RBC 2.38 (*) 3.87 - 5.11 MIL/uL   Hemoglobin 8.7 (*) 12.0 - 15.0 g/dL   HCT 27.2 (*) 36.0 - 46.0 %   MCV 114.3 (*) 78.0 - 100.0 fL   MCH 36.6 (*) 26.0 - 34.0 pg   MCHC 32.0  30.0 - 36.0 g/dL   RDW 14.5  11.5 - 15.5 %   Platelets 540 (*) 150 - 400 K/uL   Neutrophils Relative % 47  43 - 77 %   Lymphocytes Relative 28  12 - 46 %   Monocytes Relative 21 (*) 3 - 12 %   Eosinophils Relative 2  0 - 5 %   Basophils Relative 2 (*) 0 - 1 %   Neutro Abs 5.9  1.7 - 7.7 K/uL   Lymphs Abs 3.4  0.7 - 4.0 K/uL   Monocytes Absolute 2.6 (*) 0.1 - 1.0 K/uL   Eosinophils Absolute 0.2  0.0 - 0.7 K/uL   Basophils Absolute 0.2 (*) 0.0 - 0.1 K/uL   RBC Morphology MARKED POLYCHROMASIA     Comment: ACANTHOCYTES     HOWELL/JOLLY BODIES   WBC Morphology MILD LEFT SHIFT (1-5% METAS, OCC MYELO, OCC BANDS)     Comment: ATYPICAL LYMPHOCYTES  TSH     Status: None   Collection Time    09/08/13 12:50 PM      Result Value Ref  Range   TSH 1.200  0.350 - 4.500 uIU/mL   Comment: Please note change in reference range.  PRO B NATRIURETIC PEPTIDE     Status: Abnormal   Collection Time    09/08/13 12:50 PM      Result Value Ref Range   Pro B Natriuretic peptide (BNP) 2425.0 (*) 0 - 125 pg/mL  TROPONIN I     Status: None   Collection Time  09/08/13 12:50 PM      Result Value Ref Range   Troponin I <0.30  <0.30 ng/mL   Comment:            Due to the release kinetics of cTnI,     a negative result within the first hours     of the onset of symptoms does not rule out     myocardial infarction with certainty.     If myocardial infarction is still suspected,     repeat the test at appropriate intervals.  PROCALCITONIN     Status: None   Collection Time    09/08/13 12:50 PM      Result Value Ref Range   Procalcitonin 0.28     Comment:            Interpretation:     PCT (Procalcitonin) <= 0.5 ng/mL:     Systemic infection (sepsis) is not likely.     Local bacterial infection is possible.     (NOTE)             ICU PCT Algorithm               Non ICU PCT Algorithm        ----------------------------     ------------------------------             PCT < 0.25 ng/mL                 PCT < 0.1 ng/mL         Stopping of antibiotics            Stopping of antibiotics           strongly encouraged.               strongly encouraged.        ----------------------------     ------------------------------           PCT level decrease by               PCT < 0.25 ng/mL           >= 80% from peak PCT           OR PCT 0.25 - 0.5 ng/mL          Stopping of antibiotics                                                 encouraged.         Stopping of antibiotics               encouraged.        ----------------------------     ------------------------------           PCT level decrease by              PCT >= 0.25 ng/mL           < 80% from peak PCT            AND PCT >= 0.5 ng/mL            Continuing antibiotics  encouraged.           Continuing antibiotics                encouraged.        ----------------------------     ------------------------------         PCT level increase compared          PCT > 0.5 ng/mL             with peak PCT AND              PCT >= 0.5 ng/mL             Escalation of antibiotics                                              strongly encouraged.          Escalation of antibiotics            strongly encouraged.    EKG: AF with RVR  IMAGING: No results found.  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    Normal coronary arteries- 2011   RECOMMENDATION: MD to see. Suspect she has some mixed acute diastolic CHF from her AF as well as a URI/COPD exacerbation. Would give home dose Lasix IV for a couple of days. Lopressor not ordered on admission secondary to wheezing but we may not want to stop this altogether (she was on 75 mg BID prior to admission), consider 12.5 mg BID in am. Add Diltiazem for rate control. Consider TEE CV before discharge.   Time Spent Directly with Patient: 43 minutes  [name] [phone] beeper 09/08/2013, 3:00 PM   Patient seen with PA, agree with the above note.  She has a complicated history with PAH, persistent atrial fibrillation, diastolic CHF, OSA, and asthma with chronic respiratory failure.  She presented with febrile illness and wheezing but is also volume overloaded on exam.  1. Asthma exacerbation/acute bronchitis: Patient presented febrile to 102 with wheezing. I suspect she has an asthma exacerbation in the setting of acute bronchitis. At baseline, she has OSA and asthma with  chronic respiratory failure on home oxygen, ? OHS.   - nebs/steroids/antibiotics per primary service.  2. Acute on chronic diastolic CHF with moderate RV systolic dysfunction and pulmonary hypertension.  Patient is volume overloaded on exam.  She was volume overloaded when I last saw her in the office prior to this acute febrile illness with wheezing.   - Echocardiogram - Diurese with Lasix 40 mg IV every 8 hrs.  Strict I/Os and daily weights.  3. Pulmonary arterial hypertension: PVR was only 3.1 WU on last RHC. The [place] may be primarily Who Group 3 in the setting of OSA, asthma, and chronic respiratory failure (on home oxygen, ?OHS).  - Continue CPAP at night and oxygen during the day.  - If symptoms don't improve with DCCV, would consider repeat RHC.  4. Atrial fibrillation: Patient noted to have recurrent atrial fibrillation at last clinic appointment.  I suspect the onset of  atrial fibrillation has helped to trigger her CHF exacerbation.  She had been started on Eliquis with plan for eventual DCCV.  - Continue Eliquis.  - Stop metoprolol given wheezing on exam.  I will start her on diltiazem CD 240 mg daily.  - When her respiratory status is improved, would consider TEE-guided DCCV this admission.   Larey Dresser 09/08/2013 3:45 PM

## 2013-09-08 NOTE — Care Management Note (Unsigned)
    Page 1 of 1   09/08/2013     4:15:26 PM   CARE MANAGEMENT NOTE 09/08/2013  Patient:  [name], [name]   Account Number:  [id number]  Date Initiated:  09/08/2013  Documentation initiated by:  GRAVES-BIGELOW,Kiyona Mcnall  Subjective/Objective Assessment:   Pt admitted from Office with Asthma exacerbation.     Action/Plan:   CM will contnje to monitor for disposition needs.   Anticipated DC Date:  09/10/2013   Anticipated DC Plan:  [place]  CM consult      Choice offered to / List presented to:             Status of service:  In process, will continue to follow Medicare Important Message given?   (If response is "NO", the following Medicare IM given date fields will be blank) Date Medicare IM given:   Date Additional Medicare IM given:    Discharge Disposition:    Per UR Regulation:  Reviewed for med. necessity/level of care/duration of stay  If discussed at [place] of Stay Meetings, dates discussed:    Comments:

## 2013-09-09 DIAGNOSIS — I369 Nonrheumatic tricuspid valve disorder, unspecified: Secondary | ICD-10-CM

## 2013-09-09 DIAGNOSIS — J45909 Unspecified asthma, uncomplicated: Secondary | ICD-10-CM

## 2013-09-09 DIAGNOSIS — I5033 Acute on chronic diastolic (congestive) heart failure: Secondary | ICD-10-CM

## 2013-09-09 LAB — EXPECTORATED SPUTUM ASSESSMENT W GRAM STAIN, RFLX TO RESP C

## 2013-09-09 LAB — BASIC METABOLIC PANEL
BUN: 17 mg/dL (ref 6–23)
CHLORIDE: 98 meq/L (ref 96–112)
CO2: 29 mEq/L (ref 19–32)
Calcium: 9.6 mg/dL (ref 8.4–10.5)
Creatinine, Ser: 0.72 mg/dL (ref 0.50–1.10)
GFR calc non Af Amer: 89 mL/min — ABNORMAL LOW (ref >90)
Glucose, Bld: 170 mg/dL — ABNORMAL HIGH (ref 70–99)
POTASSIUM: 4.5 meq/L (ref 3.7–5.3)
Sodium: 140 mEq/L (ref 137–147)

## 2013-09-09 LAB — CBC
HEMATOCRIT: 27.2 % — AB (ref 36.0–46.0)
HEMOGLOBIN: 8.7 g/dL — AB (ref 12.0–15.0)
MCH: 36.6 pg — ABNORMAL HIGH (ref 26.0–34.0)
MCHC: 32 g/dL (ref 30.0–36.0)
MCV: 114.3 fL — ABNORMAL HIGH (ref 78.0–100.0)
Platelets: 541 10*3/uL — ABNORMAL HIGH (ref 150–400)
RBC: 2.38 MIL/uL — ABNORMAL LOW (ref 3.87–5.11)
RDW: 14.5 % (ref 11.5–15.5)
WBC: 7.5 10*3/uL (ref 4.0–10.5)

## 2013-09-09 LAB — GLUCOSE, CAPILLARY
GLUCOSE-CAPILLARY: 350 mg/dL — AB (ref 70–99)
Glucose-Capillary: 126 mg/dL — ABNORMAL HIGH (ref 70–99)
Glucose-Capillary: 176 mg/dL — ABNORMAL HIGH (ref 70–99)

## 2013-09-09 MED ORDER — METHYLPREDNISOLONE SODIUM SUCC 40 MG IJ SOLR
40.0000 mg | Freq: Two times a day (BID) | INTRAMUSCULAR | Status: AC
  Administered 2013-09-09 – 2013-09-10 (×3): 40 mg via INTRAVENOUS
  Filled 2013-09-09 (×4): qty 1

## 2013-09-09 MED ORDER — BISOPROLOL FUMARATE 5 MG PO TABS
5.0000 mg | ORAL_TABLET | Freq: Two times a day (BID) | ORAL | Status: DC
  Administered 2013-09-09 – 2013-09-11 (×5): 5 mg via ORAL
  Filled 2013-09-09 (×6): qty 1

## 2013-09-09 MED ORDER — INSULIN ASPART 100 UNIT/ML ~~LOC~~ SOLN
0.0000 [IU] | SUBCUTANEOUS | Status: DC
  Administered 2013-09-09: 3 [IU] via SUBCUTANEOUS
  Administered 2013-09-09: 2 [IU] via SUBCUTANEOUS
  Administered 2013-09-09: 11 [IU] via SUBCUTANEOUS
  Administered 2013-09-10: 2 [IU] via SUBCUTANEOUS
  Administered 2013-09-10: 3 [IU] via SUBCUTANEOUS
  Administered 2013-09-10 – 2013-09-11 (×6): 2 [IU] via SUBCUTANEOUS
  Administered 2013-09-11: 3 [IU] via SUBCUTANEOUS

## 2013-09-09 MED ORDER — BISOPROLOL FUMARATE 5 MG PO TABS
5.0000 mg | ORAL_TABLET | Freq: Every day | ORAL | Status: DC
  Filled 2013-09-09: qty 1

## 2013-09-09 NOTE — Progress Notes (Signed)
  Echocardiogram 2D Echocardiogram has been performed.  Basilia Jumbo 09/09/2013, 11:17 AM

## 2013-09-09 NOTE — Progress Notes (Signed)
Spoke with patient about CPAP. Pt states she can place herself on and off. I told her to let someone know to notify me if she needs any assistance.

## 2013-09-09 NOTE — Progress Notes (Addendum)
Name: [name] MRN: [id number] DOB: 1950/12/13    ADMISSION DATE:  09/08/2013  PRIMARY SERVICE:  [facility]   CHIEF COMPLAINT:  Fever and wheezing   BRIEF PATIENT DESCRIPTION: 63 yo [facility] [place] office pt with Asthma , Pulm HTN , OSA on CPAP/O2 admitted from office 4/14 for Asthma flare , fever and Afib w/ RVR .   SIGNIFICANT EVENTS / STUDIES:  Cardiology consult 4/14   LINES / TUBES:   CULTURES: BC x 2 4/14 >> Viral Panel 4/14 >> Sputum Cx 4/14 >>   ANTIBIOTICS: Levaquin 4/14>>  HISTORY OF PRESENT ILLNESS:   Pt presented to office with persistent cough and fever  X 5 d.  Complains of dry cough x 5 days,temp. 45-  Was seen at [facility] walk in 4/11  Rx Z-pak Says labs throat culture, neg.  Says she is no better w/ sob getting worse,cough now green,temp 101 this am,wheezing,mid chest tightness,wears CPAP with 2L, 2L cont. during day  On arrival to office today , HR up ~120 . Sats 83% on RA . (on chronic O2 -not on on arrival. On 2 llm sats 94%.  Very short of breath with walking.  Will need admission for further evaluation. She has a very complicated medical hx.  She denies any hemoptysis, orthopnea, PND, increased leg swelling, nausea, vomiting, or diarrhea.   Patient has a very complicated history with known pulmonary hypertension, paralyzed right diaphragm, previous mitral valve repair due to severe mitral regurg, recurrent atrial fib status post Maze procedure,Chronic diastolic heart failure and autoimmune hemolytic anemia w/ hemachromatosis (f/by Hematology)   Pulmonary PMH:   Significant restriction on PFTs. > CT chest no ILD  PFT's 2011: FEV1 1.23 (56%), ratio 72, TLC 3.10 (66%), DLCO 33% but corrects to normal with Av.  [place] 2010: Paralyzed right HD  CT chest 2011: No ISLD or other significant abnl.  [place] 05/2012: Mean PA 36 (58/25), PCWP 19, Fick CO 6.6, calc PVR 2.6 wood units.  OSA >nocturnal oxygen with her cpap  PFT's 2014: FEV1 1.20 (60%), ratio 71, TLC 3.08  (69%), DLCO 36% pred but corrects with Av.    SUBJECTIVE:  Breathing is better.  VITAL SIGNS: Temp:  [97.3 F (36.3 C)-99.9 F (37.7 C)] 97.3 F (36.3 C) (04/15 0426) Pulse Rate:  [92-122] 109 (04/15 0426) Resp:  [18-20] 20 (04/15 0426) BP: (107-145)/(50-80) 107/65 mmHg (04/15 0426) SpO2:  [92 %-96 %] 92 % (04/15 0426) FiO2 (%):  [28 %] 28 % (04/14 1039) Weight:  [75.2 kg (165 lb 12.6 oz)-75.9 kg (167 lb 5.3 oz)] 75.2 kg (165 lb 12.6 oz) (04/15 0426)  PHYSICAL EXAMINATION: Physical Exam  GEN: A/Ox3; pleasant , NAD, elderly , appears better. HEENT: [place]/AT, EACs-clear, TMs-wnl, NOSE-clear, THROAT-clear, no lesions, no postnasal drip or exudate noted.  NECK: Supple w/ fair ROM; no JVD; normal carotid impulses w/o bruits; no thyromegaly or nodules palpated; no lymphadenopathy.  RESP Bibasilar crackles noted, faint exp wheeze no accessory muscle use, no dullness to percussion  CARD: hsr with frequent ir , ++murmur, no peripheral edema, pulses intact, no cyanosis or clubbing.  GI: Soft & nt; nml bowel sounds; no organomegaly or masses detected.  Musco: Warm bil, no deformities or joint swelling noted.  Neuro: alert, no focal deficits noted.  Skin: Warm, no lesions or rashes   Recent Labs Lab 09/08/13 1250 09/09/13 0536  NA 139 140  K 3.9 4.5  CL 98 98  CO2 28 29  BUN 14 17  CREATININE 0.68 0.72  GLUCOSE 113* 170*    Recent Labs Lab 09/08/13 1250 09/09/13 0536  HGB 8.7* 8.7*  HCT 27.2* 27.2*  WBC 12.3* 7.5  PLT 540* 541*   X-ray Chest Pa And Lateral   09/08/2013   CLINICAL DATA:  Cough, fever and shortness of breath.  EXAM: CHEST  2 VIEW  COMPARISON:  09/05/2013 and 05/03/2010  FINDINGS: The lungs are adequately inflated without focal consolidation or effusion. There is mild prominence of the perihilar markings suggesting mild vascular congestion. There is stable borderline cardiomegaly. Evidence of prior mitral valve surgery unchanged. There are mild degenerative changes  of the spine.  IMPRESSION: Findings suggesting mild vascular congestion.   Electronically Signed   By: Marin Olp M.D.   On: 09/08/2013 15:28    ASSESSMENT / PLAN:  1. Acute Asthma Exacerbation w/ possible viral bronchitis ?soubt superimposed PNA -OP failure on zpak .  OSA Plan   Begin empiric ABX w/ Levaquin  Decrease Solumedrol 40mg  IV every 12hr   Pulmicort/Brovana  Neb in hospital (Symbicort on hold )  Xopenex Neb every 6hr and As needed  Every 2hrs  Check influenza panel(neg), PCT(0.28) , BC and sputum  Noct CPAP   2. Atrial Fib with RVR w/ acute illness and fever.  Chronic Diastolic Heart Failure -appear euvolemic  Pulmonary hypertension - secondary  Plan  Continue on current regimen w/ metoprolol  Cards consult  Cont Eliquis  Check EKG, TSH, BNP, troponin x 1  Cont Lasix per cards ? DCCV planned per cards  3. OSA on Nocturnal CPAP /O2   Plan  Cont CPAP /O2 At bedtime    4. Pulmonary HTN   Plan  Continue on O2 -keep sat >90%   5. . 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 Benay Spice on [place] current regimen w/ Exjade  Check cbc   6. GERD   Plan  PPI    7. SIDM follow cbg SSI  Richardson Landry Minor ACNP Maryanna Shape PCCM Pager [phone] till 3 pm If no answer page [phone]  Independently examined pt, evaluated data & formulated above care plan with NP who scribed this note & edited by me. Can taper steroids as bronchospasm resolves, Leavquin x 7ds total Will needs diuresis & rhythm control per cards  Rigoberto Noel MD  09/09/2013, 8:10 AM

## 2013-09-09 NOTE — Discharge Instructions (Signed)
Information on my medicine - ELIQUIS (apixaban)  This medication education was reviewed with me or my healthcare representative as part of my discharge preparation.  The pharmacist that spoke with me during my hospital stay was:  Deboraha Sprang, [facility]  Why was Eliquis prescribed for you? Eliquis was prescribed for you to reduce the risk of a blood clot forming that can cause a stroke if you have a medical condition called atrial fibrillation (a type of irregular heartbeat).  What do You need to know about Eliquis ? Take your Eliquis TWICE DAILY - one tablet in the morning and one tablet in the evening with or without food. If you have difficulty swallowing the tablet whole please discuss with your pharmacist how to take the medication safely.  Take Eliquis exactly as prescribed by your doctor and DO NOT stop taking Eliquis without talking to the doctor who prescribed the medication.  Stopping may increase your risk of developing a stroke.  Refill your prescription before you run out.  After discharge, you should have regular check-up appointments with your healthcare provider that is prescribing your Eliquis.  In the future your dose may need to be changed if your kidney function or weight changes by a significant amount or as you get older.  What do you do if you miss a dose? If you miss a dose, take it as soon as you remember on the same day and resume taking twice daily.  Do not take more than one dose of ELIQUIS at the same time to make up a missed dose.  Important Safety Information A possible side effect of Eliquis is bleeding. You should call your healthcare provider right away if you experience any of the following:   Bleeding from an injury or your nose that does not stop.   Unusual colored urine (red or dark brown) or unusual colored stools (red or black).   Unusual bruising for unknown reasons.   A serious fall or if you hit your head (even if there is no  bleeding).  Some medicines may interact with Eliquis and might increase your risk of bleeding or clotting while on Eliquis. To help avoid this, consult your healthcare provider or pharmacist prior to using any new prescription or non-prescription medications, including herbals, vitamins, non-steroidal anti-inflammatory drugs (NSAIDs) and supplements.  This website has more information on Eliquis (apixaban): [email or web address].

## 2013-09-09 NOTE — Progress Notes (Signed)
Advanced Heart Failure Rounding Note  Reason for Consult:  AF with RVR  PCP: Dr. Mertha Finders Pulmonologist: Dr. Gwenette Greet  Subjective:    This is a 63 y.o. female with a past medical history significant for PAF, COPD with an asthmatic component on chronic O2, Pulm HTN, and MR. She had MV repair and MAZE in 2011. 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 She has a history of PAF but did well after her MAZE till about a month ago when she was noted to be in AF. Seen Dr Aundra Dubin in the office 08/26/13 and she was started on Eliquis and her Lasix was increased as he felt the pt was volume overloaded then.  Past week low grade fever and started on abx. Increased dyspnea and hypoxia on home O2. Admitted for COPD/asthma exacerbation along with volume overload. ECHO pending. Started on IV lasix 80 mg IV TID. Weight down 2 lbs, -1 liter. Feels some what better this morning, however still wheezing.   Objective:   Weight Range:  Vital Signs:   Temp:  [97.3 F (36.3 C)-99.9 F (37.7 C)] 97.3 F (36.3 C) (04/15 0426) Pulse Rate:  [92-122] 109 (04/15 0426) Resp:  [18-20] 20 (04/15 0426) BP: (107-145)/(50-80) 107/65 mmHg (04/15 0426) SpO2:  [92 %-96 %] 94 % (04/15 0857) FiO2 (%):  [28 %] 28 % (04/14 1039) Weight:  [165 lb 12.6 oz (75.2 kg)-167 lb 5.3 oz (75.9 kg)] 165 lb 12.6 oz (75.2 kg) (04/15 0426) Last BM Date: 09/07/13  Weight change: Filed Weights   09/08/13 1214 09/09/13 0426  Weight: 167 lb 5.3 oz (75.9 kg) 165 lb 12.6 oz (75.2 kg)    Intake/Output:   Intake/Output Summary (Last 24 hours) at 09/09/13 0902 Last data filed at 09/09/13 0846  Gross per 24 hour  Intake   1242 ml  Output   2900 ml  Net  -1658 ml     Physical Exam: General appearance: alert, cooperative, no distress and mildly obese  Neck: no carotid bruit, JVP 10-11 cm  Lungs: insp/exp wheezing bilat, decreased breath sounds Rt base  Heart: irregularly irregular rhythm  Abdomen:  obese, RUQ surg scar, splenectomy scar  Extremities: trace edema  Pulses: 2+ and symmetric  Skin: cool and dry  Neurologic: Grossly normal  Telemetry:   Labs: Basic Metabolic Panel:  Recent Labs Lab 09/08/13 1250 09/09/13 0536  NA 139 140  K 3.9 4.5  CL 98 98  CO2 28 29  GLUCOSE 113* 170*  BUN 14 17  CREATININE 0.68 0.72  CALCIUM 9.1 9.6  MG 2.3  --   PHOS 3.1  --     Liver Function Tests:  Recent Labs Lab 09/08/13 1250  AST 25  ALT 12  ALKPHOS 78  BILITOT 1.8*  PROT 7.6  ALBUMIN 3.9   No results found for this basename: LIPASE, AMYLASE,  in the last 168 hours No results found for this basename: AMMONIA,  in the last 168 hours  CBC:  Recent Labs Lab 09/08/13 1250 09/09/13 0536  WBC 12.3* 7.5  NEUTROABS 5.9  --   HGB 8.7* 8.7*  HCT 27.2* 27.2*  MCV 114.3* 114.3*  PLT 540* 541*    Cardiac Enzymes:  Recent Labs Lab 09/08/13 1250  TROPONINI <0.30    BNP: BNP (last 3 results)  Recent Labs  09/08/13 1250  PROBNP 2425.0*      Imaging: X-ray Chest Pa And Lateral   09/08/2013   CLINICAL DATA:  Cough, fever and shortness of breath.  EXAM: CHEST  2 VIEW  COMPARISON:  09/05/2013 and 05/03/2010  FINDINGS: The lungs are adequately inflated without focal consolidation or effusion. There is mild prominence of the perihilar markings suggesting mild vascular congestion. There is stable borderline cardiomegaly. Evidence of prior mitral valve surgery unchanged. There are mild degenerative changes of the spine.  IMPRESSION: Findings suggesting mild vascular congestion.   Electronically Signed   By: Marin Olp M.D.   On: 09/08/2013 15:28      Medications:     Scheduled Medications: . apixaban  5 mg Oral BID  . arformoterol  15 mcg Nebulization BID  . budesonide (PULMICORT) nebulizer solution  0.25 mg Nebulization BID  . deferasirox  1,500 mg Oral Q supper  . diltiazem  240 mg Oral Daily  . fluticasone  2 spray Each Nare Daily  . folic acid  1  mg Oral Daily  . furosemide  40 mg Intravenous 3 times per day  . insulin aspart  0-15 Units Subcutaneous 6 times per day  . levalbuterol  0.63 mg Nebulization Q6H  . levofloxacin (LEVAQUIN) IV  500 mg Intravenous Q24H  . loratadine  10 mg Oral Daily  . methylPREDNISolone (SOLU-MEDROL) injection  40 mg Intravenous Q8H  . pantoprazole  40 mg Oral Daily  . potassium chloride SA  40 mEq Oral BID  . raloxifene  60 mg Oral Daily  . sodium chloride  3 mL Intravenous Q12H  . sodium chloride  3 mL Intravenous Q12H     Infusions:     PRN Medications:  sodium chloride, acetaminophen, acetaminophen, clonazePAM, guaiFENesin, levalbuterol, ondansetron (ZOFRAN) IV, ondansetron, senna-docusate, sodium chloride, traMADol   Assessment/ Plan/Discussion:    1. Asthma exacerbation/acute bronchitis: Patient presented febrile to 102 with wheezing. I suspect she has an asthma exacerbation in the setting of acute bronchitis. At baseline, she has OSA and asthma with chronic respiratory failure on home oxygen, ? OHS.  - nebs/steroids/antibiotics per primary service.  2. Acute on chronic diastolic CHF with moderate RV systolic dysfunction and pulmonary hypertension. Patient remains volume overloaded. Will continue IV lasix 40 mg IV q 8 hrs. Cr stable.  - Echocardiogram pending. 3. Pulmonary arterial hypertension: PVR was only 3.1 WU on last RHC. The [place] may be primarily Who Group 3 in the setting of OSA, asthma, and chronic respiratory failure (on home oxygen, ?OHS).  - Continue CPAP at night and oxygen during the day.  - If symptoms don't improve with DCCV, would consider repeat RHC.  4. Atrial fibrillation: Patient noted to have recurrent atrial fibrillation at last clinic appointment. I suspect the onset of atrial fibrillation has helped to trigger her CHF exacerbation. She had been started on Eliquis with plan for eventual DCCV.  - Continue Eliquis.  - continue diltiazem CD 240 mg daily. HR remains afib  with rate up to 130s. Will start bisoprolol 5 mg bid. - Will try for TEE-DC-CV tomorrow or Friday depending on status of her asthma.   Rande Brunt NP-C 9:14 AM Length of Stay: 1  Advanced Heart Failure Team Pager [phone] (M-F; 7a - 4p)  Please contact [facility] for night-coverage after hours (4p -7a ) and weekends on [email or web address]  Patient seen with NP, agree with the above note.  She is doing better but still wheezy and still volume overloaded.  HR high at times.  Continue current IV Lasix.  Treatment of asthma per primary service.  Will use beta-1 selective bisoprolol rather  than her home metoprolol.  Continue diltiazem CD.  If wheezing improved tomorrow can do TEE-guided DCCV.  If not, aim for Friday.   Larey Dresser 09/09/2013 9:20 AM

## 2013-09-10 ENCOUNTER — Encounter (HOSPITAL_COMMUNITY)
Admission: AD | Disposition: A | Discharge status: Home / Self Care | Payer: BC Managed Care – PPO | Source: Ambulatory Visit | Attending: Pulmonary Disease

## 2013-09-10 DIAGNOSIS — I2789 Other specified pulmonary heart diseases: Secondary | ICD-10-CM | POA: Diagnosis not present

## 2013-09-10 DIAGNOSIS — J441 Chronic obstructive pulmonary disease with (acute) exacerbation: Secondary | ICD-10-CM | POA: Diagnosis not present

## 2013-09-10 DIAGNOSIS — J9610 Chronic respiratory failure, unspecified whether with hypoxia or hypercapnia: Secondary | ICD-10-CM | POA: Diagnosis not present

## 2013-09-10 DIAGNOSIS — I5033 Acute on chronic diastolic (congestive) heart failure: Secondary | ICD-10-CM | POA: Diagnosis not present

## 2013-09-10 LAB — BASIC METABOLIC PANEL
BUN: 28 mg/dL — ABNORMAL HIGH (ref 6–23)
CHLORIDE: 99 meq/L (ref 96–112)
CO2: 29 mEq/L (ref 19–32)
CREATININE: 0.98 mg/dL (ref 0.50–1.10)
Calcium: 9.6 mg/dL (ref 8.4–10.5)
GFR calc non Af Amer: 60 mL/min — ABNORMAL LOW (ref >90)
GFR, EST AFRICAN AMERICAN: 69 mL/min — AB (ref >90)
GLUCOSE: 144 mg/dL — AB (ref 70–99)
Potassium: 5.1 mEq/L (ref 3.7–5.3)
Sodium: 138 mEq/L (ref 137–147)

## 2013-09-10 LAB — GLUCOSE, CAPILLARY
GLUCOSE-CAPILLARY: 143 mg/dL — AB (ref 70–99)
GLUCOSE-CAPILLARY: 125 mg/dL — AB (ref 70–99)
Glucose-Capillary: 129 mg/dL — ABNORMAL HIGH (ref 70–99)
Glucose-Capillary: 132 mg/dL — ABNORMAL HIGH (ref 70–99)
Glucose-Capillary: 141 mg/dL — ABNORMAL HIGH (ref 70–99)
Glucose-Capillary: 159 mg/dL — ABNORMAL HIGH (ref 70–99)

## 2013-09-10 LAB — CBC
HEMATOCRIT: 25.7 % — AB (ref 36.0–46.0)
Hemoglobin: 8.1 g/dL — ABNORMAL LOW (ref 12.0–15.0)
MCH: 36 pg — AB (ref 26.0–34.0)
MCHC: 31.5 g/dL (ref 30.0–36.0)
MCV: 114.2 fL — AB (ref 78.0–100.0)
Platelets: 551 10*3/uL — ABNORMAL HIGH (ref 150–400)
RBC: 2.25 MIL/uL — ABNORMAL LOW (ref 3.87–5.11)
RDW: 15 % (ref 11.5–15.5)
WBC: 21.2 10*3/uL — ABNORMAL HIGH (ref 4.0–10.5)

## 2013-09-10 SURGERY — ECHOCARDIOGRAM, TRANSESOPHAGEAL
Anesthesia: Moderate Sedation

## 2013-09-10 MED ORDER — FUROSEMIDE 80 MG PO TABS
80.0000 mg | ORAL_TABLET | Freq: Every day | ORAL | Status: DC
  Administered 2013-09-10 – 2013-09-11 (×2): 80 mg via ORAL
  Filled 2013-09-10 (×2): qty 1

## 2013-09-10 MED ORDER — POTASSIUM CHLORIDE CRYS ER 20 MEQ PO TBCR
40.0000 meq | EXTENDED_RELEASE_TABLET | Freq: Two times a day (BID) | ORAL | Status: DC
  Administered 2013-09-10 – 2013-09-11 (×2): 40 meq via ORAL
  Filled 2013-09-10 (×3): qty 2

## 2013-09-10 MED ORDER — FUROSEMIDE 40 MG PO TABS
40.0000 mg | ORAL_TABLET | Freq: Every day | ORAL | Status: DC
  Administered 2013-09-10: 40 mg via ORAL
  Filled 2013-09-10 (×2): qty 1

## 2013-09-10 MED ORDER — PREDNISONE 20 MG PO TABS
40.0000 mg | ORAL_TABLET | Freq: Every day | ORAL | Status: DC
  Administered 2013-09-11: 40 mg via ORAL
  Filled 2013-09-10 (×2): qty 2

## 2013-09-10 NOTE — Progress Notes (Signed)
Patient scheduled for TEE-DC-CV for 09/10/13 @ 9:00 am. Confirmation code [place] NP-C 3:18 PM

## 2013-09-10 NOTE — Progress Notes (Signed)
Advanced Heart Failure Rounding Note  Reason for Consult:  AF with RVR  PCP: Dr. Mertha Finders Pulmonologist: Dr. Gwenette Greet  Subjective:    This is a 63 y.o. female with a past medical history significant for PAF, COPD with an asthmatic component on chronic O2, Pulm HTN, and MR. She had MV repair and MAZE in 2011. 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 She has a history of PAF but did well after her MAZE till about a month ago when she was noted to be in AF. Seen Dr Aundra Dubin in the office 08/26/13 and she was started on Eliquis and her Lasix was increased as he felt the pt was volume overloaded then.  Past week low grade fever and started on abx. Increased dyspnea and hypoxia on home O2. Admitted for COPD/asthma exacerbation along with volume overload.  ECHO EF 65-70%, moderate TR, Peak PA pressure 60 Hg, LA mod/severely dilated  Remains on IV lasix 40 mg IV TID. Weight down 1 lbs, 24 hr I/O -2.3 liters. Started on bisoprolol 5 mg daily yesterday. Breathing improving. Cr 0.9.   Objective:   Weight Range:  Vital Signs:   Temp:  [98.1 F (36.7 C)-98.5 F (36.9 C)] 98.1 F (36.7 C) (04/16 0410) Pulse Rate:  [66-104] 66 (04/16 0410) Resp:  [16-20] 18 (04/16 0410) BP: (108-130)/(58-73) 108/58 mmHg (04/16 0410) SpO2:  [91 %-97 %] 97 % (04/16 0410) Weight:  [164 lb (74.39 kg)] 164 lb (74.39 kg) (04/16 0410) Last BM Date: 09/09/13  Weight change: Filed Weights   09/08/13 1214 09/09/13 0426 09/10/13 0410  Weight: 167 lb 5.3 oz (75.9 kg) 165 lb 12.6 oz (75.2 kg) 164 lb (74.39 kg)    Intake/Output:   Intake/Output Summary (Last 24 hours) at 09/10/13 0748 Last data filed at 09/10/13 0507  Gross per 24 hour  Intake    900 ml  Output   3200 ml  Net  -2300 ml     Physical Exam: General appearance: alert, cooperative, no distress and mildly obese  Neck: no carotid bruit, JVP 7 Lungs: insp/exp wheezing bilat, decreased breath sounds Rt base   Heart: irregularly irregular rhythm  Abdomen: obese, RUQ surg scar, splenectomy scar  Extremities: trace edema  Pulses: 2+ and symmetric  Skin: cool and dry  Neurologic: Grossly normal  Telemetry: SR 60s  Labs: Basic Metabolic Panel:  Recent Labs Lab 09/08/13 1250 09/09/13 0536 09/10/13 0315  NA 139 140 138  K 3.9 4.5 5.1  CL 98 98 99  CO2 28 29 29   GLUCOSE 113* 170* 144*  BUN 14 17 28*  CREATININE 0.68 0.72 0.98  CALCIUM 9.1 9.6 9.6  MG 2.3  --   --   PHOS 3.1  --   --     Liver Function Tests:  Recent Labs Lab 09/08/13 1250  AST 25  ALT 12  ALKPHOS 78  BILITOT 1.8*  PROT 7.6  ALBUMIN 3.9   No results found for this basename: LIPASE, AMYLASE,  in the last 168 hours No results found for this basename: AMMONIA,  in the last 168 hours  CBC:  Recent Labs Lab 09/08/13 1250 09/09/13 0536 09/10/13 0315  WBC 12.3* 7.5 21.2*  NEUTROABS 5.9  --   --   HGB 8.7* 8.7* 8.1*  HCT 27.2* 27.2* 25.7*  MCV 114.3* 114.3* 114.2*  PLT 540* 541* 551*    Cardiac Enzymes:  Recent Labs Lab 09/08/13 1250  TROPONINI <0.30  BNP: BNP (last 3 results)  Recent Labs  09/08/13 1250  PROBNP 2425.0*      Imaging: X-ray Chest Pa And Lateral   09/08/2013   CLINICAL DATA:  Cough, fever and shortness of breath.  EXAM: CHEST  2 VIEW  COMPARISON:  09/05/2013 and 05/03/2010  FINDINGS: The lungs are adequately inflated without focal consolidation or effusion. There is mild prominence of the perihilar markings suggesting mild vascular congestion. There is stable borderline cardiomegaly. Evidence of prior mitral valve surgery unchanged. There are mild degenerative changes of the spine.  IMPRESSION: Findings suggesting mild vascular congestion.   Electronically Signed   By: Marin Olp M.D.   On: 09/08/2013 15:28     Medications:     Scheduled Medications: . apixaban  5 mg Oral BID  . arformoterol  15 mcg Nebulization BID  . bisoprolol  5 mg Oral BID  . budesonide  (PULMICORT) nebulizer solution  0.25 mg Nebulization BID  . deferasirox  1,500 mg Oral Q supper  . diltiazem  240 mg Oral Daily  . fluticasone  2 spray Each Nare Daily  . folic acid  1 mg Oral Daily  . furosemide  40 mg Intravenous 3 times per day  . insulin aspart  0-15 Units Subcutaneous 6 times per day  . levalbuterol  0.63 mg Nebulization Q6H  . levofloxacin (LEVAQUIN) IV  500 mg Intravenous Q24H  . loratadine  10 mg Oral Daily  . methylPREDNISolone (SOLU-MEDROL) injection  40 mg Intravenous Q12H  . pantoprazole  40 mg Oral Daily  . potassium chloride SA  40 mEq Oral BID  . raloxifene  60 mg Oral Daily  . sodium chloride  3 mL Intravenous Q12H  . sodium chloride  3 mL Intravenous Q12H    Infusions:    PRN Medications: sodium chloride, acetaminophen, acetaminophen, clonazePAM, guaiFENesin, levalbuterol, ondansetron (ZOFRAN) IV, ondansetron, senna-docusate, sodium chloride, traMADol   Assessment/ Plan/Discussion:    1. Asthma exacerbation/acute bronchitis: Patient presented febrile to 102 with wheezing. I suspect she has an asthma exacerbation in the setting of acute bronchitis. At baseline, she has OSA and asthma with chronic respiratory failure on home oxygen, ? OHS.  - nebs/steroids/antibiotics per primary service.  2. Acute on chronic diastolic CHF with moderate RV systolic dysfunction and pulmonary hypertension. Volume status much improved and Cr increasing. Will switch back to home dose lasix 80 mg q am 40 mg q pm. Continue to follow Cr.  - EF 65-70%, moderate TR, Peak PA pressure 60 mm hg, LA mod/severely dilated 3. Pulmonary arterial hypertension: PVR was only 3.1 WU on last RHC. The [place] may be primarily Who Group 3 in the setting of OSA, asthma, and chronic respiratory failure (on home oxygen, ?OHS).  - Continue CPAP at night and oxygen during the day.  - If symptoms don't improve with DCCV, would consider repeat RHC. Peak PA pressure 60 mm Hg on ECHO 4. Atrial  fibrillation: Patient noted to have recurrent atrial fibrillation at last clinic appointment. I suspect the onset of atrial fibrillation has helped to trigger her CHF exacerbation. She had been started on Eliquis with plan for eventual DCCV.  Still in atrial fibrillation/atypical flutter, rate control is better today.  - Continue Eliquis, diltiazem CD 240 mg daily and bisoprolol 5 mg bid.  Rande Brunt NP-C 7:48 AM Length of Stay: 2  Advanced Heart Failure Team Pager [phone] (M-F; 7a - 4p)  Please contact [facility] for night-coverage after hours (4p -7a ) and  weekends on [email or web address]  Patient seen with NP, agree with the above note.  Rate control is improved.  Breathing better, volume status improved. She is no longer wheezing on exam. EF remains preserved with pulmonary hypertension noted on echo.  She is still in atrial fibrillation.  Plan TEE-guided DCCV tomorrow.   Larey Dresser 09/10/2013 10:42 AM

## 2013-09-10 NOTE — Progress Notes (Signed)
Name: [name] MRN: [id number] DOB: 14-May-1951    ADMISSION DATE:  09/08/2013  PRIMARY SERVICE:  [facility]   CHIEF COMPLAINT:  Fever and wheezing   BRIEF PATIENT DESCRIPTION: 63 y/o WF [name] office pt with Asthma, Pulm HTN, OSA on CPAP/O2 admitted from office 4/14 for Asthma flare, fever and Afib w/ RVR .   SIGNIFICANT EVENTS / STUDIES:  4/14 - Cardiology consult  4/16 - improved SOB, hoarse voice, reduced sputum production  LINES / TUBES:   CULTURES: BC x 2 4/14 >> Viral Panel 4/14 >> Sputum Cx 4/14 >>   ANTIBIOTICS: Levaquin 4/14>>   Pulmonary PMH:  Significant restriction on PFTs. > CT chest no ILD  PFT's 2011: FEV1 1.23 (56%), ratio 72, TLC 3.10 (66%), DLCO 33% but corrects to normal with Av.  [place] 2010: Paralyzed right HD  CT chest 2011: No ISLD or other significant abnl.  [place] 05/2012: Mean PA 36 (58/25), PCWP 19, Fick CO 6.6, calc PVR 2.6 wood units.  OSA >nocturnal oxygen with her cpap  PFT's 2014: FEV1 1.20 (60%), ratio 71, TLC 3.08 (69%), DLCO 36% pred but corrects with Av.    SUBJECTIVE:  Pt reports improved breathing, cough & sputum production.   VITAL SIGNS: Temp:  [98.1 F (36.7 C)-98.5 F (36.9 C)] 98.1 F (36.7 C) (04/16 0410) Pulse Rate:  [66-104] 66 (04/16 0410) Resp:  [16-20] 18 (04/16 0410) BP: (108-130)/(58-73) 108/58 mmHg (04/16 0410) SpO2:  [91 %-97 %] 96 % (04/16 0856) Weight:  [164 lb (74.39 kg)] 164 lb (74.39 kg) (04/16 0410)  PHYSICAL EXAMINATION: Physical Exam  GEN: A/Ox3; pleasant, NAD HEENT: /AT, TMs-wnl, NOSE-clear, THROAT-clear NECK: Supple w/ fair ROM; no JVD; normal carotid impulses w/o bruits; no thyromegaly or nodules palpated; no lymphadenopathy.  RESP resp's even/non-labored, no wheezing (post BD rx), no accessory muscle use CARD: s1s2 rrr, no peripheral edema, pulses intact, no cyanosis or clubbing.  GI: Soft & nt; nml bowel sounds [place]: Warm bil, no deformities or joint swelling noted.  Neuro: alert, no focal  deficits noted.  Skin: Warm, no lesions or rashes   Recent Labs Lab 09/08/13 1250 09/09/13 0536 09/10/13 0315  NA 139 140 138  K 3.9 4.5 5.1  CL 98 98 99  CO2 28 29 29   BUN 14 17 28*  CREATININE 0.68 0.72 0.98  GLUCOSE 113* 170* 144*    Recent Labs Lab 09/08/13 1250 09/09/13 0536 09/10/13 0315  HGB 8.7* 8.7* 8.1*  HCT 27.2* 27.2* 25.7*  WBC 12.3* 7.5 21.2*  PLT 540* 541* 551*   X-ray Chest Pa And Lateral   09/08/2013   CLINICAL DATA:  Cough, fever and shortness of breath.  EXAM: CHEST  2 VIEW  COMPARISON:  09/05/2013 and 05/03/2010  FINDINGS: The lungs are adequately inflated without focal consolidation or effusion. There is mild prominence of the perihilar markings suggesting mild vascular congestion. There is stable borderline cardiomegaly. Evidence of prior mitral valve surgery unchanged. There are mild degenerative changes of the spine.  IMPRESSION: Findings suggesting mild vascular congestion.   Electronically Signed   By: Marin Olp M.D.   On: 09/08/2013 15:28    ASSESSMENT / PLAN:  Acute Asthma Exacerbation w/ possible viral bronchitis - ?doubt superimposed PNA - OP failure on zpak .  OSA  Plan  Empiric ABX w/ Levaquin x7 days Decrease Solumedrol 40mg  IV every 12hr, plan for transition to oral pred in am 4/17 (ordered)  Pulmicort/Brovana Neb in hospital (Symbicort on hold )  Xopenex Neb every 6hr and as needed Q3  Follow influenza panel (neg), PCT(0.28), BC and sputum  Noct CPAP Arranged for outpt follow up with Rexene Edison, NP  Atrial Fib with RVR w/ acute illness and fever.  Chronic Diastolic Heart Failure - appear euvolemic  Pulmonary hypertension - secondary, PA peak 60 on most recent ECHO  Plan  Continue on current regimen w/ bisoprolol, diltiazem 240 Appreciate Cardiology Input Cont Eliquis  Cont Lasix per cards ? 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 Benay Spice on [place] current regimen w/ Exjade  Trend CBC  GERD   Plan  PPI    Steroid Induced DM SSI Trend CBG    Noe Gens, NP-C [facility] Pgr: [phone] or [phone]  Attending:  I have seen and examined the patient with nurse practitioner/resident and agree with the note above.   TEE-CV tomorrow, likely discharge afterwards  Amanda Awkward, MD [place] Pager: [phone] Cell: [phone] If no response, call [phone]

## 2013-09-11 ENCOUNTER — Encounter (HOSPITAL_COMMUNITY)
Admission: AD | Disposition: A | Discharge status: Home / Self Care | Payer: BC Managed Care – PPO | Source: Ambulatory Visit | Attending: Pulmonary Disease

## 2013-09-11 ENCOUNTER — Encounter (HOSPITAL_COMMUNITY): Payer: Medicare Other | Admitting: Anesthesiology

## 2013-09-11 ENCOUNTER — Encounter: Payer: Self-pay | Admitting: Cardiology

## 2013-09-11 ENCOUNTER — Encounter (HOSPITAL_COMMUNITY): Payer: Self-pay | Admitting: Certified Registered"

## 2013-09-11 ENCOUNTER — Inpatient Hospital Stay (HOSPITAL_COMMUNITY): Payer: Medicare Other | Admitting: Anesthesiology

## 2013-09-11 DIAGNOSIS — I2789 Other specified pulmonary heart diseases: Secondary | ICD-10-CM | POA: Diagnosis not present

## 2013-09-11 DIAGNOSIS — J9610 Chronic respiratory failure, unspecified whether with hypoxia or hypercapnia: Secondary | ICD-10-CM | POA: Diagnosis not present

## 2013-09-11 DIAGNOSIS — J441 Chronic obstructive pulmonary disease with (acute) exacerbation: Secondary | ICD-10-CM | POA: Diagnosis not present

## 2013-09-11 DIAGNOSIS — I059 Rheumatic mitral valve disease, unspecified: Secondary | ICD-10-CM

## 2013-09-11 DIAGNOSIS — I5033 Acute on chronic diastolic (congestive) heart failure: Secondary | ICD-10-CM | POA: Diagnosis not present

## 2013-09-11 HISTORY — PX: TEE WITHOUT CARDIOVERSION: SHX5443

## 2013-09-11 HISTORY — PX: CARDIOVERSION: SHX1299

## 2013-09-11 LAB — CBC
HCT: 26.5 % — ABNORMAL LOW (ref 36.0–46.0)
HEMOGLOBIN: 8.3 g/dL — AB (ref 12.0–15.0)
MCH: 35.9 pg — ABNORMAL HIGH (ref 26.0–34.0)
MCHC: 31.3 g/dL (ref 30.0–36.0)
MCV: 114.7 fL — AB (ref 78.0–100.0)
Platelets: 628 10*3/uL — ABNORMAL HIGH (ref 150–400)
RBC: 2.31 MIL/uL — AB (ref 3.87–5.11)
RDW: 15.1 % (ref 11.5–15.5)
WBC: 20.4 10*3/uL — AB (ref 4.0–10.5)

## 2013-09-11 LAB — BASIC METABOLIC PANEL
BUN: 32 mg/dL — ABNORMAL HIGH (ref 6–23)
CHLORIDE: 98 meq/L (ref 96–112)
CO2: 30 meq/L (ref 19–32)
Calcium: 9.7 mg/dL (ref 8.4–10.5)
Creatinine, Ser: 0.9 mg/dL (ref 0.50–1.10)
GFR calc non Af Amer: 66 mL/min — ABNORMAL LOW (ref >90)
GFR, EST AFRICAN AMERICAN: 77 mL/min — AB (ref >90)
Glucose, Bld: 144 mg/dL — ABNORMAL HIGH (ref 70–99)
POTASSIUM: 4.3 meq/L (ref 3.7–5.3)
Sodium: 138 mEq/L (ref 137–147)

## 2013-09-11 LAB — GLUCOSE, CAPILLARY
GLUCOSE-CAPILLARY: 138 mg/dL — AB (ref 70–99)
Glucose-Capillary: 132 mg/dL — ABNORMAL HIGH (ref 70–99)
Glucose-Capillary: 135 mg/dL — ABNORMAL HIGH (ref 70–99)
Glucose-Capillary: 151 mg/dL — ABNORMAL HIGH (ref 70–99)

## 2013-09-11 LAB — CULTURE, RESPIRATORY W GRAM STAIN

## 2013-09-11 LAB — CULTURE, RESPIRATORY: Culture: NORMAL

## 2013-09-11 SURGERY — ECHOCARDIOGRAM, TRANSESOPHAGEAL
Anesthesia: Moderate Sedation

## 2013-09-11 SURGERY — ECHOCARDIOGRAM, TRANSESOPHAGEAL
Anesthesia: General

## 2013-09-11 MED ORDER — LEVOFLOXACIN 500 MG PO TABS: 500.0000 mg | ORAL_TABLET | Freq: Every day | ORAL | Status: AC

## 2013-09-11 MED ORDER — FENTANYL CITRATE 0.05 MG/ML IJ SOLN
INTRAMUSCULAR | Status: DC | PRN
  Administered 2013-09-11 (×2): 25 ug via INTRAVENOUS

## 2013-09-11 MED ORDER — LIDOCAINE VISCOUS 2 % MT SOLN
OROMUCOSAL | Status: AC
  Filled 2013-09-11: qty 15

## 2013-09-11 MED ORDER — BISOPROLOL FUMARATE 5 MG PO TABS: 5.0000 mg | ORAL_TABLET | Freq: Two times a day (BID) | ORAL | Status: DC

## 2013-09-11 MED ORDER — FUROSEMIDE 40 MG PO TABS: 40.0000 mg | ORAL_TABLET | Freq: Every day | ORAL | Status: DC

## 2013-09-11 MED ORDER — LEVOFLOXACIN 500 MG PO TABS
500.0000 mg | ORAL_TABLET | Freq: Once | ORAL | Status: DC
  Filled 2013-09-11 (×2): qty 1

## 2013-09-11 MED ORDER — MIDAZOLAM HCL 10 MG/2ML IJ SOLN
INTRAMUSCULAR | Status: DC | PRN
  Administered 2013-09-11 (×3): 2 mg via INTRAVENOUS

## 2013-09-11 MED ORDER — FUROSEMIDE 40 MG PO TABS: 80.0000 mg | ORAL_TABLET | Freq: Every day | ORAL | Status: DC

## 2013-09-11 MED ORDER — PROPOFOL 10 MG/ML IV BOLUS
INTRAVENOUS | Status: DC | PRN
  Administered 2013-09-11: 60 mg via INTRAVENOUS

## 2013-09-11 MED ORDER — PREDNISONE 20 MG PO TABS
ORAL_TABLET | ORAL | Status: DC
Start: 2013-09-11 — End: 2013-10-13

## 2013-09-11 MED ORDER — SODIUM CHLORIDE 0.9 % IV SOLN: INTRAVENOUS | Status: DC

## 2013-09-11 MED ORDER — POTASSIUM CHLORIDE CRYS ER 20 MEQ PO TBCR: 40.0000 meq | EXTENDED_RELEASE_TABLET | Freq: Two times a day (BID) | ORAL | Status: DC

## 2013-09-11 MED ORDER — MIDAZOLAM HCL 5 MG/ML IJ SOLN
INTRAMUSCULAR | Status: AC
  Filled 2013-09-11: qty 2

## 2013-09-11 MED ORDER — FENTANYL CITRATE 0.05 MG/ML IJ SOLN
INTRAMUSCULAR | Status: AC
  Filled 2013-09-11: qty 2

## 2013-09-11 MED ORDER — FUROSEMIDE 80 MG PO TABS: 80.0000 mg | ORAL_TABLET | Freq: Every day | ORAL | Status: DC

## 2013-09-11 MED ORDER — DILTIAZEM HCL ER COATED BEADS 240 MG PO CP24: 240.0000 mg | ORAL_CAPSULE | Freq: Every day | ORAL | Status: DC

## 2013-09-11 NOTE — Progress Notes (Addendum)
Patient ID: [name], female   DOB: Dec 23, 1950, 63 y.o.   MRN: [id number] Advanced Heart Failure Rounding Note  Reason for Consult:  AF with RVR  PCP: Dr. Mertha Finders Pulmonologist: Dr. Gwenette Greet  Subjective:    This is a 63 y.o. female with a past medical history significant for PAF, COPD with an asthmatic component on chronic O2, Pulm HTN, and MR. She had MV repair and MAZE in 2011. 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 She has a history of PAF but did well after her MAZE till about a month ago when she was noted to be in AF. Seen Dr Aundra Dubin in the office 08/26/13 and she was started on Eliquis and her Lasix was increased as he felt the pt was volume overloaded then.  Past week low grade fever and started on abx. Increased dyspnea and hypoxia on home O2. Admitted for COPD/asthma exacerbation along with volume overload.  ECHO EF 65-70%, moderate TR, Peak PA pressure 60 Hg, LA mod/severely dilated  HR now controlled but still in coarse atrial fibrillation.  Breathing much improved, I/Os negative yesterday on po Lasix.  No further wheezing.   Objective:   Weight Range:  Vital Signs:   Temp:  [97.8 F (36.6 C)-98.4 F (36.9 C)] 98.4 F (36.9 C) (04/17 0400) Pulse Rate:  [67-108] 67 (04/17 0400) Resp:  [16-18] 16 (04/17 0248) BP: (98-121)/(50-67) 98/50 mmHg (04/17 0400) SpO2:  [93 %-100 %] 93 % (04/17 0400) Weight:  [74.435 kg (164 lb 1.6 oz)] 74.435 kg (164 lb 1.6 oz) (04/17 0400) Last BM Date: 09/10/13  Weight change: Filed Weights   09/09/13 0426 09/10/13 0410 09/11/13 0400  Weight: 75.2 kg (165 lb 12.6 oz) 74.39 kg (164 lb) 74.435 kg (164 lb 1.6 oz)    Intake/Output:   Intake/Output Summary (Last 24 hours) at 09/11/13 0805 Last data filed at 09/11/13 0502  Gross per 24 hour  Intake   1050 ml  Output   3100 ml  Net  -2050 ml     Physical Exam: General appearance: alert, cooperative, no distress and mildly obese  Neck: no  carotid bruit, JVP 7 Lungs:no wheezing, mild bibasilar crackles Heart: irregularly irregular rhythm  Abdomen: obese, RUQ surg scar, splenectomy scar  Extremities: trace edema  Pulses: 2+ and symmetric  Skin: cool and dry  Neurologic: Grossly normal  Telemetry: coarse afib in 80s Labs: Basic Metabolic Panel:  Recent Labs Lab 09/08/13 1250 09/09/13 0536 09/10/13 0315 09/11/13 0535  NA 139 140 138 138  K 3.9 4.5 5.1 4.3  CL 98 98 99 98  CO2 28 29 29 30   GLUCOSE 113* 170* 144* 144*  BUN 14 17 28* 32*  CREATININE 0.68 0.72 0.98 0.90  CALCIUM 9.1 9.6 9.6 9.7  MG 2.3  --   --   --   PHOS 3.1  --   --   --     Liver Function Tests:  Recent Labs Lab 09/08/13 1250  AST 25  ALT 12  ALKPHOS 78  BILITOT 1.8*  PROT 7.6  ALBUMIN 3.9   No results found for this basename: LIPASE, AMYLASE,  in the last 168 hours No results found for this basename: AMMONIA,  in the last 168 hours  CBC:  Recent Labs Lab 09/08/13 1250 09/09/13 0536 09/10/13 0315 09/11/13 0535  WBC 12.3* 7.5 21.2* 20.4*  NEUTROABS 5.9  --   --   --   HGB 8.7* 8.7* 8.1*  8.3*  HCT 27.2* 27.2* 25.7* 26.5*  MCV 114.3* 114.3* 114.2* 114.7*  PLT 540* 541* 551* 628*    Cardiac Enzymes:  Recent Labs Lab 09/08/13 1250  TROPONINI <0.30    BNP: BNP (last 3 results)  Recent Labs  09/08/13 1250  PROBNP 2425.0*      Imaging: No results found.   Medications:     Scheduled Medications: . apixaban  5 mg Oral BID  . arformoterol  15 mcg Nebulization BID  . bisoprolol  5 mg Oral BID  . budesonide (PULMICORT) nebulizer solution  0.25 mg Nebulization BID  . deferasirox  1,500 mg Oral Q supper  . diltiazem  240 mg Oral Daily  . fluticasone  2 spray Each Nare Daily  . folic acid  1 mg Oral Daily  . furosemide  40 mg Oral q1800  . furosemide  80 mg Oral Daily  . insulin aspart  0-15 Units Subcutaneous 6 times per day  . levalbuterol  0.63 mg Nebulization Q6H  . levofloxacin (LEVAQUIN) IV  500 mg  Intravenous Q24H  . loratadine  10 mg Oral Daily  . pantoprazole  40 mg Oral Daily  . potassium chloride SA  40 mEq Oral BID  . predniSONE  40 mg Oral Q breakfast  . raloxifene  60 mg Oral Daily  . sodium chloride  3 mL Intravenous Q12H  . sodium chloride  3 mL Intravenous Q12H    Infusions:    PRN Medications: sodium chloride, acetaminophen, acetaminophen, clonazePAM, guaiFENesin, levalbuterol, ondansetron (ZOFRAN) IV, ondansetron, senna-docusate, sodium chloride, traMADol   Assessment/ Plan/Discussion:    1. Asthma exacerbation/acute bronchitis: Patient presented febrile to 102 with wheezing. I suspect she has an asthma exacerbation in the setting of acute bronchitis. At baseline, she has OSA and asthma with chronic respiratory failure on home oxygen, ? OHS.  She has improved, not wheezing on exam today.  WBCs still high likely due to steroids.  - nebs/steroids/antibiotics per primary service.  2. Acute on chronic diastolic CHF with moderate RV systolic dysfunction and pulmonary hypertension. Volume status improved, now on po Lasix.  - Continue current Lasix, this will be her home dose.   - Echo with EF 65-70%, moderate TR, Peak PA pressure 60 mm hg, LA mod/severely dilated 3. Pulmonary arterial hypertension: PVR was only 3.1 WU on last RHC. The [place] may be primarily Who Group 3 in the setting of OSA, asthma, and chronic respiratory failure (on home oxygen, ?OHS).  - Continue CPAP at night and oxygen during the day.  - If symptoms don't improve with DCCV, would consider repeat RHC. Peak PA pressure 60 mm Hg on ECHO 4. Atrial fibrillation: Patient noted to have recurrent atrial fibrillation at last clinic appointment. I suspect the onset of atrial fibrillation has helped to trigger her CHF exacerbation. She had been started on Eliquis with plan for eventual DCCV.  Still in atrial fibrillation/atypical flutter, rate control is better today.  - Continue Eliquis, diltiazem CD 240 mg daily  and bisoprolol 5 mg bid. - TEE-guided DCCV today.  5. 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  Hemoglobin low but stable.  Will need to follow closely while anticoagulated.     Larey Dresser 09/11/2013 8:05 AM

## 2013-09-11 NOTE — Discharge Summary (Signed)
Physician Discharge Summary  Patient ID: [name] MRN: [id number] DOB/AGE: 08/05/1950 63 y.o.  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 Sherrill as previously arranged -continue Exjade  GERD  Discharge Plan: -continue PPI  Steroid Induced Hyperglycemia   Discharge Plan: -will resolve post cessation of steroids -PRN BMP                 DISCHARGE SUMMARY   [name] is a 63 y.o. 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  She was treated with a Z-pak but symptoms did not improve.  Patient felt worse on 4/14 and was seen in the Pulmonary Office.  She continued to report worsening cough, green sputum production, wheezing/tightness, fever of 101, dyspnea on exertion.  In office, she was noted to be very short of breath with HR in 120's, saturations of 83% on RA (she was not on O2 on arrival), sats improved with 2L to 94%.  Patient was direct admit for further evaluation. She recently had been seen by Dr. McLean in office for (4/1) for atrial fibrillation and was started on eliquis & lasix as she was felt to have volume overload at that time.  On admission, patient was treated with empiric levaquin for acute asthma exacerbation, nebulized bronchodilators, IV steroids and diuresis.  Cardiology was consulted with subsequent adjustment in her diuretic & hypertensive regimen.  ECHO 4/15 with EF 65-70%, moderate TR, Peak PA pressure 60 Hg, LA mod/severely dilated.  Re-occurance of atrial fibrillation was thought to be contributing factor to CHF decompensation. Patient improved with therapies as above She   underwent cardioversion on 4/17 with return to NSR.  See discharge plan as above.     SIGNIFICANT EVENTS / STUDIES:  4/14 - Cardiology consult  4/16 - improved SOB, hoarse voice, reduced sputum production  4/17 - Cardioversion per Dr. Haroldine Laws with restoration of normal sinus rhythm     CULTURES:  Sputum Cx 4/14 >> nml flora  ANTIBIOTICS:  Levaquin 4/14>>4/20   Pulmonary PMH:  Significant restriction on PFTs. > CT chest no ILD  PFT's 2011: FEV1 1.23 (56%), ratio 72, TLC 3.10 (66%), DLCO 33% but corrects to normal with Av.  Fluor 2010: Paralyzed right HD  CT chest 2011: No ISLD or other significant abnl.  [place] 05/2012: Mean PA 36 (58/25), PCWP 19, Fick CO 6.6, calc  PVR 2.6 wood units.  OSA >nocturnal oxygen with her cpap  PFT's 2014: FEV1 1.20 (60%), ratio 71, TLC 3.08 (69%), DLCO 36% pred but corrects with Av.    Discharge Exam: GEN: A/Ox3; pleasant, NAD  HEENT: [place]/AT, TMs-wnl, NOSE-clear, THROAT-clear  NECK: Supple w/ fair ROM; no JVD;  RESP resp's even/non-labored, no wheezing, no accessory muscle use  CARD: s1s2 rrr (pt in SR at time of D/C), no peripheral edema, pulses intact, no cyanosis or clubbing.  GI: Soft & nt; nml bowel sounds  Musco: Warm bil, no deformities or joint swelling noted.  Neuro: alert, no focal deficits noted.  Skin: Warm, no lesions or rashes    Filed Vitals:   09/11/13 0955 09/11/13 1018 09/11/13 1353 09/11/13 1538  BP: 104/65 116/62 134/67   Pulse: 63 62 77   Temp:  97.9 F (36.6 C) 97.5 F (36.4 C)   TempSrc:  Oral Axillary   Resp: _0 Height:      Weight:      SpO2: 97% 100% 98% 98%     Discharge Labs  BMET  Recent Labs Lab 09/08/13 1250 09/09/13 0536 09/10/13 0315 09/11/13 0535  NA 139 140 138 138  K 3.9 4.5 5.1 4.3  CL 98 98 99 98  CO2 _1 GLUCOSE 113* 170* 144* 144*  BUN 14 17 28* 32*  CREATININE 0.68 0.72 0.98 0.90  CALCIUM 9.1 9.6 9.6 9.7  MG 2.3  --   --   --   PHOS 3.1  --   --   --    CBC  Recent Labs Lab 09/09/13 0536 09/10/13 0315 09/11/13 0535  HGB 8.7* 8.1* 8.3*  HCT 27.2* 25.7* 26.5*  WBC 7.5 21.2* 20.4*  PLT 541* 551* 628*   Discharge Orders   Future Appointments Provider Department Dept Phone   09/15/2013 11:15 AM Melvenia Needles, NP [facility] [phone]   09/21/2013 8:40 AM [facility] [phone]   09/21/2013 10:30 AM [facility] [phone]   09/21/2013 11:00 AM Ladell Pier, MD [facility] [phone]   12/01/2013 10:45 AM Kathee Delton, MD [facility] [phone]   Future Orders  Complete By Expires   (HEART FAILURE PATIENTS) Call MD:  Anytime you have any of the following symptoms: 1) 3 pound weight gain in 24 hours or 5 pounds in 1 week 2) shortness of breath, with or without a dry hacking cough 3) swelling in the hands, feet or stomach 4) if you have to sleep on extra pillows at night in order to breathe.  As directed  Call MD for:  difficulty breathing, headache or visual disturbances  As directed    Call MD for:  extreme fatigue  As directed    Call MD for:  persistant dizziness or light-headedness  As directed    Call MD for:  persistant nausea and vomiting  As directed    Call MD for:  redness, tenderness, or signs of infection (pain, swelling, redness, odor or green/yellow discharge around incision site)  As directed    Call MD for:  temperature >100.4  As directed    Diet - low sodium heart healthy  As directed    Discharge instructions  As directed    Increase activity slowly  As directed            Follow-up Information   Follow up with PARRETT,TAMMY, NP On 09/15/2013. (appt at 11:15 am )    Specialty:  Nurse Practitioner   Contact information:   [place] [place] [address] [phone]       Follow up with [facility] On 09/21/2013. (@ 8:40 am; [employer]; [facility])    Specialty:  Cardiology   Contact information:   [address] [id number] Danice Goltz [place] [address] [phone]      Schedule an appointment as soon as possible for a visit with [name], MD. (Follow up with Dr. Benay Spice as directed / scheduled. )    Specialty:  Oncology   Contact information:   [place] [address] [phone]          Medication List    STOP taking these medications       metoprolol 50 MG tablet  Commonly known as:  LOPRESSOR      TAKE these medications       acetaminophen 500 MG tablet  Commonly known as:  TYLENOL  Take 1,000 mg by  mouth every 6 (six) hours as needed for mild pain or fever.     AIRBORNE Chew  Chew 2 tablets by mouth 2 (two) times daily.     ALIGN 4 MG Caps  Take 1 tablet by mouth daily.     apixaban 5 MG Tabs tablet  Commonly known as:  ELIQUIS  Take 1 tablet (5 mg total) by mouth 2 (two) times daily.     bisoprolol 5 MG tablet  Commonly known as:  ZEBETA  Take 1 tablet (5 mg total) by mouth 2 (two) times daily.     budesonide-formoterol 160-4.5 MCG/ACT inhaler  Commonly known as:  SYMBICORT  Inhale 2 puffs into the lungs 2 (two) times daily.     calcium carbonate 600 MG Tabs tablet  Commonly known as:  OS-CAL  Take 600 mg by mouth 2 (two) times daily with a meal.     cholecalciferol 1000 UNITS tablet  Commonly known as:  VITAMIN D  Take 1,000 Units by mouth daily.     clonazePAM 1 MG tablet  Commonly known as:  KLONOPIN  Take 1 mg by mouth at bedtime.     deferasirox 500 MG disintegrating tablet  Commonly known as:  EXJADE  Take 3 tablets (1,500 mg total) by mouth daily with supper. Take 3 tablets once daily 30 minutes prior to largest meal     diltiazem 240 MG 24 hr capsule  Commonly known as:  CARDIZEM CD  Take 1 capsule (240 mg total) by mouth daily.     EPINEPHrine 0.3 mg/0.3 mL Kerrin Mo  Commonly known as:  EPI-PEN  Inject 0.3 mg into the muscle once as needed.     Eszopiclone 3 MG Tabs  Take 3 mg by mouth at bedtime as needed (sleep).     fexofenadine 180 MG tablet  Commonly known as:  ALLEGRA  Take 180 mg by mouth at bedtime.     folic acid 1 MG tablet  Commonly known as:  FOLVITE  Take 1 mg by mouth daily.     furosemide 40 MG tablet  Commonly known as:  LASIX  Take 1 tablet (40 mg total) by mouth daily at 6 PM.     furosemide 80 MG tablet  Commonly known as:  LASIX  Take 1 tablet (80 mg total) by mouth daily.     guaiFENesin 600 MG 12 hr tablet  Commonly known as:  MUCINEX  Take 600 mg by mouth 2 (two) times daily as needed for to loosen phlegm.      levofloxacin 500 MG tablet  Commonly known as:  LEVAQUIN  Take 1 tablet (500 mg total) by mouth daily.     mometasone 50 MCG/ACT nasal spray  Commonly known as:  NASONEX  Place 1 spray into both nostrils at bedtime.     NON FORMULARY  Inhale 2 L into the lungs continuous. Oxygen at 2 l/min [place]     pantoprazole 40 MG tablet  Commonly known as:  PROTONIX  Take 40 mg by mouth daily.     potassium chloride SA 20 MEQ tablet  Commonly known as:  K-DUR,KLOR-CON  Take 2 tablets (40 mEq total) by mouth 2 (two) times daily.     predniSONE 20 MG tablet  Commonly known as:  DELTASONE  2 tabs for 3 days, then 1 tab for 3 days, then 1/2 tab for 4 days and then stop     raloxifene 60 MG tablet  Commonly known as:  EVISTA  Take 60 mg by mouth daily.     sodium chloride 0.65 % nasal spray  Commonly known as:  OCEAN  Place 2 sprays into both nostrils 2 (two) times daily.     traMADol 50 MG tablet  Commonly known as:  ULTRAM  Take 50 mg by mouth every 6 (six) hours as needed for moderate pain.     UNABLE TO FIND  Med Name: CPAP          Disposition: Home.  No new home health needs identified prior to discharge.   Discharged Condition: [name] has met maximum benefit of inpatient care and is medically stable and cleared for discharge.  Patient is pending follow up as above.      Time spent on disposition:  Greater than 35 minutes.   Signed: Brandi Ollis, NP-C [facility] Pgr: [phone] Office: [phone]    Attending:  Brent McQuaid, MD [facility] Pager: [phone] Cell: [phone] If no response, call [phone]  

## 2013-09-11 NOTE — CV Procedure (Signed)
    TRANSESOPHAGEAL ECHOCARDIOGRAM GUIDED DIRECT CURRENT CARDIOVERSION  NAME:  [name]   MRN: [id number] DOB:  07-Mar-1951   ADMIT DATE: 09/08/2013  INDICATIONS:  AFIB   PROCEDURE:   Informed consent was obtained prior to the procedure. The risks, benefits and alternatives for the procedure were discussed and the patient comprehended these risks.  Risks include, but are not limited to, cough, sore throat, vomiting, nausea, somnolence, esophageal and stomach trauma or perforation, bleeding, low blood pressure, aspiration, pneumonia, infection, trauma to the teeth and death.    After a procedural time-out, the patient was given 6 mg versed and 50 mcg fentanyl for moderate sedation.  The oropharynx was anesthetized 10 cc of topical 1% viscous lidocaine.  The transesophageal probe was inserted in the esophagus and stomach without difficulty and multiple views were obtained.     FINDINGS:  LEFT VENTRICLE: EF = 60-65%  RIGHT VENTRICLE: Normal size. Mildly hypokinetic.   LEFT ATRIUM: Severely dilated. Diameter 6.0 cm  LEFT ATRIAL APPENDAGE: Large. Previously sewn off. No clot.  RIGHT ATRIUM: Moderately to severely dilated  AORTIC VALVE:  Trileaflet. No AS/AI  MITRAL VALVE:    S/p MV ring. No MR. Mild to moderate MS mean gradient 50mm HG. MVA by pressure 1/2 time 1.7 cm2  TRICUSPID VALVE: Normal. Moderate TR  PULMONIC VALVE: not well seen  INTERATRIAL SEPTUM: No PFO/ASD  PERICARDIUM: No effusion  DESCENDING AORTA: Mild Plaque  LL Pulmonary vein: normal flow  CARDIOVERSION:     Indications:  Atrial Fibrillation  Procedure Details:  Once the TEE was complete, the patient had the defibrillator pads placed in the anterior and posterior position. The patient then underwent further sedation by the anesthesia service for cardioversion. Once an appropriate level of sedation was achieved, the patient received a single biphasic, synchronized 200J shock with prompt conversion to  sinus rhythm. No apparent complications.  COMPLICATIONS:    There were no immediate complications.   CONCLUSION:   1.  Successful TEE guided cardioversion.   Given atrial size may need anti-arrhythmic therapy down the road to maintain sinus.  Daniel R Bensimhon,MD 9:03 AM

## 2013-09-11 NOTE — Progress Notes (Signed)
Echocardiogram Echocardiogram Transesophageal has been performed.  Alyson Locket Daemon Dowty 09/11/2013, 9:32 AM

## 2013-09-11 NOTE — Progress Notes (Signed)
Patient places herself on and off CPAP when ready. 

## 2013-09-11 NOTE — Transfer of Care (Signed)
Immediate Anesthesia Transfer of Care Note  Patient: [name]  Procedure(s) Performed: Procedure(s): TRANSESOPHAGEAL ECHOCARDIOGRAM (TEE) (N/A) CARDIOVERSION (N/A)  Patient Location: Endoscopy Unit  Anesthesia Type:MAC  Level of Consciousness: awake, alert  and oriented  Airway & Oxygen Therapy: Patient Spontanous Breathing and Patient connected to nasal cannula oxygen  Post-op Assessment: Report given to PACU RN and Post -op Vital signs reviewed and stable  Post vital signs: Reviewed and stable  Complications: No apparent anesthesia complications

## 2013-09-11 NOTE — Anesthesia Postprocedure Evaluation (Signed)
  Anesthesia Post-op Note  Patient: [name]  Procedure(s) Performed: Procedure(s): TRANSESOPHAGEAL ECHOCARDIOGRAM (TEE) (N/A) CARDIOVERSION (N/A)  Patient Location: Endoscopy Unit  Anesthesia Type:MAC  Level of Consciousness: awake, alert  and oriented  Airway and Oxygen Therapy: Patient Spontanous Breathing and Patient connected to nasal cannula oxygen  Post-op Pain: none  Post-op Assessment: Post-op Vital signs reviewed, Patient's Cardiovascular Status Stable, Respiratory Function Stable, Patent Airway and No signs of Nausea or vomiting  Post-op Vital Signs: Reviewed and stable  Last Vitals:  Filed Vitals:   09/11/13 0920  BP: 112/61  Pulse: 68  Temp:   Resp: 16    Complications: No apparent anesthesia complications

## 2013-09-11 NOTE — Anesthesia Procedure Notes (Signed)
Procedure Name: MAC Date/Time: 09/11/2013 9:29 AM Performed by: [place] Pink Pre-anesthesia Checklist: Patient identified, Timeout performed, Emergency Drugs available, Suction available and Patient being monitored Patient Re-evaluated:Patient Re-evaluated prior to inductionOxygen Delivery Method: Ambu bag Preoxygenation: Pre-oxygenation with 100% oxygen Intubation Type: IV induction Ventilation: Mask ventilation without difficulty Dental Injury: Teeth and Oropharynx as per pre-operative assessment

## 2013-09-11 NOTE — Progress Notes (Signed)
Afib ended at approximately 0030 on 09/11/13.  A 12-lead ECG was performed and the on-call MD for Cardiology was notified.  Per the on-call MD, the current rhythm that the pt is in is atrial tachycardia.  Will continue to monitor and will pass on in report.

## 2013-09-11 NOTE — Progress Notes (Signed)
Patient discharged to home.  Patient alert, oriented, verbally responsive, breathing regular and non-labored throughout, no s/s of distress noted throughout, no c/o pain throughout.  Discharge instructions verbalized to patient and husband at bedside.  Both verbalized understanding throughout.  Patient left unit per wheelchair accompanied by nurse Cloretta Ned and husband.  VS WNL.    Dirk Dress 09/11/2013

## 2013-09-11 NOTE — Anesthesia Preprocedure Evaluation (Signed)
Anesthesia Evaluation  Patient identified by MRN, date of birth, ID band Patient awake    Reviewed: Allergy & Precautions, H&P , NPO status , Patient's Chart, lab work & pertinent test results, reviewed documented beta blocker date and time   Airway Mallampati: II TM Distance: >3 FB Neck ROM: full    Dental   Pulmonary shortness of breath and with exertion, asthma , sleep apnea ,  breath sounds clear to auscultation        Cardiovascular hypertension, On Medications and On Home Beta Blockers +CHF + Valvular Problems/Murmurs MR Rhythm:regular     Neuro/Psych PSYCHIATRIC DISORDERS  Neuromuscular disease negative psych ROS   GI/Hepatic Neg liver ROS, GERD-  Medicated and Controlled,  Endo/Other  negative endocrine ROS  Renal/GU negative Renal ROS  negative genitourinary   Musculoskeletal   Abdominal   Peds  Hematology  (+) Blood dyscrasia, anemia ,   Anesthesia Other Findings See surgeon's H&P   Reproductive/Obstetrics negative OB ROS                           Anesthesia Physical Anesthesia Plan  ASA: III  Anesthesia Plan: General   Post-op Pain Management:    Induction: Intravenous  Airway Management Planned: Mask  Additional Equipment:   Intra-op Plan:   Post-operative Plan:   Informed Consent: I have reviewed the patients History and Physical, chart, labs and discussed the procedure including the risks, benefits and alternatives for the proposed anesthesia with the patient or authorized representative who has indicated his/her understanding and acceptance.   Dental Advisory Given  Plan Discussed with: CRNA and Surgeon  Anesthesia Plan Comments:         Anesthesia Quick Evaluation

## 2013-09-14 ENCOUNTER — Encounter (HOSPITAL_COMMUNITY): Payer: Self-pay | Admitting: Internal Medicine

## 2013-09-15 ENCOUNTER — Encounter: Payer: Self-pay | Admitting: Adult Health

## 2013-09-15 ENCOUNTER — Ambulatory Visit (INDEPENDENT_AMBULATORY_CARE_PROVIDER_SITE_OTHER): Payer: BC Managed Care – PPO | Admitting: Adult Health

## 2013-09-15 VITALS — BP 108/66 | HR 83 | Temp 98.5°F | Ht 62.5 in | Wt 160.8 lb

## 2013-09-15 DIAGNOSIS — I4891 Unspecified atrial fibrillation: Secondary | ICD-10-CM

## 2013-09-15 DIAGNOSIS — J45901 Unspecified asthma with (acute) exacerbation: Secondary | ICD-10-CM

## 2013-09-15 DIAGNOSIS — G4733 Obstructive sleep apnea (adult) (pediatric): Secondary | ICD-10-CM

## 2013-09-15 NOTE — Patient Instructions (Signed)
Finish prednisone as directed.  Mucinex DM Twice daily  As needed  Cough/congestion  Continue on Symbicort 2 puffs Twice daily  , rinse after use.  Follow up with cardiology next week as planned  Please contact office for sooner follow up if symptoms do not improve or worsen or seek emergency care  follow up Dr. Gwenette Greet in 4 weeks and As needed

## 2013-09-15 NOTE — Assessment & Plan Note (Signed)
S/p cardioversion 09/11/13  Cont follow up with cards next week.  Cont on regimen w/ eliquis, cardizem, bisoprolol

## 2013-09-15 NOTE — Progress Notes (Signed)
Subjective:    Patient ID: [name], female    DOB: [birth year], 63 y.o.   MRN: [id number]  HPI  63 yo WF with known hx of Asthma , OSA on CPAP /O2 and Pulmonary HTN Paralyzed right hemidiaphragm,  Significant restriction on PFTs.  > CT chest no ILD  PFT's 2011:  FEV1 1.23 (56%), ratio 72, TLC 3.10 (66%), DLCO 33% but corrects to normal with Av.  Fluor 2010:  Paralyzed right HD CT chest 2011:  No ISLD or other significant abnl.  [place] 05/2012:  Mean PA 36 (58/25), PCWP 19, Fick CO 6.6, calc PVR 2.6 wood units. OSA >nocturnal oxygen with her cpap PFT's 2014:  FEV1 1.20 (60%), ratio 71, TLC 3.08 (69%), DLCO 36% pred but corrects with Av.   09/08/13  Acute OV  Complains of dry cough x 5 days,temp. 96- Was seen at [facility] 4/11  Rx Z-pak  Says labs throat culture, neg.  Says she is no better w/ sob getting worse,cough now green,temp 101 this am,wheezing,mid chest tightness,wears CPAP with 2L, 2L cont. during day On arrival to office today , HR up ~120 .  Very short of breath with walking.  Will need admission for further evaluation.  She denies any hemoptysis, orthopnea, PND, increased leg swelling, nausea, vomiting, or diarrhea. 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 >>ADMIT   09/15/2013 [facility] follow up  Patient presents for a post hospital followup. She was admitted April 14 2 April 17 for an acute asthma exacerbation complicated by atrial fibrillation with RVR an acute on chronic diastolic heart failure , requiring diuresis. Patient was treated with IV antibiotics, and steroids Discharged on Levaquin and a prednisone She was seen by cardiology.echo showed ejection fracture of 65-70%, with moderate tricuspid regurgitation, and pulmonary  artery pressures at 60 with left atrial moderate to severe dilatation. She did undergo cardioversion on April 17 with return to normal sinus rhythm. Sputum culture showed normal flora. Since discharge. Patient is feeling better but still weak.  Reports is improved overall but still having wheezing, chest congestion, dry cough, hoarseness.  Denies any f/c/s, hemoptysis, nausea, vomiting.   Still taking prednisone, was one day short on Levaquin (had full 7 days ) .    Review of Systems  Constitutional:   No  weight loss, night sweats,  Fevers, chills,  +fatigue, or  lassitude.  HEENT:   No headaches,  Difficulty swallowing,  Tooth/dental problems, or  Sore throat,                No sneezing, itching, ear ache,  +nasal congestion, post nasal drip,   CV:  No chest pain,  Orthopnea, PND, swelling in lower extremities, anasarca, dizziness, palpitations  GI  No heartburn, indigestion, abdominal pain, nausea, vomiting, diarrhea, change in bowel habits, loss of appetite, bloody stools.   Resp:  .  No chest wall deformity  Skin: no rash or lesions.  GU: no dysuria, change in color of urine, no urgency or frequency.  No flank pain, no hematuria   MS:  No joint pain or swelling.  No decreased range of motion.  No back pain.  Psych:  No change in mood or affect. No depression or anxiety.  No memory loss.         Objective:  Physical Exam  GEN: A/Ox3; pleasant , NAD, elderly  HEENT:  [place]/AT,  EACs-clear, TMs-wnl, NOSE-clear, THROAT-clear, no lesions, no postnasal drip or exudate noted.   NECK:  Supple w/ fair ROM; no JVD; normal carotid impulses w/o bruits; no thyromegaly or nodules palpated; no lymphadenopathy.  RESP  Decreased BS in bases no wheezing no accessory muscle use, no dullness to percussion  CARD:  RRR  no m/r/g  , no peripheral edema, pulses intact, no cyanosis or clubbing.  GI:   Soft & nt; nml bowel sounds; no organomegaly or masses detected.  Musco: Warm bil, no  deformities or joint swelling noted.   Neuro: alert, no focal deficits noted.    Skin: Warm, no lesions or rashes         Assessment & Plan:

## 2013-09-15 NOTE — Assessment & Plan Note (Signed)
Recent exacerbation w/ hospital admission complicated by fluid overload with afib rvr >resolving  cxr /sputum cx w/ no acute findings  Plan  Finish prednisone as directed.  Mucinex DM Twice daily  As needed  Cough/congestion  Continue on Symbicort 2 puffs Twice daily  , rinse after use.  Follow up with cardiology next week as planned  Please contact office for sooner follow up if symptoms do not improve or worsen or seek emergency care  follow up Dr. Gwenette Greet in 4 weeks and As needed

## 2013-09-15 NOTE — Assessment & Plan Note (Signed)
Cont on CPAP At bedtime  

## 2013-09-16 ENCOUNTER — Telehealth: Payer: Self-pay

## 2013-09-16 NOTE — Telephone Encounter (Signed)
Pt has followed up with TP, nothing further needed.

## 2013-09-16 NOTE — Telephone Encounter (Signed)
Message copied by Len Blalock on Wed Sep 16, 2013  2:03 PM ------      Message from: Horatio Pel      Created: Fri Sep 11, 2013  2:29 PM                   ----- Message -----         From: Juanito Doom, MD         Sent: 09/11/2013  12:19 PM           To: Kathee Delton, MD, [facility] Triage Pool            A,            Please schedule a hospital f/u with Dr. Gwenette Greet in 2-3 weeks.            Thanks      B ------

## 2013-09-21 ENCOUNTER — Telehealth: Payer: Self-pay | Admitting: *Deleted

## 2013-09-21 ENCOUNTER — Telehealth: Payer: Self-pay | Admitting: Oncology

## 2013-09-21 ENCOUNTER — Encounter (HOSPITAL_COMMUNITY): Payer: Self-pay

## 2013-09-21 ENCOUNTER — Ambulatory Visit (HOSPITAL_BASED_OUTPATIENT_CLINIC_OR_DEPARTMENT_OTHER): Payer: BC Managed Care – PPO | Admitting: Oncology

## 2013-09-21 ENCOUNTER — Other Ambulatory Visit (HOSPITAL_BASED_OUTPATIENT_CLINIC_OR_DEPARTMENT_OTHER): Payer: BC Managed Care – PPO

## 2013-09-21 ENCOUNTER — Ambulatory Visit (HOSPITAL_COMMUNITY)
Admit: 2013-09-21 | Discharge: 2013-09-21 | Disposition: A | Discharge status: Home / Self Care | Payer: BC Managed Care – PPO | Source: Ambulatory Visit | Attending: Cardiology | Admitting: Cardiology

## 2013-09-21 VITALS — BP 129/69 | HR 80 | Temp 97.8°F | Resp 18 | Ht 62.0 in | Wt 161.8 lb

## 2013-09-21 VITALS — BP 118/68 | HR 102 | Wt 161.1 lb

## 2013-09-21 DIAGNOSIS — I509 Heart failure, unspecified: Secondary | ICD-10-CM | POA: Diagnosis not present

## 2013-09-21 DIAGNOSIS — I4891 Unspecified atrial fibrillation: Secondary | ICD-10-CM | POA: Diagnosis not present

## 2013-09-21 DIAGNOSIS — I2789 Other specified pulmonary heart diseases: Secondary | ICD-10-CM | POA: Diagnosis not present

## 2013-09-21 DIAGNOSIS — D559 Anemia due to enzyme disorder, unspecified: Secondary | ICD-10-CM

## 2013-09-21 DIAGNOSIS — I340 Nonrheumatic mitral (valve) insufficiency: Secondary | ICD-10-CM

## 2013-09-21 DIAGNOSIS — M949 Disorder of cartilage, unspecified: Secondary | ICD-10-CM

## 2013-09-21 DIAGNOSIS — D594 Other nonautoimmune hemolytic anemias: Secondary | ICD-10-CM

## 2013-09-21 DIAGNOSIS — I272 Pulmonary hypertension, unspecified: Secondary | ICD-10-CM

## 2013-09-21 DIAGNOSIS — M899 Disorder of bone, unspecified: Secondary | ICD-10-CM

## 2013-09-21 DIAGNOSIS — I059 Rheumatic mitral valve disease, unspecified: Secondary | ICD-10-CM | POA: Insufficient documentation

## 2013-09-21 DIAGNOSIS — D649 Anemia, unspecified: Secondary | ICD-10-CM

## 2013-09-21 DIAGNOSIS — I5032 Chronic diastolic (congestive) heart failure: Secondary | ICD-10-CM | POA: Diagnosis not present

## 2013-09-21 DIAGNOSIS — R0989 Other specified symptoms and signs involving the circulatory and respiratory systems: Secondary | ICD-10-CM

## 2013-09-21 DIAGNOSIS — R0609 Other forms of dyspnea: Secondary | ICD-10-CM

## 2013-09-21 LAB — CBC WITH DIFFERENTIAL/PLATELET
BASO%: 0.3 % (ref 0.0–2.0)
BASOS ABS: 0.1 10*3/uL (ref 0.0–0.1)
EOS%: 1.3 % (ref 0.0–7.0)
Eosinophils Absolute: 0.4 10*3/uL (ref 0.0–0.5)
HEMATOCRIT: 28.8 % — AB (ref 34.8–46.6)
HEMOGLOBIN: 9 g/dL — AB (ref 11.6–15.9)
LYMPH%: 22.8 % (ref 14.0–49.7)
MCH: 36.4 pg — AB (ref 25.1–34.0)
MCHC: 31.3 g/dL — AB (ref 31.5–36.0)
MCV: 116.6 fL — ABNORMAL HIGH (ref 79.5–101.0)
MONO#: 3.6 10*3/uL — AB (ref 0.1–0.9)
MONO%: 11.6 % (ref 0.0–14.0)
NEUT#: 19.7 10*3/uL — ABNORMAL HIGH (ref 1.5–6.5)
NEUT%: 64 % (ref 38.4–76.8)
PLATELETS: 534 10*3/uL — AB (ref 145–400)
RBC: 2.47 10*6/uL — ABNORMAL LOW (ref 3.70–5.45)
RDW: 16.2 % — ABNORMAL HIGH (ref 11.2–14.5)
WBC: 30.8 10*3/uL — ABNORMAL HIGH (ref 3.9–10.3)
lymph#: 7 10*3/uL — ABNORMAL HIGH (ref 0.9–3.3)
nRBC: 4 % — ABNORMAL HIGH (ref 0–0)

## 2013-09-21 LAB — BASIC METABOLIC PANEL
BUN: 22 mg/dL (ref 6–23)
CO2: 29 meq/L (ref 19–32)
CREATININE: 1.05 mg/dL (ref 0.50–1.10)
Calcium: 9.1 mg/dL (ref 8.4–10.5)
Chloride: 100 mEq/L (ref 96–112)
GFR calc Af Amer: 63 mL/min — ABNORMAL LOW (ref >90)
GFR calc non Af Amer: 55 mL/min — ABNORMAL LOW (ref >90)
GLUCOSE: 90 mg/dL (ref 70–99)
Potassium: 3.5 mEq/L — ABNORMAL LOW (ref 3.7–5.3)
Sodium: 140 mEq/L (ref 137–147)

## 2013-09-21 LAB — FERRITIN CHCC: Ferritin: 118 ng/ml (ref 9–269)

## 2013-09-21 LAB — PRO B NATRIURETIC PEPTIDE: Pro B Natriuretic peptide (BNP): 936.3 pg/mL — ABNORMAL HIGH (ref 0–125)

## 2013-09-21 LAB — TECHNOLOGIST REVIEW

## 2013-09-21 NOTE — Patient Instructions (Signed)
Labs today.  Your physician recommends that you schedule a follow-up appointment in: 1 month  Do the following things EVERYDAY: 1) Weigh yourself in the morning before breakfast. Write it down and keep it in a log. 2) Take your medicines as prescribed 3) Eat low salt foods-Limit salt (sodium) to 2000 mg per day.  4) Stay as active as you can everyday 5) Limit all fluids for the day to less than 2 liters 6)  

## 2013-09-21 NOTE — Progress Notes (Signed)
  [facility] OFFICE PROGRESS NOTE   Diagnosis: Pyruvate kinase deficiency  INTERVAL HISTORY:   She returns as scheduled. She continues Exjade. She was admitted 09/08/2013 with a febrile illness and was diagnosed with an asthma exacerbation and congestive heart failure. She was noted to have atrial fibrillation with a rapid ventricular response and underwent a cardioversion 09/11/2013. The Lasix dose was increased. She completed a steroid taper today.  [name] reports feeling much better. The bone pain is controlled with tramadol. She now wears oxygen all of the time.  Objective:  Vital signs in last 24 hours:  Blood pressure 129/69, pulse 80, temperature 97.8 F (36.6 C), temperature source Oral, resp. rate 18, height 5\' 2"  (1.575 m), weight 161 lb 12.8 oz (73.392 kg), SpO2 96.00%, peak flow 2 L/min.    Resp: Scattered and inspiratory/expiratory rhonchi and wheeze at the right greater than left chest, no respiratory distress Cardio: Regular rate and rhythm GI: No hepatomegaly Vascular: No leg edema   Lab Results: Hemoglobin 9, platelets 534,000, MCV 116.6, white count 30.8, ANC 19.7   Imaging:  No results found.  Medications: I have reviewed the patient's current medications.  Assessment/Plan: 1. 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 2. Secondary iron overload: Maintained on Exjade. Exjade was resumed 10/14/2009. We will follow up on a ferritin level from today. 3. Hospitalization 05/06 through 10/06/2009 with acute renal failure, likely secondary to dehydration. She was also noted to have an elevated ammonia level. The ammonia level normalized during the hospitalization.  4. Hospitalization 03/18 through 08/16/2009 with pneumonia. 5. History of atrial fibrillation/flutter: Status post a Maze procedure. 6. History of mitral regurgitation: Status post mitral valve repair by Dr. [name] in February 2010. 7. History  of a pericardial effusion. 8. Chronic bone pain: Controlled with tramadol. 9. 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 10. Exertional dyspnea: Likely related to multiple factors including cardiac disease, diaphragmatic paralysis, anemia, asthma and pulmonary hypertension. She is followed by Dr. Gwenette Greet and Dr. Haroldine Laws 11. Pneumococcal vaccine-she had a local reaction to a pneumococcal vaccine in the remote past. She tolerated the pneumococcal vaccine in December of 2007 without apparent toxicity. She received a pneumococcal vaccine on 04/08/2012 12. Admission 09/08/2013 with an upper respiratory infection and heart failure with rapid atrial fibrillation, status post a cardioversion 09/11/2013   Disposition:   The hemoglobin is stable.I suspect the leukocytosis is related to the recent steroids in the setting of a splenectomy. We will followup on the ferritin level from today. She will continue  Exjade.  [name] continues followup with pulmonary and cardiology for management of the heart failure and pulmonary hypertension.   She will return for a lab visit in 3 months. She is scheduled for a 6 month office visit.  Ladell Pier, MD  09/21/2013  11:10 AM

## 2013-09-21 NOTE — Progress Notes (Signed)
Patient ID: [name], female   DOB: 1950-10-27, 63 y.o.   MRN: [id number] PCP: Dr. Inda Merlin  HPI: [name] is a 63 y/o woman with h/o HTN, pyruvate kinase deficiency with hemolytic anemia, obesity, asthma, OSA (on CPAP), mitral valve prolapse s/p mini MV repair/Maze (2011 with Dr. [name]), PAF s/p RFA followed by surgical Maze 1540 and diastolic dysfunction.  Pre-op cath with normal coronaries in 2011. 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   Echo 12/13 EF 60-65% mild LVH. Grade 2 DD. MVR stable (no MS noted). Moderate LAE. RV mildly dilated. Mod-severe TR with estimated RVSP 80-90 range (in 2011 was 40-45 mm HG).  Baseline hgb 7-9. Doesn't get transfused unless 6 or below.  [place] 06/24/12 RA = 10  RV = 60/7/12  PA = 58/25 (40)  PCW = 19 (no significant v-waves)  Fick cardiac output/index = 6.6/3.7  PVR = 3.1 [place]  FA sat = 91%  PA sat = 66%, 67%  PVR was not significantly elevated so selective pulmonary vasodilators were noted started.   ECHO 10/22/12 EF 55% RV mildly decreased + Septal bounce. Moderate TR. RVSP 65-70. +diastolic dysfunction (likely restrictive - but tissue Doppler not performed).   At last appointment, patient was noted to be in atrial fibrillation and was volume overloaded.  Lasix was increased.  She ended up developing a fever and wheezing so was admitted later in 4/15 with fever and asthma exacerbation due to probable acute bronchitis.  She also was volume overloaded and in atrial fibrillation.  She was treated with prednisone and antibiotics and diuresed with IV Lasix.  She had a TEE-guided DCCV to NSR.   TEE (4/15): EF 60-65%, severe bilateral atrial enlargement, s/p MV ring with no MR and mild mitral stenosis with meean gradient 6, MVA 1.7 cm^2.  PASP on TTE in 4/15 was 60 mmHg.   Patient remains in NSR today subsequent to cardioversion.  She feels better than prior to her hospitalization but is still easily fatigued.  She is short  of breath walking across her house. No more wheezing.  She has finished antibiotics course and finishes steroids today. Weight is down 6 lbs since last appointment.   ECG: NSR with PVC  Labs (7/14): Potassium 4.6 BUN 16 Creatinine 0.68 hemoglobin 9, LDL 63 Labs (4/15): K 4.3, creatinine 0.9, HCT 26.5  ROS: All systems negative except as listed in HPI, PMH and Problem List.  Past Medical History  Diagnosis Date  . Pyruvate kinase deficiency hemolytic anemia   . Asthma   . Syncope and collapse 2012    r/t anemia  . Anemia   . Dyspnea   . Mitral regurgitation     s/p MV repair complicated by diaphragmatic paralysis  . Fatigue   . Myoclonus   . Heart murmur     severe MR s/p MV repair  . GERD (gastroesophageal reflux disease)   . Allergic rhinitis   . Depression   . Gastritis     NSAID induced  . Asthma   . Hx of asplenia     surgical  . Osteoporosis   . Trochanteric bursitis     left hip  . Pulmonary HTN     mild to moderate by [place] 05/2012  . Exercise hypoxemia     supplemental O2 PRN  . Hypertension   . Atrial fibrillation     s/p RFA and Maze procedure - resolved  . Atrial tachycardia  Current Outpatient Prescriptions  Medication Sig Dispense Refill  . acetaminophen (TYLENOL) 500 MG tablet Take 1,000 mg by mouth every 6 (six) hours as needed for mild pain or fever.      Marland Kitchen apixaban (ELIQUIS) 5 MG TABS tablet Take 1 tablet (5 mg total) by mouth 2 (two) times daily.  60 tablet  6  . bisoprolol (ZEBETA) 5 MG tablet Take 1 tablet (5 mg total) by mouth 2 (two) times daily.  30 tablet  3  . budesonide-formoterol (SYMBICORT) 160-4.5 MCG/ACT inhaler Inhale 2 puffs into the lungs 2 (two) times daily.      . calcium carbonate (OS-CAL) 600 MG TABS Take 600 mg by mouth 2 (two) times daily with a meal.      . cholecalciferol (VITAMIN D) 1000 UNITS tablet Take 1,000 Units by mouth daily.      . clonazePAM (KLONOPIN) 1 MG tablet Take 1 mg by mouth at bedtime as needed.       .  deferasirox (EXJADE) 500 MG disintegrating tablet Take 3 tablets (1,500 mg total) by mouth daily with supper. Take 3 tablets once daily 30 minutes prior to largest meal  90 tablet  11  . diltiazem (CARDIZEM CD) 240 MG 24 hr capsule Take 1 capsule (240 mg total) by mouth daily.  30 capsule  3  . EPINEPHrine (EPI-PEN) 0.3 mg/0.3 mL DEVI Inject 0.3 mg into the muscle once as needed.      . Eszopiclone 3 MG TABS Take 3 mg by mouth at bedtime as needed (sleep).       . fexofenadine (ALLEGRA) 180 MG tablet Take 180 mg by mouth at bedtime.       . folic acid (FOLVITE) 1 MG tablet Take 1 mg by mouth daily.      . furosemide (LASIX) 40 MG tablet Take 1 tablet (40 mg total) by mouth daily at 6 PM.  30 tablet  3  . furosemide (LASIX) 80 MG tablet Take 1 tablet (80 mg total) by mouth daily.  30 tablet  3  . guaiFENesin (MUCINEX) 600 MG 12 hr tablet Take 600 mg by mouth 2 (two) times daily as needed for to loosen phlegm.       . mometasone (NASONEX) 50 MCG/ACT nasal spray Place 1 spray into both nostrils at bedtime.       . Multiple Vitamins-Minerals (AIRBORNE) CHEW Chew 2 tablets by mouth 2 (two) times daily.      . NON FORMULARY Inhale 2 L into the lungs continuous. Oxygen at 2 l/min [place]      . pantoprazole (PROTONIX) 40 MG tablet Take 40 mg by mouth daily.      . potassium chloride SA (K-DUR,KLOR-CON) 20 MEQ tablet Take 2 tablets (40 mEq total) by mouth 2 (two) times daily.  120 tablet  3  . predniSONE (DELTASONE) 20 MG tablet 2 tabs for 3 days, then 1 tab for 3 days, then 1/2 tab for 4 days and then stop  11 tablet  0  . Probiotic Product (ALIGN) 4 MG CAPS Take 1 tablet by mouth daily.      . raloxifene (EVISTA) 60 MG tablet Take 60 mg by mouth daily.      . sodium chloride (OCEAN) 0.65 % nasal spray Place 2 sprays into both nostrils 2 (two) times daily.       . traMADol (ULTRAM) 50 MG tablet Take 50 mg by mouth 3 (three) times daily.       Marland Kitchen UNABLE TO  FIND Med Name: CPAP       No current  facility-administered medications for this encounter.   History  Substance Use Topics  . Smoking status: Never Smoker   . Smokeless tobacco: Never Used  . Alcohol Use: Yes     Comment: socially glass red wine monthly   Family History  Problem Relation Age of Onset  . Breast cancer Mother 65    ER+  . Melanoma Father 41  . Adrenal disorder Sister     pheochromacytoma - rt. adrenal gland  . COPD Maternal Aunt   . Stomach cancer Paternal Uncle     onset in 11s  . Lung cancer Maternal Grandmother     died late 2s, heavy smoker  . Heart disease Maternal Grandfather     hardening of the arteries, poor circulation  . Ovarian cancer Sister 18    stage IV, now 19    PHYSICAL EXAM: Filed Vitals:   09/21/13 0849  BP: 118/68  Pulse: 102  Weight: 161 lb 1.9 oz (73.084 kg)  SpO2: 94%   General: NAD HEENT: normal Neck: supple. JVP 7-8 cm. Carotids 2+ bilaterally; no bruits. No lymphadenopathy or thryomegaly appreciated. Cor: PMI normal. Regular rate & rhythm. No rubs, gallops.  1/6 HSM LLSB. Mildly increased P2 no RV lift  Lungs: clear 2 liters [place], no wheezing or crackles Abdomen: soft, nontender, nondistended. No hepatosplenomegaly. No bruits or masses. Good bowel sounds. Extremities: no cyanosis, clubbing, rash, edema  Neuro: alert & orientedx3, cranial nerves grossly intact. Moves all 4 extremities w/o difficulty. Affect pleasant.  ASSESSMENT & PLAN: 1. Atrial fibrillation: Paroxysmal.  Patient is in NSR today after recent DCCV.  Given the size of her atria, recurrence is a significant risk.  Could consider admission for Tikosyn in that case.   - Continue apixaban.  - Continue bisoprolol and diltiazem CD.  2. Pulmonary arterial hypertension: PVR was only 3.1 WU on last RHC. The [place] may be primarily Who Group 3 in the setting of OSA and chronic respiratory failure (on home oxygen, ?OHS).  - Continue OSA at night and oxygen during the day.  - If symptoms do not improve over time,  consider repeat RHC.   3. Chronic diastolic CHF: Volume much better, weight down 6 lbs. Still NYHA class III symptoms.    - Continue Lasix 80 qam/40 qpm and KCl 40 bid.  - BMET/BNP today.  - Followup in 1 month.  4. Asthma: Recent exacerbation.  No wheezing on exam, completed prednisone and antibiotics.  5. Mitral valve disease: Probably mild functional mitral stenosis post-MV repair.   Larey Dresser 09/21/2013

## 2013-09-21 NOTE — Telephone Encounter (Signed)
Gave pt appt for lab on July and October 2015

## 2013-09-21 NOTE — Telephone Encounter (Signed)
Message copied by Brien Few on Mon Sep 21, 2013  5:03 PM ------      Message from: [name] B      Created: Mon Sep 21, 2013  4:25 PM       Please call patient, ferritin slightly higher, may be related to recent infection, f/u as scheduled for lab 10mo. ------

## 2013-09-21 NOTE — Telephone Encounter (Signed)
Called pt with lab results. Ferritin slightly higher, may be related to recent infection- per Dr. Benay Spice. Follow up in 3 mos for lab. She voiced understanding.

## 2013-09-22 ENCOUNTER — Telehealth (HOSPITAL_COMMUNITY): Payer: Self-pay | Admitting: Cardiology

## 2013-09-22 NOTE — Telephone Encounter (Signed)
Pt scheduled for DCCV- cpt code 92960 icd 9- 427.32 With pts current primary insurance [employer]-  No pre cert req'd   Secondary insurance medicare- no pre cert req'd

## 2013-09-22 NOTE — Addendum Note (Signed)
Encounter addended by: Asencion Gowda, CCT on: 09/22/2013  8:59 AM<BR>     Documentation filed: Charges VN

## 2013-09-25 ENCOUNTER — Ambulatory Visit (HOSPITAL_COMMUNITY): Admission: RE | Admit: 2013-09-25 | Payer: BC Managed Care – PPO | Source: Ambulatory Visit | Admitting: Cardiology

## 2013-09-25 ENCOUNTER — Encounter (HOSPITAL_COMMUNITY): Admission: RE | Payer: Self-pay | Source: Ambulatory Visit

## 2013-09-25 SURGERY — CARDIOVERSION
Anesthesia: Monitor Anesthesia Care

## 2013-10-07 ENCOUNTER — Telehealth (HOSPITAL_COMMUNITY): Payer: Self-pay | Admitting: Cardiology

## 2013-10-07 MED ORDER — AMOXICILLIN 500 MG PO CAPS: 500.0000 mg | ORAL_CAPSULE | ORAL | Status: DC

## 2013-10-07 NOTE — Telephone Encounter (Signed)
Pt previously took abx pre dental procedures Was told by Dr.Turner pre abx was no longer needed pre procedure, never cleared this with Dr.Bensimhon Wanted to clarify with Dr.Bensimhon if abx will be needed post procedure

## 2013-10-07 NOTE — Telephone Encounter (Signed)
Pt with history of valve repair, antibiotics sent in pt's husband aware

## 2013-10-13 ENCOUNTER — Ambulatory Visit (INDEPENDENT_AMBULATORY_CARE_PROVIDER_SITE_OTHER): Payer: BC Managed Care – PPO | Admitting: Pulmonary Disease

## 2013-10-13 ENCOUNTER — Encounter: Payer: Self-pay | Admitting: Pulmonary Disease

## 2013-10-13 VITALS — BP 118/68 | HR 83 | Temp 98.0°F | Ht 62.5 in | Wt 168.4 lb

## 2013-10-13 DIAGNOSIS — R0602 Shortness of breath: Secondary | ICD-10-CM | POA: Diagnosis not present

## 2013-10-13 DIAGNOSIS — J45909 Unspecified asthma, uncomplicated: Secondary | ICD-10-CM

## 2013-10-13 DIAGNOSIS — I272 Pulmonary hypertension, unspecified: Secondary | ICD-10-CM

## 2013-10-13 NOTE — Assessment & Plan Note (Signed)
The patient is currently doing well from an asthma standpoint on her bronchodilator regimen. I've asked her to continue on the medication, and to continue working on weight loss and conditioning.

## 2013-10-13 NOTE — Progress Notes (Signed)
   Subjective:    Patient ID: [name], female    DOB: [birth year], 63 y.o.   MRN: [id number]  HPI Patient comes in today for followup of her known asthma. She also has underlying cardiac disease, as well as sleep apnea and pulmonary hypertension which are being managed by cardiology. She was recently in the hospital with a flare of her asthma after an acute viral illness, and also had issues with atrial fibrillation with a rapid ventricular response which needed cardioversion. She comes in today where she is much improved, but continues to have significant deconditioning. She is having no chest congestion or wheezing, but she does have mucus collecting in her throat most likely from postnasal drip. She is continuing on her oxygen, and is trying to stay as active as possible.   Review of Systems  Constitutional: Negative for fever and unexpected weight change.  HENT: Negative for congestion, dental problem, ear pain, nosebleeds, postnasal drip, rhinorrhea, sinus pressure, sneezing, sore throat and trouble swallowing.   Eyes: Negative for redness and itching.  Respiratory: Positive for cough and shortness of breath. Negative for chest tightness and wheezing.   Cardiovascular: Negative for palpitations and leg swelling.  Gastrointestinal: Negative for nausea and vomiting.  Genitourinary: Negative for dysuria.  Musculoskeletal: Negative for joint swelling.  Skin: Negative for rash.  Neurological: Negative for headaches.  Hematological: Does not bruise/bleed easily.  Psychiatric/Behavioral: Negative for dysphoric mood. The patient is not nervous/anxious.        Objective:   Physical Exam Overweight female in no acute distress Nose without purulence or discharge noted Neck without lymphadenopathy or thyromegaly Chest with totally clear breath sounds, no wheezes or rhonchi Cardiac exam with questionable slight irregularity, controlled ventricular response, 2/6 systolic murmur Lower  extremities with no significant edema, no cyanosis Alert and oriented, moves all 4 extremities.       Assessment & Plan:

## 2013-10-13 NOTE — Patient Instructions (Addendum)
Continue on your current asthma medications Stay as active as possible, and work on weight reduction. Try taking chlorpheniramine 4mg  2 tabs at bedtime to dry up postnasal drip.  Can take zyrtec 10mg  during the day as well.  followup with me again in 15mos.

## 2013-10-20 NOTE — Progress Notes (Signed)
Patient ID: [name], female   DOB: 11-16-1950, 63 y.o.   MRN: [id number] Pulmonary: Dr [name] PCP: Dr [name]  HPI: [name] is a 63 y/o woman with h/o HTN, pyruvate kinase deficiency with hemolytic anemia, obesity, asthma, OSA (on CPAP), mitral valve prolapse s/p mini MV repair/Maze (2011 with Dr. Roxy [name]), PAF s/p RFA followed by surgical Maze 2229 and diastolic dysfunction.  Pre-op cath with normal coronaries in 2011. 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   Baseline hgb 7-9. Doesn't get transfused unless 6 or below. She returns for follow up.  Overall she feeling much better. She remains on 2 liters continuous oxygen. Weight at home has been 164 pounds. She has not required any No bleeding problems noted. Says she has some edmea but had ham over the weekend. Taking all medications.   [name] 06/24/12 RA = 10  RV = 60/7/12  PA = 58/25 (40)  PCW = 19 (no significant v-waves)  Fick cardiac output/index = 6.6/3.7  PVR = 3.1 [place]  FA sat = 91%  PA sat = 66%, 67% PVR was not significantly elevated so selective pulmonary vasodilators were noted started.   Echo 12/13 EF 60-65% mild LVH. Grade 2 DD. MVR stable (no MS noted). Moderate LAE. RV mildly dilated. Mod-severe TR with estimated RVSP 80-90 range (in 2011 was 40-45 mm HG).  ECHO 10/22/12 EF 55% RV mildly decreased + Septal bounce. Moderate TR. RVSP 65-70. +diastolic dysfunction (likely restrictive - but tissue Doppler not performed).   TEE (4/15): EF 60-65%, severe bilateral atrial enlargement, s/p MV ring with no MR and mild mitral stenosis with meean gradient 6, MVA 1.7 cm^2.  PASP on TTE in 4/15 was 60 mmHg.   Labs (7/14): Potassium 4.6 BUN 16 Creatinine 0.68 hemoglobin 9, LDL 63 Labs (4/15): K 4.3, creatinine 0.9, HCT 26.5 Labs 09/21/13:  K 3.5  Labs 10/21/13 K 4.1 Creatinine 0.76  ROS: All systems negative except as listed in HPI, PMH and Problem List.  Past Medical History  Diagnosis Date  .  Pyruvate kinase deficiency hemolytic anemia   . Asthma   . Syncope and collapse 2012    r/t anemia  . Anemia   . Dyspnea   . Mitral regurgitation     s/p MV repair complicated by diaphragmatic paralysis  . Fatigue   . Myoclonus   . Heart murmur     severe MR s/p MV repair  . GERD (gastroesophageal reflux disease)   . Allergic rhinitis   . Depression   . Gastritis     NSAID induced  . Asthma   . Hx of asplenia     surgical  . Osteoporosis   . Trochanteric bursitis     left hip  . Pulmonary HTN     mild to moderate by [place] 05/2012  . Exercise hypoxemia     supplemental O2 PRN  . Hypertension   . Atrial fibrillation     s/p RFA and Maze procedure - resolved  . Atrial tachycardia     Current Outpatient Prescriptions  Medication Sig Dispense Refill  . acetaminophen (TYLENOL) 500 MG tablet Take 1,000 mg by mouth every 6 (six) hours as needed for mild pain or fever.      Marland Kitchen apixaban (ELIQUIS) 5 MG TABS tablet Take 1 tablet (5 mg total) by mouth 2 (two) times daily.  60 tablet  6  . bisoprolol (ZEBETA) 5 MG tablet Take 1 tablet (5 mg total)  by mouth 2 (two) times daily.  30 tablet  3  . budesonide-formoterol (SYMBICORT) 160-4.5 MCG/ACT inhaler Inhale 2 puffs into the lungs 2 (two) times daily.      . calcium carbonate (OS-CAL) 600 MG TABS Take 600 mg by mouth 2 (two) times daily with a meal.      . cholecalciferol (VITAMIN D) 1000 UNITS tablet Take 1,000 Units by mouth daily.      . clonazePAM (KLONOPIN) 1 MG tablet Take 1 mg by mouth at bedtime as needed.       . deferasirox (EXJADE) 500 MG disintegrating tablet Take 3 tablets (1,500 mg total) by mouth daily with supper. Take 3 tablets once daily 30 minutes prior to largest meal  90 tablet  11  . diltiazem (CARDIZEM CD) 240 MG 24 hr capsule Take 1 capsule (240 mg total) by mouth daily.  30 capsule  3  . EPINEPHrine (EPI-PEN) 0.3 mg/0.3 mL DEVI Inject 0.3 mg into the muscle once as needed.      . Eszopiclone 3 MG TABS Take 3 mg by  mouth at bedtime as needed (sleep).       . fexofenadine (ALLEGRA) 180 MG tablet Take 180 mg by mouth at bedtime.       . folic acid (FOLVITE) 1 MG tablet Take 1 mg by mouth daily.      Marland Kitchen guaiFENesin (MUCINEX) 600 MG 12 hr tablet Take 600 mg by mouth 2 (two) times daily as needed for to loosen phlegm.       . mometasone (NASONEX) 50 MCG/ACT nasal spray Place 1 spray into both nostrils at bedtime.       . Multiple Vitamins-Minerals (AIRBORNE) CHEW Chew 2 tablets by mouth 2 (two) times daily.      . NON FORMULARY Inhale 2 L into the lungs continuous. Oxygen at 2 l/min [place]      . pantoprazole (PROTONIX) 40 MG tablet Take 40 mg by mouth daily.      . potassium chloride SA (K-DUR,KLOR-CON) 20 MEQ tablet Take 2 tablets (40 mEq total) by mouth 2 (two) times daily.  120 tablet  3  . Probiotic Product (ALIGN) 4 MG CAPS Take 1 tablet by mouth daily.      . raloxifene (EVISTA) 60 MG tablet Take 60 mg by mouth daily.      . sodium chloride (OCEAN) 0.65 % nasal spray Place 2 sprays into both nostrils 2 (two) times daily.       . traMADol (ULTRAM) 50 MG tablet Take 50 mg by mouth 3 (three) times daily.       Marland Kitchen UNABLE TO FIND Med Name: CPAP      . furosemide (LASIX) 80 MG tablet Take 80 mg by mouth. 80mg  every am and 40mg  in pm       No current facility-administered medications for this encounter.   History  Substance Use Topics  . Smoking status: Never Smoker   . Smokeless tobacco: Never Used  . Alcohol Use: Yes     Comment: socially glass red wine monthly   Family History  Problem Relation Age of Onset  . Breast cancer Mother 10    ER+  . Melanoma Father 2  . Adrenal disorder Sister     pheochromacytoma - rt. adrenal gland  . COPD Maternal Aunt   . Stomach cancer Paternal Uncle     onset in 5s  . Lung cancer Maternal Grandmother     died late 8s, heavy smoker  .  Heart disease Maternal Grandfather     hardening of the arteries, poor circulation  . Ovarian cancer Sister 8    stage IV, now  2    PHYSICAL EXAM: Filed Vitals:   10/21/13 0935  BP: 104/78  Pulse: 71  Resp: 20  Weight: 165 lb 6 oz (75.014 kg)  SpO2: 92%   General: NAD HEENT: normal Neck: supple. JVP 7-8 cm. Carotids 2+ bilaterally; no bruits. No lymphadenopathy or thryomegaly appreciated. Cor: PMI normal. Regular rate & rhythm. No rubs, gallops.  1/6 HSM LLSB. Mildly increased P2 no RV lift  Lungs: clear 2 liters [place], no wheezing or crackles Abdomen: soft, nontender, nondistended. No hepatosplenomegaly. No bruits or masses. Good bowel sounds. Extremities: no cyanosis, clubbing, rash, edema  Neuro: alert & orientedx3, cranial nerves grossly intact. Moves all 4 extremities w/o difficulty. Affect pleasant.  ASSESSMENT & PLAN: 1. Atrial fibrillation: Paroxysmal.  Patient is in NSR today after recent DCCV.  Given the size of her atria, recurrence is a significant risk.  Could consider admission for Tikosyn in that case.  Regualr rate on exam.  Continue apixaban. No bleeding problems noted.   - Continue bisoprolol and diltiazem CD.  2. Pulmonary arterial hypertension: PVR was only 3.1 WU on last RHC. The [place] may be primarily Who Group 3 in the setting of OSA and chronic respiratory failure (on home oxygen, ?OHS).  - Continue OSA at night and oxygen during the day. Will need ^MW at next vist.   3. Chronic diastolic CHF: Volume status stable. - Continue Lasix 80 qam/40 qpm and KCl 40 bid.  Instructed to take an additional 40 mg of lasix for weight 167 pounds or greater with 20 meq of potassium.   4. Asthma: Recent exacerbation.  Per Dr [name].   Follow up in 3 months with Dr Haroldine Laws   Amy D Clegg NP-C  10/21/2013

## 2013-10-21 ENCOUNTER — Ambulatory Visit (HOSPITAL_COMMUNITY)
Admission: RE | Admit: 2013-10-21 | Discharge: 2013-10-21 | Disposition: A | Discharge status: Home / Self Care | Payer: Medicare Other | Source: Ambulatory Visit | Attending: Internal Medicine | Admitting: Internal Medicine

## 2013-10-21 ENCOUNTER — Encounter (HOSPITAL_COMMUNITY): Payer: Self-pay

## 2013-10-21 VITALS — BP 104/78 | HR 71 | Resp 20 | Wt 165.4 lb

## 2013-10-21 DIAGNOSIS — I2789 Other specified pulmonary heart diseases: Secondary | ICD-10-CM | POA: Diagnosis not present

## 2013-10-21 DIAGNOSIS — K219 Gastro-esophageal reflux disease without esophagitis: Secondary | ICD-10-CM | POA: Insufficient documentation

## 2013-10-21 DIAGNOSIS — I10 Essential (primary) hypertension: Secondary | ICD-10-CM | POA: Diagnosis not present

## 2013-10-21 DIAGNOSIS — Z7901 Long term (current) use of anticoagulants: Secondary | ICD-10-CM | POA: Diagnosis not present

## 2013-10-21 DIAGNOSIS — J9610 Chronic respiratory failure, unspecified whether with hypoxia or hypercapnia: Secondary | ICD-10-CM | POA: Diagnosis not present

## 2013-10-21 DIAGNOSIS — M810 Age-related osteoporosis without current pathological fracture: Secondary | ICD-10-CM | POA: Insufficient documentation

## 2013-10-21 DIAGNOSIS — G4733 Obstructive sleep apnea (adult) (pediatric): Secondary | ICD-10-CM | POA: Diagnosis not present

## 2013-10-21 DIAGNOSIS — F329 Major depressive disorder, single episode, unspecified: Secondary | ICD-10-CM | POA: Insufficient documentation

## 2013-10-21 DIAGNOSIS — I4891 Unspecified atrial fibrillation: Secondary | ICD-10-CM | POA: Diagnosis not present

## 2013-10-21 DIAGNOSIS — I5032 Chronic diastolic (congestive) heart failure: Secondary | ICD-10-CM | POA: Insufficient documentation

## 2013-10-21 DIAGNOSIS — I509 Heart failure, unspecified: Secondary | ICD-10-CM | POA: Diagnosis not present

## 2013-10-21 DIAGNOSIS — D559 Anemia due to enzyme disorder, unspecified: Secondary | ICD-10-CM | POA: Insufficient documentation

## 2013-10-21 DIAGNOSIS — F3289 Other specified depressive episodes: Secondary | ICD-10-CM | POA: Insufficient documentation

## 2013-10-21 DIAGNOSIS — J45909 Unspecified asthma, uncomplicated: Secondary | ICD-10-CM | POA: Diagnosis not present

## 2013-10-21 DIAGNOSIS — Z9981 Dependence on supplemental oxygen: Secondary | ICD-10-CM | POA: Insufficient documentation

## 2013-10-21 DIAGNOSIS — I272 Pulmonary hypertension, unspecified: Secondary | ICD-10-CM

## 2013-10-21 LAB — BASIC METABOLIC PANEL
BUN: 15 mg/dL (ref 6–23)
CALCIUM: 9.8 mg/dL (ref 8.4–10.5)
CO2: 28 meq/L (ref 19–32)
Chloride: 100 mEq/L (ref 96–112)
Creatinine, Ser: 0.76 mg/dL (ref 0.50–1.10)
GFR calc Af Amer: >90 mL/min (ref >90)
GFR calc non Af Amer: 87 mL/min — ABNORMAL LOW (ref >90)
GLUCOSE: 90 mg/dL (ref 70–99)
Potassium: 4.1 mEq/L (ref 3.7–5.3)
Sodium: 140 mEq/L (ref 137–147)

## 2013-10-21 NOTE — Patient Instructions (Signed)
Follow up in 3 months with Dr Haroldine Laws.    Take and extra 40 mg of lasix if your weight is 167 pounds or greater with and extra 20 meq of potassium  Do the following things EVERYDAY: 1) Weigh yourself in the morning before breakfast. Write it down and keep it in a log. 2) Take your medicines as prescribed 3) Eat low salt foods-Limit salt (sodium) to 2000 mg per day.  4) Stay as active as you can everyday 5) Limit all fluids for the day to less than 2 liters

## 2013-10-28 ENCOUNTER — Telehealth: Payer: Self-pay | Admitting: Pulmonary Disease

## 2013-10-28 NOTE — Telephone Encounter (Signed)
Pt returned call.  Spoke with patient who reported that she is aware that [facility] has some POC models but the model they carry is too heavy for her (unable to do the 10# POC).  She looked into [employer] who is not in netwoek and is aware that she could outright buy a concentrator from this company but their models to do not offer a continuous flow so she would actually need to purchase 2 (a pulsed flow POC for activity and a continuous flow for QHS w/ CPAP).  Pt asked for some additional information on other DME/companies that offer POCs.  Provided pt with the following names to call: [facility], [facility], [place], [facility], [employer], [employer]  Pt will look into these and call us back with the company/POC that she chooses.  Nothing further needed at this time; will sign off.

## 2013-10-28 NOTE — Telephone Encounter (Signed)
Pt seen by [facility] 5.19.15 Currently receives O2 thru [facility]  [facility] does do some portable concentrators - what type or specifications is pt looking for? LMOM TCB x1

## 2013-12-01 ENCOUNTER — Ambulatory Visit: Payer: BC Managed Care – PPO | Admitting: Pulmonary Disease

## 2013-12-02 ENCOUNTER — Telehealth (HOSPITAL_COMMUNITY): Payer: Self-pay | Admitting: Vascular Surgery

## 2013-12-02 NOTE — Telephone Encounter (Signed)
Left pt message.. Change appt time from 9 to 10:20

## 2013-12-17 ENCOUNTER — Telehealth: Payer: Self-pay | Admitting: Pulmonary Disease

## 2013-12-17 NOTE — Telephone Encounter (Signed)
Pt aware that Dr Annamaria Boots has [employer] Rx and is going to sign it in Dr Janifer Adie absence.  Pt aware that this will be faxed back as soon as sign. Nothing further needed.

## 2013-12-17 NOTE — Telephone Encounter (Signed)
[name] is aware and nothing further needed

## 2013-12-17 NOTE — Telephone Encounter (Signed)
Done and in Katie's fax box.

## 2013-12-17 NOTE — Telephone Encounter (Signed)
Please let them know form has been faxed back. Thanks.

## 2013-12-17 NOTE — Telephone Encounter (Signed)
Spoke with April from [place] Rx request faxed for O2 12/16/13-- faxed to [phone] [facility] patient--aware that [facility] not back in office until Dec 28, 2013 Spoke with Dr Annamaria Boots and received an okay for name to be change on [employer] Rx to reflect Dr Janee Morn signature in Dr Janifer Adie absence.  Form to be faxed to triage and will be given to Dr Annamaria Boots to fill out and sign.  Rx is for POC and at home Concentrator--both flow volumes need to be circled.  Most recent qualifying saturations: Lilli Few, CMA 10/13/2013 10:20 AM  DME-[facility]  2 liters 24/7 bled through CPAP at night  Pt. Oxygen level at 82% RA (pt. Was unaware O2 was cut off). After O2 placed on 2L sat 93%.   Please advise Dr Annamaria Boots. Thanks.

## 2013-12-17 NOTE — Telephone Encounter (Signed)
Called April and LMTCB x1

## 2013-12-22 ENCOUNTER — Other Ambulatory Visit (HOSPITAL_COMMUNITY): Payer: Self-pay

## 2013-12-22 MED ORDER — BISOPROLOL FUMARATE 5 MG PO TABS: 5.0000 mg | ORAL_TABLET | Freq: Two times a day (BID) | ORAL | Status: DC

## 2013-12-23 ENCOUNTER — Other Ambulatory Visit (HOSPITAL_BASED_OUTPATIENT_CLINIC_OR_DEPARTMENT_OTHER): Payer: BC Managed Care – PPO

## 2013-12-23 DIAGNOSIS — D559 Anemia due to enzyme disorder, unspecified: Secondary | ICD-10-CM

## 2013-12-23 DIAGNOSIS — D649 Anemia, unspecified: Secondary | ICD-10-CM

## 2013-12-23 DIAGNOSIS — D594 Other nonautoimmune hemolytic anemias: Secondary | ICD-10-CM

## 2013-12-23 LAB — FERRITIN CHCC: Ferritin: 33 ng/ml (ref 9–269)

## 2013-12-23 LAB — CBC WITH DIFFERENTIAL/PLATELET
BASO%: 1.5 % (ref 0.0–2.0)
Basophils Absolute: 0.1 10*3/uL (ref 0.0–0.1)
EOS ABS: 0.4 10*3/uL (ref 0.0–0.5)
EOS%: 4.9 % (ref 0.0–7.0)
HCT: 28.5 % — ABNORMAL LOW (ref 34.8–46.6)
HEMOGLOBIN: 9.1 g/dL — AB (ref 11.6–15.9)
LYMPH%: 25.1 % (ref 14.0–49.7)
MCH: 34.9 pg — ABNORMAL HIGH (ref 25.1–34.0)
MCHC: 31.8 g/dL (ref 31.5–36.0)
MCV: 109.7 fL — AB (ref 79.5–101.0)
MONO#: 1.8 10*3/uL — ABNORMAL HIGH (ref 0.1–0.9)
MONO%: 19.7 % — AB (ref 0.0–14.0)
NEUT%: 48.8 % (ref 38.4–76.8)
NEUTROS ABS: 4.4 10*3/uL (ref 1.5–6.5)
Platelets: 471 10*3/uL — ABNORMAL HIGH (ref 145–400)
RBC: 2.6 10*6/uL — AB (ref 3.70–5.45)
RDW: 16.6 % — ABNORMAL HIGH (ref 11.2–14.5)
WBC: 9 10*3/uL (ref 3.9–10.3)
lymph#: 2.3 10*3/uL (ref 0.9–3.3)

## 2013-12-25 ENCOUNTER — Telehealth: Payer: Self-pay | Admitting: *Deleted

## 2013-12-25 NOTE — Telephone Encounter (Signed)
Message copied by Tania Ade on Fri Dec 25, 2013  4:39 PM ------      Message from: [name] B      Created: Wed Dec 23, 2013  7:23 PM       Please call patient, hb and ferritin are ok ------

## 2013-12-25 NOTE — Telephone Encounter (Signed)
Left CBC (Hgb) results and ferritin result on her home voice mail.

## 2013-12-29 ENCOUNTER — Telehealth: Payer: Self-pay | Admitting: Pulmonary Disease

## 2013-12-29 DIAGNOSIS — R0602 Shortness of breath: Secondary | ICD-10-CM

## 2013-12-29 NOTE — Telephone Encounter (Signed)
Called spoke with pt. She has already received everything from [employer] and everything works fine. She needs an order sent to [facility] to pick up all their equipment. Order sent. Nothing further needed

## 2013-12-31 ENCOUNTER — Telehealth (HOSPITAL_COMMUNITY): Payer: Self-pay | Admitting: Vascular Surgery

## 2013-12-31 DIAGNOSIS — I5032 Chronic diastolic (congestive) heart failure: Secondary | ICD-10-CM

## 2013-12-31 NOTE — Telephone Encounter (Addendum)
Pt need a refill Diltiazem .Marland Kitchen Pt called again leaving to go out town tomorrow.. Please advise

## 2014-01-04 MED ORDER — DILTIAZEM HCL ER COATED BEADS 240 MG PO CP24: 240.0000 mg | ORAL_CAPSULE | Freq: Every day | ORAL | Status: DC

## 2014-01-20 ENCOUNTER — Ambulatory Visit (HOSPITAL_COMMUNITY)
Admission: RE | Admit: 2014-01-20 | Discharge: 2014-01-20 | Disposition: A | Discharge status: Home / Self Care | Payer: BC Managed Care – PPO | Source: Ambulatory Visit | Attending: Internal Medicine | Admitting: Internal Medicine

## 2014-01-20 ENCOUNTER — Encounter (HOSPITAL_COMMUNITY): Payer: Self-pay

## 2014-01-20 VITALS — BP 124/82 | HR 77 | Wt 163.8 lb

## 2014-01-20 DIAGNOSIS — Z9981 Dependence on supplemental oxygen: Secondary | ICD-10-CM | POA: Diagnosis not present

## 2014-01-20 DIAGNOSIS — G4733 Obstructive sleep apnea (adult) (pediatric): Secondary | ICD-10-CM | POA: Diagnosis not present

## 2014-01-20 DIAGNOSIS — F329 Major depressive disorder, single episode, unspecified: Secondary | ICD-10-CM | POA: Diagnosis not present

## 2014-01-20 DIAGNOSIS — Z8249 Family history of ischemic heart disease and other diseases of the circulatory system: Secondary | ICD-10-CM | POA: Diagnosis not present

## 2014-01-20 DIAGNOSIS — J45909 Unspecified asthma, uncomplicated: Secondary | ICD-10-CM | POA: Insufficient documentation

## 2014-01-20 DIAGNOSIS — I079 Rheumatic tricuspid valve disease, unspecified: Secondary | ICD-10-CM | POA: Diagnosis not present

## 2014-01-20 DIAGNOSIS — I498 Other specified cardiac arrhythmias: Secondary | ICD-10-CM | POA: Diagnosis not present

## 2014-01-20 DIAGNOSIS — K219 Gastro-esophageal reflux disease without esophagitis: Secondary | ICD-10-CM | POA: Insufficient documentation

## 2014-01-20 DIAGNOSIS — I5032 Chronic diastolic (congestive) heart failure: Secondary | ICD-10-CM

## 2014-01-20 DIAGNOSIS — F3289 Other specified depressive episodes: Secondary | ICD-10-CM | POA: Insufficient documentation

## 2014-01-20 DIAGNOSIS — E669 Obesity, unspecified: Secondary | ICD-10-CM | POA: Diagnosis not present

## 2014-01-20 DIAGNOSIS — Z9889 Other specified postprocedural states: Secondary | ICD-10-CM | POA: Insufficient documentation

## 2014-01-20 DIAGNOSIS — D559 Anemia due to enzyme disorder, unspecified: Secondary | ICD-10-CM | POA: Diagnosis not present

## 2014-01-20 DIAGNOSIS — I340 Nonrheumatic mitral (valve) insufficiency: Secondary | ICD-10-CM

## 2014-01-20 DIAGNOSIS — M810 Age-related osteoporosis without current pathological fracture: Secondary | ICD-10-CM | POA: Diagnosis not present

## 2014-01-20 DIAGNOSIS — I059 Rheumatic mitral valve disease, unspecified: Secondary | ICD-10-CM | POA: Insufficient documentation

## 2014-01-20 DIAGNOSIS — I2789 Other specified pulmonary heart diseases: Secondary | ICD-10-CM | POA: Diagnosis not present

## 2014-01-20 DIAGNOSIS — I509 Heart failure, unspecified: Secondary | ICD-10-CM | POA: Diagnosis not present

## 2014-01-20 DIAGNOSIS — J309 Allergic rhinitis, unspecified: Secondary | ICD-10-CM | POA: Diagnosis not present

## 2014-01-20 DIAGNOSIS — M76899 Other specified enthesopathies of unspecified lower limb, excluding foot: Secondary | ICD-10-CM | POA: Insufficient documentation

## 2014-01-20 DIAGNOSIS — I4891 Unspecified atrial fibrillation: Secondary | ICD-10-CM | POA: Diagnosis present

## 2014-01-20 DIAGNOSIS — G253 Myoclonus: Secondary | ICD-10-CM | POA: Insufficient documentation

## 2014-01-20 DIAGNOSIS — I272 Pulmonary hypertension, unspecified: Secondary | ICD-10-CM

## 2014-01-20 NOTE — Addendum Note (Signed)
Encounter addended by: Scarlette Calico, RN on: 01/20/2014 11:05 AM<BR>     Documentation filed: Visit Diagnoses, Orders

## 2014-01-20 NOTE — Addendum Note (Signed)
Encounter addended by: Scarlette Calico, RN on: 01/20/2014 11:18 AM<BR>     Documentation filed: Orders, Patient Instructions Section

## 2014-01-20 NOTE — Patient Instructions (Signed)
Your physician recommends that you schedule a follow-up appointment in: 4 months with an echocardiogram 

## 2014-01-20 NOTE — Progress Notes (Signed)
Patient ID: [name], female   DOB: May 19, 1951, 63 y.o.   MRN: [id number] Pulmonary: Dr Gwenette Greet PCP: Dr Inda Merlin   HPI: [name] is a 63 y/o woman with h/o HTN, pyruvate kinase deficiency with hemolytic anemia, obesity, asthma, OSA (on CPAP), mitral valve prolapse s/p mini MV repair/Maze (2011 with Dr. [name]), PAF s/p RFA followed by surgical Maze 1829 and diastolic dysfunction.  Pre-op cath with normal coronaries in 2011. 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   Baseline hgb 7-9. Doesn't get transfused unless 6 or below.  [place] 06/24/12 RA = 10  RV = 60/7/12  PA = 58/25 (40)  PCW = 19 (no significant v-waves)  Fick cardiac output/index = 6.6/3.7  PVR = 3.1 [place]  FA sat = 91%  PA sat = 66%, 67% PVR was not significantly elevated so selective pulmonary vasodilators were noted started.   Echo 12/13 EF 60-65% mild LVH. Grade 2 DD. MVR stable (no MS noted). Moderate LAE. RV mildly dilated. Mod-severe TR with estimated RVSP 80-90 range (in 2011 was 40-45 mm HG).  ECHO 10/22/12 EF 55% RV mildly decreased + Septal bounce. Moderate TR. RVSP 65-70. +diastolic dysfunction (likely restrictive - but tissue Doppler not performed).   TEE (4/15): EF 60-65%, severe bilateral atrial enlargement, s/p MV ring with no MR and mild mitral stenosis with meean gradient 6, MVA 1.7 cm^2.  PASP on TTE in 4/15 was 60 mmHg.   Follow-up: She returns for follow up.  Overall she feeling much better. She remains on 2 liters continuous oxygen. Weight at home has been stable at 164 pounds. Takes lasix 80/40. Occasionally increases to 80/80. Has epistaxis from L nare about 1x/week. Taking all medications. Blood work followed by Dr. Benay Spice. No palpitations. Walks around [employer] regularly but doesn't exercise regularly.    Labs (7/14): Potassium 4.6 BUN 16 Creatinine 0.68 hemoglobin 9, LDL 63 Labs (4/15): K 4.3, creatinine 0.9, HCT 26.5 Labs 09/21/13:  K 3.5  Labs 10/21/13 K 4.1 Creatinine  0.76 Labs 7/15: Hgb 9.1  ROS: All systems negative except as listed in HPI, PMH and Problem List.  Past Medical History  Diagnosis Date  . Pyruvate kinase deficiency hemolytic anemia   . Asthma   . Syncope and collapse 2012    r/t anemia  . Anemia   . Dyspnea   . Mitral regurgitation     s/p MV repair complicated by diaphragmatic paralysis  . Fatigue   . Myoclonus   . Heart murmur     severe MR s/p MV repair  . GERD (gastroesophageal reflux disease)   . Allergic rhinitis   . Depression   . Gastritis     NSAID induced  . Asthma   . Hx of asplenia     surgical  . Osteoporosis   . Trochanteric bursitis     left hip  . Pulmonary HTN     mild to moderate by [place] 05/2012  . Exercise hypoxemia     supplemental O2 PRN  . Hypertension   . Atrial fibrillation     s/p RFA and Maze procedure - resolved  . Atrial tachycardia     Current Outpatient Prescriptions  Medication Sig Dispense Refill  . acetaminophen (TYLENOL) 500 MG tablet Take 1,000 mg by mouth every 6 (six) hours as needed for mild pain or fever.      Marland Kitchen apixaban (ELIQUIS) 5 MG TABS tablet Take 1 tablet (5 mg total) by mouth 2 (two)  times daily.  60 tablet  6  . bisoprolol (ZEBETA) 5 MG tablet Take 1 tablet (5 mg total) by mouth 2 (two) times daily.  30 tablet  3  . budesonide-formoterol (SYMBICORT) 160-4.5 MCG/ACT inhaler Inhale 2 puffs into the lungs 2 (two) times daily.      . calcium carbonate (OS-CAL) 600 MG TABS Take 600 mg by mouth 2 (two) times daily with a meal.      . cholecalciferol (VITAMIN D) 1000 UNITS tablet Take 1,000 Units by mouth daily.      . clonazePAM (KLONOPIN) 1 MG tablet Take 1 mg by mouth at bedtime as needed.       . deferasirox (EXJADE) 500 MG disintegrating tablet Take 3 tablets (1,500 mg total) by mouth daily with supper. Take 3 tablets once daily 30 minutes prior to largest meal  90 tablet  11  . diltiazem (CARDIZEM CD) 240 MG 24 hr capsule Take 1 capsule (240 mg total) by mouth daily.  30  capsule  3  . EPINEPHrine (EPI-PEN) 0.3 mg/0.3 mL DEVI Inject 0.3 mg into the muscle once as needed.      . Eszopiclone 3 MG TABS Take 3 mg by mouth at bedtime as needed (sleep).       . fexofenadine (ALLEGRA) 180 MG tablet Take 180 mg by mouth at bedtime.       . folic acid (FOLVITE) 1 MG tablet Take 1 mg by mouth daily.      . furosemide (LASIX) 80 MG tablet Take 80 mg by mouth. 80mg  every am and 40mg  in pm      . guaiFENesin (MUCINEX) 600 MG 12 hr tablet Take 600 mg by mouth 2 (two) times daily as needed for to loosen phlegm.       . mometasone (NASONEX) 50 MCG/ACT nasal spray Place 1 spray into both nostrils at bedtime.       . Multiple Vitamins-Minerals (AIRBORNE) CHEW Chew 2 tablets by mouth 2 (two) times daily.      . NON FORMULARY Inhale 2 L into the lungs continuous. Oxygen at 2 l/min [place]      . pantoprazole (PROTONIX) 40 MG tablet Take 40 mg by mouth daily.      . potassium chloride SA (K-DUR,KLOR-CON) 20 MEQ tablet Take 2 tablets (40 mEq total) by mouth 2 (two) times daily.  120 tablet  3  . Probiotic Product (ALIGN) 4 MG CAPS Take 1 tablet by mouth daily.      . raloxifene (EVISTA) 60 MG tablet Take 60 mg by mouth daily.      . sodium chloride (OCEAN) 0.65 % nasal spray Place 2 sprays into both nostrils 2 (two) times daily.       . traMADol (ULTRAM) 50 MG tablet Take 50 mg by mouth 3 (three) times daily.       Marland Kitchen UNABLE TO FIND Med Name: CPAP       No current facility-administered medications for this encounter.   History  Substance Use Topics  . Smoking status: Never Smoker   . Smokeless tobacco: Never Used  . Alcohol Use: Yes     Comment: socially glass red wine monthly   Family History  Problem Relation Age of Onset  . Breast cancer Mother 34    ER+  . Melanoma Father 73  . Adrenal disorder Sister     pheochromacytoma - rt. adrenal gland  . COPD Maternal Aunt   . Stomach cancer Paternal Uncle  onset in 41s  . Lung cancer Maternal Grandmother     died late 35s,  heavy smoker  . Heart disease Maternal Grandfather     hardening of the arteries, poor circulation  . Ovarian cancer Sister 57    stage IV, now 45    PHYSICAL EXAM: Filed Vitals:   01/20/14 1037  BP: 124/82  Pulse: 77  Weight: 163 lb 12.8 oz (74.299 kg)  SpO2: 96%   General: NAD wearing O2 HEENT: normal Neck: supple. JVP 7-8 cm. Carotids 2+ bilaterally; no bruits. No lymphadenopathy or thryomegaly appreciated. Cor: PMI normal. Regular rate & rhythm. No rubs, gallops.  2/6 HSM LLSB. Mildly increased P2 no RV lift  Lungs: clear 2 liters [place], no wheezing or crackles Abdomen: soft, nontender, nondistended. No hepatosplenomegaly. No bruits or masses. Good bowel sounds. Extremities: no cyanosis, clubbing, rash, edema  Neuro: alert & orientedx3, cranial nerves grossly intact. Moves all 4 extremities w/o difficulty. Affect pleasant.  ASSESSMENT & PLAN: 1. Atrial fibrillation: Paroxysmal.  Maintaining NSR.  Given the size of her atria, recurrence is a significant risk.  Could consider admission for Tikosyn in that case.  Continue apixaban. No bleeding problems noted.   - Continue bisoprolol and diltiazem CD.  - If epistaxis continue will send to ENT for cauterization. 2. Pulmonary arterial hypertension: PVR was only 3.1 WU on last RHC. The [place] may be primarily Who Group 3 in the setting of OSA and chronic respiratory failure (on home oxygen, ?OHS).  - Continue OSA at night and oxygen during the day.  - Repeat echo next visit to make sure PA pressures are stable - Encouraged her to walk 30 mins 3-4x/week 3. Chronic diastolic CHF: Volume status stable. - Continue Lasix 80 qam/40 qpm and KCl 40 bid.  Reinforced need for daily weights and reviewed use of sliding scale diuretics. 4. Asthma: Stable. Per Dr Gwenette Greet.   Follow up in 4 months  Genevive Bi 01/20/2014

## 2014-01-26 ENCOUNTER — Telehealth: Payer: Self-pay | Admitting: Pulmonary Disease

## 2014-01-26 DIAGNOSIS — G4733 Obstructive sleep apnea (adult) (pediatric): Secondary | ICD-10-CM

## 2014-01-26 NOTE — Telephone Encounter (Signed)
Per 06/26/12: Pulmonary HTN - Kathee Delton, MD at 06/26/2012 12:22 PM      Status: Written Related Problem: Pulmonary HTN    I agree the patient is not a candidate for vasodilator therapy at this time.  I think she needs to be diuresed, make sure she is wearing CPAP compliantly, and to be complete I will check an overnight oximetry with her on CPAP to ensure that her oxygen levels are adequate  --  Called spoke with pt. She is needing new CPAP supplies. She called her cardiologists and was told she needed to call us.  Please advise [facility] thanks

## 2014-01-26 NOTE — Telephone Encounter (Signed)
I have never seen her for sleep apnea, and it clearly shows in the notes that Dr. Fransico Him has been managing this.  She needs to get her supplies from the physician that has been managing her sleep apnea.

## 2014-01-27 NOTE — Telephone Encounter (Signed)
lmomtcb x1 

## 2014-01-28 NOTE — Telephone Encounter (Signed)
LMTCB

## 2014-02-02 NOTE — Telephone Encounter (Signed)
lmtcb for pt.  

## 2014-02-03 NOTE — Telephone Encounter (Signed)
LMTCB

## 2014-02-04 NOTE — Telephone Encounter (Signed)
Pt states that Dr Radford Pax refuses to manage sleep apnea and she needs to see Sleep Specialist-- pt care deferred to [place] stated pt need to f/u with Dr Gwenette Greet [facility]).  Dr Gwenette Greet please advise if you are okay to see patient as new Consult Sleep. Thanks.

## 2014-02-04 NOTE — Telephone Encounter (Signed)
Pt returned call-  [phone]

## 2014-02-05 NOTE — Telephone Encounter (Signed)
Spoke with pt and advised of Dr Gwenette Greet recommendations.  Order placed.

## 2014-02-05 NOTE — Telephone Encounter (Signed)
Ok to order pt's supplies and mask to get her taken care of, but do not set up for sleep consult as of yet.  Will discuss with cardiology.

## 2014-02-19 ENCOUNTER — Other Ambulatory Visit (HOSPITAL_COMMUNITY): Payer: Self-pay | Admitting: Internal Medicine

## 2014-02-19 DIAGNOSIS — I509 Heart failure, unspecified: Secondary | ICD-10-CM

## 2014-03-11 ENCOUNTER — Encounter: Payer: Self-pay | Admitting: Pulmonary Disease

## 2014-03-11 ENCOUNTER — Ambulatory Visit (INDEPENDENT_AMBULATORY_CARE_PROVIDER_SITE_OTHER)
Admission: RE | Admit: 2014-03-11 | Discharge: 2014-03-11 | Disposition: A | Discharge status: Home / Self Care | Payer: BC Managed Care – PPO | Source: Ambulatory Visit | Attending: Pulmonary Disease | Admitting: Pulmonary Disease

## 2014-03-11 ENCOUNTER — Ambulatory Visit (INDEPENDENT_AMBULATORY_CARE_PROVIDER_SITE_OTHER): Payer: BC Managed Care – PPO | Admitting: Pulmonary Disease

## 2014-03-11 ENCOUNTER — Encounter (INDEPENDENT_AMBULATORY_CARE_PROVIDER_SITE_OTHER): Payer: Self-pay

## 2014-03-11 VITALS — BP 124/80 | HR 88 | Temp 100.4°F | Ht 62.0 in | Wt 167.2 lb

## 2014-03-11 DIAGNOSIS — J4520 Mild intermittent asthma, uncomplicated: Secondary | ICD-10-CM

## 2014-03-11 DIAGNOSIS — R0602 Shortness of breath: Secondary | ICD-10-CM

## 2014-03-11 DIAGNOSIS — J45901 Unspecified asthma with (acute) exacerbation: Secondary | ICD-10-CM

## 2014-03-11 MED ORDER — LEVOFLOXACIN 750 MG PO TABS: 750.0000 mg | ORAL_TABLET | Freq: Every day | ORAL | Status: DC

## 2014-03-11 MED ORDER — PREDNISONE 10 MG PO TABS: ORAL_TABLET | ORAL | Status: DC

## 2014-03-11 NOTE — Progress Notes (Signed)
   Subjective:    Patient ID: [name], female    DOB: [birth year], 63 y.o.   MRN: [id number]  HPI The patient comes in today for an acute sick visit.  She has known asthma for which she is on appropriate bronchodilators, as well as pulmonary hypertension which is being managed by cardiology but requires supplemental oxygen. The patient comes in today with a few day history of increasing nasal congestion with postnasal drip, as well as a cough with the development of purulent mucus. She has some chest congestion, but not overly significant. She has some increased shortness of breath above her baseline, but again it is not overly significant. It is very difficult for her to tell how much of her mucus is coming from her head versus her chest. She did have a temperature greater than 101 today, and has also had some myalgias.   Review of Systems  Constitutional: Positive for fever. Negative for unexpected weight change.  HENT: Positive for congestion, postnasal drip, rhinorrhea and trouble swallowing. Negative for dental problem, ear pain, nosebleeds, sinus pressure, sneezing and sore throat.   Eyes: Negative for redness and itching.  Respiratory: Positive for cough, shortness of breath and wheezing. Negative for chest tightness.   Cardiovascular: Negative for palpitations and leg swelling.  Gastrointestinal: Negative for nausea and vomiting.  Genitourinary: Negative for dysuria.  Musculoskeletal: Negative for joint swelling.  Skin: Negative for rash.  Neurological: Positive for headaches.  Hematological: Does not bruise/bleed easily.  Psychiatric/Behavioral: Negative for dysphoric mood. The patient is not nervous/anxious.        Objective:   Physical Exam Overweight female in no acute distress Nose without purulence or discharge noted Neck without lymphadenopathy or thyromegaly Chest with diffuse rhonchi and a few wheezes, but no crackles Cardiac exam with regular rate and  rhythm Lower extremities without significant edema, no cyanosis Alert and oriented, moves all 4 extremities.       Assessment & Plan:

## 2014-03-11 NOTE — Assessment & Plan Note (Signed)
The patient has bronchospasm on exam today with only mild increased shortness of breath.  It is unclear whether she has a bacterial sinobronchitis, or whether this is simply a viral illness or possibly the flu. I would favor the former, and we'll therefore treat her with a course of antibiotics. We'll check a chest x-ray today to make sure that she is not developing pneumonia and needs a more prolonged course. Will also treat her with a prednisone pulse given her bronchospasm on exam. She understands this may be a viral illness, and that her antibiotics may not contribute much to her improvement

## 2014-03-11 NOTE — Patient Instructions (Signed)
levaquin 750mg  one each am for 7 days. Will check a chest xray, and call you with results. Prednisone taper over 8 days. Please call us if you feel you are not getting better.

## 2014-03-19 ENCOUNTER — Other Ambulatory Visit: Payer: Self-pay | Admitting: Cardiology

## 2014-03-23 ENCOUNTER — Ambulatory Visit: Payer: BC Managed Care – PPO | Admitting: Oncology

## 2014-03-23 ENCOUNTER — Other Ambulatory Visit: Payer: BC Managed Care – PPO

## 2014-03-26 ENCOUNTER — Ambulatory Visit (HOSPITAL_BASED_OUTPATIENT_CLINIC_OR_DEPARTMENT_OTHER): Payer: BC Managed Care – PPO | Admitting: Oncology

## 2014-03-26 ENCOUNTER — Telehealth: Payer: Self-pay | Admitting: Oncology

## 2014-03-26 ENCOUNTER — Telehealth: Payer: Self-pay | Admitting: *Deleted

## 2014-03-26 ENCOUNTER — Other Ambulatory Visit (HOSPITAL_BASED_OUTPATIENT_CLINIC_OR_DEPARTMENT_OTHER): Payer: BC Managed Care – PPO

## 2014-03-26 VITALS — BP 114/52 | HR 70 | Temp 98.4°F | Resp 18 | Ht 62.0 in | Wt 161.3 lb

## 2014-03-26 DIAGNOSIS — M898X9 Other specified disorders of bone, unspecified site: Secondary | ICD-10-CM

## 2014-03-26 DIAGNOSIS — D589 Hereditary hemolytic anemia, unspecified: Secondary | ICD-10-CM

## 2014-03-26 DIAGNOSIS — D649 Anemia, unspecified: Secondary | ICD-10-CM

## 2014-03-26 DIAGNOSIS — D594 Other nonautoimmune hemolytic anemias: Secondary | ICD-10-CM

## 2014-03-26 DIAGNOSIS — Z23 Encounter for immunization: Secondary | ICD-10-CM

## 2014-03-26 DIAGNOSIS — D552 Anemia due to disorders of glycolytic enzymes: Secondary | ICD-10-CM

## 2014-03-26 LAB — CBC WITH DIFFERENTIAL/PLATELET
BASO%: 0.3 % (ref 0.0–2.0)
Basophils Absolute: 0 10*3/uL (ref 0.0–0.1)
EOS%: 4.9 % (ref 0.0–7.0)
Eosinophils Absolute: 0.5 10*3/uL (ref 0.0–0.5)
HCT: 25.3 % — ABNORMAL LOW (ref 34.8–46.6)
HGB: 7.9 g/dL — ABNORMAL LOW (ref 11.6–15.9)
LYMPH%: 29.9 % (ref 14.0–49.7)
MCH: 36.2 pg — ABNORMAL HIGH (ref 25.1–34.0)
MCHC: 31.2 g/dL — AB (ref 31.5–36.0)
MCV: 116.1 fL — AB (ref 79.5–101.0)
MONO#: 2 10*3/uL — AB (ref 0.1–0.9)
MONO%: 18.3 % — ABNORMAL HIGH (ref 0.0–14.0)
NEUT#: 5.1 10*3/uL (ref 1.5–6.5)
NEUT%: 46.6 % (ref 38.4–76.8)
NRBC: 1 % — AB (ref 0–0)
Platelets: 456 10*3/uL — ABNORMAL HIGH (ref 145–400)
RBC: 2.18 10*6/uL — AB (ref 3.70–5.45)
RDW: 16.2 % — ABNORMAL HIGH (ref 11.2–14.5)
WBC: 10.9 10*3/uL — AB (ref 3.9–10.3)
lymph#: 3.3 10*3/uL (ref 0.9–3.3)

## 2014-03-26 LAB — FERRITIN CHCC: Ferritin: 101 ng/ml (ref 9–269)

## 2014-03-26 MED ORDER — PNEUMOCOCCAL 13-VAL CONJ VACC IM SUSP
0.5000 mL | Freq: Once | INTRAMUSCULAR | Status: AC
  Administered 2014-03-26: 0.5 mL via INTRAMUSCULAR
  Filled 2014-03-26: qty 0.5

## 2014-03-26 NOTE — Telephone Encounter (Signed)
Pt confirmed labs/ov per 10/30 POF, gave pt AVS.... KJ °

## 2014-03-26 NOTE — Progress Notes (Signed)
  [facility] OFFICE PROGRESS NOTE   Diagnosis: Hemolytic anemia, iron overload  INTERVAL HISTORY:   [name] returns as scheduled. She continues exjade. She reports having an upper respiratory infection several weeks ago. The respiratory symptoms have improved. She is maintained on nasal cannula oxygen. She is followed closely by Dr. Gwenette [name] and Dr. Haroldine [name]. She reports stable bone pain.  Objective:  Vital signs in last 24 hours:  Blood pressure 114/52, pulse 70, temperature 98.4 F (36.9 C), temperature source Oral, resp. rate 18, height 5\' 2"  (1.575 m), weight 161 lb 4.8 oz (73.165 kg).    Resp: End inspiratory rhonchi at the right base, no respiratory distress, good air movement bilaterally Cardio: Irregular GI: No hepatomegaly, nontender Vascular: No leg edema   Lab Results:  Lab Results  Component Value Date   WBC 10.9* 03/26/2014   HGB 7.9* 03/26/2014   HCT 25.3* 03/26/2014   MCV 116.1* 03/26/2014   PLT 456* 03/26/2014   NEUTROABS 5.1 03/26/2014    Ferritin on 03/26/2014-101  Medications: I have reviewed the patient's current medications.  Assessment/Plan: 1. 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 2. Secondary iron overload: Maintained on Exjade. Exjade was resumed 10/14/2009. The ferritin remains adequate.  3. Hospitalization 05/06 through 10/06/2009 with acute renal failure, likely secondary to dehydration. She was also noted to have an elevated ammonia level. The ammonia level normalized during the hospitalization.  4. Hospitalization 03/18 through 08/16/2009 with pneumonia. 5. History of atrial fibrillation/flutter: Status post a Maze procedure. 6. History of mitral regurgitation: Status post mitral valve repair by Dr. Roxy [name] in February 2010. 7. History of a pericardial effusion. 8. Chronic bone pain: Controlled with tramadol. 9. 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 10. Exertional dyspnea: Likely related to multiple factors including cardiac disease, diaphragmatic paralysis, anemia, asthma and pulmonary hypertension. She is followed by Dr. Gwenette [name] and Dr. Haroldine [name] 11. Pneumococcal vaccine-she had a local reaction to a pneumococcal vaccine in the remote past. She tolerated the pneumococcal vaccine in December of 2007 without apparent toxicity. She received a pneumococcal vaccine on 04/08/2012. She received a 13 valent pneumococcal vaccine today 12. Admission 09/08/2013 with an upper respiratory infection and heart failure with rapid atrial fibrillation, status post a cardioversion 09/11/2013   Disposition:  She appears stable from a hematologic standpoint. [name] received a Prevnar vaccine today. Her insurance will be changing in April of 2016. She is concerned about the cost of exjade. We will see her in March of 2016 to be sure she is able to continue receiving the exjade.  Amanda Coder, MD  03/26/2014  10:48 AM

## 2014-03-26 NOTE — Telephone Encounter (Signed)
Per [place] in managed care, assistance is available for Exjade. Pt will need to bring proof of income when she is ready to apply. Pt made aware.

## 2014-03-30 ENCOUNTER — Other Ambulatory Visit: Payer: Self-pay | Admitting: Obstetrics and Gynecology

## 2014-03-31 LAB — CYTOLOGY - PAP

## 2014-04-05 ENCOUNTER — Other Ambulatory Visit (HOSPITAL_COMMUNITY): Payer: Self-pay | Admitting: Internal Medicine

## 2014-04-12 ENCOUNTER — Other Ambulatory Visit (HOSPITAL_COMMUNITY): Payer: Self-pay

## 2014-04-13 ENCOUNTER — Encounter: Payer: Self-pay | Admitting: Pulmonary Disease

## 2014-04-13 ENCOUNTER — Ambulatory Visit (INDEPENDENT_AMBULATORY_CARE_PROVIDER_SITE_OTHER): Payer: BC Managed Care – PPO | Admitting: Pulmonary Disease

## 2014-04-13 VITALS — BP 116/64 | HR 78 | Temp 98.5°F | Ht 62.0 in | Wt 161.0 lb

## 2014-04-13 DIAGNOSIS — J4520 Mild intermittent asthma, uncomplicated: Secondary | ICD-10-CM

## 2014-04-13 NOTE — Patient Instructions (Signed)
Continue on your current asthma medications Work on weight loss, and will refer you to the pulmonary rehab program at [facility].  followup with me again in 28mos.

## 2014-04-13 NOTE — Progress Notes (Signed)
   Subjective:    Patient ID: [name], female    DOB: 1950-12-01, 63 y.o.   MRN: [id number]  HPI The patient comes in today for follow-up of her known asthma. She also has chronic respiratory failure related to pulmonary hypertension and underlying cardiac disease. At her last visit, she had an episode of acute asthmatic bronchitis, and was treated with antibiotics and steroids. She has since returned to baseline, and feels that she is breathing better at this point. She is staying on her maintenance bronchodilator regimen, and has not required her rescue inhaler.   Review of Systems  Constitutional: Negative for fever and unexpected weight change.  HENT: Positive for postnasal drip. Negative for congestion, dental problem, ear pain, nosebleeds, rhinorrhea, sinus pressure, sneezing, sore throat and trouble swallowing.   Eyes: Negative for redness and itching.  Respiratory: Positive for shortness of breath. Negative for cough, chest tightness and wheezing.   Cardiovascular: Negative for palpitations and leg swelling.  Gastrointestinal: Negative for nausea and vomiting.  Genitourinary: Negative for dysuria.  Musculoskeletal: Negative for joint swelling.  Skin: Negative for rash.  Neurological: Negative for headaches.  Hematological: Does not bruise/bleed easily.  Psychiatric/Behavioral: Negative for dysphoric mood. The patient is not nervous/anxious.        Objective:   Physical Exam Overweight female in no acute distress Nose without purulence or discharge noted Neck without lymphadenopathy or thyromegaly Chest with clear breath sounds, no wheezing Cardiac exam with regular rate and rhythm Lower extremities without significant edema, no cyanosis Alert and oriented, moves all 4 extremities.       Assessment & Plan:

## 2014-04-13 NOTE — Assessment & Plan Note (Signed)
The patient is doing very well from an asthma standpoint on her current regimen. She has returned to baseline after her recent episode of acute asthmatic bronchitis. I have asked her to continue on her current regimen, and to work aggressively on weight loss and conditioning. She is interested in the pulmonary rehabilitation program at [facility] hospital, and will therefore make the appropriate referral.

## 2014-04-20 ENCOUNTER — Other Ambulatory Visit: Payer: Self-pay | Admitting: Cardiology

## 2014-04-27 ENCOUNTER — Telehealth (HOSPITAL_COMMUNITY): Payer: Self-pay

## 2014-04-27 NOTE — Telephone Encounter (Signed)
Called patient regarding entrance to Pulmonary Rehab.  Patient states that they are interested in attending the program.  Michaela is going to verify insurance coverage and follow up.  Codes given.

## 2014-05-02 ENCOUNTER — Other Ambulatory Visit (HOSPITAL_COMMUNITY): Payer: Self-pay | Admitting: Internal Medicine

## 2014-05-02 DIAGNOSIS — I5022 Chronic systolic (congestive) heart failure: Secondary | ICD-10-CM

## 2014-05-04 ENCOUNTER — Telehealth (HOSPITAL_COMMUNITY): Payer: Self-pay

## 2014-05-04 NOTE — Telephone Encounter (Signed)
[name] has checked her insurance and has a high deduct able and once her portion is paid, a copay. Patient expressed interest in the [facility] pulmonary rehab maintenance program. Will send order to [facility]. Orientation tentatively scheduled for Jan 4 at 1:15

## 2014-05-10 ENCOUNTER — Other Ambulatory Visit: Payer: Self-pay | Admitting: *Deleted

## 2014-05-10 DIAGNOSIS — D594 Other nonautoimmune hemolytic anemias: Secondary | ICD-10-CM

## 2014-05-10 MED ORDER — DEFERASIROX 500 MG PO TBSO: 1500.0000 mg | ORAL_TABLET | Freq: Every day | ORAL | Status: DC

## 2014-05-17 ENCOUNTER — Ambulatory Visit (HOSPITAL_COMMUNITY)
Admission: RE | Admit: 2014-05-17 | Discharge: 2014-05-17 | Disposition: A | Discharge status: Home / Self Care | Payer: BC Managed Care – PPO | Source: Ambulatory Visit | Attending: Internal Medicine | Admitting: Internal Medicine

## 2014-05-17 ENCOUNTER — Encounter (HOSPITAL_COMMUNITY): Payer: Self-pay

## 2014-05-17 ENCOUNTER — Ambulatory Visit (HOSPITAL_BASED_OUTPATIENT_CLINIC_OR_DEPARTMENT_OTHER)
Admission: RE | Admit: 2014-05-17 | Discharge: 2014-05-17 | Disposition: A | Discharge status: Home / Self Care | Payer: BC Managed Care – PPO | Source: Ambulatory Visit | Attending: Internal Medicine | Admitting: Internal Medicine

## 2014-05-17 VITALS — BP 128/76 | HR 72 | Wt 161.0 lb

## 2014-05-17 DIAGNOSIS — I272 Other secondary pulmonary hypertension: Secondary | ICD-10-CM | POA: Insufficient documentation

## 2014-05-17 DIAGNOSIS — I10 Essential (primary) hypertension: Secondary | ICD-10-CM | POA: Diagnosis not present

## 2014-05-17 DIAGNOSIS — I5032 Chronic diastolic (congestive) heart failure: Secondary | ICD-10-CM | POA: Diagnosis not present

## 2014-05-17 DIAGNOSIS — J45909 Unspecified asthma, uncomplicated: Secondary | ICD-10-CM | POA: Insufficient documentation

## 2014-05-17 DIAGNOSIS — I270 Primary pulmonary hypertension: Secondary | ICD-10-CM

## 2014-05-17 DIAGNOSIS — I480 Paroxysmal atrial fibrillation: Secondary | ICD-10-CM | POA: Insufficient documentation

## 2014-05-17 DIAGNOSIS — I369 Nonrheumatic tricuspid valve disorder, unspecified: Secondary | ICD-10-CM | POA: Diagnosis not present

## 2014-05-17 DIAGNOSIS — Z7901 Long term (current) use of anticoagulants: Secondary | ICD-10-CM | POA: Diagnosis not present

## 2014-05-17 LAB — BASIC METABOLIC PANEL
Anion gap: 13 (ref 5–15)
BUN: 20 mg/dL (ref 6–23)
CALCIUM: 9.1 mg/dL (ref 8.4–10.5)
CO2: 26 mEq/L (ref 19–32)
CREATININE: 0.73 mg/dL (ref 0.50–1.10)
Chloride: 103 mEq/L (ref 96–112)
GFR, EST NON AFRICAN AMERICAN: 88 mL/min — AB (ref >90)
Glucose, Bld: 82 mg/dL (ref 70–99)
Potassium: 4.1 mEq/L (ref 3.7–5.3)
Sodium: 142 mEq/L (ref 137–147)

## 2014-05-17 LAB — CBC
HCT: 27 % — ABNORMAL LOW (ref 36.0–46.0)
Hemoglobin: 8.1 g/dL — ABNORMAL LOW (ref 12.0–15.0)
MCH: 34.5 pg — ABNORMAL HIGH (ref 26.0–34.0)
MCHC: 30 g/dL (ref 30.0–36.0)
MCV: 114.9 fL — AB (ref 78.0–100.0)
PLATELETS: 508 10*3/uL — AB (ref 150–400)
RBC: 2.35 MIL/uL — AB (ref 3.87–5.11)
RDW: 13.9 % (ref 11.5–15.5)
WBC: 9.3 10*3/uL (ref 4.0–10.5)

## 2014-05-17 NOTE — Progress Notes (Signed)
Echo Lab  2D Echocardiogram completed.  [place], RDCS 05/17/2014 9:47 AM

## 2014-05-17 NOTE — Patient Instructions (Signed)
Doing great.  Have a wonderful Christmas and happy New Years.  Call any issues.  Will call about lab work and Echo  Follow up in 4 months  Do the following things EVERYDAY: 1) Weigh yourself in the morning before breakfast. Write it down and keep it in a log. 2) Take your medicines as prescribed 3) Eat low salt foods-Limit salt (sodium) to 2000 mg per day.  4) Stay as active as you can everyday 5) Limit all fluids for the day to less than 2 liters 6)

## 2014-05-17 NOTE — Progress Notes (Signed)
Patient ID: [name], female   DOB: [birth year], 63 y.o.   MRN: [id number]  Pulmonary: Dr Gwenette Greet PCP: Dr Inda Merlin   HPI: [name] is a 63 y/o woman with h/o HTN, pyruvate kinase deficiency with hemolytic anemia, obesity, asthma, OSA (on CPAP), mitral valve prolapse s/p mini MV repair/Maze (2011 with Dr. [name]), PAF s/p RFA followed by surgical Maze 7867 and diastolic dysfunction.  Pre-op cath with normal coronaries in 2011. 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   Baseline hgb 7-9. Doesn't get transfused unless 6 or below.  [place] 06/24/12 RA = 10  RV = 60/7/12  PA = 58/25 (40)  PCW = 19 (no significant v-waves)  Fick cardiac output/index = 6.6/3.7  PVR = 3.1 [place]  FA sat = 91%  PA sat = 66%, 67% PVR was not significantly elevated so selective pulmonary vasodilators were noted started.   Echo 12/13 EF 60-65% mild LVH. Grade 2 DD. MVR stable (no MS noted). Moderate LAE. RV mildly dilated. Mod-severe TR with estimated RVSP 80-90 range (in 2011 was 40-45 mm HG).  ECHO 10/22/12 EF 55% RV mildly decreased + Septal bounce. Moderate TR. RVSP 65-70. +diastolic dysfunction (likely restrictive - but tissue Doppler not performed).   TEE (4/15): EF 60-65%, severe bilateral atrial enlargement, s/p MV ring with no MR and mild mitral stenosis with meean gradient 6, MVA 1.7 cm^2.  PASP on TTE in 4/15 was 60 mmHg.   Follow up for PAH and diastolic HF: Doing well. Starting back pulmonary rehab in January. On 2 L continuous O2. Denies CP, PND or orthopnea. Weight at home trending down, 158-161 lbs. No longer having epistaxis putting vaseline in nares. Taking medications as prescribed. Can walk around the whole grocery store without stopping.  Following a low salt diet and drinking less than 2L a day. Takes extra 40 mg lasix 1-2 times a month.   Labs (7/14): Potassium 4.6 BUN 16 Creatinine 0.68 hemoglobin 9, LDL 63 Labs (4/15): K 4.3, creatinine 0.9, HCT 26.5 Labs 09/21/13:  K  3.5  Labs 10/21/13 K 4.1 Creatinine 0.76 Labs 7/15: Hgb 9.1  ROS: All systems negative except as listed in HPI, PMH and Problem List.  Past Medical History  Diagnosis Date  . Pyruvate kinase deficiency hemolytic anemia   . Asthma   . Syncope and collapse 2012    r/t anemia  . Anemia   . Dyspnea   . Mitral regurgitation     s/p MV repair complicated by diaphragmatic paralysis  . Fatigue   . Myoclonus   . Heart murmur     severe MR s/p MV repair  . GERD (gastroesophageal reflux disease)   . Allergic rhinitis   . Depression   . Gastritis     NSAID induced  . Asthma   . Hx of asplenia     surgical  . Osteoporosis   . Trochanteric bursitis     left hip  . Pulmonary HTN     mild to moderate by [place] 05/2012  . Exercise hypoxemia     supplemental O2 PRN  . Hypertension   . Atrial fibrillation     s/p RFA and Maze procedure - resolved  . Atrial tachycardia     Current Outpatient Prescriptions  Medication Sig Dispense Refill  . acetaminophen (TYLENOL) 500 MG tablet Take 1,000 mg by mouth every 6 (six) hours as needed for mild pain or fever.    . bisoprolol (ZEBETA) 5 MG  tablet TAKE 1 TABLET BY MOUTH TWICE DAILY 60 tablet 3  . budesonide-formoterol (SYMBICORT) 160-4.5 MCG/ACT inhaler Inhale 2 puffs into the lungs 2 (two) times daily.    . calcium carbonate (OS-CAL) 600 MG TABS Take 600 mg by mouth 2 (two) times daily with a meal.    . CARTIA XT 240 MG 24 hr capsule TAKE 1 CAPSULE BY MOUTH DAILY 30 capsule 3  . CHLORPHENIRAMINE MALEATE PO Take 4 mg by mouth at bedtime.    . cholecalciferol (VITAMIN D) 1000 UNITS tablet Take 1,000 Units by mouth daily.    . clonazePAM (KLONOPIN) 1 MG tablet Take 0.5 mg by mouth at bedtime as needed.     . deferasirox (EXJADE) 500 MG disintegrating tablet Take 3 tablets (1,500 mg total) by mouth daily with supper. Take 3 tablets once daily 30 minutes prior to largest meal 90 tablet 6  . ELIQUIS 5 MG TABS tablet TAKE 1 TABLET BY MOUTH TWICE DAILY 60  tablet 0  . EPINEPHrine (EPI-PEN) 0.3 mg/0.3 mL DEVI Inject 0.3 mg into the muscle once as needed.    Marland Kitchen escitalopram (LEXAPRO) 10 MG tablet Take 5 mg by mouth daily.     . fexofenadine (ALLEGRA) 180 MG tablet Take 180 mg by mouth at bedtime.     . folic acid (FOLVITE) 1 MG tablet Take 1 mg by mouth daily.    . furosemide (LASIX) 80 MG tablet Take 80 mg by mouth. 80mg  every am and 40mg  in pm    . guaiFENesin (MUCINEX) 600 MG 12 hr tablet Take 600 mg by mouth 2 (two) times daily as needed for to loosen phlegm.     . Melatonin 10 MG CAPS Take 1 capsule by mouth at bedtime.    . mometasone (NASONEX) 50 MCG/ACT nasal spray Place 1 spray into both nostrils at bedtime.     . Multiple Vitamins-Minerals (AIRBORNE) CHEW Chew 1 tablet by mouth 2 (two) times daily.     . NON FORMULARY Inhale 2 L into the lungs continuous. Oxygen at 2 l/min [place]    . pantoprazole (PROTONIX) 40 MG tablet Take 40 mg by mouth daily.    . potassium chloride SA (K-DUR,KLOR-CON) 20 MEQ tablet Take 2 tablets (40 mEq total) by mouth 2 (two) times daily. 120 tablet 3  . Probiotic Product (ALIGN) 4 MG CAPS Take 1 tablet by mouth daily.    . raloxifene (EVISTA) 60 MG tablet Take 60 mg by mouth daily.    . sodium chloride (OCEAN) 0.65 % nasal spray Place 2 sprays into both nostrils 2 (two) times daily.     . traMADol (ULTRAM) 50 MG tablet Take 50 mg by mouth. 3-4 times daily    . UNABLE TO FIND Med Name: CPAP     No current facility-administered medications for this encounter.   History  Substance Use Topics  . Smoking status: Never Smoker   . Smokeless tobacco: Never Used  . Alcohol Use: Yes     Comment: socially glass red wine monthly   Family History  Problem Relation Age of Onset  . Breast cancer Mother 57    ER+  . Melanoma Father 62  . Adrenal disorder Sister     pheochromacytoma - rt. adrenal gland  . COPD Maternal Aunt   . Stomach cancer Paternal Uncle     onset in 45s  . Lung cancer Maternal Grandmother     died  late 25s, heavy smoker  . Heart disease Maternal Grandfather  hardening of the arteries, poor circulation  . Ovarian cancer Sister 10    stage IV, now 51    Filed Vitals:   05/17/14 0941  BP: 128/76  Pulse: 72  Weight: 161 lb (73.029 kg)  SpO2: 94%     PHYSICAL EXAM: General: NAD wearing O2 HEENT: normal Neck: supple. JVP 7-8 cm. Carotids 2+ bilaterally; no bruits. No lymphadenopathy or thryomegaly appreciated. Cor: PMI normal. Regular rate & irregular  rhythm. No rubs, gallops.  2/6 HSM LLSB. Mildly increased P2 no RV lift  Lungs: clear 2 liters [place], no wheezing or crackles Abdomen: soft, nontender, nondistended. No hepatosplenomegaly. No bruits or masses. Good bowel sounds. Extremities: no cyanosis, clubbing, rash, edema  Neuro: alert & orientedx3, cranial nerves grossly intact. Moves all 4 extremities w/o difficulty. Affect pleasant.  ASSESSMENT & PLAN:  1. Atrial fibrillation: Paroxysmal. Appears to be in afib currently and she reports she thinks she has been in it for awhile. HR controlled.  Given the size of her atria, likely will not be able to keep her in NSR. Continue apixaban. No bleeding problems noted, continue Vaseline in nares.  - Check CBC. - Continue bisoprolol and diltiazem CD.  2. Pulmonary arterial hypertension: PVR was only 3.1 WU on last RHC. The [place] may be primarily Who Group 3 in the setting of OSA and chronic respiratory failure (on home oxygen, ?OHS).  - Continue OSA at night and oxygen during the day.  - Pending Echo today to reassess PA pressures. - Agree with enrolling back in Pulmonary Rehab.  3. Chronic diastolic CHF: Volume status stable.  - Continue Lasix 80 qam/40 qpm and KCl 40 bid.  Reinforced need for daily weights and reviewed use of sliding scale diuretics. - Check BMET.  - Congratulated her on weight loss.  4. Asthma: Stable. Per Dr Gwenette Greet.   Follow up in 4 months Rande Brunt NP-C 05/17/2014

## 2014-05-21 ENCOUNTER — Other Ambulatory Visit: Payer: Self-pay | Admitting: Internal Medicine

## 2014-05-31 ENCOUNTER — Encounter (HOSPITAL_COMMUNITY)
Admission: RE | Admit: 2014-05-31 | Discharge: 2014-05-31 | Disposition: A | Discharge status: Home / Self Care | Payer: BLUE CROSS/BLUE SHIELD | Source: Ambulatory Visit | Attending: Pulmonary Disease | Admitting: Pulmonary Disease

## 2014-05-31 ENCOUNTER — Telehealth: Payer: Self-pay | Admitting: Pulmonary Disease

## 2014-05-31 DIAGNOSIS — I272 Other secondary pulmonary hypertension: Secondary | ICD-10-CM | POA: Insufficient documentation

## 2014-05-31 NOTE — Telephone Encounter (Signed)
Order form has been faxed back. Nothing further needed

## 2014-05-31 NOTE — Telephone Encounter (Signed)
Lyman please advise thanks

## 2014-05-31 NOTE — Telephone Encounter (Signed)
Fax received and placed for Dr Gwenette Greet to sign.  Will forward to Mindy to sign off when complete and faxed.

## 2014-06-01 ENCOUNTER — Other Ambulatory Visit: Payer: Self-pay | Admitting: *Deleted

## 2014-06-01 MED ORDER — FUROSEMIDE 40 MG PO TABS: ORAL_TABLET | ORAL | Status: DC

## 2014-06-07 ENCOUNTER — Encounter (HOSPITAL_COMMUNITY)
Admission: RE | Admit: 2014-06-07 | Discharge: 2014-06-07 | Disposition: A | Discharge status: Home / Self Care | Payer: BLUE CROSS/BLUE SHIELD | Source: Ambulatory Visit | Attending: Pulmonary Disease | Admitting: Pulmonary Disease

## 2014-06-07 DIAGNOSIS — I272 Other secondary pulmonary hypertension: Secondary | ICD-10-CM | POA: Diagnosis present

## 2014-06-09 ENCOUNTER — Encounter (HOSPITAL_COMMUNITY): Payer: BLUE CROSS/BLUE SHIELD

## 2014-06-11 ENCOUNTER — Encounter (HOSPITAL_COMMUNITY)
Admission: RE | Admit: 2014-06-11 | Discharge: 2014-06-11 | Disposition: A | Discharge status: Home / Self Care | Payer: BLUE CROSS/BLUE SHIELD | Source: Ambulatory Visit | Attending: Pulmonary Disease | Admitting: Pulmonary Disease

## 2014-06-14 ENCOUNTER — Encounter (HOSPITAL_COMMUNITY): Payer: BLUE CROSS/BLUE SHIELD

## 2014-06-16 ENCOUNTER — Encounter (HOSPITAL_COMMUNITY)
Admission: RE | Admit: 2014-06-16 | Discharge: 2014-06-16 | Disposition: A | Discharge status: Home / Self Care | Payer: BLUE CROSS/BLUE SHIELD | Source: Ambulatory Visit | Attending: Pulmonary Disease | Admitting: Pulmonary Disease

## 2014-06-18 ENCOUNTER — Encounter (HOSPITAL_COMMUNITY): Payer: BLUE CROSS/BLUE SHIELD

## 2014-06-21 ENCOUNTER — Encounter (HOSPITAL_COMMUNITY): Payer: BLUE CROSS/BLUE SHIELD

## 2014-06-23 ENCOUNTER — Encounter (HOSPITAL_COMMUNITY): Payer: BLUE CROSS/BLUE SHIELD

## 2014-06-24 ENCOUNTER — Telehealth: Payer: Self-pay | Admitting: Oncology

## 2014-06-24 NOTE — Telephone Encounter (Signed)
Pt called to r/s due to flu, r/s and pt confirmed.......Marland Kitchen KJ

## 2014-06-25 ENCOUNTER — Encounter (HOSPITAL_COMMUNITY): Payer: BLUE CROSS/BLUE SHIELD

## 2014-06-25 ENCOUNTER — Ambulatory Visit (INDEPENDENT_AMBULATORY_CARE_PROVIDER_SITE_OTHER): Payer: BLUE CROSS/BLUE SHIELD | Admitting: Pulmonary Disease

## 2014-06-25 ENCOUNTER — Telehealth: Payer: Self-pay | Admitting: Pulmonary Disease

## 2014-06-25 ENCOUNTER — Other Ambulatory Visit: Payer: BC Managed Care – PPO

## 2014-06-25 ENCOUNTER — Encounter: Payer: Self-pay | Admitting: Pulmonary Disease

## 2014-06-25 ENCOUNTER — Encounter (INDEPENDENT_AMBULATORY_CARE_PROVIDER_SITE_OTHER): Payer: Self-pay

## 2014-06-25 ENCOUNTER — Other Ambulatory Visit: Payer: Self-pay | Admitting: *Deleted

## 2014-06-25 VITALS — BP 122/80 | HR 80 | Temp 97.8°F | Ht 62.0 in | Wt 162.2 lb

## 2014-06-25 DIAGNOSIS — D594 Other nonautoimmune hemolytic anemias: Secondary | ICD-10-CM

## 2014-06-25 DIAGNOSIS — J40 Bronchitis, not specified as acute or chronic: Secondary | ICD-10-CM

## 2014-06-25 DIAGNOSIS — J45901 Unspecified asthma with (acute) exacerbation: Secondary | ICD-10-CM

## 2014-06-25 DIAGNOSIS — J329 Chronic sinusitis, unspecified: Secondary | ICD-10-CM

## 2014-06-25 MED ORDER — AMOXICILLIN-POT CLAVULANATE 875-125 MG PO TABS: 1.0000 | ORAL_TABLET | Freq: Two times a day (BID) | ORAL | Status: DC

## 2014-06-25 MED ORDER — DEFERASIROX 500 MG PO TBSO: 1500.0000 mg | ORAL_TABLET | Freq: Every day | ORAL | Status: DC

## 2014-06-25 MED ORDER — PREDNISONE 10 MG PO TABS: ORAL_TABLET | ORAL | Status: DC

## 2014-06-25 NOTE — Telephone Encounter (Signed)
lmtcb for pt.  

## 2014-06-25 NOTE — Assessment & Plan Note (Signed)
The patient has acute sinobronchitis by her history, and will need a course of antibiotics in order to get her through this. I have discussed with her trying Mucinex and also nasal rinses in order to help clearance of her purulent secretions.

## 2014-06-25 NOTE — Assessment & Plan Note (Signed)
The patient has noted increasing shortness of breath and chest tightness, along with wheezing. This is most likely related to her sinopulmonary infection. Will treat her with a course of steroids.

## 2014-06-25 NOTE — Patient Instructions (Signed)
Will treat with 8 day course of prednisone augmentin 875mg  one in am and pm with large glass of water for 7 days. Can take mucinex dm one in am and pm until better. Keep followup apptm with me, but call if not getting better.

## 2014-06-25 NOTE — Telephone Encounter (Signed)
Pt scheduled to come in this afternoon.

## 2014-06-25 NOTE — Progress Notes (Signed)
   Subjective:    Patient ID: [name], female    DOB: Jun 07, 1950, 64 y.o.   MRN: [id number]  HPI The patient comes in today for an acute sick visit. She has known asthma, but also chronic respiratory failure secondary to diastolic heart failure and secondary pulmonary hypertension. She notes a 2 day history of increasing sinus congestion and pressure, postnasal drip, and now chest congestion with cough productive of pale yellow mucus. She has also noticed increasing shortness of breath with restriction to airflow, and occasional wheezing. She has not had any fevers, chills, or sweats. She has had to turn up her oxygen with activity.   Review of Systems  Constitutional: Negative for fever and unexpected weight change.  HENT: Positive for congestion, postnasal drip and sinus pressure. Negative for dental problem, ear pain, nosebleeds, rhinorrhea, sneezing, sore throat and trouble swallowing.   Eyes: Negative for redness and itching.  Respiratory: Positive for cough, chest tightness, shortness of breath and wheezing.   Cardiovascular: Negative for palpitations and leg swelling.  Gastrointestinal: Negative for nausea and vomiting.  Genitourinary: Negative for dysuria.  Musculoskeletal: Negative for joint swelling.  Skin: Negative for rash.  Neurological: Negative for headaches.  Hematological: Does not bruise/bleed easily.  Psychiatric/Behavioral: Negative for dysphoric mood. The patient is not nervous/anxious.        Objective:   Physical Exam Well-developed female in no acute distress Nose without purulence or discharge noted Neck without lymphadenopathy or thyromegaly Chest with adequate breath sounds bilaterally, no wheezing, occasional rhonchi. Cardiac exam with regular rate and rhythm, 2/6 systolic murmur Lower extremities with mild edema, no cyanosis Alert and oriented, moves all 4 extremities.       Assessment & Plan:

## 2014-06-25 NOTE — Telephone Encounter (Signed)
Pt needs to be fitted in somewhere this afternoon.

## 2014-06-25 NOTE — Telephone Encounter (Signed)
Duplicate

## 2014-06-25 NOTE — Telephone Encounter (Signed)
Pt c/o increased cough with light [place] Balsam to dark [place] Norell mucus production, SOB and chest tightness.  Denies fever. States that it all started about 3 days ago with PND.  Pt uses 2L/min O2 and has been having to increased this to 3-4L/min with her SOB.  Requesting to be seen today - aware that [facility] is 100% booked but that we would see if he can offer any rec's Pt requests to see Dr Gwenette Greet only and is requesting an office visit since its Friday, wants someone to listen to her chest. Pt is concerned of having to possibly go to ED over the weekend.   Allergies  Allergen Reactions  . Iodine   . Iohexol      Code: HIVES, Desc: reaction since childhood, Onset Date: 39030092   . Shellfish Allergy    Please advise Dr Gwenette Greet. Thanks.

## 2014-06-28 ENCOUNTER — Encounter (HOSPITAL_COMMUNITY): Payer: Medicare Other

## 2014-06-28 DIAGNOSIS — I272 Other secondary pulmonary hypertension: Secondary | ICD-10-CM | POA: Insufficient documentation

## 2014-06-30 ENCOUNTER — Encounter (HOSPITAL_COMMUNITY): Payer: Self-pay

## 2014-06-30 ENCOUNTER — Other Ambulatory Visit: Payer: Self-pay | Admitting: Pulmonary Disease

## 2014-07-02 ENCOUNTER — Encounter (HOSPITAL_COMMUNITY): Payer: Self-pay

## 2014-07-02 ENCOUNTER — Telehealth: Payer: Self-pay | Admitting: Pulmonary Disease

## 2014-07-02 MED ORDER — AMOXICILLIN-POT CLAVULANATE 875-125 MG PO TABS: 1.0000 | ORAL_TABLET | Freq: Two times a day (BID) | ORAL | Status: DC

## 2014-07-02 NOTE — Telephone Encounter (Signed)
Per 06/25/14: Patient Instructions       Will treat with 8 day course of prednisone augmentin 875mg  one in am and pm with large glass of water for 7 days. Can take mucinex dm one in am and pm until better. Keep followup apptm with me, but call if not getting better  --  Called spoke with pt. She is asking for another round of ABX. She reports she has started wheezing, coughing up green phlem today, nasal congestion, PND. She was taking mucinex DM up until yesterday. Please advise KC thanks  Allergies  Allergen Reactions  . Iodine   . Iohexol      Code: HIVES, Desc: reaction since childhood, Onset Date: 16109604   . Shellfish Allergy

## 2014-07-02 NOTE — Telephone Encounter (Signed)
augmentin sent in, pt aware.  Nothing further needed.

## 2014-07-02 NOTE — Telephone Encounter (Signed)
Ok to call in another 7 days of augmentin.  If she is not better, will need to see her back.

## 2014-07-05 ENCOUNTER — Encounter (HOSPITAL_COMMUNITY): Payer: Self-pay

## 2014-07-06 ENCOUNTER — Ambulatory Visit (INDEPENDENT_AMBULATORY_CARE_PROVIDER_SITE_OTHER)
Admission: RE | Admit: 2014-07-06 | Discharge: 2014-07-06 | Disposition: A | Discharge status: Home / Self Care | Payer: BLUE CROSS/BLUE SHIELD | Source: Ambulatory Visit | Attending: Pulmonary Disease | Admitting: Pulmonary Disease

## 2014-07-06 ENCOUNTER — Ambulatory Visit (INDEPENDENT_AMBULATORY_CARE_PROVIDER_SITE_OTHER): Payer: BLUE CROSS/BLUE SHIELD | Admitting: Pulmonary Disease

## 2014-07-06 ENCOUNTER — Encounter: Payer: Self-pay | Admitting: Pulmonary Disease

## 2014-07-06 ENCOUNTER — Other Ambulatory Visit: Payer: Medicare Other

## 2014-07-06 VITALS — BP 136/74 | HR 92 | Temp 97.3°F | Ht 62.0 in | Wt 157.2 lb

## 2014-07-06 DIAGNOSIS — J4520 Mild intermittent asthma, uncomplicated: Secondary | ICD-10-CM

## 2014-07-06 DIAGNOSIS — J329 Chronic sinusitis, unspecified: Secondary | ICD-10-CM

## 2014-07-06 DIAGNOSIS — J40 Bronchitis, not specified as acute or chronic: Secondary | ICD-10-CM

## 2014-07-06 NOTE — Patient Instructions (Signed)
Will check chest xray today ,and call you with results. Will do scan of your sinuses, and call you with results. No change in breathing medications.

## 2014-07-06 NOTE — Assessment & Plan Note (Signed)
The patient's lung fields are totally clear today on exam, and therefore I suspect that her cough and mucus production is not coming from her tracheobronchial tree.

## 2014-07-06 NOTE — Assessment & Plan Note (Signed)
The patient continues to have sinus congestion, sinus pressure, and postnasal drip with hoarseness. I still feel that she may have a significant sinusitis, despite being on Augmentin for almost 2 weeks. Will do a sinus scan for further evaluation.

## 2014-07-06 NOTE — Progress Notes (Signed)
   Subjective:    Patient ID: [name], female    DOB: 12-08-1950, 64 y.o.   MRN: [id number]  HPI The patient comes in today for an acute sick visit. She has known asthma, as well as chronic respiratory failure secondary to diastolic dysfunction and secondary pulmonary hypertension. I saw her last week where she was felt to have sinobronchitis, and was treated with antibiotics and a short course of prednisone to help with upper airway inflammation. She called the end of last week where she was having persistent symptoms, and her antibiotic regimen was extended another 7 days. Comes in today where she is continuing to have cough with discolored mucus, but cannot tell whether it is coming from her chest or the back of her throat. She continues to have a nasal voice, hoarseness, postnasal drip, and significant sinus pressure. She has not had increasing shortness of breath.   Review of Systems  Constitutional: Negative for fever and unexpected weight change.  HENT: Positive for congestion, postnasal drip and rhinorrhea. Negative for dental problem, ear pain, nosebleeds, sinus pressure, sneezing, sore throat and trouble swallowing.   Eyes: Negative for redness and itching.  Respiratory: Positive for cough, shortness of breath and wheezing. Negative for chest tightness.   Cardiovascular: Negative for palpitations and leg swelling.  Gastrointestinal: Negative for nausea and vomiting.  Genitourinary: Negative for dysuria.  Musculoskeletal: Negative for joint swelling.  Skin: Negative for rash.  Neurological: Positive for headaches.  Hematological: Does not bruise/bleed easily.  Psychiatric/Behavioral: Negative for dysphoric mood. The patient is not nervous/anxious.        Objective:   Physical Exam Well-developed female in no acute distress Nose without purulence or discharge noted Neck without lymphadenopathy or thyromegaly Oropharynx clear, no exudates Chest is totally clear to  auscultation, no wheezing Cardiac exam with regular rate and rhythm Lower extremities with no significant edema, no cyanosis Alert and oriented, moves all 4 extremities.       Assessment & Plan:

## 2014-07-07 ENCOUNTER — Encounter (HOSPITAL_COMMUNITY): Payer: Self-pay

## 2014-07-07 ENCOUNTER — Telehealth: Payer: Self-pay | Admitting: Pulmonary Disease

## 2014-07-07 MED ORDER — LEVOFLOXACIN 750 MG PO TABS: 750.0000 mg | ORAL_TABLET | Freq: Every day | ORAL | Status: DC

## 2014-07-07 NOTE — Telephone Encounter (Signed)
Please advise on cxr and sinus ct results from 07/06/14.

## 2014-07-07 NOTE — Telephone Encounter (Signed)
Spoke with the pt and notified of recs per [facility]  Pt verbalized understanding  Rx for levaquin was sent to pharm

## 2014-07-07 NOTE — Telephone Encounter (Signed)
Please let pt know that her scan of sinuses shows some persistent fluid in her sinuses with thickening, but it is only mild.  I suspect she does have some persistent sinusitis. Her cxr looks very similar to her prior, although the radiologist did mention possibly an increased area in the right lung that may just be atelectasis (mucus plugging) but can't exclude pna. I think sinusitis is more likely.  Would like to treat her one more week with abx.  Please send in levaquin 750mg  one a day for 7 days.

## 2014-07-09 ENCOUNTER — Encounter (HOSPITAL_COMMUNITY): Payer: Self-pay

## 2014-07-12 ENCOUNTER — Other Ambulatory Visit (HOSPITAL_COMMUNITY): Payer: Self-pay | Admitting: *Deleted

## 2014-07-12 ENCOUNTER — Encounter (HOSPITAL_COMMUNITY): Admission: RE | Admit: 2014-07-12 | Payer: Self-pay | Source: Ambulatory Visit

## 2014-07-12 MED ORDER — BISOPROLOL FUMARATE 5 MG PO TABS: 5.0000 mg | ORAL_TABLET | Freq: Two times a day (BID) | ORAL | Status: DC

## 2014-07-14 ENCOUNTER — Encounter (HOSPITAL_COMMUNITY): Payer: Self-pay

## 2014-07-16 ENCOUNTER — Encounter (HOSPITAL_COMMUNITY): Payer: Self-pay

## 2014-07-19 ENCOUNTER — Encounter (HOSPITAL_COMMUNITY): Payer: Self-pay

## 2014-07-19 ENCOUNTER — Ambulatory Visit: Payer: BLUE CROSS/BLUE SHIELD | Admitting: Pulmonary Disease

## 2014-07-21 ENCOUNTER — Encounter (HOSPITAL_COMMUNITY): Payer: Self-pay

## 2014-07-23 ENCOUNTER — Encounter (HOSPITAL_COMMUNITY)
Admission: RE | Admit: 2014-07-23 | Discharge: 2014-07-23 | Disposition: A | Discharge status: Home / Self Care | Payer: Self-pay | Source: Ambulatory Visit | Attending: Pulmonary Disease | Admitting: Pulmonary Disease

## 2014-07-26 ENCOUNTER — Encounter (HOSPITAL_COMMUNITY): Payer: Self-pay

## 2014-07-28 ENCOUNTER — Encounter (HOSPITAL_COMMUNITY)
Admission: RE | Admit: 2014-07-28 | Discharge: 2014-07-28 | Disposition: A | Discharge status: Home / Self Care | Payer: Self-pay | Source: Ambulatory Visit | Attending: Pulmonary Disease | Admitting: Pulmonary Disease

## 2014-07-28 DIAGNOSIS — I272 Other secondary pulmonary hypertension: Secondary | ICD-10-CM | POA: Insufficient documentation

## 2014-07-30 ENCOUNTER — Encounter (HOSPITAL_COMMUNITY): Payer: Self-pay

## 2014-08-02 ENCOUNTER — Encounter (HOSPITAL_COMMUNITY)
Admission: RE | Admit: 2014-08-02 | Discharge: 2014-08-02 | Disposition: A | Discharge status: Home / Self Care | Payer: Self-pay | Source: Ambulatory Visit | Attending: Pulmonary Disease | Admitting: Pulmonary Disease

## 2014-08-04 ENCOUNTER — Encounter (HOSPITAL_COMMUNITY)
Admission: RE | Admit: 2014-08-04 | Discharge: 2014-08-04 | Disposition: A | Discharge status: Home / Self Care | Payer: Self-pay | Source: Ambulatory Visit | Attending: Pulmonary Disease | Admitting: Pulmonary Disease

## 2014-08-06 ENCOUNTER — Encounter (HOSPITAL_COMMUNITY)
Admission: RE | Admit: 2014-08-06 | Discharge: 2014-08-06 | Disposition: A | Discharge status: Home / Self Care | Payer: Self-pay | Source: Ambulatory Visit | Attending: Pulmonary Disease | Admitting: Pulmonary Disease

## 2014-08-06 ENCOUNTER — Other Ambulatory Visit (HOSPITAL_COMMUNITY): Payer: Self-pay | Admitting: *Deleted

## 2014-08-06 MED ORDER — BISOPROLOL FUMARATE 5 MG PO TABS: 5.0000 mg | ORAL_TABLET | Freq: Two times a day (BID) | ORAL | Status: DC

## 2014-08-09 ENCOUNTER — Encounter (HOSPITAL_COMMUNITY): Payer: Self-pay

## 2014-08-11 ENCOUNTER — Encounter (HOSPITAL_COMMUNITY)
Admission: RE | Admit: 2014-08-11 | Discharge: 2014-08-11 | Disposition: A | Discharge status: Home / Self Care | Payer: Self-pay | Source: Ambulatory Visit | Attending: Pulmonary Disease | Admitting: Pulmonary Disease

## 2014-08-12 ENCOUNTER — Other Ambulatory Visit (HOSPITAL_COMMUNITY): Payer: Self-pay | Admitting: *Deleted

## 2014-08-12 DIAGNOSIS — I5022 Chronic systolic (congestive) heart failure: Secondary | ICD-10-CM

## 2014-08-12 MED ORDER — DILTIAZEM HCL ER COATED BEADS 240 MG PO CP24: 240.0000 mg | ORAL_CAPSULE | Freq: Every day | ORAL | Status: DC

## 2014-08-13 ENCOUNTER — Encounter (HOSPITAL_COMMUNITY): Payer: Self-pay

## 2014-08-16 ENCOUNTER — Encounter (HOSPITAL_COMMUNITY): Payer: Self-pay

## 2014-08-18 ENCOUNTER — Encounter (HOSPITAL_COMMUNITY)
Admission: RE | Admit: 2014-08-18 | Discharge: 2014-08-18 | Disposition: A | Discharge status: Home / Self Care | Payer: Self-pay | Source: Ambulatory Visit | Attending: Pulmonary Disease | Admitting: Pulmonary Disease

## 2014-08-20 ENCOUNTER — Encounter (HOSPITAL_COMMUNITY)
Admission: RE | Admit: 2014-08-20 | Discharge: 2014-08-20 | Disposition: A | Discharge status: Home / Self Care | Payer: Self-pay | Source: Ambulatory Visit | Attending: Pulmonary Disease | Admitting: Pulmonary Disease

## 2014-08-23 ENCOUNTER — Ambulatory Visit (HOSPITAL_BASED_OUTPATIENT_CLINIC_OR_DEPARTMENT_OTHER): Payer: BLUE CROSS/BLUE SHIELD | Admitting: Oncology

## 2014-08-23 ENCOUNTER — Telehealth: Payer: Self-pay | Admitting: Oncology

## 2014-08-23 ENCOUNTER — Other Ambulatory Visit (HOSPITAL_BASED_OUTPATIENT_CLINIC_OR_DEPARTMENT_OTHER): Payer: BLUE CROSS/BLUE SHIELD

## 2014-08-23 ENCOUNTER — Encounter (HOSPITAL_COMMUNITY)
Admission: RE | Admit: 2014-08-23 | Discharge: 2014-08-23 | Disposition: A | Discharge status: Home / Self Care | Payer: Self-pay | Source: Ambulatory Visit | Attending: Pulmonary Disease | Admitting: Pulmonary Disease

## 2014-08-23 DIAGNOSIS — D559 Anemia due to enzyme disorder, unspecified: Secondary | ICD-10-CM | POA: Diagnosis not present

## 2014-08-23 DIAGNOSIS — D594 Other nonautoimmune hemolytic anemias: Secondary | ICD-10-CM

## 2014-08-23 LAB — CBC WITH DIFFERENTIAL/PLATELET
BASO%: 1.2 % (ref 0.0–2.0)
BASOS ABS: 0.1 10*3/uL (ref 0.0–0.1)
EOS ABS: 0.7 10*3/uL — AB (ref 0.0–0.5)
EOS%: 6.7 % (ref 0.0–7.0)
HCT: 26.9 % — ABNORMAL LOW (ref 34.8–46.6)
HGB: 8.3 g/dL — ABNORMAL LOW (ref 11.6–15.9)
LYMPH%: 22.9 % (ref 14.0–49.7)
MCH: 35.8 pg — AB (ref 25.1–34.0)
MCHC: 30.9 g/dL — ABNORMAL LOW (ref 31.5–36.0)
MCV: 115.9 fL — ABNORMAL HIGH (ref 79.5–101.0)
MONO#: 2.3 10*3/uL — AB (ref 0.1–0.9)
MONO%: 23.9 % — ABNORMAL HIGH (ref 0.0–14.0)
NEUT%: 45.3 % (ref 38.4–76.8)
NEUTROS ABS: 4.4 10*3/uL (ref 1.5–6.5)
Platelets: 484 10*3/uL — ABNORMAL HIGH (ref 145–400)
RBC: 2.32 10*6/uL — ABNORMAL LOW (ref 3.70–5.45)
RDW: 13.8 % (ref 11.2–14.5)
WBC: 9.6 10*3/uL (ref 3.9–10.3)
lymph#: 2.2 10*3/uL (ref 0.9–3.3)

## 2014-08-23 LAB — FERRITIN CHCC: Ferritin: 56 ng/ml (ref 9–269)

## 2014-08-23 NOTE — Progress Notes (Signed)
  [facility] OFFICE PROGRESS NOTE   Diagnosis: Pyruvate kinase deficiency, secondary iron overload  INTERVAL HISTORY:   [name] returns as scheduled. She continues Exjade. She is in a pulmonary rehabilitation program and continues home oxygen therapy. She complains of insomnia.  Objective:  Vital signs in last 24 hours:  Blood pressure 120/54, pulse 63, temperature 98.5 F (36.9 C), temperature source Oral, resp. rate 18, height 5\' 2"  (1.575 m), weight 161 lb 14.4 oz (73.437 kg), SpO2 96 %.   Resp: Lungs clear bilaterally, no respiratory distress Cardio: Regular rate and rhythm GI: No hepatomegaly Vascular: No leg edema   Lab Results:  Lab Results  Component Value Date   WBC 9.6 08/23/2014   HGB 8.3* 08/23/2014   HCT 26.9* 08/23/2014   MCV 115.9* 08/23/2014   PLT 484* 08/23/2014   NEUTROABS 4.4 08/23/2014    Ferritin-56 Medications: I have reviewed the patient's current medications.  Assessment/Plan: 1. 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 2. Secondary iron overload: Maintained on Exjade. Exjade was resumed 10/14/2009. The ferritin remains adequate.  3. Hospitalization 05/06 through 10/06/2009 with acute renal failure, likely secondary to dehydration. She was also noted to have an elevated ammonia level. The ammonia level normalized during the hospitalization.  4. Hospitalization 03/18 through 08/16/2009 with pneumonia. 5. History of atrial fibrillation/flutter: Status post a Maze procedure. 6. History of mitral regurgitation: Status post mitral valve repair by Dr. [name] in February 2010. 7. History of a pericardial effusion. 8. Chronic bone pain: Controlled with tramadol. 9. 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 10. Exertional dyspnea: Likely related to multiple factors including cardiac disease, diaphragmatic paralysis, anemia, asthma and pulmonary hypertension. She is followed by  Dr. Gwenette Greet and Dr. Haroldine Laws 11. Pneumococcal vaccine-she had a local reaction to a pneumococcal vaccine in the remote past. She tolerated the pneumococcal vaccine in December of 2007 without apparent toxicity. She received a pneumococcal vaccine on 04/08/2012. She received a 13 valent pneumococcal vaccine 03/26/2014 12. Admission 09/08/2013 with an upper respiratory infection and heart failure with rapid atrial fibrillation, status post a cardioversion 09/11/2013    Disposition:  She is stable from a hematologic standpoint. The plan is to continue Exjade. [name] will lose her insurance at the end of April. She is looking into insurance options for coverage of the Exjade. She will be scheduled for an office visit here at the end of April in case she needs to switch back to deferoxamine.  She will discuss the insomnia with Dr. Inda Merlin.  [name], MD  08/23/2014  7:01 PM

## 2014-08-23 NOTE — Telephone Encounter (Signed)
Pt confirmed labs/ov per 03/28 POF, gave pt AVS and calendar.... KJ  °

## 2014-08-25 ENCOUNTER — Encounter (HOSPITAL_COMMUNITY): Payer: Self-pay

## 2014-08-25 ENCOUNTER — Telehealth: Payer: Self-pay | Admitting: *Deleted

## 2014-08-25 NOTE — Telephone Encounter (Signed)
-----   Message from Ladell Pier, MD sent at 08/23/2014  2:18 PM EDT ----- Please call patient, ferritin looks good, f/u as scheduled

## 2014-08-25 NOTE — Telephone Encounter (Signed)
Per Dr. Benay Spice; notified pt that ferritin looks good '56'.  Pt states "oh good, that's great"  Confirmed next appt 09/24/14

## 2014-08-27 ENCOUNTER — Encounter (HOSPITAL_COMMUNITY): Payer: Medicare Other

## 2014-08-27 DIAGNOSIS — I272 Other secondary pulmonary hypertension: Secondary | ICD-10-CM | POA: Insufficient documentation

## 2014-08-30 ENCOUNTER — Encounter (HOSPITAL_COMMUNITY): Payer: Self-pay

## 2014-09-01 ENCOUNTER — Encounter (HOSPITAL_COMMUNITY): Payer: Self-pay

## 2014-09-01 ENCOUNTER — Encounter: Payer: Self-pay | Admitting: Oncology

## 2014-09-01 NOTE — Progress Notes (Signed)
I called and gave the patient PAN's ph to call and see if funds available for asst with Exjade.

## 2014-09-03 ENCOUNTER — Encounter (HOSPITAL_COMMUNITY): Payer: Self-pay

## 2014-09-06 ENCOUNTER — Encounter (HOSPITAL_COMMUNITY)
Admission: RE | Admit: 2014-09-06 | Discharge: 2014-09-06 | Disposition: A | Discharge status: Home / Self Care | Payer: Self-pay | Source: Ambulatory Visit | Attending: Pulmonary Disease | Admitting: Pulmonary Disease

## 2014-09-08 ENCOUNTER — Encounter (HOSPITAL_COMMUNITY)
Admission: RE | Admit: 2014-09-08 | Discharge: 2014-09-08 | Disposition: A | Discharge status: Home / Self Care | Payer: Self-pay | Source: Ambulatory Visit | Attending: Pulmonary Disease | Admitting: Pulmonary Disease

## 2014-09-10 ENCOUNTER — Encounter (HOSPITAL_COMMUNITY): Payer: Self-pay

## 2014-09-10 ENCOUNTER — Other Ambulatory Visit: Payer: Self-pay | Admitting: Internal Medicine

## 2014-09-13 ENCOUNTER — Encounter (HOSPITAL_COMMUNITY)
Admission: RE | Admit: 2014-09-13 | Discharge: 2014-09-13 | Disposition: A | Discharge status: Home / Self Care | Payer: Self-pay | Source: Ambulatory Visit | Attending: Pulmonary Disease | Admitting: Pulmonary Disease

## 2014-09-15 ENCOUNTER — Encounter (HOSPITAL_COMMUNITY)
Admission: RE | Admit: 2014-09-15 | Discharge: 2014-09-15 | Disposition: A | Discharge status: Home / Self Care | Payer: Self-pay | Source: Ambulatory Visit | Attending: Pulmonary Disease | Admitting: Pulmonary Disease

## 2014-09-17 ENCOUNTER — Encounter (HOSPITAL_COMMUNITY): Payer: Self-pay

## 2014-09-18 ENCOUNTER — Other Ambulatory Visit: Payer: Self-pay | Admitting: Pulmonary Disease

## 2014-09-20 ENCOUNTER — Encounter (HOSPITAL_COMMUNITY): Payer: Self-pay

## 2014-09-22 ENCOUNTER — Encounter (HOSPITAL_COMMUNITY)
Admission: RE | Admit: 2014-09-22 | Discharge: 2014-09-22 | Disposition: A | Discharge status: Home / Self Care | Payer: Self-pay | Source: Ambulatory Visit | Attending: Pulmonary Disease | Admitting: Pulmonary Disease

## 2014-09-24 ENCOUNTER — Encounter (HOSPITAL_COMMUNITY): Payer: Self-pay

## 2014-09-24 ENCOUNTER — Ambulatory Visit: Payer: Medicare Other | Admitting: Oncology

## 2014-09-24 ENCOUNTER — Other Ambulatory Visit: Payer: Medicare Other

## 2014-09-27 ENCOUNTER — Encounter (HOSPITAL_COMMUNITY): Payer: Self-pay

## 2014-09-27 DIAGNOSIS — I272 Other secondary pulmonary hypertension: Secondary | ICD-10-CM | POA: Insufficient documentation

## 2014-09-29 ENCOUNTER — Encounter (HOSPITAL_COMMUNITY): Payer: Self-pay

## 2014-10-01 ENCOUNTER — Encounter (HOSPITAL_COMMUNITY): Payer: Self-pay

## 2014-10-04 ENCOUNTER — Encounter (HOSPITAL_COMMUNITY)
Admission: RE | Admit: 2014-10-04 | Discharge: 2014-10-04 | Disposition: A | Discharge status: Home / Self Care | Payer: Self-pay | Source: Ambulatory Visit | Attending: Pulmonary Disease | Admitting: Pulmonary Disease

## 2014-10-06 ENCOUNTER — Encounter (HOSPITAL_COMMUNITY)
Admission: RE | Admit: 2014-10-06 | Discharge: 2014-10-06 | Disposition: A | Discharge status: Home / Self Care | Payer: Self-pay | Source: Ambulatory Visit | Attending: Pulmonary Disease | Admitting: Pulmonary Disease

## 2014-10-10 ENCOUNTER — Other Ambulatory Visit: Payer: Self-pay | Admitting: Internal Medicine

## 2014-10-11 ENCOUNTER — Encounter (HOSPITAL_COMMUNITY)
Admission: RE | Admit: 2014-10-11 | Discharge: 2014-10-11 | Disposition: A | Discharge status: Home / Self Care | Payer: Self-pay | Source: Ambulatory Visit | Attending: Pulmonary Disease | Admitting: Pulmonary Disease

## 2014-10-12 ENCOUNTER — Encounter: Payer: Self-pay | Admitting: *Deleted

## 2014-10-12 ENCOUNTER — Ambulatory Visit: Payer: BC Managed Care – PPO | Admitting: Pulmonary Disease

## 2014-10-12 ENCOUNTER — Ambulatory Visit (INDEPENDENT_AMBULATORY_CARE_PROVIDER_SITE_OTHER): Payer: Medicare Other | Admitting: Pulmonary Disease

## 2014-10-12 ENCOUNTER — Encounter: Payer: Self-pay | Admitting: Pulmonary Disease

## 2014-10-12 ENCOUNTER — Encounter (INDEPENDENT_AMBULATORY_CARE_PROVIDER_SITE_OTHER): Payer: Self-pay

## 2014-10-12 VITALS — BP 120/72 | HR 77 | Temp 97.5°F | Ht 62.0 in | Wt 165.2 lb

## 2014-10-12 DIAGNOSIS — J45909 Unspecified asthma, uncomplicated: Secondary | ICD-10-CM

## 2014-10-12 DIAGNOSIS — J9611 Chronic respiratory failure with hypoxia: Secondary | ICD-10-CM

## 2014-10-12 DIAGNOSIS — J9610 Chronic respiratory failure, unspecified whether with hypoxia or hypercapnia: Secondary | ICD-10-CM | POA: Insufficient documentation

## 2014-10-12 NOTE — Assessment & Plan Note (Signed)
The patient continues to do well from an asthma standpoint on her current bronchodilator regimen. However, we will need to change to a different regimen because of insurance issues. I have given her a list of choices, and she will check her formulary and let us know.

## 2014-10-12 NOTE — Progress Notes (Signed)
   Subjective:    Patient ID: [name], female    DOB: 1950-12-04, 64 y.o.   MRN: [id number]  HPI The patient comes in today for follow-up of her known asthma. She also has chronic respiratory failure secondary to diastolic heart failure, pulmonary hypertension, and a paralyzed hemidiaphragm. She has maintained on her bronchodilator regimen, and has not had a recent acute exacerbation. She has had increasing oxygen needs, but has monitor her fluid balance very carefully. She also tells me that her insurance will no longer cover Symbicort.   Review of Systems  Constitutional: Negative for fever and unexpected weight change.  HENT: Positive for congestion and postnasal drip. Negative for dental problem, ear pain, nosebleeds, rhinorrhea, sinus pressure, sneezing, sore throat and trouble swallowing.   Eyes: Negative for redness and itching.  Respiratory: Positive for shortness of breath. Negative for cough, chest tightness and wheezing.   Cardiovascular: Negative for palpitations and leg swelling.  Gastrointestinal: Negative for nausea and vomiting.  Genitourinary: Negative for dysuria.  Musculoskeletal: Negative for joint swelling.  Skin: Negative for rash.  Neurological: Negative for headaches.  Hematological: Does not bruise/bleed easily.  Psychiatric/Behavioral: Negative for dysphoric mood. The patient is not nervous/anxious.        Objective:   Physical Exam Well-developed female in no acute distress Nose without purulence or discharge noted Neck without lymphadenopathy or thyromegaly Chest totally clear to auscultation, no wheezing Cardiac exam with regular rate and rhythm, 2/6 systolic murmur Lower extremities without edema, no cyanosis Alert and oriented, moves all 4 extremities.       Assessment & Plan:

## 2014-10-12 NOTE — Assessment & Plan Note (Signed)
The patient has chronic respiratory failure that is multifactorial. It is primarily related to her chronic diastolic heart failure with secondary pulmonary hypertension, but she also has a history of a paralyzed hemidiaphragm. She is being followed closely by cardiology, and she is monitoring her fluid balance carefully.

## 2014-10-12 NOTE — Patient Instructions (Signed)
We can change your symbicort to dulera 100 or breo 100.  Check your formulary and see which one. Continue to work in rehab, and stay on your oxygen Keep close watch on your fluid balance followup with Dr. Lamonte Sakai in 75mos.

## 2014-10-20 ENCOUNTER — Encounter (HOSPITAL_COMMUNITY)
Admission: RE | Admit: 2014-10-20 | Discharge: 2014-10-20 | Disposition: A | Discharge status: Home / Self Care | Payer: Self-pay | Source: Ambulatory Visit | Attending: Pulmonary Disease | Admitting: Pulmonary Disease

## 2014-10-26 ENCOUNTER — Telehealth (HOSPITAL_COMMUNITY): Payer: Self-pay | Admitting: Vascular Surgery

## 2014-10-26 NOTE — Telephone Encounter (Signed)
Spoke w/[name], she states she has been having more SOB and edema since [place], she has increased her Lasix from 80 mg in AM and 40 mg in PM to 120 mg in AM and 80 mg in PM since that time and has kept legs elevated, she feels that is helping.  She states edema is just in feet and lower legs, SOB is increased with activity and she has noted at pulm rehab her O2 sats have been a little lower with activity so she has been increasing her oxygen.  She denies heart palps or fluttering.  Appt sch for [facility] 6/2

## 2014-10-26 NOTE — Telephone Encounter (Signed)
Pt left message she wants toe bee seen right away , SOB, swelling and very tired.. Please advise

## 2014-10-28 ENCOUNTER — Encounter (HOSPITAL_COMMUNITY): Payer: Self-pay

## 2014-10-28 ENCOUNTER — Telehealth (HOSPITAL_COMMUNITY): Payer: Self-pay | Admitting: Cardiology

## 2014-10-28 ENCOUNTER — Ambulatory Visit (HOSPITAL_COMMUNITY)
Admission: RE | Admit: 2014-10-28 | Discharge: 2014-10-28 | Disposition: A | Discharge status: Home / Self Care | Payer: Medicare Other | Source: Ambulatory Visit | Attending: Cardiology | Admitting: Cardiology

## 2014-10-28 VITALS — BP 120/68 | HR 68 | Wt 166.8 lb

## 2014-10-28 DIAGNOSIS — Z9981 Dependence on supplemental oxygen: Secondary | ICD-10-CM | POA: Insufficient documentation

## 2014-10-28 DIAGNOSIS — D649 Anemia, unspecified: Secondary | ICD-10-CM | POA: Insufficient documentation

## 2014-10-28 DIAGNOSIS — J45909 Unspecified asthma, uncomplicated: Secondary | ICD-10-CM | POA: Diagnosis not present

## 2014-10-28 DIAGNOSIS — I272 Other secondary pulmonary hypertension: Secondary | ICD-10-CM | POA: Diagnosis not present

## 2014-10-28 DIAGNOSIS — Z7901 Long term (current) use of anticoagulants: Secondary | ICD-10-CM

## 2014-10-28 DIAGNOSIS — E662 Morbid (severe) obesity with alveolar hypoventilation: Secondary | ICD-10-CM | POA: Insufficient documentation

## 2014-10-28 DIAGNOSIS — G4733 Obstructive sleep apnea (adult) (pediatric): Secondary | ICD-10-CM | POA: Insufficient documentation

## 2014-10-28 DIAGNOSIS — I480 Paroxysmal atrial fibrillation: Secondary | ICD-10-CM | POA: Insufficient documentation

## 2014-10-28 DIAGNOSIS — J4520 Mild intermittent asthma, uncomplicated: Secondary | ICD-10-CM

## 2014-10-28 DIAGNOSIS — I5032 Chronic diastolic (congestive) heart failure: Secondary | ICD-10-CM | POA: Insufficient documentation

## 2014-10-28 DIAGNOSIS — J9610 Chronic respiratory failure, unspecified whether with hypoxia or hypercapnia: Secondary | ICD-10-CM | POA: Insufficient documentation

## 2014-10-28 DIAGNOSIS — D594 Other nonautoimmune hemolytic anemias: Secondary | ICD-10-CM

## 2014-10-28 DIAGNOSIS — I270 Primary pulmonary hypertension: Secondary | ICD-10-CM

## 2014-10-28 LAB — PROTIME-INR
INR: 1.38 (ref 0.00–1.49)
Prothrombin Time: 17.1 seconds — ABNORMAL HIGH (ref 11.6–15.2)

## 2014-10-28 LAB — BASIC METABOLIC PANEL
Anion gap: 8 (ref 5–15)
BUN: 14 mg/dL (ref 6–20)
CALCIUM: 9.1 mg/dL (ref 8.9–10.3)
CO2: 31 mmol/L (ref 22–32)
Chloride: 99 mmol/L — ABNORMAL LOW (ref 101–111)
Creatinine, Ser: 0.78 mg/dL (ref 0.44–1.00)
GFR calc Af Amer: >60 mL/min (ref >60)
Glucose, Bld: 104 mg/dL — ABNORMAL HIGH (ref 65–99)
Potassium: 3.4 mmol/L — ABNORMAL LOW (ref 3.5–5.1)
Sodium: 138 mmol/L (ref 135–145)

## 2014-10-28 LAB — CBC
HCT: 29.1 % — ABNORMAL LOW (ref 36.0–46.0)
Hemoglobin: 9.3 g/dL — ABNORMAL LOW (ref 12.0–15.0)
MCH: 35.5 pg — ABNORMAL HIGH (ref 26.0–34.0)
MCHC: 32 g/dL (ref 30.0–36.0)
MCV: 111.1 fL — AB (ref 78.0–100.0)
Platelets: 512 10*3/uL — ABNORMAL HIGH (ref 150–400)
RBC: 2.62 MIL/uL — ABNORMAL LOW (ref 3.87–5.11)
RDW: 14.7 % (ref 11.5–15.5)
WBC: 10 10*3/uL (ref 4.0–10.5)

## 2014-10-28 LAB — BRAIN NATRIURETIC PEPTIDE: B NATRIURETIC PEPTIDE 5: 810.9 pg/mL — AB (ref 0.0–100.0)

## 2014-10-28 NOTE — Progress Notes (Signed)
Patient ID: [name], female   DOB: Dec 05, 1950, 64 y.o.   MRN: [id number] PCP: Dr Inda Merlin   HPI: [name] is a 64 y/o woman with h/o HTN, pyruvate kinase deficiency with hemolytic anemia, obesity, asthma, OSA (on CPAP), mitral valve prolapse s/p mini MV repair/Maze (2011 with Dr. [name]), PAF s/p RFA followed by surgical Maze 0630 and diastolic dysfunction.  Pre-op cath with normal coronaries in 2011. 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   Baseline hgb 7-9. Doesn't get transfused unless 6 or below.  Follow up for PAH and diastolic HF:  Over the last 2 weeks she has had functional decline. Last week she increased lasix to 120 mg twice a day for 3 days but had poor response. .  Increased dyspnea. Has had episodes of presyncope 3-4 times a day and this is new for her.  Continues on CPAP and continuous oxygen. Has not been to pulmonary rehab in over a week due to increased dyspnea.   [place] 06/24/12 RA = 10  RV = 60/7/12  PA = 58/25 (40)  PCW = 19 (no significant v-waves)  Fick cardiac output/index = 6.6/3.7  PVR = 3.1 [place]  FA sat = 91%  PA sat = 66%, 67% PVR was not significantly elevated so selective pulmonary vasodilators were noted started.   Echo 12/13 EF 60-65% mild LVH. Grade 2 DD. MVR stable (no MS noted). Moderate LAE. RV mildly dilated. Mod-severe TR with estimated RVSP 80-90 range (in 2011 was 40-45 mm HG) ECHO 10/22/12 EF 55% RV mildly decreased + Septal bounce. Moderate TR. RVSP 65-70. +diastolic dysfunction (likely restrictive - but tissue Doppler not performed).  ECHO 05/17/2014: EF 50-55%  TEE (4/15): EF 60-65%, severe bilateral atrial enlargement, s/p MV ring with no MR and mild mitral stenosis with meean gradient 6, MVA 1.7 cm^2.  PASP on TTE in 4/15 was 60 mmHg.   Labs (7/14): Potassium 4.6 BUN 16 Creatinine 0.68 hemoglobin 9, LDL 63 Labs (4/15): K 4.3, creatinine 0.9, HCT 26.5 Labs 09/21/13:  K 3.5  Labs 10/21/13 K 4.1 Creatinine  0.76 Labs 7/15: Hgb 9.1  ROS: All systems negative except as listed in HPI, PMH and Problem List.  Past Medical History  Diagnosis Date  . Pyruvate kinase deficiency hemolytic anemia   . Asthma   . Syncope and collapse 2012    r/t anemia  . Anemia   . Dyspnea   . Mitral regurgitation     s/p MV repair complicated by diaphragmatic paralysis  . Fatigue   . Myoclonus   . Heart murmur     severe MR s/p MV repair  . GERD (gastroesophageal reflux disease)   . Allergic rhinitis   . Depression   . Gastritis     NSAID induced  . Asthma   . Hx of asplenia     surgical  . Osteoporosis   . Trochanteric bursitis     left hip  . Pulmonary HTN     mild to moderate by [place] 05/2012  . Exercise hypoxemia     supplemental O2 PRN  . Hypertension   . Atrial fibrillation     s/p RFA and Maze procedure - resolved  . Atrial tachycardia     Current Outpatient Prescriptions  Medication Sig Dispense Refill  . acetaminophen (TYLENOL) 500 MG tablet Take 1,000 mg by mouth every 6 (six) hours as needed for mild pain or fever.    . bisoprolol (ZEBETA) 5 MG  tablet Take 1 tablet (5 mg total) by mouth 2 (two) times daily. 180 tablet 3  . budesonide-formoterol (SYMBICORT) 160-4.5 MCG/ACT inhaler Inhale 2 puffs into the lungs 2 (two) times daily.    . calcium carbonate (OS-CAL) 600 MG TABS Take 600 mg by mouth 2 (two) times daily with a meal.    . clonazePAM (KLONOPIN) 1 MG tablet Take 0.5 mg by mouth at bedtime as needed.     . deferasirox (EXJADE) 500 MG disintegrating tablet Take 3 tablets (1,500 mg total) by mouth daily with supper. Take 3 tablets once daily 30 minutes prior to largest meal 90 tablet 6  . diltiazem (CARTIA XT) 240 MG 24 hr capsule Take 1 capsule (240 mg total) by mouth daily. 30 capsule 3  . ELIQUIS 5 MG TABS tablet TAKE 1 TABLET BY MOUTH TWICE DAILY 60 tablet 3  . EPINEPHrine (EPI-PEN) 0.3 mg/0.3 mL DEVI Inject 0.3 mg into the muscle once as needed.    Marland Kitchen escitalopram (LEXAPRO) 10 MG  tablet Take 5 mg by mouth daily.     . fexofenadine (ALLEGRA) 180 MG tablet Take 180 mg by mouth daily.     . fluticasone (FLONASE) 50 MCG/ACT nasal spray Place 1 spray into both nostrils daily.    . folic acid (FOLVITE) 1 MG tablet Take 1 mg by mouth daily.    . furosemide (LASIX) 40 MG tablet TAKE 2 TABLETS BY MOUTH EVERY MORNING THEN TAKE 1 TABLET IN THE EVENING 270 tablet 0  . guaiFENesin (MUCINEX) 600 MG 12 hr tablet Take 600 mg by mouth 2 (two) times daily as needed for to loosen phlegm.     . Multiple Vitamins-Minerals (AIRBORNE) CHEW Chew 1 tablet by mouth 2 (two) times daily.     . NON FORMULARY Inhale 2 L into the lungs continuous. Oxygen at 2 l/min [place]    . pantoprazole (PROTONIX) 40 MG tablet Take 40 mg by mouth daily.    . potassium chloride SA (K-DUR,KLOR-CON) 20 MEQ tablet Take 2 tablets (40 mEq total) by mouth 2 (two) times daily. 120 tablet 3  . Probiotic Product (ALIGN) 4 MG CAPS Take 1 tablet by mouth daily.    . raloxifene (EVISTA) 60 MG tablet Take 60 mg by mouth daily.    . sodium chloride (OCEAN) 0.65 % nasal spray Place 2 sprays into both nostrils 2 (two) times daily.     . traMADol (ULTRAM) 50 MG tablet Take 50 mg by mouth. 3-4 times daily    . UNABLE TO FIND Med Name: CPAP    . CHLORPHENIRAMINE MALEATE PO Take 4 mg by mouth at bedtime.    . cholecalciferol (VITAMIN D) 1000 UNITS tablet Take 1,000 Units by mouth daily.     No current facility-administered medications for this encounter.   History  Substance Use Topics  . Smoking status: Never Smoker   . Smokeless tobacco: Never Used  . Alcohol Use: Yes     Comment: socially glass red wine monthly   Family History  Problem Relation Age of Onset  . Breast cancer Mother 11    ER+  . Melanoma Father 70  . Adrenal disorder Sister     pheochromacytoma - rt. adrenal gland  . COPD Maternal Aunt   . Stomach cancer Paternal Uncle     onset in 17s  . Lung cancer Maternal Grandmother     died late 13s, heavy smoker   . Heart disease Maternal Grandfather     hardening of the  arteries, poor circulation  . Ovarian cancer Sister 39    stage IV, now 61    Filed Vitals:   10/28/14 1008  BP: 120/68  Pulse: 68  Weight: 166 lb 12 oz (75.637 kg)  SpO2: 97%     PHYSICAL EXAM: General: NAD wearing O2 HEENT: normal Neck: supple. JVP to jaw  Carotids 2+ bilaterally; no bruits. No lymphadenopathy or thryomegaly appreciated. Cor: PMI normal. Regular rate & irregular  rhythm. No rubs, gallops.  2/6 HSM LLSB.  P2 no RV lift  Lungs: clear 2 liters [place], no wheezing or crackles Abdomen: soft, nontender, nondistended. No hepatosplenomegaly. No bruits or masses. Good bowel sounds. Extremities: no cyanosis, clubbing, rash, edema  Neuro: alert & orientedx3, cranial nerves grossly intact. Moves all 4 extremities w/o difficulty. Affect pleasant.  ASSESSMENT & PLAN:  1. Atrial fibrillation: Paroxysmal. Continue apixaban. No bleeding problems noted.  Check CBC. - Continue bisoprolol and diltiazem CD.  - Check EKG now.  2. Pulmonary arterial hypertension: PVR was only 3.1 WU on last RHC. The [place] may be primarily Who Group 3 in the setting of OSA and chronic respiratory failure (on home oxygen, ?OHS).  Functional decline. Increased dyspnea at rest. Check RHC tomorrow. Check Pre cath labs.  -Having episodes of presyncope. Repeat ECHO to assess RV.  - Continue OSA at night and oxygen during the day.  3. Chronic diastolic CHF: Volume status elevated.  - Continue lasix 80 mg in and 40 mg in pm and take an extra 40 mg of lasix tonight. RHC tomorrow and will adjust diuretics regimen based on results.   Reinforced need for daily weights and reviewed use of sliding scale diuretics. - Check BMET.  4. Asthma: Stable. Per pulmonary 5. Anemia - ? Dyspnea/fatigue related to anemia. Check CBC now. .    RHC tomorrow. Follow up in 2 weeks with an ECHO .   Amanda Beshara NP-C 10/28/2014

## 2014-10-28 NOTE — Telephone Encounter (Signed)
Pt scheduled for RHC on 10/29/2014 with [place] [phone] With pts current insurance- Medicare No pre cert required

## 2014-10-28 NOTE — Patient Instructions (Signed)
You are scheduled for a right heart catheterization tomorror at [facility]. See instruction sheet for details.  Follow up 2 weeks with echocardiogram.  Do the following things EVERYDAY: 1) Weigh yourself in the morning before breakfast. Write it down and keep it in a log. 2) Take your medicines as prescribed 3) Eat low salt foods-Limit salt (sodium) to 2000 mg per day.  4) Stay as active as you can everyday 5) Limit all fluids for the day to less than 2 liters

## 2014-10-29 ENCOUNTER — Other Ambulatory Visit (HOSPITAL_COMMUNITY): Payer: Self-pay

## 2014-10-29 ENCOUNTER — Encounter (HOSPITAL_COMMUNITY)
Admission: RE | Disposition: A | Discharge status: Home / Self Care | Payer: Medicare Other | Source: Ambulatory Visit | Attending: Cardiology

## 2014-10-29 ENCOUNTER — Ambulatory Visit (HOSPITAL_COMMUNITY)
Admission: RE | Admit: 2014-10-29 | Discharge: 2014-10-29 | Disposition: A | Discharge status: Home / Self Care | Payer: Medicare Other | Source: Ambulatory Visit | Attending: Cardiology | Admitting: Cardiology

## 2014-10-29 ENCOUNTER — Telehealth: Payer: Self-pay | Admitting: Oncology

## 2014-10-29 DIAGNOSIS — Z79899 Other long term (current) drug therapy: Secondary | ICD-10-CM | POA: Insufficient documentation

## 2014-10-29 DIAGNOSIS — I480 Paroxysmal atrial fibrillation: Secondary | ICD-10-CM | POA: Diagnosis not present

## 2014-10-29 DIAGNOSIS — J9610 Chronic respiratory failure, unspecified whether with hypoxia or hypercapnia: Secondary | ICD-10-CM | POA: Diagnosis not present

## 2014-10-29 DIAGNOSIS — M810 Age-related osteoporosis without current pathological fracture: Secondary | ICD-10-CM | POA: Diagnosis not present

## 2014-10-29 DIAGNOSIS — F1099 Alcohol use, unspecified with unspecified alcohol-induced disorder: Secondary | ICD-10-CM | POA: Insufficient documentation

## 2014-10-29 DIAGNOSIS — I10 Essential (primary) hypertension: Secondary | ICD-10-CM | POA: Diagnosis not present

## 2014-10-29 DIAGNOSIS — I4891 Unspecified atrial fibrillation: Secondary | ICD-10-CM | POA: Diagnosis present

## 2014-10-29 DIAGNOSIS — J45909 Unspecified asthma, uncomplicated: Secondary | ICD-10-CM | POA: Diagnosis not present

## 2014-10-29 DIAGNOSIS — F329 Major depressive disorder, single episode, unspecified: Secondary | ICD-10-CM | POA: Diagnosis not present

## 2014-10-29 DIAGNOSIS — I5032 Chronic diastolic (congestive) heart failure: Secondary | ICD-10-CM | POA: Insufficient documentation

## 2014-10-29 DIAGNOSIS — K219 Gastro-esophageal reflux disease without esophagitis: Secondary | ICD-10-CM | POA: Diagnosis not present

## 2014-10-29 DIAGNOSIS — Z9981 Dependence on supplemental oxygen: Secondary | ICD-10-CM | POA: Diagnosis not present

## 2014-10-29 DIAGNOSIS — G4733 Obstructive sleep apnea (adult) (pediatric): Secondary | ICD-10-CM | POA: Diagnosis not present

## 2014-10-29 DIAGNOSIS — D649 Anemia, unspecified: Secondary | ICD-10-CM | POA: Diagnosis not present

## 2014-10-29 DIAGNOSIS — I272 Other secondary pulmonary hypertension: Secondary | ICD-10-CM | POA: Insufficient documentation

## 2014-10-29 DIAGNOSIS — I509 Heart failure, unspecified: Secondary | ICD-10-CM | POA: Diagnosis not present

## 2014-10-29 HISTORY — PX: CARDIAC CATHETERIZATION: SHX172

## 2014-10-29 LAB — POCT I-STAT 3, VENOUS BLOOD GAS (G3P V)
ACID-BASE EXCESS: 3 mmol/L — AB (ref 0.0–2.0)
ACID-BASE EXCESS: 4 mmol/L — AB (ref 0.0–2.0)
Bicarbonate: 29.7 mEq/L — ABNORMAL HIGH (ref 20.0–24.0)
Bicarbonate: 30.3 mEq/L — ABNORMAL HIGH (ref 20.0–24.0)
O2 Saturation: 66 %
O2 Saturation: 68 %
PCO2 VEN: 55 mmHg — AB (ref 45.0–50.0)
PO2 VEN: 37 mmHg (ref 30.0–45.0)
TCO2: 31 mmol/L (ref 0–100)
TCO2: 32 mmol/L (ref 0–100)
pCO2, Ven: 55 mmHg — ABNORMAL HIGH (ref 45.0–50.0)
pH, Ven: 7.341 — ABNORMAL HIGH (ref 7.250–7.300)
pH, Ven: 7.348 — ABNORMAL HIGH (ref 7.250–7.300)
pO2, Ven: 38 mmHg (ref 30.0–45.0)

## 2014-10-29 LAB — POTASSIUM: POTASSIUM: 3.8 mmol/L (ref 3.5–5.1)

## 2014-10-29 SURGERY — RIGHT HEART CATH
Anesthesia: LOCAL

## 2014-10-29 MED ORDER — ASPIRIN 81 MG PO CHEW
CHEWABLE_TABLET | ORAL | Status: AC
  Filled 2014-10-29: qty 1

## 2014-10-29 MED ORDER — SODIUM CHLORIDE 0.9 % IJ SOLN: 3.0000 mL | INTRAMUSCULAR | Status: DC | PRN

## 2014-10-29 MED ORDER — LIDOCAINE HCL (PF) 1 % IJ SOLN
INTRAMUSCULAR | Status: AC
  Filled 2014-10-29: qty 30

## 2014-10-29 MED ORDER — SPIRONOLACTONE 25 MG PO TABS: 12.5000 mg | ORAL_TABLET | Freq: Every day | ORAL | Status: DC

## 2014-10-29 MED ORDER — SODIUM CHLORIDE 0.9 % IV SOLN: 250.0000 mL | INTRAVENOUS | Status: DC | PRN

## 2014-10-29 MED ORDER — TORSEMIDE 20 MG PO TABS: 80.0000 mg | ORAL_TABLET | Freq: Every day | ORAL | Status: DC

## 2014-10-29 MED ORDER — SODIUM CHLORIDE 0.9 % IJ SOLN: 3.0000 mL | Freq: Two times a day (BID) | INTRAMUSCULAR | Status: DC

## 2014-10-29 MED ORDER — ACETAMINOPHEN 325 MG PO TABS: 650.0000 mg | ORAL_TABLET | ORAL | Status: DC | PRN

## 2014-10-29 MED ORDER — MIDAZOLAM HCL 2 MG/2ML IJ SOLN
INTRAMUSCULAR | Status: DC | PRN
  Administered 2014-10-29: 0.5 mg via INTRAVENOUS

## 2014-10-29 MED ORDER — SODIUM CHLORIDE 0.9 % IV SOLN
INTRAVENOUS | Status: DC | PRN
  Administered 2014-10-29: 10 mL/h via INTRAVENOUS

## 2014-10-29 MED ORDER — HEPARIN (PORCINE) IN NACL 2-0.9 UNIT/ML-% IJ SOLN
INTRAMUSCULAR | Status: AC
  Filled 2014-10-29: qty 500

## 2014-10-29 MED ORDER — ONDANSETRON HCL 4 MG/2ML IJ SOLN: 4.0000 mg | Freq: Four times a day (QID) | INTRAMUSCULAR | Status: DC | PRN

## 2014-10-29 MED ORDER — ASPIRIN 81 MG PO CHEW
81.0000 mg | CHEWABLE_TABLET | ORAL | Status: AC
Start: 2014-10-29 — End: 2014-10-29
  Administered 2014-10-29: 81 mg via ORAL

## 2014-10-29 MED ORDER — MIDAZOLAM HCL 2 MG/2ML IJ SOLN
INTRAMUSCULAR | Status: AC
  Filled 2014-10-29: qty 2

## 2014-10-29 MED ORDER — POTASSIUM CHLORIDE CRYS ER 20 MEQ PO TBCR: 60.0000 meq | EXTENDED_RELEASE_TABLET | Freq: Two times a day (BID) | ORAL | Status: DC

## 2014-10-29 MED ORDER — SODIUM CHLORIDE 0.9 % IV SOLN
INTRAVENOUS | Status: DC
  Administered 2014-10-29: 11:00:00 via INTRAVENOUS

## 2014-10-29 MED ORDER — LIDOCAINE HCL (PF) 1 % IJ SOLN
INTRAMUSCULAR | Status: DC | PRN
  Administered 2014-10-29 (×2): 5 mL via INTRADERMAL

## 2014-10-29 SURGICAL SUPPLY — 8 items
CATH BALLN WEDGE 5F 110CM (CATHETERS) ×2 IMPLANT
KIT HEART RIGHT NAMIC (KITS) ×2 IMPLANT
PACK CARDIAC CATHETERIZATION (CUSTOM PROCEDURE TRAY) ×2 IMPLANT
SHEATH FAST CATH BRACH 5F 5CM (SHEATH) ×3 IMPLANT
TRANSDUCER W/STOPCOCK (MISCELLANEOUS) ×3 IMPLANT
WIRE ASAHI PROWATER 180CM (WIRE) ×1 IMPLANT
WIRE EMERALD 3MM-J .025X260CM (WIRE) ×1 IMPLANT
WIRE HI TORQ VERSACORE-J 145CM (WIRE) ×2 IMPLANT

## 2014-10-29 NOTE — Telephone Encounter (Signed)
Due to PAL moved 6/9 appointments to 6/27. Spoke with patient she is aware.

## 2014-10-29 NOTE — H&P (View-Only) (Signed)
Patient ID: [name], female   DOB: 05/18/1951, 64 y.o.   MRN: [id number] PCP: Dr Inda Merlin   HPI: [name] is a 64 y/o woman with h/o HTN, pyruvate kinase deficiency with hemolytic anemia, obesity, asthma, OSA (on CPAP), mitral valve prolapse s/p mini MV repair/Maze (2011 with Dr. [name]), PAF s/p RFA followed by surgical Maze 3474 and diastolic dysfunction.  Pre-op cath with normal coronaries in 2011. 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   Baseline hgb 7-9. Doesn't get transfused unless 6 or below.  Follow up for PAH and diastolic HF:  Over the last 2 weeks she has had functional decline. Last week she increased lasix to 120 mg twice a day for 3 days but had poor response. .  Increased dyspnea. Has had episodes of presyncope 3-4 times a day and this is new for her.  Continues on CPAP and continuous oxygen. Has not been to pulmonary rehab in over a week due to increased dyspnea.   [place] 06/24/12 RA = 10  RV = 60/7/12  PA = 58/25 (40)  PCW = 19 (no significant v-waves)  Fick cardiac output/index = 6.6/3.7  PVR = 3.1 [place]  FA sat = 91%  PA sat = 66%, 67% PVR was not significantly elevated so selective pulmonary vasodilators were noted started.   Echo 12/13 EF 60-65% mild LVH. Grade 2 DD. MVR stable (no MS noted). Moderate LAE. RV mildly dilated. Mod-severe TR with estimated RVSP 80-90 range (in 2011 was 40-45 mm HG) ECHO 10/22/12 EF 55% RV mildly decreased + Septal bounce. Moderate TR. RVSP 65-70. +diastolic dysfunction (likely restrictive - but tissue Doppler not performed).  ECHO 05/17/2014: EF 50-55%  TEE (4/15): EF 60-65%, severe bilateral atrial enlargement, s/p MV ring with no MR and mild mitral stenosis with meean gradient 6, MVA 1.7 cm^2.  PASP on TTE in 4/15 was 60 mmHg.   Labs (7/14): Potassium 4.6 BUN 16 Creatinine 0.68 hemoglobin 9, LDL 63 Labs (4/15): K 4.3, creatinine 0.9, HCT 26.5 Labs 09/21/13:  K 3.5  Labs 10/21/13 K 4.1 Creatinine  0.76 Labs 7/15: Hgb 9.1  ROS: All systems negative except as listed in HPI, PMH and Problem List.  Past Medical History  Diagnosis Date  . Pyruvate kinase deficiency hemolytic anemia   . Asthma   . Syncope and collapse 2012    r/t anemia  . Anemia   . Dyspnea   . Mitral regurgitation     s/p MV repair complicated by diaphragmatic paralysis  . Fatigue   . Myoclonus   . Heart murmur     severe MR s/p MV repair  . GERD (gastroesophageal reflux disease)   . Allergic rhinitis   . Depression   . Gastritis     NSAID induced  . Asthma   . Hx of asplenia     surgical  . Osteoporosis   . Trochanteric bursitis     left hip  . Pulmonary HTN     mild to moderate by [place] 05/2012  . Exercise hypoxemia     supplemental O2 PRN  . Hypertension   . Atrial fibrillation     s/p RFA and Maze procedure - resolved  . Atrial tachycardia     Current Outpatient Prescriptions  Medication Sig Dispense Refill  . acetaminophen (TYLENOL) 500 MG tablet Take 1,000 mg by mouth every 6 (six) hours as needed for mild pain or fever.    . bisoprolol (ZEBETA) 5 MG  tablet Take 1 tablet (5 mg total) by mouth 2 (two) times daily. 180 tablet 3  . budesonide-formoterol (SYMBICORT) 160-4.5 MCG/ACT inhaler Inhale 2 puffs into the lungs 2 (two) times daily.    . calcium carbonate (OS-CAL) 600 MG TABS Take 600 mg by mouth 2 (two) times daily with a meal.    . clonazePAM (KLONOPIN) 1 MG tablet Take 0.5 mg by mouth at bedtime as needed.     . deferasirox (EXJADE) 500 MG disintegrating tablet Take 3 tablets (1,500 mg total) by mouth daily with supper. Take 3 tablets once daily 30 minutes prior to largest meal 90 tablet 6  . diltiazem (CARTIA XT) 240 MG 24 hr capsule Take 1 capsule (240 mg total) by mouth daily. 30 capsule 3  . ELIQUIS 5 MG TABS tablet TAKE 1 TABLET BY MOUTH TWICE DAILY 60 tablet 3  . EPINEPHrine (EPI-PEN) 0.3 mg/0.3 mL DEVI Inject 0.3 mg into the muscle once as needed.    Marland Kitchen escitalopram (LEXAPRO) 10 MG  tablet Take 5 mg by mouth daily.     . fexofenadine (ALLEGRA) 180 MG tablet Take 180 mg by mouth daily.     . fluticasone (FLONASE) 50 MCG/ACT nasal spray Place 1 spray into both nostrils daily.    . folic acid (FOLVITE) 1 MG tablet Take 1 mg by mouth daily.    . furosemide (LASIX) 40 MG tablet TAKE 2 TABLETS BY MOUTH EVERY MORNING THEN TAKE 1 TABLET IN THE EVENING 270 tablet 0  . guaiFENesin (MUCINEX) 600 MG 12 hr tablet Take 600 mg by mouth 2 (two) times daily as needed for to loosen phlegm.     . Multiple Vitamins-Minerals (AIRBORNE) CHEW Chew 1 tablet by mouth 2 (two) times daily.     . NON FORMULARY Inhale 2 L into the lungs continuous. Oxygen at 2 l/min [place]    . pantoprazole (PROTONIX) 40 MG tablet Take 40 mg by mouth daily.    . potassium chloride SA (K-DUR,KLOR-CON) 20 MEQ tablet Take 2 tablets (40 mEq total) by mouth 2 (two) times daily. 120 tablet 3  . Probiotic Product (ALIGN) 4 MG CAPS Take 1 tablet by mouth daily.    . raloxifene (EVISTA) 60 MG tablet Take 60 mg by mouth daily.    . sodium chloride (OCEAN) 0.65 % nasal spray Place 2 sprays into both nostrils 2 (two) times daily.     . traMADol (ULTRAM) 50 MG tablet Take 50 mg by mouth. 3-4 times daily    . UNABLE TO FIND Med Name: CPAP    . CHLORPHENIRAMINE MALEATE PO Take 4 mg by mouth at bedtime.    . cholecalciferol (VITAMIN D) 1000 UNITS tablet Take 1,000 Units by mouth daily.     No current facility-administered medications for this encounter.   History  Substance Use Topics  . Smoking status: Never Smoker   . Smokeless tobacco: Never Used  . Alcohol Use: Yes     Comment: socially glass red wine monthly   Family History  Problem Relation Age of Onset  . Breast cancer Mother 70    ER+  . Melanoma Father 70  . Adrenal disorder Sister     pheochromacytoma - rt. adrenal gland  . COPD Maternal Aunt   . Stomach cancer Paternal Uncle     onset in 34s  . Lung cancer Maternal Grandmother     died late 85s, heavy smoker   . Heart disease Maternal Grandfather     hardening of the  arteries, poor circulation  . Ovarian cancer Sister 5    stage IV, now 84    Filed Vitals:   10/28/14 1008  BP: 120/68  Pulse: 68  Weight: 166 lb 12 oz (75.637 kg)  SpO2: 97%     PHYSICAL EXAM: General: NAD wearing O2 HEENT: normal Neck: supple. JVP to jaw  Carotids 2+ bilaterally; no bruits. No lymphadenopathy or thryomegaly appreciated. Cor: PMI normal. Regular rate & irregular  rhythm. No rubs, gallops.  2/6 HSM LLSB.  P2 no RV lift  Lungs: clear 2 liters [place], no wheezing or crackles Abdomen: soft, nontender, nondistended. No hepatosplenomegaly. No bruits or masses. Good bowel sounds. Extremities: no cyanosis, clubbing, rash, edema  Neuro: alert & orientedx3, cranial nerves grossly intact. Moves all 4 extremities w/o difficulty. Affect pleasant.  ASSESSMENT & PLAN:  1. Atrial fibrillation: Paroxysmal. Continue apixaban. No bleeding problems noted.  Check CBC. - Continue bisoprolol and diltiazem CD.  - Check EKG now.  2. Pulmonary arterial hypertension: PVR was only 3.1 WU on last RHC. The [place] may be primarily Who Group 3 in the setting of OSA and chronic respiratory failure (on home oxygen, ?OHS).  Functional decline. Increased dyspnea at rest. Check RHC tomorrow. Check Pre cath labs.  -Having episodes of presyncope. Repeat ECHO to assess RV.  - Continue OSA at night and oxygen during the day.  3. Chronic diastolic CHF: Volume status elevated.  - Continue lasix 80 mg in and 40 mg in pm and take an extra 40 mg of lasix tonight. RHC tomorrow and will adjust diuretics regimen based on results.   Reinforced need for daily weights and reviewed use of sliding scale diuretics. - Check BMET.  4. Asthma: Stable. Per pulmonary 5. Anemia - ? Dyspnea/fatigue related to anemia. Check CBC now. .    RHC tomorrow. Follow up in 2 weeks with an ECHO .   Sabine Tenenbaum NP-C 10/28/2014

## 2014-10-29 NOTE — Patient Instructions (Addendum)
STOP Lasix.  START torsemide 80 mg (4 tablets) once daily.  START Spironolactone 12.5 mg (1/2 tablet) once daily.  INCREASE Potassium to 60 meq (3 tablets) twice daily.  Prescriptions sent to [place] at [facility].  Come to [facility] next Tuesday June 7th for labs any time 8am-4pm.  Do the following things EVERYDAY: 1) Weigh yourself in the morning before breakfast. Write it down and keep it in a log. 2) Take your medicines as prescribed 3) Eat low salt foods-Limit salt (sodium) to 2000 mg per day.  4) Stay as active as you can everyday 5) Limit all fluids for the day to less than 2 liters.

## 2014-10-29 NOTE — Interval H&P Note (Signed)
History and Physical Interval Note:  10/29/2014 2:44 PM  [name]  has presented today for surgery, with the diagnosis of chf  The various methods of treatment have been discussed with the patient and family. After consideration of risks, benefits and other options for treatment, the patient has consented to  Procedure(s): Right Heart Cath (N/A) as a surgical intervention .  The patient's history has been reviewed, patient examined, no change in status, stable for surgery.  I have reviewed the patient's chart and labs.  Questions were answered to the patient's satisfaction.     [name] [employer]

## 2014-11-01 ENCOUNTER — Encounter (HOSPITAL_COMMUNITY): Payer: Self-pay | Admitting: Cardiology

## 2014-11-02 ENCOUNTER — Ambulatory Visit (HOSPITAL_COMMUNITY)
Admission: RE | Admit: 2014-11-02 | Discharge: 2014-11-02 | Disposition: A | Discharge status: Home / Self Care | Payer: Medicare Other | Source: Ambulatory Visit | Attending: Internal Medicine | Admitting: Internal Medicine

## 2014-11-02 DIAGNOSIS — I5032 Chronic diastolic (congestive) heart failure: Secondary | ICD-10-CM | POA: Diagnosis not present

## 2014-11-02 DIAGNOSIS — I5022 Chronic systolic (congestive) heart failure: Secondary | ICD-10-CM | POA: Diagnosis not present

## 2014-11-02 LAB — BASIC METABOLIC PANEL
ANION GAP: 9 (ref 5–15)
BUN: 20 mg/dL (ref 6–20)
CALCIUM: 9.2 mg/dL (ref 8.9–10.3)
CO2: 30 mmol/L (ref 22–32)
Chloride: 97 mmol/L — ABNORMAL LOW (ref 101–111)
Creatinine, Ser: 0.88 mg/dL (ref 0.44–1.00)
GFR calc non Af Amer: >60 mL/min (ref >60)
Glucose, Bld: 113 mg/dL — ABNORMAL HIGH (ref 65–99)
Potassium: 3.9 mmol/L (ref 3.5–5.1)
Sodium: 136 mmol/L (ref 135–145)

## 2014-11-02 MED FILL — Heparin Sodium (Porcine) 2 Unit/ML in Sodium Chloride 0.9%: INTRAMUSCULAR | Qty: 1000 | Status: AC

## 2014-11-04 ENCOUNTER — Ambulatory Visit: Payer: Medicare Other | Admitting: Oncology

## 2014-11-04 ENCOUNTER — Other Ambulatory Visit: Payer: Medicare Other

## 2014-11-08 ENCOUNTER — Encounter (HOSPITAL_COMMUNITY)
Admission: RE | Admit: 2014-11-08 | Discharge: 2014-11-08 | Disposition: A | Discharge status: Home / Self Care | Payer: Self-pay | Source: Ambulatory Visit | Attending: Critical Care Medicine | Admitting: Critical Care Medicine

## 2014-11-08 DIAGNOSIS — I272 Other secondary pulmonary hypertension: Secondary | ICD-10-CM | POA: Insufficient documentation

## 2014-11-10 ENCOUNTER — Encounter (HOSPITAL_COMMUNITY): Payer: Self-pay

## 2014-11-10 ENCOUNTER — Ambulatory Visit (HOSPITAL_COMMUNITY)
Admission: RE | Admit: 2014-11-10 | Discharge: 2014-11-10 | Disposition: A | Discharge status: Home / Self Care | Payer: Medicare Other | Source: Ambulatory Visit | Attending: Oncology | Admitting: Oncology

## 2014-11-10 ENCOUNTER — Ambulatory Visit (HOSPITAL_BASED_OUTPATIENT_CLINIC_OR_DEPARTMENT_OTHER)
Admission: RE | Admit: 2014-11-10 | Discharge: 2014-11-10 | Disposition: A | Discharge status: Home / Self Care | Payer: Medicare Other | Source: Ambulatory Visit | Attending: Cardiology | Admitting: Cardiology

## 2014-11-10 ENCOUNTER — Encounter (HOSPITAL_COMMUNITY)
Admission: RE | Admit: 2014-11-10 | Discharge: 2014-11-10 | Disposition: A | Discharge status: Home / Self Care | Payer: Self-pay | Source: Ambulatory Visit | Attending: Pulmonary Disease | Admitting: Pulmonary Disease

## 2014-11-10 VITALS — BP 108/64 | HR 86 | Wt 155.8 lb

## 2014-11-10 DIAGNOSIS — E669 Obesity, unspecified: Secondary | ICD-10-CM | POA: Diagnosis not present

## 2014-11-10 DIAGNOSIS — G4733 Obstructive sleep apnea (adult) (pediatric): Secondary | ICD-10-CM | POA: Diagnosis not present

## 2014-11-10 DIAGNOSIS — I272 Other secondary pulmonary hypertension: Secondary | ICD-10-CM | POA: Insufficient documentation

## 2014-11-10 DIAGNOSIS — J45909 Unspecified asthma, uncomplicated: Secondary | ICD-10-CM | POA: Diagnosis not present

## 2014-11-10 DIAGNOSIS — Z9981 Dependence on supplemental oxygen: Secondary | ICD-10-CM | POA: Diagnosis not present

## 2014-11-10 DIAGNOSIS — I340 Nonrheumatic mitral (valve) insufficiency: Secondary | ICD-10-CM | POA: Diagnosis not present

## 2014-11-10 DIAGNOSIS — I5032 Chronic diastolic (congestive) heart failure: Secondary | ICD-10-CM | POA: Diagnosis not present

## 2014-11-10 DIAGNOSIS — I4891 Unspecified atrial fibrillation: Secondary | ICD-10-CM | POA: Insufficient documentation

## 2014-11-10 DIAGNOSIS — I10 Essential (primary) hypertension: Secondary | ICD-10-CM | POA: Insufficient documentation

## 2014-11-10 DIAGNOSIS — K219 Gastro-esophageal reflux disease without esophagitis: Secondary | ICD-10-CM | POA: Diagnosis not present

## 2014-11-10 DIAGNOSIS — Z79899 Other long term (current) drug therapy: Secondary | ICD-10-CM | POA: Diagnosis not present

## 2014-11-10 DIAGNOSIS — D551 Anemia due to other disorders of glutathione metabolism: Secondary | ICD-10-CM | POA: Diagnosis not present

## 2014-11-10 DIAGNOSIS — I071 Rheumatic tricuspid insufficiency: Secondary | ICD-10-CM | POA: Diagnosis not present

## 2014-11-10 DIAGNOSIS — I270 Primary pulmonary hypertension: Secondary | ICD-10-CM | POA: Diagnosis not present

## 2014-11-10 DIAGNOSIS — J9611 Chronic respiratory failure with hypoxia: Secondary | ICD-10-CM

## 2014-11-10 LAB — BASIC METABOLIC PANEL
ANION GAP: 9 (ref 5–15)
BUN: 19 mg/dL (ref 6–20)
CHLORIDE: 99 mmol/L — AB (ref 101–111)
CO2: 31 mmol/L (ref 22–32)
Calcium: 9.9 mg/dL (ref 8.9–10.3)
Creatinine, Ser: 0.95 mg/dL (ref 0.44–1.00)
GFR calc Af Amer: >60 mL/min (ref >60)
GFR calc non Af Amer: >60 mL/min (ref >60)
GLUCOSE: 113 mg/dL — AB (ref 65–99)
POTASSIUM: 3.8 mmol/L (ref 3.5–5.1)
Sodium: 139 mmol/L (ref 135–145)

## 2014-11-10 LAB — BRAIN NATRIURETIC PEPTIDE: B Natriuretic Peptide: 296.8 pg/mL — ABNORMAL HIGH (ref 0.0–100.0)

## 2014-11-10 NOTE — Progress Notes (Signed)
  Echocardiogram 2D Echocardiogram has been performed.  [name] 11/10/2014, 11:01 AM

## 2014-11-10 NOTE — Progress Notes (Signed)
Patient ID: [name], female   DOB: 05-Jul-1950, 64 y.o.   MRN: [id number] PCP: Dr Inda Merlin   HPI: [name] is a 64 y/o woman with h/o HTN, pyruvate kinase deficiency with hemolytic anemia, obesity, asthma, OSA (on CPAP), mitral valve prolapse s/p mini MV repair/Maze (2011 with Dr. [name]), PAF s/p RFA followed by surgical Maze 4315 and diastolic dysfunction.  Pre-op cath with normal coronaries in 2011. 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   Baseline hgb 7-9. Doesn't get transfused unless 6 or below.  At last appointment, she reported increased dyspnea.  I did a right heart cath last week, see below.  Filling pressures were elevated, and I replaced Lasix with torsemide.  Weight is down 11 lbs.  Continues on CPAP and continuous oxygen.  She feels better and can walk for about 10 minutes on a track.  She has chronic 2 pillow orthopnea.  No chest pain, no lightheadedness.  No tachypalpitations.  Echo was done today (see below).   [place] 06/24/12 RA = 10  RV = 60/7/12  PA = 58/25 (40)  PCW = 19 (no significant v-waves)  Fick cardiac output/index = 6.6/3.7  PVR = 3.1 [place]  FA sat = 91%  PA sat = 66%, 67% PVR was not significantly elevated so selective pulmonary vasodilators were noted started.   RHC 6/16 RA = 17 PA = 74/31 (46) PCWP = 27 CI 3.89 PVR 2.8 WU  Echo 12/13 EF 60-65% mild LVH. Grade 2 DD. MVR stable (no MS noted). Moderate LAE. RV mildly dilated. Mod-severe TR with estimated RVSP 80-90 range (in 2011 was 40-45 mm HG) ECHO 10/22/12 EF 55% RV mildly decreased + Septal bounce. Moderate TR. RVSP 65-70. +diastolic dysfunction (likely restrictive - but tissue Doppler not performed).  ECHO 05/17/2014: EF 50-55%  TEE (4/15): EF 60-65%, severe bilateral atrial enlargement, s/p MV ring with no MR and mild mitral stenosis with meean gradient 6, MVA 1.7 cm^2.  PASP on TTE in 4/15 was 60 mmHg.  Echo (6/16): EF 50-55%, moderate diastolic dysfunction, D-shaped  interventricular septum, moderately dilated RV with mildly decreased systolic function, PA systolic pressure 60 mmHg, IVC not dilated, s/p MV repair with mild MS and trivial MR.   Labs (7/14): Potassium 4.6 BUN 16 Creatinine 0.68 hemoglobin 9, LDL 63 Labs (4/15): K 4.3, creatinine 0.9, HCT 26.5 Labs 09/21/13:  K 3.5  Labs 10/21/13 K 4.1 Creatinine 0.76 Labs 7/15: Hgb 9.1 Labs (6/16): K 3.9, creatinine 0.88, HCT 29.1, plts 512, BNP 811  ROS: All systems negative except as listed in HPI, PMH and Problem List.  Past Medical History  Diagnosis Date  . Pyruvate kinase deficiency hemolytic anemia   . Asthma   . Syncope and collapse 2012    r/t anemia  . Anemia   . Dyspnea   . Mitral regurgitation     s/p MV repair complicated by diaphragmatic paralysis  . Fatigue   . Myoclonus   . Heart murmur     severe MR s/p MV repair  . GERD (gastroesophageal reflux disease)   . Allergic rhinitis   . Depression   . Gastritis     NSAID induced  . Asthma   . Hx of asplenia     surgical  . Osteoporosis   . Trochanteric bursitis     left hip  . Pulmonary HTN     mild to moderate by [place] 05/2012  . Exercise hypoxemia  supplemental O2 PRN  . Hypertension   . Atrial fibrillation     s/p RFA and Maze procedure - resolved  . Atrial tachycardia     Current Outpatient Prescriptions  Medication Sig Dispense Refill  . acetaminophen (TYLENOL) 500 MG tablet Take 1,000 mg by mouth every 6 (six) hours as needed for mild pain or fever.    . bisoprolol (ZEBETA) 5 MG tablet Take 1 tablet (5 mg total) by mouth 2 (two) times daily. 180 tablet 3  . budesonide-formoterol (SYMBICORT) 160-4.5 MCG/ACT inhaler Inhale 2 puffs into the lungs 2 (two) times daily.    . calcium carbonate (OS-CAL) 600 MG TABS Take 600 mg by mouth 2 (two) times daily with a meal.    . CHLORPHENIRAMINE MALEATE PO Take 4 mg by mouth at bedtime.    . cholecalciferol (VITAMIN D) 1000 UNITS tablet Take 1,000 Units by mouth daily.    .  clonazePAM (KLONOPIN) 1 MG tablet Take 0.5 mg by mouth at bedtime as needed for anxiety.     Marland Kitchen deferasirox (EXJADE) 500 MG disintegrating tablet Take 3 tablets (1,500 mg total) by mouth daily with supper. Take 3 tablets once daily 30 minutes prior to largest meal 90 tablet 6  . diltiazem (CARTIA XT) 240 MG 24 hr capsule Take 1 capsule (240 mg total) by mouth daily. 30 capsule 3  . ELIQUIS 5 MG TABS tablet TAKE 1 TABLET BY MOUTH TWICE DAILY 60 tablet 3  . escitalopram (LEXAPRO) 10 MG tablet Take 5 mg by mouth daily.     . fexofenadine (ALLEGRA) 180 MG tablet Take 180 mg by mouth daily.     . fluticasone (FLONASE) 50 MCG/ACT nasal spray Place 1 spray into both nostrils daily.    . folic acid (FOLVITE) 1 MG tablet Take 1 mg by mouth daily.    . Multiple Vitamins-Minerals (AIRBORNE) CHEW Chew 1 tablet by mouth 2 (two) times daily.     . NON FORMULARY Inhale 2 L into the lungs continuous. Oxygen at 2 l/min [place]    . pantoprazole (PROTONIX) 40 MG tablet Take 40 mg by mouth daily.    . potassium chloride SA (KLOR-CON M20) 20 MEQ tablet Take 3 tablets (60 mEq total) by mouth 2 (two) times daily. 180 tablet 6  . Probiotic Product (ALIGN) 4 MG CAPS Take 1 tablet by mouth daily.    . raloxifene (EVISTA) 60 MG tablet Take 60 mg by mouth daily.    . sodium chloride (OCEAN) 0.65 % nasal spray Place 2 sprays into both nostrils 2 (two) times daily.     Marland Kitchen spironolactone (ALDACTONE) 25 MG tablet Take 0.5 tablets (12.5 mg total) by mouth daily. 45 tablet 3  . torsemide (DEMADEX) 20 MG tablet Take 4 tablets (80 mg total) by mouth daily. 120 tablet 6  . traMADol (ULTRAM) 50 MG tablet Take 50 mg by mouth. 3-4 times daily    . UNABLE TO FIND Med Name: CPAP    . EPINEPHrine (EPI-PEN) 0.3 mg/0.3 mL DEVI Inject 0.3 mg into the muscle once as needed.    Marland Kitchen guaiFENesin (MUCINEX) 600 MG 12 hr tablet Take 600 mg by mouth 2 (two) times daily as needed for to loosen phlegm.      No current facility-administered medications for  this encounter.   History  Substance Use Topics  . Smoking status: Never Smoker   . Smokeless tobacco: Never Used  . Alcohol Use: Yes     Comment: socially glass red wine monthly  Family History  Problem Relation Age of Onset  . Breast cancer Mother 60    ER+  . Melanoma Father 31  . Adrenal disorder Sister     pheochromacytoma - rt. adrenal gland  . COPD Maternal Aunt   . Stomach cancer Paternal Uncle     onset in 44s  . Lung cancer Maternal Grandmother     died late 6s, heavy smoker  . Heart disease Maternal Grandfather     hardening of the arteries, poor circulation  . Ovarian cancer Sister 56    stage IV, now 49    Filed Vitals:   11/10/14 1103  BP: 108/64  Pulse: 86  Weight: 155 lb 12 oz (70.648 kg)  SpO2: 96%     PHYSICAL EXAM: General: NAD wearing O2 HEENT: normal Neck: supple. JVP not elevated.  Carotids 2+ bilaterally; no bruits. No lymphadenopathy or thryomegaly appreciated. Cor: PMI normal. Regular S1S2. No rubs, gallops.  2/6 HSM LLSB.  Loud P2 no RV lift  Lungs: clear 2 liters [place], no wheezing or crackles Abdomen: soft, nontender, nondistended. No hepatosplenomegaly. No bruits or masses. Good bowel sounds. Extremities: no cyanosis, clubbing, rash, edema  Neuro: alert & orientedx3, cranial nerves grossly intact. Moves all 4 extremities w/o difficulty. Affect pleasant.  ASSESSMENT & PLAN:  1. Atrial fibrillation: Paroxysmal. Continue apixaban. No bleeding problems noted.  CBC has been stable. - Continue bisoprolol and diltiazem CD.  2. Pulmonary arterial hypertension: PVR was only 2.8 WU on 6/16 RHC. The PAH appears to be primarily Group 3 in the setting of OSA and chronic respiratory failure (on home oxygen, ?OHS).  Today's echo was reviewed, the RV is moderately dilated with mildly decreased systolic function.  - I do not thin that there is a role for pulmonary vasodilators.  - Continue OSA at night and oxygen during the day.  3. Chronic diastolic  CHF: Volume status much improved on torsemide.   - Continue torsemide 80 mg daily.  BMET/BNP today.  4. OHS/OSA: Stable on home oxygen and CPAP. She will be seeing Dr Lamonte Sakai for pulmonary followup.  5. 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  This has been stable.   6. S/p MV repair: Stable by echo today with mild stenosis and no significant regurgitation.   Loralie Champagne  11/10/2014

## 2014-11-10 NOTE — Progress Notes (Signed)
Advanced Heart Failure Medication Review by a Pharmacist  Does the patient  feel that his/her medications are working for him/her?  yes  Has the patient been experiencing any side effects to the medications prescribed?  no  Does the patient measure his/her own blood pressure or blood glucose at home?  no   Does the patient have any problems obtaining medications due to transportation or finances?   no  Understanding of regimen: excellent Understanding of indications: excellent Potential of compliance: excellent    Pharmacist comments: Patient presents to HF clinic and medications were reviewed with a pharmacist. No medication discrepancies noted at this time. Patient has been complaining of insomnia, has already tried multiple medications Discussed sleep hygiene with patient and screened meds - patient not on any that are stimulating.   Maryetta Shafer E. Shawnta Zimbelman, Pharm.D Clinical Pharmacy Resident Pager: [phone] 11/10/2014 11:20 AM

## 2014-11-10 NOTE — Patient Instructions (Signed)
Routine lab work today. Will notify you of abnormal results, otherwise no news is good news!  Follow up 1 month.  Do the following things EVERYDAY: 1) Weigh yourself in the morning before breakfast. Write it down and keep it in a log. 2) Take your medicines as prescribed 3) Eat low salt foods-Limit salt (sodium) to 2000 mg per day.  4) Stay as active as you can everyday 5) Limit all fluids for the day to less than 2 liters

## 2014-11-15 ENCOUNTER — Encounter (HOSPITAL_COMMUNITY)
Admission: RE | Admit: 2014-11-15 | Discharge: 2014-11-15 | Disposition: A | Discharge status: Home / Self Care | Payer: Self-pay | Source: Ambulatory Visit | Attending: Pulmonary Disease | Admitting: Pulmonary Disease

## 2014-11-18 ENCOUNTER — Encounter (HOSPITAL_COMMUNITY): Payer: Medicare Other

## 2014-11-19 ENCOUNTER — Ambulatory Visit (INDEPENDENT_AMBULATORY_CARE_PROVIDER_SITE_OTHER): Payer: Medicare Other | Admitting: Emergency Medicine

## 2014-11-19 ENCOUNTER — Encounter: Payer: Self-pay | Admitting: Emergency Medicine

## 2014-11-19 VITALS — BP 120/62 | HR 87 | Wt 144.0 lb

## 2014-11-19 DIAGNOSIS — J4521 Mild intermittent asthma with (acute) exacerbation: Secondary | ICD-10-CM

## 2014-11-19 DIAGNOSIS — J4520 Mild intermittent asthma, uncomplicated: Secondary | ICD-10-CM | POA: Diagnosis not present

## 2014-11-19 MED ORDER — LEVOFLOXACIN 500 MG PO TABS: 500.0000 mg | ORAL_TABLET | Freq: Every day | ORAL | Status: DC

## 2014-11-19 NOTE — Progress Notes (Signed)
   Subjective:    Patient ID: [name], female    DOB: Feb 17, 1951, 64 y.o.   MRN: [id number]  HPI The patient comes in today for follow-up of her known asthma. She also has chronic respiratory failure secondary to diastolic heart failure, pulmonary hypertension, and a paralyzed hemidiaphragm. She has maintained on her bronchodilator regimen, and has not had a recent acute exacerbation. She has had increasing oxygen needs, but has monitor her fluid balance very carefully. She also tells me that her insurance will no longer cover Symbicort.  Acute office visit 11/19/14 -- the patient has been followed by Dr. 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 Reports that she just had her diuretics increased recently by Dr Haroldine Laws.  She presents today complaining of nasal and sinus drainage, then sore throat, cough. Throat fullness and globus sensation. Developed some fever. She has remained on Symbicort, is about to run out. She is taking allegra, flonase, protonix. She added mucinex. Her cough has become productive, green to brown. No increase in wheeze or SABA use.    Review of Systems  Constitutional: Negative for fever and unexpected weight change.  HENT: Positive for congestion and postnasal drip. Negative for dental problem, ear pain, nosebleeds, rhinorrhea, sinus pressure, sneezing, sore throat and trouble swallowing.   Eyes: Negative for redness and itching.  Respiratory: Positive for shortness of breath. Negative for cough, chest tightness and wheezing.   Cardiovascular: Negative for palpitations and leg swelling.  Gastrointestinal: Negative for nausea and vomiting.  Genitourinary: Negative for dysuria.  Musculoskeletal: Negative for joint swelling.  Skin: Negative for rash.  Neurological: Negative for headaches.  Hematological: Does not bruise/bleed easily.  Psychiatric/Behavioral: Negative for dysphoric mood. The  patient is not nervous/anxious.        Objective:   Physical Exam  Filed Vitals:   11/19/14 1618  BP: 120/62  Pulse: 87  Weight: 65.318 kg (144 lb)  SpO2: 93%    Well-developed female in no acute distress Nose without purulence or discharge noted Neck without lymphadenopathy or thyromegaly Chest totally clear to auscultation, no wheezing Cardiac exam with regular rate and rhythm, 2/6 systolic murmur Lower extremities without edema, no cyanosis Alert and oriented, moves all 4 extremities.      Assessment & Plan:  Acute asthmatic bronchitis Please take levaquin 500mg  daily for 7 days.  Try using OTC decongestants that phenylephrine or dextromethorphan  You may also continue mucinex   Asthma, intrinsic Please continue your Symbicort. We will need to change this to an alternative in the future, probably Breo.  Follow with Dr Lamonte Sakai in 1 month

## 2014-11-19 NOTE — Patient Instructions (Addendum)
Please take levaquin 500mg  daily for 7 days.  Try using OTC decongestants that phenylephrine or dextromethorphan  You may also continue mucinex Please continue your Symbicort. We will need to change this to an alternative in the future, probably Breo.  Follow with Dr Lamonte Sakai in 1 month

## 2014-11-22 ENCOUNTER — Ambulatory Visit: Payer: Medicare Other | Admitting: Oncology

## 2014-11-22 ENCOUNTER — Telehealth: Payer: Self-pay | Admitting: Oncology

## 2014-11-22 ENCOUNTER — Other Ambulatory Visit: Payer: Medicare Other

## 2014-11-22 NOTE — Telephone Encounter (Signed)
Pt called to r/s due to being sick, pt confirmed D/T and will mail out schedule, sent msg to MD visit is for a f/u with medication deferasirox (EXJADE) 500 MG disintegrating tablet that is not covered by insurance, pt does have enough until next visit.... KJ

## 2014-12-01 ENCOUNTER — Encounter (HOSPITAL_COMMUNITY)
Admission: RE | Admit: 2014-12-01 | Discharge: 2014-12-01 | Disposition: A | Discharge status: Home / Self Care | Payer: Self-pay | Source: Ambulatory Visit | Attending: Critical Care Medicine | Admitting: Critical Care Medicine

## 2014-12-01 DIAGNOSIS — I272 Other secondary pulmonary hypertension: Secondary | ICD-10-CM | POA: Insufficient documentation

## 2014-12-03 ENCOUNTER — Encounter (HOSPITAL_COMMUNITY): Payer: Self-pay

## 2014-12-06 ENCOUNTER — Ambulatory Visit (HOSPITAL_BASED_OUTPATIENT_CLINIC_OR_DEPARTMENT_OTHER): Payer: Medicare Other | Admitting: Oncology

## 2014-12-06 ENCOUNTER — Other Ambulatory Visit (HOSPITAL_BASED_OUTPATIENT_CLINIC_OR_DEPARTMENT_OTHER): Payer: Medicare Other

## 2014-12-06 ENCOUNTER — Telehealth: Payer: Self-pay | Admitting: Oncology

## 2014-12-06 VITALS — BP 135/63 | HR 65 | Temp 100.3°F | Resp 20 | Ht 62.0 in | Wt 158.4 lb

## 2014-12-06 DIAGNOSIS — D559 Anemia due to enzyme disorder, unspecified: Secondary | ICD-10-CM

## 2014-12-06 DIAGNOSIS — D649 Anemia, unspecified: Secondary | ICD-10-CM

## 2014-12-06 LAB — CBC WITH DIFFERENTIAL/PLATELET
BASO%: 1.3 % (ref 0.0–2.0)
Basophils Absolute: 0.1 10*3/uL (ref 0.0–0.1)
EOS%: 5.5 % (ref 0.0–7.0)
Eosinophils Absolute: 0.4 10*3/uL (ref 0.0–0.5)
HCT: 27.5 % — ABNORMAL LOW (ref 34.8–46.6)
HGB: 8.9 g/dL — ABNORMAL LOW (ref 11.6–15.9)
LYMPH%: 30.3 % (ref 14.0–49.7)
MCH: 36.8 pg — AB (ref 25.1–34.0)
MCHC: 32.5 g/dL (ref 31.5–36.0)
MCV: 113.4 fL — ABNORMAL HIGH (ref 79.5–101.0)
MONO#: 1.9 10*3/uL — ABNORMAL HIGH (ref 0.1–0.9)
MONO%: 24.1 % — ABNORMAL HIGH (ref 0.0–14.0)
NEUT%: 38.8 % (ref 38.4–76.8)
NEUTROS ABS: 3.1 10*3/uL (ref 1.5–6.5)
Platelets: 506 10*3/uL — ABNORMAL HIGH (ref 145–400)
RBC: 2.43 10*6/uL — ABNORMAL LOW (ref 3.70–5.45)
RDW: 16.5 % — AB (ref 11.2–14.5)
WBC: 8 10*3/uL (ref 3.9–10.3)
lymph#: 2.4 10*3/uL (ref 0.9–3.3)

## 2014-12-06 LAB — FERRITIN CHCC: Ferritin: 55 ng/ml (ref 9–269)

## 2014-12-06 NOTE — Progress Notes (Signed)
  [facility] OFFICE PROGRESS NOTE   Diagnosis: Hereditary pyruvate kinase deficiency  INTERVAL HISTORY:   [name] returns after missing a scheduled visit. She reports being diagnosed with bronchitis last month and treated with a course of Levaquin. She had a fever at the time. No recent fever. She continues Exjade. She lost her insurance, but has a supply of Exjade on hand that will last 4 months. She began taking Exjade every other day after the office visit here in March.  Objective:  Vital signs in last 24 hours:  Blood pressure 135/63, pulse 65, temperature 100.3 F (37.9 C), temperature source Oral, resp. rate 20, height 5\' 2"  (1.575 m), weight 158 lb 6.4 oz (71.85 kg), SpO2 92 %.   Resp: End inspiratory coarse rhonchi at the left posterior base, no respiratory distress Cardio: Irregular GI: No hepatomegaly, nontender Vascular: No leg edema  Lab Results:  Lab Results  Component Value Date   WBC 8.0 12/06/2014   HGB 8.9* 12/06/2014   HCT 27.5* 12/06/2014   MCV 113.4* 12/06/2014   PLT 506* 12/06/2014   NEUTROABS 3.1 12/06/2014   08/23/2014-ferritin 56 12/06/2014-ferritin 55  Medications: I have reviewed the patient's current medications.  Assessment/Plan: 1. 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 2. Secondary iron overload: Maintained on Exjade. Exjade was resumed 10/14/2009. The ferritin remains adequate.  3. Hospitalization 05/06 through 10/06/2009 with acute renal failure, likely secondary to dehydration. She was also noted to have an elevated ammonia level. The ammonia level normalized during the hospitalization.  4. Hospitalization 03/18 through 08/16/2009 with pneumonia. 5. History of atrial fibrillation/flutter: Status post a Maze procedure. 6. History of mitral regurgitation: Status post mitral valve repair by Dr. [name] in February 2010. 7. History of a pericardial effusion. 8. Chronic bone  pain: Controlled with tramadol. 9. 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 10. Exertional dyspnea: Likely related to multiple factors including cardiac disease, diaphragmatic paralysis, anemia, asthma and pulmonary hypertension. She is followed by Dr. Gwenette Greet and Dr. Haroldine Laws 11. Pneumococcal vaccine-she had a local reaction to a pneumococcal vaccine in the remote past. She tolerated the pneumococcal vaccine in December of 2007 without apparent toxicity. She received a pneumococcal vaccine on 04/08/2012. She received a 13 valent pneumococcal vaccine 03/26/2014 12. Admission 09/08/2013 with an upper respiratory infection and heart failure with rapid atrial fibrillation, status post a cardioversion 09/11/2013     Disposition:  The ferritin remains adequately controlled with Exjade. She will continue every other day Exjade. [name] will return for an office visit and ferritin in 4 months. She will seek medical attention for a fever or symptoms of pneumonia.  [name], MD  12/06/2014  3:07 PM

## 2014-12-06 NOTE — Telephone Encounter (Signed)
Pt confirmed labs/ov per 07/11 POF, gave pt AVS and Calendar.... KJ °

## 2014-12-06 NOTE — Progress Notes (Deleted)
  [place] OFFICE PROGRESS NOTE   Diagnosis:  Pyruvate kinase deficiency, secondary iron overload  INTERVAL HISTORY:   [name] returns as scheduled.  Objective:  Vital signs in last 24 hours:  Blood pressure 135/63, pulse 65, temperature 100.3 F (37.9 C), temperature source Oral, resp. rate 20, height 5\' 2"  (1.575 m), weight 158 lb 6.4 oz (71.85 kg), SpO2 92 %.    HEENT: *** Lymphatics: *** Resp: *** Cardio: *** GI: *** Vascular: *** Neuro: ***  Skin: ***    Lab Results:  Lab Results  Component Value Date   WBC 8.0 12/06/2014   HGB 8.9* 12/06/2014   HCT 27.5* 12/06/2014   MCV 113.4* 12/06/2014   PLT 506* 12/06/2014   NEUTROABS 3.1 12/06/2014    Imaging:  No results found.  Medications: I have reviewed the patient's current medications.  Assessment/Plan: 1. 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 2. Secondary iron overload: Maintained on Exjade. Exjade was resumed 10/14/2009. The ferritin remains adequate.  3. Hospitalization 05/06 through 10/06/2009 with acute renal failure, likely secondary to dehydration. She was also noted to have an elevated ammonia level. The ammonia level normalized during the hospitalization.  4. Hospitalization 03/18 through 08/16/2009 with pneumonia. 5. History of atrial fibrillation/flutter: Status post a Maze procedure. 6. History of mitral regurgitation: Status post mitral valve repair by Dr. [name] in February 2010. 7. History of a pericardial effusion. 8. Chronic bone pain: Controlled with tramadol. 9. 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 10. Exertional dyspnea: Likely related to multiple factors including cardiac disease, diaphragmatic paralysis, anemia, asthma and pulmonary hypertension. She is followed by Dr. Gwenette Greet and Dr. Haroldine Laws 11. Pneumococcal vaccine-she had a local reaction to a pneumococcal vaccine in the remote past. She tolerated  the pneumococcal vaccine in December of 2007 without apparent toxicity. She received a pneumococcal vaccine on 04/08/2012. She received a 13 valent pneumococcal vaccine 03/26/2014 12. Admission 09/08/2013 with an upper respiratory infection and heart failure with rapid atrial fibrillation, status post a cardioversion 09/11/2013    Disposition:    Ned Card ANP/GNP-BC   12/06/2014  2:53 PM

## 2014-12-07 ENCOUNTER — Telehealth: Payer: Self-pay | Admitting: *Deleted

## 2014-12-07 NOTE — Telephone Encounter (Signed)
-----   Message from Ladell Pier, MD sent at 12/06/2014  8:35 PM EDT ----- Please call patient, ferritin is ok, continue exjade, ask care management if there is a company program for the [place]

## 2014-12-08 ENCOUNTER — Encounter (HOSPITAL_COMMUNITY)
Admission: RE | Admit: 2014-12-08 | Discharge: 2014-12-08 | Disposition: A | Discharge status: Home / Self Care | Payer: Self-pay | Source: Ambulatory Visit | Attending: Critical Care Medicine | Admitting: Critical Care Medicine

## 2014-12-09 ENCOUNTER — Encounter: Payer: Self-pay | Admitting: Oncology

## 2014-12-09 NOTE — Progress Notes (Signed)
I called the patient again and gave her ph for patient asst [phone]. She called [employer] and no funds were available for asst with Exjade.

## 2014-12-10 ENCOUNTER — Encounter (HOSPITAL_COMMUNITY): Payer: Self-pay

## 2014-12-10 NOTE — Progress Notes (Signed)
Our pharmacist, Adam has samples for pt when she runs out of Exjade. She has several months supply for now. Unable to reach pt by phone.

## 2014-12-13 ENCOUNTER — Encounter (HOSPITAL_COMMUNITY): Payer: Self-pay

## 2014-12-14 ENCOUNTER — Encounter (HOSPITAL_COMMUNITY): Payer: Medicare Other

## 2014-12-15 ENCOUNTER — Encounter (HOSPITAL_COMMUNITY): Payer: Self-pay

## 2014-12-17 ENCOUNTER — Encounter (HOSPITAL_COMMUNITY): Payer: Self-pay

## 2014-12-20 ENCOUNTER — Encounter (HOSPITAL_COMMUNITY)
Admission: RE | Admit: 2014-12-20 | Discharge: 2014-12-20 | Disposition: A | Discharge status: Home / Self Care | Payer: Self-pay | Source: Ambulatory Visit | Attending: Critical Care Medicine | Admitting: Critical Care Medicine

## 2014-12-20 ENCOUNTER — Telehealth: Payer: Self-pay | Admitting: Emergency Medicine

## 2014-12-20 MED ORDER — FLUTICASONE FUROATE-VILANTEROL 100-25 MCG/INH IN AEPB
INHALATION_SPRAY | RESPIRATORY_TRACT | Status: DC
Start: 2014-12-20 — End: 2015-08-17

## 2014-12-20 NOTE — Telephone Encounter (Signed)
Breo 100 / 25 mcg once a day

## 2014-12-20 NOTE — Telephone Encounter (Signed)
Patient Instructions       Please take levaquin 500mg  daily for 7 days.   Try using OTC decongestants that phenylephrine or dextromethorphan   You may also continue mucinex Please continue your Symbicort. We will need to change this to an alternative in the future, probably Breo.  Follow with Dr Lamonte Sakai in 1 month  --  Pt has appt on Friday with RB. She has 1 puff of symbicort left and is now needing breo called in. She was on vacation and forgot to call. Please advise RB which dosage.  Please advise thanks

## 2014-12-20 NOTE — Telephone Encounter (Signed)
Called pt and aware RX sent in. Nothing further needed 

## 2014-12-21 ENCOUNTER — Encounter (HOSPITAL_COMMUNITY): Payer: Self-pay

## 2014-12-21 ENCOUNTER — Ambulatory Visit (HOSPITAL_COMMUNITY)
Admission: RE | Admit: 2014-12-21 | Discharge: 2014-12-21 | Disposition: A | Discharge status: Home / Self Care | Payer: Medicare Other | Source: Ambulatory Visit | Attending: Cardiology | Admitting: Cardiology

## 2014-12-21 VITALS — BP 107/71 | HR 80 | Resp 18 | Wt 159.5 lb

## 2014-12-21 DIAGNOSIS — G4733 Obstructive sleep apnea (adult) (pediatric): Secondary | ICD-10-CM | POA: Diagnosis not present

## 2014-12-21 DIAGNOSIS — Z79899 Other long term (current) drug therapy: Secondary | ICD-10-CM | POA: Diagnosis not present

## 2014-12-21 DIAGNOSIS — D552 Anemia due to disorders of glycolytic enzymes: Secondary | ICD-10-CM | POA: Diagnosis not present

## 2014-12-21 DIAGNOSIS — I270 Primary pulmonary hypertension: Secondary | ICD-10-CM | POA: Diagnosis not present

## 2014-12-21 DIAGNOSIS — Z9981 Dependence on supplemental oxygen: Secondary | ICD-10-CM | POA: Diagnosis not present

## 2014-12-21 DIAGNOSIS — I272 Other secondary pulmonary hypertension: Secondary | ICD-10-CM | POA: Diagnosis not present

## 2014-12-21 DIAGNOSIS — K219 Gastro-esophageal reflux disease without esophagitis: Secondary | ICD-10-CM | POA: Insufficient documentation

## 2014-12-21 DIAGNOSIS — I5032 Chronic diastolic (congestive) heart failure: Secondary | ICD-10-CM | POA: Insufficient documentation

## 2014-12-21 DIAGNOSIS — Z7902 Long term (current) use of antithrombotics/antiplatelets: Secondary | ICD-10-CM | POA: Insufficient documentation

## 2014-12-21 DIAGNOSIS — M810 Age-related osteoporosis without current pathological fracture: Secondary | ICD-10-CM | POA: Insufficient documentation

## 2014-12-21 DIAGNOSIS — I480 Paroxysmal atrial fibrillation: Secondary | ICD-10-CM | POA: Insufficient documentation

## 2014-12-21 DIAGNOSIS — I10 Essential (primary) hypertension: Secondary | ICD-10-CM | POA: Diagnosis not present

## 2014-12-21 DIAGNOSIS — J45909 Unspecified asthma, uncomplicated: Secondary | ICD-10-CM | POA: Diagnosis not present

## 2014-12-21 LAB — BASIC METABOLIC PANEL
ANION GAP: 8 (ref 5–15)
BUN: 16 mg/dL (ref 6–20)
CHLORIDE: 97 mmol/L — AB (ref 101–111)
CO2: 30 mmol/L (ref 22–32)
CREATININE: 0.99 mg/dL (ref 0.44–1.00)
Calcium: 9.1 mg/dL (ref 8.9–10.3)
GFR, EST NON AFRICAN AMERICAN: 58 mL/min — AB (ref >60)
GLUCOSE: 97 mg/dL (ref 65–99)
Potassium: 3.5 mmol/L (ref 3.5–5.1)
Sodium: 135 mmol/L (ref 135–145)

## 2014-12-21 LAB — BRAIN NATRIURETIC PEPTIDE: B NATRIURETIC PEPTIDE 5: 253.6 pg/mL — AB (ref 0.0–100.0)

## 2014-12-21 NOTE — Progress Notes (Signed)
Patient ID: Clementeen Graham, female   DOB: [birth year], 64 y.o.   MRN: [id number] PCP: Dr Inda Merlin   HPI: Ms. Deas is a 64 y/o woman with h/o HTN, pyruvate kinase deficiency with hemolytic anemia, obesity, asthma, OSA (on CPAP), mitral valve prolapse s/p mini MV repair/Maze (2011 with Dr. [name]), PAF s/p RFA followed by surgical Maze 3474 and diastolic dysfunction.  Pre-op cath with normal coronaries in 2011. 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   Baseline hgb 7-9. Doesn't get transfused unless 6 or below.  At a prior appointment, she reported increased dyspnea.  I did a right heart cath in 6/16, see below.  Filling pressures were elevated, and I replaced Lasix with torsemide.  She feels like she is doing better on torsemide.  Continues on CPAP and continuous oxygen.  She can walk for 10 minutes on a track before getting short of breath.   She has chronic 2 pillow orthopnea.  No chest pain, no lightheadedness.  No tachypalpitations.  She continues Eliquis with no BRBPR or melena.   [place] 06/24/12 RA = 10  RV = 60/7/12  PA = 58/25 (40)  PCW = 19 (no significant v-waves)  Fick cardiac output/index = 6.6/3.7  PVR = 3.1 [place]  FA sat = 91%  PA sat = 66%, 67% PVR was not significantly elevated so selective pulmonary vasodilators were noted started.   RHC 6/16 RA = 17 PA = 74/31 (46) PCWP = 27 CI 3.89 PVR 2.8 WU  Echo 12/13 EF 60-65% mild LVH. Grade 2 DD. MVR stable (no MS noted). Moderate LAE. RV mildly dilated. Mod-severe TR with estimated RVSP 80-90 range (in 2011 was 40-45 mm HG) ECHO 10/22/12 EF 55% RV mildly decreased + Septal bounce. Moderate TR. RVSP 65-70. +diastolic dysfunction (likely restrictive - but tissue Doppler not performed).  ECHO 05/17/2014: EF 50-55%  TEE (4/15): EF 60-65%, severe bilateral atrial enlargement, s/p MV ring with no MR and mild mitral stenosis with meean gradient 6, MVA 1.7 cm^2.  PASP on TTE in 4/15 was 60 mmHg.  Echo (6/16): EF  50-55%, moderate diastolic dysfunction, D-shaped interventricular septum, moderately dilated RV with mildly decreased systolic function, PA systolic pressure 60 mmHg, IVC not dilated, s/p MV repair with mild MS and trivial MR.   Labs (7/14): Potassium 4.6 BUN 16 Creatinine 0.68 hemoglobin 9, LDL 63 Labs (4/15): K 4.3, creatinine 0.9, HCT 26.5 Labs 09/21/13:  K 3.5  Labs 10/21/13 K 4.1 Creatinine 0.76 Labs 7/15: Hgb 9.1 Labs (6/16): K 3.9, creatinine 0.88 => 0.95, HCT 29.1, plts 512, BNP 811  ROS: All systems negative except as listed in HPI, PMH and Problem List.  Past Medical History  Diagnosis Date  . Pyruvate kinase deficiency hemolytic anemia   . Asthma   . Syncope and collapse 2012    r/t anemia  . Anemia   . Dyspnea   . Mitral regurgitation     s/p MV repair complicated by diaphragmatic paralysis  . Fatigue   . Myoclonus   . Heart murmur     severe MR s/p MV repair  . GERD (gastroesophageal reflux disease)   . Allergic rhinitis   . Depression   . Gastritis     NSAID induced  . Asthma   . Hx of asplenia     surgical  . Osteoporosis   . Trochanteric bursitis     left hip  . Pulmonary HTN     mild to moderate  by [place] 05/2012  . Exercise hypoxemia     supplemental O2 PRN  . Hypertension   . Atrial fibrillation     s/p RFA and Maze procedure - resolved  . Atrial tachycardia     Current Outpatient Prescriptions  Medication Sig Dispense Refill  . acetaminophen (TYLENOL) 500 MG tablet Take 1,000 mg by mouth every 6 (six) hours as needed for mild pain or fever.    . bisoprolol (ZEBETA) 5 MG tablet Take 1 tablet (5 mg total) by mouth 2 (two) times daily. 180 tablet 3  . calcium carbonate (OS-CAL) 600 MG TABS Take 600 mg by mouth 2 (two) times daily with a meal.    . CHLORPHENIRAMINE MALEATE PO Take 4 mg by mouth at bedtime.    . cholecalciferol (VITAMIN D) 1000 UNITS tablet Take 1,000 Units by mouth daily.    . clonazePAM (KLONOPIN) 1 MG tablet Take 0.5 mg by mouth at  bedtime as needed for anxiety.     Marland Kitchen deferasirox (EXJADE) 500 MG disintegrating tablet Take 3 tablets (1,500 mg total) by mouth daily with supper. Take 3 tablets once daily 30 minutes prior to largest meal 90 tablet 6  . diltiazem (CARTIA XT) 240 MG 24 hr capsule Take 1 capsule (240 mg total) by mouth daily. 30 capsule 3  . ELIQUIS 5 MG TABS tablet TAKE 1 TABLET BY MOUTH TWICE DAILY 60 tablet 3  . EPINEPHrine (EPI-PEN) 0.3 mg/0.3 mL DEVI Inject 0.3 mg into the muscle once as needed.    Marland Kitchen escitalopram (LEXAPRO) 10 MG tablet Take 5 mg by mouth daily.     . fexofenadine (ALLEGRA) 180 MG tablet Take 180 mg by mouth daily.     . fluticasone (FLONASE) 50 MCG/ACT nasal spray Place 1 spray into both nostrils daily.    . Fluticasone Furoate-Vilanterol 100-25 MCG/INH AEPB 1 puff once daily 60 each 6  . folic acid (FOLVITE) 1 MG tablet Take 1 mg by mouth daily.    Marland Kitchen guaiFENesin (MUCINEX) 600 MG 12 hr tablet Take 600 mg by mouth 2 (two) times daily as needed for to loosen phlegm.     Marland Kitchen ipratropium (ATROVENT HFA) 17 MCG/ACT inhaler Inhale 2 puffs into the lungs every 6 (six) hours.    . Multiple Vitamins-Minerals (AIRBORNE) CHEW Chew 1 tablet by mouth 2 (two) times daily.     . NON FORMULARY Inhale 2 L into the lungs continuous. Oxygen at 2 l/min [place]    . pantoprazole (PROTONIX) 40 MG tablet Take 40 mg by mouth daily.    . potassium chloride SA (KLOR-CON M20) 20 MEQ tablet Take 3 tablets (60 mEq total) by mouth 2 (two) times daily. 180 tablet 6  . Probiotic Product (ALIGN) 4 MG CAPS Take 1 tablet by mouth daily.    . raloxifene (EVISTA) 60 MG tablet Take 60 mg by mouth daily.    . sodium chloride (OCEAN) 0.65 % nasal spray Place 2 sprays into both nostrils 2 (two) times daily.     Marland Kitchen spironolactone (ALDACTONE) 25 MG tablet Take 0.5 tablets (12.5 mg total) by mouth daily. 45 tablet 3  . torsemide (DEMADEX) 20 MG tablet Take 4 tablets (80 mg total) by mouth daily. 120 tablet 6  . traMADol (ULTRAM) 50 MG tablet  Take 50 mg by mouth. 3-4 times daily    . UNABLE TO FIND Med Name: CPAP     No current facility-administered medications for this encounter.   History  Substance Use Topics  .  Smoking status: Never Smoker   . Smokeless tobacco: Never Used  . Alcohol Use: Yes     Comment: socially glass red wine monthly   Family History  Problem Relation Age of Onset  . Breast cancer Mother 76    ER+  . Melanoma Father 68  . Adrenal disorder Sister     pheochromacytoma - rt. adrenal gland  . COPD Maternal Aunt   . Stomach cancer Paternal Uncle     onset in 74s  . Lung cancer Maternal Grandmother     died late 12s, heavy smoker  . Heart disease Maternal Grandfather     hardening of the arteries, poor circulation  . Ovarian cancer Sister 29    stage IV, now 69    Filed Vitals:   12/21/14 1040  BP: 107/71  Pulse: 80  Resp: 18  Weight: 159 lb 8 oz (72.349 kg)  SpO2: 92%     PHYSICAL EXAM: General: NAD wearing O2 HEENT: normal Neck: supple. JVP not elevated.  Carotids 2+ bilaterally; no bruits. No lymphadenopathy or thryomegaly appreciated. Cor: PMI normal. Regular S1S2. No rubs, gallops.  1/6 HSM LLSB.  Loud P2 no RV lift  Lungs: clear 2 liters [place], no wheezing or crackles Abdomen: soft, nontender, nondistended. No hepatosplenomegaly. No bruits or masses. Good bowel sounds. Extremities: no cyanosis, clubbing, rash, edema  Neuro: alert & orientedx3, cranial nerves grossly intact. Moves all 4 extremities w/o difficulty. Affect pleasant.  ASSESSMENT & PLAN:  1. Atrial fibrillation: Paroxysmal. Continue apixaban. No bleeding problems noted.  CBC has been stable. - Continue bisoprolol and diltiazem CD.  2. Pulmonary arterial hypertension: PVR was only 2.8 WU on 6/16 RHC. The PAH appears to be primarily Group 3 in the setting of OSA and chronic respiratory failure (on home oxygen, ?OHS).  Recent echo showed that the RV is moderately dilated with mildly decreased systolic function.  - I do  not think that there is a role for pulmonary vasodilators.  - Continue OSA at night and oxygen during the day.  3. Chronic diastolic CHF: Volume status much improved on torsemide.   - Continue torsemide 80 mg daily.  BMET/BNP today.  4. OHS/OSA: Stable on home oxygen and CPAP. She will be seeing Dr Lamonte Sakai for pulmonary followup.  5. 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  This has been stable.   6. S/p MV repair: Stable by recent echo with mild stenosis and no significant regurgitation.   Loralie Champagne  12/21/2014

## 2014-12-21 NOTE — Patient Instructions (Signed)
LABS today (bmet bnp)  FOLLOW UP in 76months.

## 2014-12-22 ENCOUNTER — Encounter (HOSPITAL_COMMUNITY)
Admission: RE | Admit: 2014-12-22 | Discharge: 2014-12-22 | Disposition: A | Discharge status: Home / Self Care | Payer: Self-pay | Source: Ambulatory Visit | Attending: Critical Care Medicine | Admitting: Critical Care Medicine

## 2014-12-24 ENCOUNTER — Encounter: Payer: Self-pay | Admitting: Emergency Medicine

## 2014-12-24 ENCOUNTER — Encounter (HOSPITAL_COMMUNITY): Payer: Self-pay

## 2014-12-24 ENCOUNTER — Ambulatory Visit (INDEPENDENT_AMBULATORY_CARE_PROVIDER_SITE_OTHER): Payer: Medicare Other | Admitting: Emergency Medicine

## 2014-12-24 VITALS — BP 122/80 | HR 81 | Wt 160.0 lb

## 2014-12-24 DIAGNOSIS — J4520 Mild intermittent asthma, uncomplicated: Secondary | ICD-10-CM | POA: Diagnosis not present

## 2014-12-24 DIAGNOSIS — J309 Allergic rhinitis, unspecified: Secondary | ICD-10-CM

## 2014-12-24 NOTE — Assessment & Plan Note (Signed)
Change symbicort to breo once a day as planned. I'm concerned that Breo may  cause throat irritation. If so then it may not be a good choice for her Continue oxygen Follow with Dr Lamonte Sakai in 3 months or sooner if you have any problems.

## 2014-12-24 NOTE — Progress Notes (Signed)
Subjective:    Patient ID: [name], female    DOB: June 15, 1950, 64 y.o.   MRN: [id number]  Asthma There is no cough, shortness of breath or wheezing. Associated symptoms include postnasal drip. Pertinent negatives include no ear pain, fever, headaches, rhinorrhea, sneezing, sore throat or trouble swallowing. Her past medical history is significant for asthma.   The patient comes in today for follow-up of her known asthma. She also has chronic respiratory failure secondary to diastolic heart failure, pulmonary hypertension, and a paralyzed hemidiaphragm. She has maintained on her bronchodilator regimen, and has not had a recent acute exacerbation. She has had increasing oxygen needs, but has monitor her fluid balance very carefully. She also tells me that her insurance will no longer cover Symbicort.  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 Reports that she just had her diuretics increased recently by [name].  She presents today complaining of nasal and sinus drainage, then sore throat, cough. Throat fullness and globus sensation. Developed some fever. She has remained on Symbicort, is about to run out. She is taking allegra, flonase, protonix. She added mucinex. Her cough has become productive, green to brown. No increase in wheeze or SABA use.   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 She was seen a month ago with cough and rhinitis and a suspected acute bronchitis. We treated her with levofloxacin. Her maintenance bronchodilators Symbicort, but she is about to change to [facility] for insurance reasons. She is better, no sputum. She has developed some cough last few days. She is using [place], fluticasone,  allegra. She uses chlorpheniramine qhs. Her exertional tolerance is back to baseline.    Review of Systems  Constitutional: Negative for fever and unexpected weight change.  HENT: Positive for congestion and postnasal drip. Negative for dental problem, ear pain, nosebleeds, rhinorrhea, sinus pressure, sneezing, sore throat and trouble swallowing.   Eyes: Negative for redness and itching.  Respiratory: Negative for cough, chest tightness, shortness of breath and wheezing.   Cardiovascular: Negative for palpitations and leg swelling.  Gastrointestinal: Negative for nausea and vomiting.  Genitourinary: Negative for dysuria.  Musculoskeletal: Negative for joint swelling.  Skin: Negative for rash.  Neurological: Negative for headaches.  Hematological: Does not bruise/bleed easily.  Psychiatric/Behavioral: Negative for dysphoric mood. The patient is not nervous/anxious.        Objective:   Physical Exam  Filed Vitals:   12/24/14 1051  BP: 122/80  Pulse: 81  Weight: 160 lb (72.576 kg)  SpO2: 97%  Gen: Pleasant, well-nourished, in no distress,  normal affect  ENT: No lesions,  mouth clear,  oropharynx clear, some postnasal drip, hoarse voice  Neck: No JVD, no TMG, no carotid bruits  Lungs: No use of accessory muscles, no dullness to percussion, clear without rales or rhonchi  Cardiovascular: RRR, 2/6 M  Musculoskeletal: No deformities, no cyanosis or clubbing  Neuro: alert, non focal  Skin: Warm, no lesions or rashes      Assessment & Plan:  Allergic rhinitis  You may try increasing her fluticasone nasal spray to 2 sprays each nostril twice a day Continue your saline nasal washes Continue Allegra You may try using your chlorpheniramine (chlor tabs) up to every 6 hours temporarily to help with drainage  Asthma, intrinsic  Change symbicort to breo once a day as planned. I'm concerned that Breo may  cause throat irritation. If so then it may not be a good choice for  her Continue oxygen Follow with [name] in 3 months or sooner if you have any problems.

## 2014-12-24 NOTE — Patient Instructions (Signed)
Change symbicort to breo once a day as planned.  You may try increasing her fluticasone nasal spray to 2 sprays each nostril twice a day Continue your saline nasal washes Continue Allegra You may try using your chlorpheniramine (chlor tabs) up to every 6 hours temporarily to help with drainage Continue oxygen Follow with Dr Lamonte Sakai in 3 months or sooner if you have any problems.

## 2014-12-24 NOTE — Assessment & Plan Note (Signed)
  You may try increasing her fluticasone nasal spray to 2 sprays each nostril twice a day Continue your saline nasal washes Continue Allegra You may try using your chlorpheniramine (chlor tabs) up to every 6 hours temporarily to help with drainage

## 2014-12-27 ENCOUNTER — Encounter (HOSPITAL_COMMUNITY): Payer: Medicare Other

## 2014-12-27 DIAGNOSIS — I272 Other secondary pulmonary hypertension: Secondary | ICD-10-CM | POA: Insufficient documentation

## 2014-12-29 ENCOUNTER — Encounter (HOSPITAL_COMMUNITY): Payer: Self-pay

## 2014-12-30 ENCOUNTER — Telehealth: Payer: Self-pay | Admitting: Emergency Medicine

## 2014-12-30 MED ORDER — AZITHROMYCIN 250 MG PO TABS: ORAL_TABLET | ORAL | Status: DC

## 2014-12-30 NOTE — Telephone Encounter (Signed)
Called spoke with pt. Aware of recs. RX sent in. Nothing further needed 

## 2014-12-30 NOTE — Telephone Encounter (Signed)
Per 12/24/14 OV w/ Dr. Lamonte Sakai: Patient Instructions       Change symbicort to breo once a day as planned.   You may try increasing her fluticasone nasal spray to 2 sprays each nostril twice a day Continue your saline nasal washes Continue Allegra You may try using your chlorpheniramine (chlor tabs) up to every 6 hours temporarily to help with drainage Continue oxygen Follow with Dr Lamonte Sakai in 3 months or sooner if you have any problems.  --  Spoke with pt. She reports this AM she coughed up green phlem but then throughout the day it becomes clear. C/o chest cong, PND, nasal cong, throat hurts and is dry, body aches. No f/c/s/n/v.  Pt is rinsing mouth out after using inhalers. She did increase flonase. RB on vacation this week. Will forward to Dr. Annamaria Boots. Please advise thanks  Allergies  Allergen Reactions  . Iodine Anaphylaxis  . Shellfish Allergy Anaphylaxis  . Iohexol      Code: HIVES, Desc: reaction since childhood, Onset Date: [id number]      Current Outpatient Prescriptions on File Prior to Visit  Medication Sig Dispense Refill  . acetaminophen (TYLENOL) 500 MG tablet Take 1,000 mg by mouth every 6 (six) hours as needed for mild pain or fever.    . bisoprolol (ZEBETA) 5 MG tablet Take 1 tablet (5 mg total) by mouth 2 (two) times daily. 180 tablet 3  . calcium carbonate (OS-CAL) 600 MG TABS Take 600 mg by mouth 2 (two) times daily with a meal.    . CHLORPHENIRAMINE MALEATE PO Take 4 mg by mouth at bedtime.    . cholecalciferol (VITAMIN D) 1000 UNITS tablet Take 1,000 Units by mouth daily.    . clonazePAM (KLONOPIN) 1 MG tablet Take 0.5 mg by mouth at bedtime as needed for anxiety.     Marland Kitchen deferasirox (EXJADE) 500 MG disintegrating tablet Take 3 tablets (1,500 mg total) by mouth daily with supper. Take 3 tablets once daily 30 minutes prior to largest meal 90 tablet 6  . diltiazem (CARTIA XT) 240 MG 24 hr capsule Take 1 capsule (240 mg total) by mouth daily. 30 capsule 3  . ELIQUIS 5 MG  TABS tablet TAKE 1 TABLET BY MOUTH TWICE DAILY 60 tablet 3  . EPINEPHrine (EPI-PEN) 0.3 mg/0.3 mL DEVI Inject 0.3 mg into the muscle once as needed.    Marland Kitchen escitalopram (LEXAPRO) 10 MG tablet Take 5 mg by mouth daily.     . fexofenadine (ALLEGRA) 180 MG tablet Take 180 mg by mouth daily.     . fluticasone (FLONASE) 50 MCG/ACT nasal spray Place 1 spray into both nostrils daily.    . Fluticasone Furoate-Vilanterol 100-25 MCG/INH AEPB 1 puff once daily 60 each 6  . folic acid (FOLVITE) 1 MG tablet Take 1 mg by mouth daily.    Marland Kitchen guaiFENesin (MUCINEX) 600 MG 12 hr tablet Take 600 mg by mouth 2 (two) times daily as needed for to loosen phlegm.     Marland Kitchen ipratropium (ATROVENT HFA) 17 MCG/ACT inhaler Inhale 2 puffs into the lungs every 6 (six) hours.    . Multiple Vitamins-Minerals (AIRBORNE) CHEW Chew 1 tablet by mouth 2 (two) times daily.     . NON FORMULARY Inhale 2 L into the lungs continuous. Oxygen at 2 l/min [place]    . pantoprazole (PROTONIX) 40 MG tablet Take 40 mg by mouth daily.    . potassium chloride SA (KLOR-CON M20) 20 MEQ tablet Take 3 tablets (60 mEq total)  by mouth 2 (two) times daily. 180 tablet 6  . Probiotic Product (ALIGN) 4 MG CAPS Take 1 tablet by mouth daily.    . raloxifene (EVISTA) 60 MG tablet Take 60 mg by mouth daily.    . sodium chloride (OCEAN) 0.65 % nasal spray Place 2 sprays into both nostrils 2 (two) times daily.     Marland Kitchen spironolactone (ALDACTONE) 25 MG tablet Take 0.5 tablets (12.5 mg total) by mouth daily. 45 tablet 3  . torsemide (DEMADEX) 20 MG tablet Take 4 tablets (80 mg total) by mouth daily. 120 tablet 6  . traMADol (ULTRAM) 50 MG tablet Take 50 mg by mouth. 3-4 times daily    . UNABLE TO FIND Med Name: CPAP     No current facility-administered medications on file prior to visit.

## 2014-12-30 NOTE — Telephone Encounter (Signed)
Treat green sputum as a bronchitis- Offer Z pak

## 2014-12-31 ENCOUNTER — Encounter (HOSPITAL_COMMUNITY): Payer: Self-pay

## 2015-01-03 ENCOUNTER — Encounter (HOSPITAL_COMMUNITY): Payer: Self-pay

## 2015-01-05 ENCOUNTER — Encounter (HOSPITAL_COMMUNITY): Payer: Self-pay

## 2015-01-06 ENCOUNTER — Other Ambulatory Visit (HOSPITAL_COMMUNITY): Payer: Self-pay | Admitting: Internal Medicine

## 2015-01-07 ENCOUNTER — Encounter (HOSPITAL_COMMUNITY): Payer: Self-pay

## 2015-01-10 ENCOUNTER — Encounter (HOSPITAL_COMMUNITY)
Admission: RE | Admit: 2015-01-10 | Discharge: 2015-01-10 | Disposition: A | Discharge status: Home / Self Care | Payer: Self-pay | Source: Ambulatory Visit | Attending: Critical Care Medicine | Admitting: Critical Care Medicine

## 2015-01-10 NOTE — Progress Notes (Signed)
PT. HAS BEEN ON VACATION AND IS RETURNING CALL TODAY. INFORM PT. THAT OUR PHARMACIST,ADAM, HAS SAMPLES WHEN SHE RUNS OUT OF HER EXJADE. PT. HAS ENOUGH EXJADE UNTIL FIRST OF November.

## 2015-01-12 ENCOUNTER — Encounter (HOSPITAL_COMMUNITY)
Admission: RE | Admit: 2015-01-12 | Discharge: 2015-01-12 | Disposition: A | Discharge status: Home / Self Care | Payer: Self-pay | Source: Ambulatory Visit | Attending: Critical Care Medicine | Admitting: Critical Care Medicine

## 2015-01-14 ENCOUNTER — Encounter (HOSPITAL_COMMUNITY): Payer: Self-pay

## 2015-01-17 ENCOUNTER — Encounter (HOSPITAL_COMMUNITY): Payer: Self-pay

## 2015-01-19 ENCOUNTER — Encounter (HOSPITAL_COMMUNITY): Payer: Self-pay

## 2015-01-21 ENCOUNTER — Encounter (HOSPITAL_COMMUNITY): Payer: Self-pay

## 2015-01-24 ENCOUNTER — Encounter (HOSPITAL_COMMUNITY): Payer: Self-pay

## 2015-01-26 ENCOUNTER — Encounter (HOSPITAL_COMMUNITY): Payer: Self-pay

## 2015-01-28 ENCOUNTER — Encounter (HOSPITAL_COMMUNITY): Payer: Medicare Other

## 2015-01-28 DIAGNOSIS — I272 Other secondary pulmonary hypertension: Secondary | ICD-10-CM | POA: Insufficient documentation

## 2015-01-31 ENCOUNTER — Encounter (HOSPITAL_COMMUNITY): Payer: Self-pay

## 2015-02-02 ENCOUNTER — Encounter (HOSPITAL_COMMUNITY)
Admission: RE | Admit: 2015-02-02 | Discharge: 2015-02-02 | Disposition: A | Discharge status: Home / Self Care | Payer: Self-pay | Source: Ambulatory Visit | Attending: Critical Care Medicine | Admitting: Critical Care Medicine

## 2015-02-04 ENCOUNTER — Encounter (HOSPITAL_COMMUNITY): Payer: Self-pay

## 2015-02-06 ENCOUNTER — Other Ambulatory Visit (HOSPITAL_COMMUNITY): Payer: Self-pay | Admitting: Internal Medicine

## 2015-02-07 ENCOUNTER — Encounter (HOSPITAL_COMMUNITY)
Admission: RE | Admit: 2015-02-07 | Discharge: 2015-02-07 | Disposition: A | Discharge status: Home / Self Care | Payer: Self-pay | Source: Ambulatory Visit | Attending: Critical Care Medicine | Admitting: Critical Care Medicine

## 2015-02-07 ENCOUNTER — Other Ambulatory Visit (HOSPITAL_COMMUNITY): Payer: Self-pay | Admitting: *Deleted

## 2015-02-07 MED ORDER — APIXABAN 5 MG PO TABS: 5.0000 mg | ORAL_TABLET | Freq: Two times a day (BID) | ORAL | Status: DC

## 2015-02-09 ENCOUNTER — Encounter (HOSPITAL_COMMUNITY)
Admission: RE | Admit: 2015-02-09 | Discharge: 2015-02-09 | Disposition: A | Discharge status: Home / Self Care | Payer: Self-pay | Source: Ambulatory Visit | Attending: Critical Care Medicine | Admitting: Critical Care Medicine

## 2015-02-10 DIAGNOSIS — D3613 Benign neoplasm of peripheral nerves and autonomic nervous system of lower limb, including hip: Secondary | ICD-10-CM | POA: Diagnosis not present

## 2015-02-10 DIAGNOSIS — D2271 Melanocytic nevi of right lower limb, including hip: Secondary | ICD-10-CM | POA: Diagnosis not present

## 2015-02-10 DIAGNOSIS — L300 Nummular dermatitis: Secondary | ICD-10-CM | POA: Diagnosis not present

## 2015-02-10 DIAGNOSIS — D2262 Melanocytic nevi of left upper limb, including shoulder: Secondary | ICD-10-CM | POA: Diagnosis not present

## 2015-02-10 DIAGNOSIS — L821 Other seborrheic keratosis: Secondary | ICD-10-CM | POA: Diagnosis not present

## 2015-02-10 DIAGNOSIS — D1801 Hemangioma of skin and subcutaneous tissue: Secondary | ICD-10-CM | POA: Diagnosis not present

## 2015-02-11 ENCOUNTER — Encounter (HOSPITAL_COMMUNITY): Payer: Self-pay

## 2015-02-14 ENCOUNTER — Encounter (HOSPITAL_COMMUNITY): Payer: Self-pay

## 2015-02-16 ENCOUNTER — Encounter (HOSPITAL_COMMUNITY): Payer: Self-pay

## 2015-02-18 ENCOUNTER — Encounter (HOSPITAL_COMMUNITY): Payer: Self-pay

## 2015-02-21 ENCOUNTER — Encounter (HOSPITAL_COMMUNITY): Payer: Self-pay

## 2015-02-23 ENCOUNTER — Encounter (HOSPITAL_COMMUNITY): Payer: Self-pay

## 2015-02-25 ENCOUNTER — Encounter (HOSPITAL_COMMUNITY): Payer: Self-pay

## 2015-02-28 ENCOUNTER — Encounter (HOSPITAL_COMMUNITY): Payer: Medicare Other

## 2015-02-28 DIAGNOSIS — I272 Other secondary pulmonary hypertension: Secondary | ICD-10-CM | POA: Insufficient documentation

## 2015-03-01 DIAGNOSIS — I481 Persistent atrial fibrillation: Secondary | ICD-10-CM | POA: Diagnosis not present

## 2015-03-01 DIAGNOSIS — M810 Age-related osteoporosis without current pathological fracture: Secondary | ICD-10-CM | POA: Diagnosis not present

## 2015-03-01 DIAGNOSIS — K219 Gastro-esophageal reflux disease without esophagitis: Secondary | ICD-10-CM | POA: Diagnosis not present

## 2015-03-01 DIAGNOSIS — Z91013 Allergy to seafood: Secondary | ICD-10-CM | POA: Diagnosis not present

## 2015-03-01 DIAGNOSIS — G4761 Periodic limb movement disorder: Secondary | ICD-10-CM | POA: Diagnosis not present

## 2015-03-01 DIAGNOSIS — Z1389 Encounter for screening for other disorder: Secondary | ICD-10-CM | POA: Diagnosis not present

## 2015-03-01 DIAGNOSIS — J45909 Unspecified asthma, uncomplicated: Secondary | ICD-10-CM | POA: Diagnosis not present

## 2015-03-01 DIAGNOSIS — I10 Essential (primary) hypertension: Secondary | ICD-10-CM | POA: Diagnosis not present

## 2015-03-01 DIAGNOSIS — F418 Other specified anxiety disorders: Secondary | ICD-10-CM | POA: Diagnosis not present

## 2015-03-01 DIAGNOSIS — Z23 Encounter for immunization: Secondary | ICD-10-CM | POA: Diagnosis not present

## 2015-03-01 DIAGNOSIS — G4700 Insomnia, unspecified: Secondary | ICD-10-CM | POA: Diagnosis not present

## 2015-03-01 DIAGNOSIS — R0902 Hypoxemia: Secondary | ICD-10-CM | POA: Diagnosis not present

## 2015-03-02 ENCOUNTER — Encounter (HOSPITAL_COMMUNITY): Payer: Self-pay

## 2015-03-04 ENCOUNTER — Encounter (HOSPITAL_COMMUNITY): Payer: Self-pay

## 2015-03-07 ENCOUNTER — Encounter (HOSPITAL_COMMUNITY)
Admission: RE | Admit: 2015-03-07 | Discharge: 2015-03-07 | Disposition: A | Discharge status: Home / Self Care | Payer: Self-pay | Source: Ambulatory Visit | Attending: Critical Care Medicine | Admitting: Critical Care Medicine

## 2015-03-07 ENCOUNTER — Other Ambulatory Visit (HOSPITAL_COMMUNITY): Payer: Self-pay | Admitting: *Deleted

## 2015-03-07 MED ORDER — DILTIAZEM HCL ER COATED BEADS 240 MG PO CP24: 240.0000 mg | ORAL_CAPSULE | Freq: Every day | ORAL | Status: DC

## 2015-03-09 ENCOUNTER — Encounter (HOSPITAL_COMMUNITY): Payer: Self-pay

## 2015-03-11 ENCOUNTER — Encounter (HOSPITAL_COMMUNITY): Payer: Self-pay

## 2015-03-14 ENCOUNTER — Encounter (HOSPITAL_COMMUNITY): Payer: Self-pay

## 2015-03-16 ENCOUNTER — Encounter (HOSPITAL_COMMUNITY): Payer: Self-pay

## 2015-03-18 ENCOUNTER — Encounter (HOSPITAL_COMMUNITY): Payer: Self-pay

## 2015-03-21 ENCOUNTER — Encounter (HOSPITAL_COMMUNITY): Admission: RE | Admit: 2015-03-21 | Payer: Self-pay | Source: Ambulatory Visit

## 2015-03-23 ENCOUNTER — Encounter (HOSPITAL_COMMUNITY)
Admission: RE | Admit: 2015-03-23 | Discharge: 2015-03-23 | Disposition: A | Discharge status: Home / Self Care | Payer: Self-pay | Source: Ambulatory Visit | Attending: Critical Care Medicine | Admitting: Critical Care Medicine

## 2015-03-24 ENCOUNTER — Encounter: Payer: Self-pay | Admitting: Oncology

## 2015-03-24 NOTE — Progress Notes (Signed)
I placed [employer] form for exjade asst on desk of nurse for dr. Benay Spice

## 2015-03-25 ENCOUNTER — Encounter: Payer: Self-pay | Admitting: Oncology

## 2015-03-25 ENCOUNTER — Encounter (HOSPITAL_COMMUNITY)
Admission: RE | Admit: 2015-03-25 | Discharge: 2015-03-25 | Disposition: A | Discharge status: Home / Self Care | Payer: Self-pay | Source: Ambulatory Visit | Attending: Critical Care Medicine | Admitting: Critical Care Medicine

## 2015-03-25 NOTE — Progress Notes (Signed)
I called to let the patient know I faxed [employer] -exjade asst forms and made copy for her to pick up on next visit at front with ms. Wilma.

## 2015-03-28 ENCOUNTER — Encounter (HOSPITAL_COMMUNITY): Payer: Self-pay

## 2015-03-28 ENCOUNTER — Encounter: Payer: Self-pay | Admitting: Oncology

## 2015-03-28 NOTE — Progress Notes (Signed)
I placed script form for exjade on desk of nurse for sherrill

## 2015-03-30 ENCOUNTER — Encounter (HOSPITAL_COMMUNITY)
Admission: RE | Admit: 2015-03-30 | Discharge: 2015-03-30 | Disposition: A | Discharge status: Home / Self Care | Payer: Self-pay | Source: Ambulatory Visit | Attending: Critical Care Medicine | Admitting: Critical Care Medicine

## 2015-03-30 ENCOUNTER — Encounter: Payer: Self-pay | Admitting: Oncology

## 2015-03-30 DIAGNOSIS — I272 Other secondary pulmonary hypertension: Secondary | ICD-10-CM | POA: Insufficient documentation

## 2015-03-30 NOTE — Progress Notes (Signed)
I faxed [employer]   [phone]

## 2015-04-01 ENCOUNTER — Encounter: Payer: Self-pay | Admitting: Oncology

## 2015-04-01 NOTE — Progress Notes (Signed)
I placed form frm [employer] on desk of nurse for dr. Benay Spice

## 2015-04-03 ENCOUNTER — Other Ambulatory Visit (HOSPITAL_COMMUNITY): Payer: Self-pay | Admitting: Internal Medicine

## 2015-04-04 ENCOUNTER — Encounter: Payer: Self-pay | Admitting: Oncology

## 2015-04-04 NOTE — Progress Notes (Signed)
Per [employer]  exjade approved thru 05/27/16 SAM [id number]. I faxed to [employer] [phone] also and sent to medical records

## 2015-04-11 ENCOUNTER — Ambulatory Visit (HOSPITAL_BASED_OUTPATIENT_CLINIC_OR_DEPARTMENT_OTHER): Payer: Medicare Other | Admitting: Oncology

## 2015-04-11 ENCOUNTER — Other Ambulatory Visit (HOSPITAL_BASED_OUTPATIENT_CLINIC_OR_DEPARTMENT_OTHER): Payer: Medicare Other

## 2015-04-11 ENCOUNTER — Telehealth: Payer: Self-pay | Admitting: *Deleted

## 2015-04-11 ENCOUNTER — Telehealth: Payer: Self-pay | Admitting: Oncology

## 2015-04-11 VITALS — BP 131/66 | HR 83 | Temp 98.4°F | Resp 18 | Ht 62.0 in | Wt 160.7 lb

## 2015-04-11 DIAGNOSIS — D559 Anemia due to enzyme disorder, unspecified: Secondary | ICD-10-CM

## 2015-04-11 DIAGNOSIS — D649 Anemia, unspecified: Secondary | ICD-10-CM

## 2015-04-11 DIAGNOSIS — D594 Other nonautoimmune hemolytic anemias: Secondary | ICD-10-CM

## 2015-04-11 LAB — CBC WITH DIFFERENTIAL/PLATELET
BASO%: 0.2 % (ref 0.0–2.0)
BASOS ABS: 0 10*3/uL (ref 0.0–0.1)
EOS ABS: 0.5 10*3/uL (ref 0.0–0.5)
EOS%: 5.7 % (ref 0.0–7.0)
HCT: 29.8 % — ABNORMAL LOW (ref 34.8–46.6)
HGB: 9.3 g/dL — ABNORMAL LOW (ref 11.6–15.9)
LYMPH%: 24 % (ref 14.0–49.7)
MCH: 36.8 pg — AB (ref 25.1–34.0)
MCHC: 31.3 g/dL — ABNORMAL LOW (ref 31.5–36.0)
MCV: 117.3 fL — AB (ref 79.5–101.0)
MONO#: 1.8 10*3/uL — AB (ref 0.1–0.9)
MONO%: 20.6 % — ABNORMAL HIGH (ref 0.0–14.0)
NEUT%: 49.5 % (ref 38.4–76.8)
NEUTROS ABS: 4.4 10*3/uL (ref 1.5–6.5)
PLATELETS: 496 10*3/uL — AB (ref 145–400)
RBC: 2.54 10*6/uL — AB (ref 3.70–5.45)
RDW: 14.4 % (ref 11.2–14.5)
WBC: 8.8 10*3/uL (ref 3.9–10.3)
lymph#: 2.1 10*3/uL (ref 0.9–3.3)

## 2015-04-11 LAB — FERRITIN CHCC: Ferritin: 56 ng/ml (ref 9–269)

## 2015-04-11 NOTE — Telephone Encounter (Signed)
-----   Message from Ladell Pier, MD sent at 04/11/2015  1:43 PM EST ----- Please call patient, ferritin is ok

## 2015-04-11 NOTE — Telephone Encounter (Signed)
Gave and printed appt scheda nd avs for pt for march 2017 °

## 2015-04-11 NOTE — Progress Notes (Signed)
  [facility] OFFICE PROGRESS NOTE   Diagnosis: Pyruvate kinase deficiency  INTERVAL HISTORY:   Ms. Bevens returns as scheduled. She reports feeling stable from a respiratory standpoint. She is followed by cardiology and pulmonary medicine. Her insurance will run out at the end of December. She is applying for assistance with the Exjade via the pharmaceutical company.  Objective:  Vital signs in last 24 hours:  Blood pressure 131/66, pulse 83, temperature 98.4 F (36.9 C), temperature source Oral, resp. rate 18, height 5\' 2"  (1.575 m), weight 160 lb 11.2 oz (72.893 kg), SpO2 95 %.    Resp: Lungs clear bilaterally Cardio: Regular rate and rhythm GI: No hepatomegaly, nontender Vascular: No leg edema   Lab Results:  Lab Results  Component Value Date   WBC 8.8 04/11/2015   HGB 9.3* 04/11/2015   HCT 29.8* 04/11/2015   MCV 117.3* 04/11/2015   PLT 496* 04/11/2015   NEUTROABS 4.4 04/11/2015   Ferritin-56  Medications: I have reviewed the patient's current medications.  Assessment/Plan: 1. 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 2. Secondary iron overload: Maintained on Exjade. Exjade was resumed 10/14/2009. The ferritin remains adequate.  3. Hospitalization 05/06 through 10/06/2009 with acute renal failure, likely secondary to dehydration. She was also noted to have an elevated ammonia level. The ammonia level normalized during the hospitalization.  4. Hospitalization 03/18 through 08/16/2009 with pneumonia. 5. History of atrial fibrillation/flutter: Status post a Maze procedure. 6. History of mitral regurgitation: Status post mitral valve repair by Dr. [name] in February 2010. 7. History of a pericardial effusion. 8. Chronic bone pain: Controlled with tramadol. 9. 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 10. Exertional dyspnea: Likely related to multiple factors including cardiac disease, diaphragmatic  paralysis, anemia, asthma and pulmonary hypertension. She is followed by Dr. Gwenette Greet and Dr. Haroldine Laws 11. Pneumococcal vaccine-she had a local reaction to a pneumococcal vaccine in the remote past. She tolerated the pneumococcal vaccine in December of 2007 without apparent toxicity. She received a pneumococcal vaccine on 04/08/2012. She received a 13 valent pneumococcal vaccine 03/26/2014 12. Admission 09/08/2013 with an upper respiratory infection and heart failure with rapid atrial fibrillation, status post a cardioversion 09/11/2013  Disposition:  She appears stable. She will continue Exjade and return for an office/lab visit in 4 months. She has received an influenza vaccine and is up-to-date on the pneumococcal vaccine.  We will continue an attempt at getting her help with obtaining Exjade.  [name], MD  04/11/2015  2:08 PM

## 2015-04-11 NOTE — Telephone Encounter (Signed)
Called pt with ferritin result. She voiced understanding. 

## 2015-04-14 ENCOUNTER — Ambulatory Visit: Payer: Medicare Other | Admitting: Emergency Medicine

## 2015-04-18 ENCOUNTER — Encounter (HOSPITAL_COMMUNITY)
Admission: RE | Admit: 2015-04-18 | Discharge: 2015-04-18 | Disposition: A | Discharge status: Home / Self Care | Payer: Self-pay | Source: Ambulatory Visit | Attending: Critical Care Medicine | Admitting: Critical Care Medicine

## 2015-04-20 ENCOUNTER — Encounter (HOSPITAL_COMMUNITY)
Admission: RE | Admit: 2015-04-20 | Discharge: 2015-04-20 | Disposition: A | Discharge status: Home / Self Care | Payer: Self-pay | Source: Ambulatory Visit | Attending: Critical Care Medicine | Admitting: Critical Care Medicine

## 2015-04-26 DIAGNOSIS — M816 Localized osteoporosis [Lequesne]: Secondary | ICD-10-CM | POA: Diagnosis not present

## 2015-04-26 DIAGNOSIS — N958 Other specified menopausal and perimenopausal disorders: Secondary | ICD-10-CM | POA: Diagnosis not present

## 2015-04-26 DIAGNOSIS — Z6830 Body mass index (BMI) 30.0-30.9, adult: Secondary | ICD-10-CM | POA: Diagnosis not present

## 2015-04-26 DIAGNOSIS — Z01419 Encounter for gynecological examination (general) (routine) without abnormal findings: Secondary | ICD-10-CM | POA: Diagnosis not present

## 2015-04-26 DIAGNOSIS — Z1231 Encounter for screening mammogram for malignant neoplasm of breast: Secondary | ICD-10-CM | POA: Diagnosis not present

## 2015-04-27 ENCOUNTER — Encounter (HOSPITAL_COMMUNITY)
Admission: RE | Admit: 2015-04-27 | Discharge: 2015-04-27 | Disposition: A | Discharge status: Home / Self Care | Payer: Self-pay | Source: Ambulatory Visit | Attending: Critical Care Medicine | Admitting: Critical Care Medicine

## 2015-04-28 ENCOUNTER — Other Ambulatory Visit (HOSPITAL_COMMUNITY): Payer: Self-pay | Admitting: Cardiology

## 2015-05-04 ENCOUNTER — Other Ambulatory Visit (HOSPITAL_COMMUNITY): Payer: Self-pay | Admitting: Internal Medicine

## 2015-05-04 DIAGNOSIS — J321 Chronic frontal sinusitis: Secondary | ICD-10-CM | POA: Diagnosis not present

## 2015-05-11 ENCOUNTER — Encounter (HOSPITAL_COMMUNITY)
Admission: RE | Admit: 2015-05-11 | Discharge: 2015-05-11 | Disposition: A | Discharge status: Home / Self Care | Payer: Self-pay | Source: Ambulatory Visit | Attending: Critical Care Medicine | Admitting: Critical Care Medicine

## 2015-05-11 DIAGNOSIS — I272 Other secondary pulmonary hypertension: Secondary | ICD-10-CM | POA: Insufficient documentation

## 2015-05-16 ENCOUNTER — Telehealth: Payer: Self-pay | Admitting: Emergency Medicine

## 2015-05-16 ENCOUNTER — Telehealth: Payer: Self-pay | Admitting: *Deleted

## 2015-05-16 MED ORDER — AMOXICILLIN-POT CLAVULANATE 875-125 MG PO TABS: 1.0000 | ORAL_TABLET | Freq: Two times a day (BID) | ORAL | Status: DC

## 2015-05-16 NOTE — Telephone Encounter (Signed)
Called and spoke with pt and informed her of RB rec  Pt voiced understanding of rec and new medication instructions Order sent electronically to pharmacy  Nothing further is needed

## 2015-05-16 NOTE — Telephone Encounter (Signed)
Alternative to levaquin would be Augmentin 875mg  bid x 10 days. Order this, encourage nasal saline washes if she can perform./

## 2015-05-16 NOTE — Telephone Encounter (Signed)
Call from patient reporting she "called pulmonologist due to fever of 103 degrees that started last night.  I also have a cough.  Just finished 7 days levaquin for sinus infection.  Taking ibuprofen and tylenol for temp but only goes down to 102.  Dr. Benay Spice has been adamant about the effect of fever on my blood disorder.  I have nasal drainage and chills.  Return number [phone]."

## 2015-05-16 NOTE — Telephone Encounter (Signed)
Had sinus infection, Dr. Inda Merlin put her on Levaquin.  Has fever today of 103.  Coughing a lot, but not able to get anything out.  Still has a lot of sinus pressure.  Levaquin made her very sick to her stomach, dizzy. Zpak never works.  She said that she has not been able to take anything for the fever, the only tylenol she has is the Tylenol PM and she cannot take Advil due to her heart condition.  [facility] [address]  Allergies  Allergen Reactions  . Iodine Anaphylaxis  . Shellfish Allergy Anaphylaxis  . Iohexol      Code: HIVES, Desc: reaction since childhood, Onset Date: [id number]   . Levaquin [Levofloxacin In D5w] Nausea And Vomiting    dizziness

## 2015-05-17 NOTE — Telephone Encounter (Signed)
Telephone Encounter   SHANTICE SCHEPPS (MR# [id number])      Telephone Encounter Info    Author Note Status Last Update User Last Update Date/Time   Collene Gobble, MD Signed Collene Gobble, MD 05/16/2015 2:10 PM    Telephone Encounter    Expand All Collapse All   Alternative to levaquin would be Augmentin 875mg  bid x 10 days. Order this, encourage nasal saline washes if she can perform./

## 2015-05-19 ENCOUNTER — Telehealth: Payer: Self-pay | Admitting: Emergency Medicine

## 2015-05-19 NOTE — Telephone Encounter (Signed)
Offer Augmentin 875, # 14, 1 twice daily  It may hlep to alsotake a probiotic like Florastor or Align, and to take the antibiotic after meals

## 2015-05-19 NOTE — Telephone Encounter (Signed)
Patient is currently on Augmentin.  She said that Dr. Lamonte Sakai gave her Augmentin on 05/16/15. She is still taking the Augmentin now, but it is not helping.    Allergies  Allergen Reactions  . Iodine Anaphylaxis  . Shellfish Allergy Anaphylaxis  . Iohexol      Code: HIVES, Desc: reaction since childhood, Onset Date: [id number]   . Levaquin [Levofloxacin In D5w] Nausea And Vomiting    dizziness    Current Outpatient Prescriptions on File Prior to Visit  Medication Sig Dispense Refill  . acetaminophen (TYLENOL) 500 MG tablet Take 1,000 mg by mouth every 6 (six) hours as needed for mild pain or fever.    Marland Kitchen amoxicillin-clavulanate (AUGMENTIN) 875-125 MG tablet Take 1 tablet by mouth 2 (two) times daily. 20 tablet 0  . apixaban (ELIQUIS) 5 MG TABS tablet Take 1 tablet (5 mg total) by mouth 2 (two) times daily. 60 tablet 3  . bisoprolol (ZEBETA) 5 MG tablet Take 1 tablet (5 mg total) by mouth 2 (two) times daily. 180 tablet 3  . calcium carbonate (OS-CAL) 600 MG TABS Take 600 mg by mouth 2 (two) times daily with a meal.    . CARTIA XT 240 MG 24 hr capsule TAKE 1 CAPSULE BY MOUTH DAILY 30 capsule 0  . CHLORPHENIRAMINE MALEATE PO Take 4 mg by mouth at bedtime.    . cholecalciferol (VITAMIN D) 1000 UNITS tablet Take 1,000 Units by mouth daily.    . clonazePAM (KLONOPIN) 1 MG tablet Take 1.5 mg by mouth at bedtime as needed for anxiety.     Marland Kitchen deferasirox (EXJADE) 500 MG disintegrating tablet Take 3 tablets (1,500 mg total) by mouth daily with supper. Take 3 tablets once daily 30 minutes prior to largest meal 90 tablet 6  . EPINEPHrine (EPI-PEN) 0.3 mg/0.3 mL DEVI Inject 0.3 mg into the muscle once as needed.    Marland Kitchen escitalopram (LEXAPRO) 10 MG tablet Take 5 mg by mouth daily.     . fexofenadine (ALLEGRA) 180 MG tablet Take 180 mg by mouth daily.     . fluticasone (FLONASE) 50 MCG/ACT nasal spray Place 1 spray into both nostrils daily.    . Fluticasone Furoate-Vilanterol 100-25 MCG/INH AEPB 1 puff once  daily 60 each 6  . folic acid (FOLVITE) 1 MG tablet Take 1 mg by mouth daily.    Marland Kitchen guaiFENesin (MUCINEX) 600 MG 12 hr tablet Take 600 mg by mouth 2 (two) times daily as needed for to loosen phlegm.     . Multiple Vitamins-Minerals (AIRBORNE) CHEW Chew 1 tablet by mouth 2 (two) times daily.     . NON FORMULARY Inhale 2 L into the lungs continuous. Oxygen at 2 l/min Marvin    . pantoprazole (PROTONIX) 40 MG tablet Take 40 mg by mouth daily.    . potassium chloride SA (KLOR-CON M20) 20 MEQ tablet Take 3 tablets (60 mEq total) by mouth 2 (two) times daily. 180 tablet 6  . Probiotic Product (ALIGN) 4 MG CAPS Take 1 tablet by mouth daily.    . raloxifene (EVISTA) 60 MG tablet Take 60 mg by mouth daily.    . sodium chloride (OCEAN) 0.65 % nasal spray Place 2 sprays into both nostrils 2 (two) times daily.     Marland Kitchen spironolactone (ALDACTONE) 25 MG tablet Take 0.5 tablets (12.5 mg total) by mouth daily. 45 tablet 3  . torsemide (DEMADEX) 20 MG tablet TAKE 4 TABLETS(80 MG) BY MOUTH DAILY 360 tablet 6  . traMADol (ULTRAM) 50  MG tablet Take 50 mg by mouth. 3-4 times daily    . UNABLE TO FIND Med Name: CPAP     No current facility-administered medications on file prior to visit.

## 2015-05-19 NOTE — Telephone Encounter (Signed)
Sorry- first note said she is on levaquin. If she had levaquin, then augmentin, we would just be guessing at antibiotics. Suggest she try to see ENT- perhaps we could get her in with the [facility] group or Dr Ernesto Rutherford

## 2015-05-19 NOTE — Telephone Encounter (Signed)
Spoke with pt. She is aware of CY's recommendations. Pt declined the referral to ENT, states that he will call her PCP.

## 2015-05-19 NOTE — Telephone Encounter (Signed)
Patient still not any better after starting antibiotic Monday night.  Was put on strong antibiotic, but still running fever 100-101.  Still coughing a lot, but not able to get anything out.  PND is clear. Patient says that she had 7 days of Levaquin that was given to her by Dr. Inda Merlin for a sinus infection. She said that the Levaquin made her sick and she cannot take that again.  She is drinking a lot of fluids, resting, etc...   Pharmacy: [facility] - [place]/ [address]   Allergies  Allergen Reactions  . Iodine Anaphylaxis  . Shellfish Allergy Anaphylaxis  . Iohexol      Code: HIVES, Desc: reaction since childhood, Onset Date: [id number]   . Levaquin [Levofloxacin In D5w] Nausea And Vomiting    dizziness   To CY in RB's absence.

## 2015-05-31 ENCOUNTER — Other Ambulatory Visit (HOSPITAL_COMMUNITY): Payer: Self-pay | Admitting: Internal Medicine

## 2015-06-01 ENCOUNTER — Telehealth (HOSPITAL_COMMUNITY): Payer: Self-pay | Admitting: *Deleted

## 2015-06-01 ENCOUNTER — Other Ambulatory Visit (HOSPITAL_COMMUNITY): Payer: Self-pay | Admitting: Cardiology

## 2015-06-10 ENCOUNTER — Ambulatory Visit (INDEPENDENT_AMBULATORY_CARE_PROVIDER_SITE_OTHER): Payer: Medicare Other | Admitting: Emergency Medicine

## 2015-06-10 ENCOUNTER — Encounter: Payer: Self-pay | Admitting: Emergency Medicine

## 2015-06-10 VITALS — BP 114/76 | HR 72 | Ht 62.0 in | Wt 163.0 lb

## 2015-06-10 DIAGNOSIS — J309 Allergic rhinitis, unspecified: Secondary | ICD-10-CM

## 2015-06-10 DIAGNOSIS — J4520 Mild intermittent asthma, uncomplicated: Secondary | ICD-10-CM | POA: Diagnosis not present

## 2015-06-10 NOTE — Assessment & Plan Note (Signed)
She has had a difficult few months but is now improved after treatment forsinusitis and what sounds likean exacerbation of her asthma. We will continue her maintenance medications including her Breo nd her allergy regimen.

## 2015-06-10 NOTE — Progress Notes (Signed)
Subjective:    Patient ID: [name], female    DOB: 1950-11-27, 65 y.o.   MRN: [id number]  Asthma There is no cough, shortness of breath or wheezing. Associated symptoms include postnasal drip. Pertinent negatives include no ear pain, fever, headaches, rhinorrhea, sneezing, sore throat or trouble swallowing. Her past medical history is significant for asthma.   The patient comes in today for follow-up of her known asthma. She also has chronic respiratory failure secondary to diastolic heart failure, pulmonary hypertension, and a paralyzed hemidiaphragm. She has maintained on her bronchodilator regimen, and has not had a recent acute exacerbation. She has had increasing oxygen needs, but has monitor her fluid balance very carefully. She also tells me that her insurance will no longer cover Symbicort.  Acute office visit 11/19/14 -- the patient has been followed by Dr. 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 Reports that she just had her diuretics increased recently by Dr Haroldine Laws.  She presents today complaining of nasal and sinus drainage, then sore throat, cough. Throat fullness and globus sensation. Developed some fever. She has remained on Symbicort, is about to run out. She is taking allegra, flonase, protonix. She added mucinex. Her cough has become productive, green to brown. No increase in wheeze or SABA use.   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 She was seen a month ago with cough and rhinitis and a suspected acute bronchitis. We treated her with levofloxacin. Her maintenance bronchodilators Symbicort, but she is about to change to [facility] for insurance reasons. She is better, no sputum. She has developed some cough last few days. She is using [place], fluticasone,  allegra. She uses chlorpheniramine qhs. Her exertional tolerance is back to baseline.   ROV 06/10/15 --  Follow-up visit for asthma, allergic rhinitis. Last visit we tried stopping Symbicort and started Breo. She tells me that the change went well, no decline in breathing, no new side effects. She was well until 2 weeks before Christmas, more nasal gtt. Was treated for an acute sinusitis by Dr Inda Merlin. While she was ill her breathing was worse, more wheeze. Initially improved but then febrile at X-mas, was treated with Augmentin for bronchitis. She ultimately improved with prednisone. Now better except that she has some sinus pressure. She is on her usual allergy regimen - allegra, flonase, chlortab prn. She is feeling better, close to baseline.    Review of Systems  Constitutional: Negative for fever and unexpected weight change.  HENT: Positive for congestion, postnasal drip and sinus pressure. Negative for dental problem, ear pain, nosebleeds, rhinorrhea, sneezing, sore throat and trouble swallowing.   Eyes: Negative for redness and itching.  Respiratory: Negative for cough, chest tightness, shortness of breath and wheezing.   Cardiovascular: Negative for palpitations and leg swelling.  Gastrointestinal: Negative for nausea and vomiting.  Genitourinary: Negative for dysuria.  Musculoskeletal: Negative for joint swelling.  Skin: Negative for rash.  Neurological: Negative for headaches.  Hematological: Does not bruise/bleed easily.  Psychiatric/Behavioral: Negative for dysphoric mood. The patient is not nervous/anxious.        Objective:   Physical Exam  Filed Vitals:   06/10/15 1459 06/10/15 1500  BP:  114/76  Pulse:  72  Height: 5\' 2"  (1.575 m)   Weight: 163 lb (73.936 kg)   SpO2:  94%  Gen: Pleasant, well-nourished, in  no distress,  normal affect  ENT: No lesions,  mouth clear,  oropharynx clear, some postnasal drip, hoarse voice  Neck: No JVD, no TMG, no carotid bruits  Lungs:  No use of accessory muscles, no dullness to percussion, clear without rales or rhonchi  Cardiovascular: RRR, 2/6 M  Musculoskeletal: No deformities, no cyanosis or clubbing  Neuro: alert, non focal  Skin: Warm, no lesions or rashes      Assessment & Plan:  Asthma, intrinsic She has had a difficult few months but is now improved after treatment forsinusitis and what sounds likean exacerbation of her asthma. We will continue her maintenance medications including her Breo nd her allergy regimen.   Allergic rhinitis ontinue fluticasone nasal spray, Allegra, chlorpheniramine when necessary.

## 2015-06-10 NOTE — Assessment & Plan Note (Signed)
ontinue fluticasone nasal spray, Allegra, chlorpheniramine when necessary.

## 2015-06-10 NOTE — Patient Instructions (Addendum)
Please continue Breo daily Use albuterol 2 puffs as needed for shortness of breath.  Continue your allegra, fluticasone nasal spray, chlorpheniramine tabs, nasal saline washes.  Wear your oxygen at all times  Follow with Dr Lamonte Sakai in 4 months or sooner if you have any problems.

## 2015-06-21 DIAGNOSIS — G4733 Obstructive sleep apnea (adult) (pediatric): Secondary | ICD-10-CM | POA: Diagnosis not present

## 2015-06-22 ENCOUNTER — Telehealth: Payer: Self-pay | Admitting: Oncology

## 2015-06-22 ENCOUNTER — Other Ambulatory Visit (HOSPITAL_COMMUNITY): Payer: Self-pay | Admitting: Internal Medicine

## 2015-06-22 NOTE — Telephone Encounter (Signed)
Left message for patient about appointment changes due to the provider being out of the office. Schedule sent by mail.

## 2015-06-28 ENCOUNTER — Other Ambulatory Visit (HOSPITAL_COMMUNITY): Payer: Self-pay | Admitting: Cardiology

## 2015-07-24 ENCOUNTER — Other Ambulatory Visit (HOSPITAL_COMMUNITY): Payer: Self-pay | Admitting: Internal Medicine

## 2015-07-28 ENCOUNTER — Other Ambulatory Visit (HOSPITAL_COMMUNITY): Payer: Self-pay | Admitting: Cardiology

## 2015-08-09 ENCOUNTER — Ambulatory Visit: Payer: Medicare Other | Admitting: Oncology

## 2015-08-09 ENCOUNTER — Other Ambulatory Visit: Payer: Medicare Other

## 2015-08-11 ENCOUNTER — Other Ambulatory Visit (HOSPITAL_COMMUNITY): Payer: Self-pay | Admitting: Cardiology

## 2015-08-16 ENCOUNTER — Ambulatory Visit: Payer: Medicare Other | Admitting: Oncology

## 2015-08-16 ENCOUNTER — Other Ambulatory Visit: Payer: Medicare Other

## 2015-08-16 ENCOUNTER — Telehealth: Payer: Self-pay | Admitting: Oncology

## 2015-08-16 ENCOUNTER — Telehealth: Payer: Self-pay | Admitting: *Deleted

## 2015-08-16 NOTE — Telephone Encounter (Signed)
Call from pt reporting she has a fever today and needs to reschedule office visit. Pt also reports she has decreased her Exjade to twice weekly in order to stretch medication. Insurance has approved med but her co-pay is over $1,000 per month.  MD will discuss possible alternatives at next visit.

## 2015-08-16 NOTE — Telephone Encounter (Signed)
Spoke with patient re lab/BS 3/27 @ 8:45 am.

## 2015-08-17 ENCOUNTER — Other Ambulatory Visit: Payer: Self-pay | Admitting: Emergency Medicine

## 2015-08-17 ENCOUNTER — Telehealth: Payer: Self-pay | Admitting: Pharmacist

## 2015-08-17 NOTE — Telephone Encounter (Signed)
08/17/15: Patient approved for [employer] grant for $10,000 for Exjade for the 2017 year. Called patient and left voicemail that she can come and pick up copay assistance form to take to her pharmacy at the [facility]. The form is on my desk.   Thank you,  Montel Clock, PharmD, [facility]

## 2015-08-22 ENCOUNTER — Ambulatory Visit (HOSPITAL_BASED_OUTPATIENT_CLINIC_OR_DEPARTMENT_OTHER): Payer: Medicare Other | Admitting: Oncology

## 2015-08-22 ENCOUNTER — Telehealth: Payer: Self-pay | Admitting: Oncology

## 2015-08-22 ENCOUNTER — Other Ambulatory Visit: Payer: Self-pay | Admitting: *Deleted

## 2015-08-22 ENCOUNTER — Telehealth: Payer: Self-pay | Admitting: *Deleted

## 2015-08-22 ENCOUNTER — Other Ambulatory Visit (HOSPITAL_BASED_OUTPATIENT_CLINIC_OR_DEPARTMENT_OTHER): Payer: Medicare Other

## 2015-08-22 VITALS — BP 124/51 | HR 81 | Temp 98.3°F | Resp 18 | Ht 62.0 in | Wt 166.3 lb

## 2015-08-22 DIAGNOSIS — D552 Anemia due to disorders of glycolytic enzymes: Secondary | ICD-10-CM

## 2015-08-22 DIAGNOSIS — D594 Other nonautoimmune hemolytic anemias: Secondary | ICD-10-CM

## 2015-08-22 LAB — FERRITIN: FERRITIN: 48 ng/mL (ref 9–269)

## 2015-08-22 LAB — CBC WITH DIFFERENTIAL/PLATELET
BASO%: 1.2 % (ref 0.0–2.0)
BASOS ABS: 0.1 10*3/uL (ref 0.0–0.1)
EOS ABS: 0.7 10*3/uL — AB (ref 0.0–0.5)
EOS%: 6.2 % (ref 0.0–7.0)
HEMATOCRIT: 27.7 % — AB (ref 34.8–46.6)
HGB: 8.9 g/dL — ABNORMAL LOW (ref 11.6–15.9)
LYMPH%: 26.5 % (ref 14.0–49.7)
MCH: 36.8 pg — AB (ref 25.1–34.0)
MCHC: 32 g/dL (ref 31.5–36.0)
MCV: 115.1 fL — ABNORMAL HIGH (ref 79.5–101.0)
MONO#: 2.5 10*3/uL — ABNORMAL HIGH (ref 0.1–0.9)
MONO%: 21.3 % — AB (ref 0.0–14.0)
NEUT#: 5.2 10*3/uL (ref 1.5–6.5)
NEUT%: 44.8 % (ref 38.4–76.8)
Platelets: 441 10*3/uL — ABNORMAL HIGH (ref 145–400)
RBC: 2.4 10*6/uL — ABNORMAL LOW (ref 3.70–5.45)
RDW: 13.8 % (ref 11.2–14.5)
WBC: 11.6 10*3/uL — ABNORMAL HIGH (ref 3.9–10.3)
lymph#: 3.1 10*3/uL (ref 0.9–3.3)

## 2015-08-22 MED ORDER — DEFERASIROX 500 MG PO TBSO
500.0000 mg | ORAL_TABLET | ORAL | Status: DC
Start: 2015-08-22 — End: 2015-12-30

## 2015-08-22 NOTE — Telephone Encounter (Signed)
-----   Message from Ladell Pier, MD sent at 08/22/2015 11:43 AM EDT ----- Please call patient, ferritin is ok, continue exjade, f/u as scheduled

## 2015-08-22 NOTE — Progress Notes (Signed)
  [place] OFFICE PROGRESS NOTE   Diagnosis: Pyruvate kinase deficiency  INTERVAL HISTORY:   [name] returns after missing a scheduled visit last week. She had an upper respiratory infection. Her symptoms have improved. She is now participating in an exercise program at the [facility]. Stable bone pain. She has limited the Exjade use over the past several months secondary to a lack of insurance coverage. She has been taking Exjade 2 days per week for the past few months.  Objective:  Vital signs in last 24 hours:  Blood pressure 124/51, pulse 81, temperature 98.3 F (36.8 C), temperature source Oral, resp. rate 18, height 5\' 2"  (1.575 m), weight 166 lb 4.8 oz (75.433 kg), SpO2 92 %.     Resp: Lungs clear bilaterally Cardio: Regular rate and rhythm GI: No hepatomegaly Vascular: No leg edema   Lab Results:  Lab Results  Component Value Date   WBC 11.6* 08/22/2015   HGB 8.9* 08/22/2015   HCT 27.7* 08/22/2015   MCV 115.1* 08/22/2015   PLT 441 Few Large platelets present* 08/22/2015   NEUTROABS 5.2 08/22/2015   Ferritin on 04/11/2015: 56  Medications: I have reviewed the patient's current medications.  Assessment/Plan: 1. 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 2. Secondary iron overload: Maintained on Exjade. Exjade was resumed 10/14/2009.   3. Hospitalization 05/06 through 10/06/2009 with acute renal failure, likely secondary to dehydration. She was also noted to have an elevated ammonia level. The ammonia level normalized during the hospitalization.  4. Hospitalization 03/18 through 08/16/2009 with pneumonia. 5. History of atrial fibrillation/flutter: Status post a Maze procedure. 6. History of mitral regurgitation: Status post mitral valve repair by Dr. [name] in February 2010. 7. History of a pericardial effusion. 8. Chronic bone pain: Controlled with tramadol. 9. 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 10. Exertional dyspnea: Likely related to multiple factors including cardiac disease, diaphragmatic paralysis, anemia, asthma and pulmonary hypertension. She is followed by Dr. Gwenette Greet and Dr. Haroldine Laws 11. Pneumococcal vaccine-she had a local reaction to a pneumococcal vaccine in the remote past. She tolerated the pneumococcal vaccine in December of 2007 without apparent toxicity. She received a pneumococcal vaccine on 04/08/2012. She received a 13 valent pneumococcal vaccine 03/26/2014 12. Admission 09/08/2013 with an upper respiratory infection and heart failure with rapid atrial fibrillation, status post a cardioversion 09/11/2013    Disposition:  [name] appears stable. The [facility] has obtained funding for her to continue Exjade. We will follow-up on the ferritin level from today. She will return for a lab visit in 4 months and an office visit in 8 months.  [name], MD  08/22/2015  9:52 AM

## 2015-08-22 NOTE — Telephone Encounter (Signed)
Gave and printd appt sched and avs for pt for July and NOV

## 2015-08-22 NOTE — Telephone Encounter (Signed)
Pt notified that Ferritin results are OK and to continue exjade as ordered per Dr. Benay Spice.  Pt has no questions at this time and is appreciative of call.

## 2015-09-11 ENCOUNTER — Other Ambulatory Visit (HOSPITAL_COMMUNITY): Payer: Self-pay | Admitting: Cardiology

## 2015-09-13 ENCOUNTER — Other Ambulatory Visit: Payer: Self-pay | Admitting: *Deleted

## 2015-09-13 DIAGNOSIS — I119 Hypertensive heart disease without heart failure: Secondary | ICD-10-CM | POA: Diagnosis not present

## 2015-09-13 DIAGNOSIS — Z0001 Encounter for general adult medical examination with abnormal findings: Secondary | ICD-10-CM | POA: Diagnosis not present

## 2015-09-13 DIAGNOSIS — I272 Other secondary pulmonary hypertension: Secondary | ICD-10-CM | POA: Diagnosis not present

## 2015-09-13 DIAGNOSIS — D649 Anemia, unspecified: Secondary | ICD-10-CM | POA: Diagnosis not present

## 2015-09-13 DIAGNOSIS — E559 Vitamin D deficiency, unspecified: Secondary | ICD-10-CM | POA: Diagnosis not present

## 2015-09-13 DIAGNOSIS — F418 Other specified anxiety disorders: Secondary | ICD-10-CM | POA: Diagnosis not present

## 2015-09-13 DIAGNOSIS — R0600 Dyspnea, unspecified: Secondary | ICD-10-CM | POA: Diagnosis not present

## 2015-09-13 DIAGNOSIS — R0902 Hypoxemia: Secondary | ICD-10-CM | POA: Diagnosis not present

## 2015-09-13 DIAGNOSIS — G4733 Obstructive sleep apnea (adult) (pediatric): Secondary | ICD-10-CM | POA: Diagnosis not present

## 2015-09-13 DIAGNOSIS — Z79899 Other long term (current) drug therapy: Secondary | ICD-10-CM | POA: Diagnosis not present

## 2015-09-13 DIAGNOSIS — M859 Disorder of bone density and structure, unspecified: Secondary | ICD-10-CM | POA: Diagnosis not present

## 2015-09-13 DIAGNOSIS — M810 Age-related osteoporosis without current pathological fracture: Secondary | ICD-10-CM | POA: Diagnosis not present

## 2015-09-15 MED ORDER — POTASSIUM CHLORIDE CRYS ER 20 MEQ PO TBCR: 60.0000 meq | EXTENDED_RELEASE_TABLET | Freq: Two times a day (BID) | ORAL | Status: DC

## 2015-09-25 ENCOUNTER — Other Ambulatory Visit (HOSPITAL_COMMUNITY): Payer: Self-pay | Admitting: Internal Medicine

## 2015-10-10 ENCOUNTER — Ambulatory Visit: Payer: Medicare Other | Admitting: Emergency Medicine

## 2015-10-19 ENCOUNTER — Other Ambulatory Visit (HOSPITAL_COMMUNITY): Payer: Self-pay | Admitting: Cardiology

## 2015-10-31 ENCOUNTER — Other Ambulatory Visit (HOSPITAL_COMMUNITY): Payer: Self-pay | Admitting: Internal Medicine

## 2015-11-02 NOTE — Assessment & Plan Note (Signed)
Please continue your Symbicort. We will need to change this to an alternative in the future, probably Breo.  Follow with Dr Lamonte Sakai in 1 month

## 2015-11-02 NOTE — Assessment & Plan Note (Signed)
Please take levaquin 500mg  daily for 7 days.  Try using OTC decongestants that phenylephrine or dextromethorphan  You may also continue mucinex

## 2015-11-21 ENCOUNTER — Other Ambulatory Visit (HOSPITAL_COMMUNITY): Payer: Self-pay | Admitting: *Deleted

## 2015-11-21 ENCOUNTER — Other Ambulatory Visit (HOSPITAL_COMMUNITY): Payer: Self-pay | Admitting: Internal Medicine

## 2015-11-21 MED ORDER — TORSEMIDE 20 MG PO TABS: ORAL_TABLET | ORAL | Status: DC

## 2015-11-24 ENCOUNTER — Encounter (HOSPITAL_COMMUNITY): Payer: Self-pay | Admitting: Emergency Medicine

## 2015-11-24 ENCOUNTER — Emergency Department (HOSPITAL_COMMUNITY): Payer: Medicare Other

## 2015-11-24 ENCOUNTER — Emergency Department (HOSPITAL_COMMUNITY)
Admission: EM | Admit: 2015-11-24 | Discharge: 2015-11-24 | Disposition: A | Discharge status: Home / Self Care | Payer: Medicare Other | Attending: Emergency Medicine | Admitting: Emergency Medicine

## 2015-11-24 ENCOUNTER — Emergency Department (HOSPITAL_BASED_OUTPATIENT_CLINIC_OR_DEPARTMENT_OTHER)
Admit: 2015-11-24 | Discharge: 2015-11-24 | Disposition: A | Discharge status: Home / Self Care | Payer: Medicare Other | Attending: Emergency Medicine | Admitting: Emergency Medicine

## 2015-11-24 DIAGNOSIS — M7989 Other specified soft tissue disorders: Secondary | ICD-10-CM

## 2015-11-24 DIAGNOSIS — J45909 Unspecified asthma, uncomplicated: Secondary | ICD-10-CM | POA: Insufficient documentation

## 2015-11-24 DIAGNOSIS — W19XXXA Unspecified fall, initial encounter: Secondary | ICD-10-CM

## 2015-11-24 DIAGNOSIS — Y929 Unspecified place or not applicable: Secondary | ICD-10-CM | POA: Diagnosis not present

## 2015-11-24 DIAGNOSIS — W109XXA Fall (on) (from) unspecified stairs and steps, initial encounter: Secondary | ICD-10-CM | POA: Diagnosis not present

## 2015-11-24 DIAGNOSIS — R05 Cough: Secondary | ICD-10-CM | POA: Diagnosis not present

## 2015-11-24 DIAGNOSIS — M79604 Pain in right leg: Secondary | ICD-10-CM | POA: Diagnosis not present

## 2015-11-24 DIAGNOSIS — S80811A Abrasion, right lower leg, initial encounter: Secondary | ICD-10-CM | POA: Insufficient documentation

## 2015-11-24 DIAGNOSIS — L03115 Cellulitis of right lower limb: Secondary | ICD-10-CM | POA: Diagnosis not present

## 2015-11-24 DIAGNOSIS — S8991XA Unspecified injury of right lower leg, initial encounter: Secondary | ICD-10-CM | POA: Diagnosis present

## 2015-11-24 DIAGNOSIS — Y939 Activity, unspecified: Secondary | ICD-10-CM | POA: Diagnosis not present

## 2015-11-24 DIAGNOSIS — R600 Localized edema: Secondary | ICD-10-CM | POA: Diagnosis not present

## 2015-11-24 DIAGNOSIS — I10 Essential (primary) hypertension: Secondary | ICD-10-CM | POA: Insufficient documentation

## 2015-11-24 DIAGNOSIS — M79609 Pain in unspecified limb: Secondary | ICD-10-CM

## 2015-11-24 DIAGNOSIS — R0602 Shortness of breath: Secondary | ICD-10-CM | POA: Diagnosis not present

## 2015-11-24 DIAGNOSIS — Y999 Unspecified external cause status: Secondary | ICD-10-CM | POA: Insufficient documentation

## 2015-11-24 DIAGNOSIS — Z7901 Long term (current) use of anticoagulants: Secondary | ICD-10-CM | POA: Diagnosis not present

## 2015-11-24 DIAGNOSIS — L0390 Cellulitis, unspecified: Secondary | ICD-10-CM

## 2015-11-24 DIAGNOSIS — Z79899 Other long term (current) drug therapy: Secondary | ICD-10-CM | POA: Diagnosis not present

## 2015-11-24 LAB — I-STAT CHEM 8, ED
BUN: 20 mg/dL (ref 6–20)
CHLORIDE: 95 mmol/L — AB (ref 101–111)
CREATININE: 0.9 mg/dL (ref 0.44–1.00)
Calcium, Ion: 1.12 mmol/L (ref 1.12–1.23)
Glucose, Bld: 129 mg/dL — ABNORMAL HIGH (ref 65–99)
HEMATOCRIT: 30 % — AB (ref 36.0–46.0)
Hemoglobin: 10.2 g/dL — ABNORMAL LOW (ref 12.0–15.0)
Potassium: 3.4 mmol/L — ABNORMAL LOW (ref 3.5–5.1)
SODIUM: 140 mmol/L (ref 135–145)
TCO2: 31 mmol/L (ref 0–100)

## 2015-11-24 LAB — CBC WITH DIFFERENTIAL/PLATELET
Basophils Absolute: 0 10*3/uL (ref 0.0–0.1)
Basophils Relative: 0 %
EOS ABS: 0.1 10*3/uL (ref 0.0–0.7)
Eosinophils Relative: 1 %
HEMATOCRIT: 28.9 % — AB (ref 36.0–46.0)
HEMOGLOBIN: 8.9 g/dL — AB (ref 12.0–15.0)
Lymphocytes Relative: 16 %
Lymphs Abs: 2.3 10*3/uL (ref 0.7–4.0)
MCH: 34.6 pg — AB (ref 26.0–34.0)
MCHC: 30.8 g/dL (ref 30.0–36.0)
MCV: 112.5 fL — ABNORMAL HIGH (ref 78.0–100.0)
Monocytes Absolute: 2.5 10*3/uL — ABNORMAL HIGH (ref 0.1–1.0)
Monocytes Relative: 17 %
Neutro Abs: 9.7 10*3/uL — ABNORMAL HIGH (ref 1.7–7.7)
Neutrophils Relative %: 66 %
Platelets: 486 10*3/uL — ABNORMAL HIGH (ref 150–400)
RBC: 2.57 MIL/uL — AB (ref 3.87–5.11)
RDW: 13.7 % (ref 11.5–15.5)
WBC: 14.6 10*3/uL — ABNORMAL HIGH (ref 4.0–10.5)

## 2015-11-24 MED ORDER — OXYCODONE-ACETAMINOPHEN 5-325 MG PO TABS
1.0000 | ORAL_TABLET | Freq: Once | ORAL | Status: AC
  Administered 2015-11-24: 1 via ORAL
  Filled 2015-11-24: qty 1

## 2015-11-24 MED ORDER — CLINDAMYCIN HCL 300 MG PO CAPS: 300.0000 mg | ORAL_CAPSULE | Freq: Four times a day (QID) | ORAL | Status: DC

## 2015-11-24 MED ORDER — CLINDAMYCIN HCL 150 MG PO CAPS
450.0000 mg | ORAL_CAPSULE | Freq: Once | ORAL | Status: AC
  Administered 2015-11-24: 450 mg via ORAL
  Filled 2015-11-24: qty 3

## 2015-11-24 MED ORDER — OXYCODONE-ACETAMINOPHEN 5-325 MG PO TABS: 1.0000 | ORAL_TABLET | Freq: Three times a day (TID) | ORAL | Status: DC | PRN

## 2015-11-24 NOTE — ED Notes (Signed)
Called to update v/s pt not answering

## 2015-11-24 NOTE — ED Notes (Addendum)
Gave pt Coke Cola and a [place] sandwich, per Resident Physician - Dr. Jarold Song. Informed Chelsea - RN.

## 2015-11-24 NOTE — Discharge Instructions (Signed)
Return to the ER if the symptoms are progressing, you start having high fevers, chills.   Cellulitis Cellulitis is an infection of the skin and the tissue beneath it. The infected area is usually red and tender. Cellulitis occurs most often in the arms and lower legs.  CAUSES  Cellulitis is caused by bacteria that enter the skin through cracks or cuts in the skin. The most common types of bacteria that cause cellulitis are staphylococci and streptococci. SIGNS AND SYMPTOMS   Redness and warmth.  Swelling.  Tenderness or pain.  Fever. DIAGNOSIS  Your health care provider can usually determine what is wrong based on a physical exam. Blood tests may also be done. TREATMENT  Treatment usually involves taking an antibiotic medicine. HOME CARE INSTRUCTIONS   Take your antibiotic medicine as directed by your health care provider. Finish the antibiotic even if you start to feel better.  Keep the infected arm or leg elevated to reduce swelling.  Apply a warm cloth to the affected area up to 4 times per day to relieve pain.  Take medicines only as directed by your health care provider.  Keep all follow-up visits as directed by your health care provider. SEEK MEDICAL CARE IF:   You notice red streaks coming from the infected area.  Your red area gets larger or turns dark in color.  Your bone or joint underneath the infected area becomes painful after the skin has healed.  Your infection returns in the same area or another area.  You notice a swollen bump in the infected area.  You develop new symptoms.  You have a fever. SEEK IMMEDIATE MEDICAL CARE IF:   You feel very sleepy.  You develop vomiting or diarrhea.  You have a general ill feeling (malaise) with muscle aches and pains.   This information is not intended to replace advice given to you by your health care provider. Make sure you discuss any questions you have with your health care provider.   Document Released:  02/21/2005 Document Revised: 02/02/2015 Document Reviewed: 07/30/2011 Elsevier Interactive Patient Education [employer].

## 2015-11-24 NOTE — ED Notes (Signed)
Pt sts right leg pain and swelling after falling down stairs one week ago; pt on home O2 and sts productive cough recently; pt sts sent here for eval for DVT and chest xray

## 2015-11-24 NOTE — Progress Notes (Signed)
*  PRELIMINARY RESULTS* Vascular Ultrasound Right lower extremity venous duplex has been completed.  Preliminary findings: No evidence of DVT or baker's cyst.  Area on right shin with mixed echoes, most likely a hematoma.    Landry Mellow, RDMS, RVT  11/24/2015, 3:01 PM

## 2015-11-24 NOTE — ED Provider Notes (Signed)
CSN: [id number]     Arrival date & time 11/24/15  1423 History   First MD Initiated Contact with Patient 11/24/15 1926     Chief Complaint  Patient presents with  . Leg Swelling   (Consider location/radiation/quality/duration/timing/severity/associated sxs/prior Treatment) HPI Patient presents with right leg pain and swelling after fall down the stairs 1 week ago. She states the pain is moderate, she's been taking Tylenol at home. She states that after the fall she had minimal pain but did sustain an abrasion. The day after the fall however it started to become swollen and today it became very red. Her primary care provider sent her here for evaluation of a DVT. Of note, patient also wears oxygen at home, 2 L, no new increase in oxygen, no chest pain or shortness of breath but does have a cough and history of frequent pneumonias. No recent antibiotics. Sent home for chest x-ray as there was concern of pneumonia as well.  Past Medical History  Diagnosis Date  . 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 Asthma   . Syncope and collapse 2012    r/t anemia  . Anemia   . Dyspnea   . Mitral regurgitation     s/p MV repair complicated by diaphragmatic paralysis  . Fatigue   . Myoclonus   . Heart murmur     severe MR s/p MV repair  . GERD (gastroesophageal reflux disease)   . Allergic rhinitis   . Depression   . Gastritis     NSAID induced  . Asthma   . Hx of asplenia     surgical  . Osteoporosis   . Trochanteric bursitis     left hip  . Pulmonary HTN ([place])     mild to moderate by [place] 05/2012  . Exercise hypoxemia     supplemental O2 PRN  . Hypertension   . Atrial fibrillation ([place])     s/p RFA and Maze procedure - resolved  . Atrial tachycardia [facility])    Past Surgical History  Procedure Laterality Date  . Maze      with MV repair  . Ablation      afib  . Mitral valve repair  01/2010    with MAZE  . Myocardial window  2009    s/p afib ablation  . Tee without  cardioversion N/A 09/11/2013    Procedure: TRANSESOPHAGEAL ECHOCARDIOGRAM (TEE);  Surgeon: Jolaine Artist, MD;  Location: [facility] ENDOSCOPY;  Service: Cardiovascular;  Laterality: N/A;  . Cardioversion N/A 09/11/2013    Procedure: CARDIOVERSION;  Surgeon: Jolaine Artist, MD;  Location: [facility] ENDOSCOPY;  Service: Cardiovascular;  Laterality: N/A;  . Cardiac catheterization N/A 10/29/2014    Procedure: Right Heart Cath;  Surgeon: Larey Dresser, MD;  Location: [facility] CV LAB;  Service: Cardiovascular;  Laterality: N/A;   Family History  Problem Relation Age of Onset  . Breast cancer Mother 24    ER+  . Melanoma Father 61  . Adrenal disorder Sister     pheochromacytoma - rt. adrenal gland  . COPD Maternal Aunt   . Stomach cancer Paternal Uncle     onset in 82s  . Lung cancer Maternal Grandmother     died late 96s, heavy smoker  . Heart disease Maternal Grandfather     hardening of the arteries, poor circulation  . Ovarian cancer Sister 70    stage IV, now 47   Social History  Substance Use Topics  . Smoking status:  Never Smoker   . Smokeless tobacco: Never Used  . Alcohol Use: Yes     Comment: socially glass red wine monthly   OB History    No data available     Review of Systems  Constitutional: Positive for fever.  Allergic/Immunologic: Negative for immunocompromised state.  All other systems reviewed and are negative.     Allergies  Iodine; Shellfish allergy; Iohexol; and Levaquin  Home Medications   Prior to Admission medications   Medication Sig Start Date End Date Taking? Authorizing Provider  bisoprolol (ZEBETA) 5 MG tablet TAKE 1 TABLET(5 MG) BY MOUTH TWICE DAILY 10/31/15   Shaune Pascal Bensimhon, MD  BREO ELLIPTA 100-25 MCG/INH AEPB INHALE 1 PUFF BY MOUTH EVERY DAY 08/17/15   Collene Gobble, MD  calcium carbonate (OS-CAL) 600 MG TABS Take 600 mg by mouth 2 (two) times daily with a meal.    Historical Provider, MD  CARTIA XT 240 MG 24 hr capsule TAKE 1 CAPSULE BY  MOUTH DAILY 09/26/15   Jolaine Artist, MD  CHLORPHENIRAMINE MALEATE PO Take 4 mg by mouth at bedtime.    Historical Provider, MD  cholecalciferol (VITAMIN D) 1000 UNITS tablet Take 1,000 Units by mouth daily.    Historical Provider, MD  clindamycin (CLEOCIN) 300 MG capsule Take 1 capsule (300 mg total) by mouth 4 (four) times daily. 11/24/15   Varney Biles, MD  clonazePAM (KLONOPIN) 1 MG tablet Take 1.5 mg by mouth at bedtime as needed for anxiety.     Historical Provider, MD  deferasirox (EXJADE) 500 MG disintegrating tablet Take 1 tablet (500 mg total) by mouth 1 day or 1 dose. 08/22/15   Ladell Pier, MD  ELIQUIS 5 MG TABS tablet TAKE 1 TABLET BY MOUTH TWICE DAILY 07/26/15   Jolaine Artist, MD  EPINEPHrine (EPI-PEN) 0.3 mg/0.3 mL DEVI Inject 0.3 mg into the muscle once as needed.    Historical Provider, MD  escitalopram (LEXAPRO) 10 MG tablet Take 5 mg by mouth daily.  03/03/14   Historical Provider, MD  fexofenadine (ALLEGRA) 180 MG tablet Take 180 mg by mouth daily.     Historical Provider, MD  fluticasone (FLONASE) 50 MCG/ACT nasal spray Place 1 spray into both nostrils daily.    Historical Provider, MD  folic acid (FOLVITE) 1 MG tablet Take 1 mg by mouth daily.    Historical Provider, MD  lamoTRIgine (LAMICTAL) 25 MG tablet Take 25-50 mg by mouth at bedtime.    Historical Provider, MD  Multiple Vitamins-Minerals (AIRBORNE) CHEW Chew 1 tablet by mouth 2 (two) times daily.     Historical Provider, MD  NON FORMULARY Inhale 2 L into the lungs continuous. Oxygen at 2 l/min [place]    Historical Provider, MD  oxyCODONE-acetaminophen (PERCOCET/ROXICET) 5-325 MG tablet Take 1 tablet by mouth every 8 (eight) hours as needed for severe pain. 11/24/15   Varney Biles, MD  pantoprazole (PROTONIX) 40 MG tablet Take 40 mg by mouth daily.    Historical Provider, MD  potassium chloride SA (K-DUR,KLOR-CON) 20 MEQ tablet Take 3 tablets (60 mEq total) by mouth 2 (two) times daily. 09/15/15   Larey Dresser,  MD  Probiotic Product (ALIGN) 4 MG CAPS Take 1 tablet by mouth daily.    Historical Provider, MD  raloxifene (EVISTA) 60 MG tablet Take 60 mg by mouth daily.    Historical Provider, MD  spironolactone (ALDACTONE) 25 MG tablet TAKE 1/2 TABLET(12.5 MG) BY MOUTH DAILY 10/19/15   Larey Dresser, MD  torsemide (DEMADEX) 20 MG tablet TAKE 4 TABLETS BY MOUTH DAILY 11/22/15   Jolaine Artist, MD  traMADol (ULTRAM) 50 MG tablet Take 50 mg by mouth. 3-4 times daily    Historical Provider, MD  [place] Name: CPAP    Historical Provider, MD   BP 127/79 mmHg  Pulse 63  Temp(Src) 98.3 F (36.8 C) (Oral)  Resp 20  Wt 77.565 kg  SpO2 99% Physical Exam  Constitutional: She is oriented to person, place, and time. She appears well-developed and well-nourished. No distress.  HENT:  Head: Normocephalic and atraumatic.  Eyes: Conjunctivae are normal. Right eye exhibits no discharge. Left eye exhibits no discharge.  Neck: Normal range of motion. Neck supple.  Cardiovascular: Normal rate and regular rhythm.   Pulmonary/Chest: Effort normal and breath sounds normal. No respiratory distress.  Abdominal: Soft. Bowel sounds are normal. She exhibits no distension and no mass. There is no tenderness. There is no rebound and no guarding.  Musculoskeletal: Normal range of motion. She exhibits edema.  Right lower extremity exam 2+ dorsalis pedis and posterior tibialis pulse Normal sensation along the dorsum, plantar aspect of the foot extending to the toes and proximal at the ankle Full range of motion of the ankle joint and knee joint Inspection reveals erythematous area at the mid shaft and extending proximally to the knee, site demarcated Warm to touch Abrasion without purulent discharge and small area of swelling along the lateral aspect of the shin area  Neurological: She is alert and oriented to person, place, and time.  Skin: Skin is warm. No rash noted.  Psychiatric: She has a normal mood and  affect.  Nursing note and vitals reviewed.  ED Course  Procedures (including critical care time) Labs Review Labs Reviewed  CBC WITH DIFFERENTIAL/PLATELET - Abnormal; Notable for the following:    WBC 14.6 (*)    RBC 2.57 (*)    Hemoglobin 8.9 (*)    HCT 28.9 (*)    MCV 112.5 (*)    MCH 34.6 (*)    Platelets 486 (*)    Neutro Abs 9.7 (*)    Monocytes Absolute 2.5 (*)    All other components within normal limits  I-STAT CHEM 8, ED - Abnormal; Notable for the following:    Potassium 3.4 (*)    Chloride 95 (*)    Glucose, Bld 129 (*)    Hemoglobin 10.2 (*)    HCT 30.0 (*)    All other components within normal limits    Imaging Review Dg Chest 2 View  11/24/2015  CLINICAL DATA:  Productive cough, coughing up blood, and fever for 2 months, history asthma, pulmonary hypertension, atrial fibrillation, GERD EXAM: CHEST  2 VIEW COMPARISON:  07/06/2014 FINDINGS: Enlargement of cardiac silhouette post MVR. Pulmonary vascular congestion. Tortuous thoracic aorta. Enlarged central pulmonary arteries consistent with history of pulmonary hypertension. Mild chronic accentuation of interstitial markings, stable. No definite acute pulmonary edema, segmental consolidation, pleural effusion or pneumothorax. Mild elevation of RIGHT diaphragm. Bones demineralized. IMPRESSION: Enlargement of cardiac silhouette with pulmonary vascular congestion. Chronic accentuation of interstitial markings without definite acute edema or infiltrate. Electronically Signed   By: Lavonia Dana M.D.   On: 11/24/2015 15:27   Dg Tibia/fibula Right  11/24/2015  CLINICAL DATA:  Right lower extremity pain and swelling following fall down stairs 4 days ago. Initial encounter. EXAM: RIGHT TIBIA AND FIBULA - 2 VIEW COMPARISON:  None. FINDINGS: There is no evidence of acute fracture, subluxation or  dislocation. Soft tissue swelling is noted. No focal bony lesions are identified. IMPRESSION: Soft tissue swelling without acute bony  abnormality. Electronically Signed   By: Margarette Canada M.D.   On: 11/24/2015 21:00   I have personally reviewed and evaluated these images and lab results as part of my medical decision-making.   EKG Interpretation None      MDM   Final diagnoses:  Fall  Cellulitis, unspecified cellulitis site, unspecified extremity site, unspecified laterality   DVT study negative. X-ray without evidence of fracture. Patient likely developed cellulitis secondary to abrasion. No immunocompromise and swelling is unilateral, doubt CHF exacerbation. Patient is not short of breath or was chest pain. Cough is likely not secondary to a pneumonia based on chest x-ray. Patient is on her home oxygen requirement. Doubt ACS, PE, pneumonia. Patient to follow-up for reevaluation in 48 hours. Site demarcated. Clindamycin oral started. Strict return precautions given. Patient verbalized understanding and agreement to return for any worsening symptoms including confusion, lightheadedness, syncope, chills, or other concerning symptoms. Patient discharged in good condition.    Karma Greaser, MD 11/24/15 [id number]  Varney Biles, MD 11/26/15 [phone]

## 2015-11-24 NOTE — ED Notes (Signed)
Patient transported to X-ray 

## 2015-12-01 DIAGNOSIS — M79604 Pain in right leg: Secondary | ICD-10-CM | POA: Diagnosis not present

## 2015-12-01 DIAGNOSIS — L03115 Cellulitis of right lower limb: Secondary | ICD-10-CM | POA: Diagnosis not present

## 2015-12-14 DIAGNOSIS — M79604 Pain in right leg: Secondary | ICD-10-CM | POA: Diagnosis not present

## 2015-12-14 DIAGNOSIS — L03115 Cellulitis of right lower limb: Secondary | ICD-10-CM | POA: Diagnosis not present

## 2015-12-21 ENCOUNTER — Telehealth: Payer: Self-pay | Admitting: Oncology

## 2015-12-21 NOTE — Telephone Encounter (Signed)
pt cld to r/s appt for lab to 8/14 @11 

## 2015-12-22 ENCOUNTER — Other Ambulatory Visit: Payer: Medicare Other

## 2015-12-30 ENCOUNTER — Ambulatory Visit (HOSPITAL_COMMUNITY)
Admission: RE | Admit: 2015-12-30 | Discharge: 2015-12-30 | Disposition: A | Discharge status: Home / Self Care | Payer: Medicare Other | Source: Ambulatory Visit | Attending: Internal Medicine | Admitting: Internal Medicine

## 2015-12-30 VITALS — BP 138/74 | HR 76 | Wt 169.0 lb

## 2015-12-30 DIAGNOSIS — G253 Myoclonus: Secondary | ICD-10-CM | POA: Insufficient documentation

## 2015-12-30 DIAGNOSIS — E876 Hypokalemia: Secondary | ICD-10-CM | POA: Diagnosis not present

## 2015-12-30 DIAGNOSIS — I341 Nonrheumatic mitral (valve) prolapse: Secondary | ICD-10-CM | POA: Insufficient documentation

## 2015-12-30 DIAGNOSIS — M7060 Trochanteric bursitis, unspecified hip: Secondary | ICD-10-CM | POA: Diagnosis not present

## 2015-12-30 DIAGNOSIS — J45909 Unspecified asthma, uncomplicated: Secondary | ICD-10-CM | POA: Diagnosis not present

## 2015-12-30 DIAGNOSIS — E669 Obesity, unspecified: Secondary | ICD-10-CM | POA: Diagnosis not present

## 2015-12-30 DIAGNOSIS — I272 Other secondary pulmonary hypertension: Secondary | ICD-10-CM | POA: Insufficient documentation

## 2015-12-30 DIAGNOSIS — I5032 Chronic diastolic (congestive) heart failure: Secondary | ICD-10-CM | POA: Diagnosis not present

## 2015-12-30 DIAGNOSIS — F329 Major depressive disorder, single episode, unspecified: Secondary | ICD-10-CM | POA: Insufficient documentation

## 2015-12-30 DIAGNOSIS — R011 Cardiac murmur, unspecified: Secondary | ICD-10-CM | POA: Diagnosis not present

## 2015-12-30 DIAGNOSIS — I471 Supraventricular tachycardia: Secondary | ICD-10-CM | POA: Insufficient documentation

## 2015-12-30 DIAGNOSIS — K219 Gastro-esophageal reflux disease without esophagitis: Secondary | ICD-10-CM | POA: Diagnosis not present

## 2015-12-30 DIAGNOSIS — Z7901 Long term (current) use of anticoagulants: Secondary | ICD-10-CM | POA: Insufficient documentation

## 2015-12-30 DIAGNOSIS — I340 Nonrheumatic mitral (valve) insufficiency: Secondary | ICD-10-CM | POA: Diagnosis not present

## 2015-12-30 DIAGNOSIS — G4733 Obstructive sleep apnea (adult) (pediatric): Secondary | ICD-10-CM | POA: Diagnosis not present

## 2015-12-30 DIAGNOSIS — Z9981 Dependence on supplemental oxygen: Secondary | ICD-10-CM | POA: Insufficient documentation

## 2015-12-30 DIAGNOSIS — I110 Hypertensive heart disease with heart failure: Secondary | ICD-10-CM | POA: Insufficient documentation

## 2015-12-30 DIAGNOSIS — R0600 Dyspnea, unspecified: Secondary | ICD-10-CM | POA: Insufficient documentation

## 2015-12-30 DIAGNOSIS — D589 Hereditary hemolytic anemia, unspecified: Secondary | ICD-10-CM | POA: Diagnosis not present

## 2015-12-30 DIAGNOSIS — R55 Syncope and collapse: Secondary | ICD-10-CM | POA: Insufficient documentation

## 2015-12-30 DIAGNOSIS — I480 Paroxysmal atrial fibrillation: Secondary | ICD-10-CM | POA: Diagnosis not present

## 2015-12-30 LAB — BASIC METABOLIC PANEL
Anion gap: 7 (ref 5–15)
BUN: 16 mg/dL (ref 6–20)
CALCIUM: 9.4 mg/dL (ref 8.9–10.3)
CO2: 31 mmol/L (ref 22–32)
CREATININE: 0.93 mg/dL (ref 0.44–1.00)
Chloride: 97 mmol/L — ABNORMAL LOW (ref 101–111)
GFR calc non Af Amer: >60 mL/min (ref >60)
Glucose, Bld: 96 mg/dL (ref 65–99)
Potassium: 4.2 mmol/L (ref 3.5–5.1)
SODIUM: 135 mmol/L (ref 135–145)

## 2015-12-30 LAB — BRAIN NATRIURETIC PEPTIDE: B NATRIURETIC PEPTIDE 5: 443 pg/mL — AB (ref 0.0–100.0)

## 2015-12-30 NOTE — Progress Notes (Signed)
Patient ID: [name], female   DOB: [birth year], 65 y.o.   MRN: [id number] Patient ID: [name], female   DOB: 1951/01/09, 65 y.o.   MRN: [id number]    Advanced Heart Failure Clinic Note   PCP: Dr Inda Merlin  HF: Dr. Haroldine Laws   HPI: [name] is a 65 y/o woman with h/o HTN, pyruvate kinase deficiency with hemolytic anemia, hemochromatosis, obesity, asthma, OSA (on CPAP), mitral valve prolapse s/p mini MV repair/Maze (2011 with Dr. [name]), PAF s/p RFA followed by surgical Maze [id number] and diastolic dysfunction.  Pre-op cath with normal coronaries in 2011. 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   Baseline hgb 7-9. Doesn't get transfused unless 6 or below.  She presents today for PAH follow up. Last seen in clinic 11/2014. Just prior to that had been switched to torsemide.   Last right heart cath in 6/16. Filling pressures were elevated, and Lasix switched to torsemide.  She had a fall 2 months ago and had cellulitis in right leg. Now better. Overall, feels ok.  Continues on CPAP and continuous oxygen. Weight relatively stable 165-167 at home. Takes extra torsemide if weight goes up. She walks on track for 30-40 mins 2x/week. About 20 mins per mile. Goesn out with her husband every day   She has chronic 2 pillow orthopnea.  No chest pain, no lightheadedness.  No tachypalpitations.  She continues Eliquis with no BRBPR or melena.   [place] 06/24/12 RA = 10  RV = 60/7/12  PA = 58/25 (40)  PCW = 19 (no significant v-waves)  Fick cardiac output/index = 6.6/3.7  PVR = 3.1 [place]  FA sat = 91%  PA sat = 66%, 67% PVR was not significantly elevated so selective pulmonary vasodilators were noted started.   RHC 6/16 RA = 17 PA = 74/31 (46) PCWP = 27 CI 3.89 PVR 2.8 WU  Echo 12/13 EF 60-65% mild LVH. Grade 2 DD. MVR stable (no MS noted). Moderate LAE. RV mildly dilated. Mod-severe TR with estimated RVSP 80-90 range (in 2011 was 40-45 mm HG) ECHO 10/22/12 EF 55% RV  mildly decreased + Septal bounce. Moderate TR. RVSP 65-70. +diastolic dysfunction (likely restrictive - but tissue Doppler not performed).  ECHO 05/17/2014: EF 50-55%  TEE (4/15): EF 60-65%, severe bilateral atrial enlargement, s/p MV ring with no MR and mild mitral stenosis with meean gradient 6, MVA 1.7 cm^2.  PASP on TTE in 4/15 was 60 mmHg.  Echo (6/16): EF 50-55%, moderate diastolic dysfunction, D-shaped interventricular septum, moderately dilated RV with mildly decreased systolic function, PA systolic pressure 60 mmHg, IVC not dilated, s/p MV repair with mild MS and trivial MR.   Labs (7/14): Potassium 4.6 BUN 16 Creatinine 0.68 hemoglobin 9, LDL 63 Labs (4/15): K 4.3, creatinine 0.9, HCT 26.5 Labs 09/21/13:  K 3.5  Labs 10/21/13 K 4.1 Creatinine 0.76 Labs 7/15: Hgb 9.1 Labs (6/16): K 3.9, creatinine 0.88 => 0.95, HCT 29.1, plts 512, BNP 811 Labs (11/24/15): K 3.4, creatinine 0.90   ROS: All systems negative except as listed in HPI, PMH and Problem List.  Past Medical History:  Diagnosis Date  . Allergic rhinitis   . Anemia   . Asthma   . Asthma   . Atrial fibrillation ([place])    s/p RFA and Maze procedure - resolved  . Atrial tachycardia ([place])   . Depression   . Dyspnea   . Exercise hypoxemia    supplemental O2 PRN  .  Fatigue   . Gastritis    NSAID induced  . GERD (gastroesophageal reflux disease)   . Heart murmur    severe MR s/p MV repair  . Hx of asplenia    surgical  . Hypertension   . 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 Myoclonus   . Osteoporosis   . Pulmonary HTN ([place])    mild to moderate by [place] 05/2012  . 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 Syncope and collapse 2012   r/t anemia  . Trochanteric bursitis    left hip    Current Outpatient Prescriptions  Medication Sig Dispense Refill  . bisoprolol (ZEBETA) 5 MG tablet TAKE 1 TABLET(5 MG) BY MOUTH TWICE DAILY 180 tablet 0  . BREO ELLIPTA 100-25  MCG/INH AEPB INHALE 1 PUFF BY MOUTH EVERY DAY 60 each 5  . calcium carbonate (OS-CAL) 600 MG TABS Take 600 mg by mouth 2 (two) times daily with a meal.    . CARTIA XT 240 MG 24 hr capsule TAKE 1 CAPSULE BY MOUTH DAILY 30 capsule 3  . CHLORPHENIRAMINE MALEATE PO Take 4 mg by mouth at bedtime.    . cholecalciferol (VITAMIN D) 1000 UNITS tablet Take 1,000 Units by mouth daily.    . clonazePAM (KLONOPIN) 1 MG tablet Take 1.5 mg by mouth at bedtime as needed for anxiety.     Marland Kitchen deferasirox (EXJADE) 500 MG disintegrating tablet Take 1,500 mg by mouth daily before breakfast.    . ELIQUIS 5 MG TABS tablet TAKE 1 TABLET BY MOUTH TWICE DAILY 60 tablet 6  . EPINEPHrine (EPI-PEN) 0.3 mg/0.3 mL DEVI Inject 0.3 mg into the muscle once as needed.    Marland Kitchen escitalopram (LEXAPRO) 10 MG tablet Take 10 mg by mouth daily.     . fexofenadine (ALLEGRA) 180 MG tablet Take 180 mg by mouth daily.     . fluticasone (FLONASE) 50 MCG/ACT nasal spray Place 1 spray into both nostrils daily.    . folic acid (FOLVITE) 1 MG tablet Take 1 mg by mouth daily.    Marland Kitchen lamoTRIgine (LAMICTAL) 25 MG tablet Take 25 mg by mouth at bedtime.     . Multiple Vitamins-Minerals (AIRBORNE) CHEW Chew 1 tablet by mouth 2 (two) times daily.     . NON FORMULARY Inhale 2 L into the lungs continuous. Oxygen at 2 l/min [place]    . oxyCODONE-acetaminophen (PERCOCET/ROXICET) 5-325 MG tablet Take 1 tablet by mouth every 8 (eight) hours as needed for severe pain. 6 tablet 0  . pantoprazole (PROTONIX) 40 MG tablet Take 40 mg by mouth daily.    . potassium chloride SA (K-DUR,KLOR-CON) 20 MEQ tablet Take 3 tablets (60 mEq total) by mouth 2 (two) times daily. 180 tablet 3  . Probiotic Product (ALIGN) 4 MG CAPS Take 1 tablet by mouth daily.    . raloxifene (EVISTA) 60 MG tablet Take 60 mg by mouth daily.    Marland Kitchen spironolactone (ALDACTONE) 25 MG tablet TAKE 1/2 TABLET(12.5 MG) BY MOUTH DAILY 45 tablet 0  . torsemide (DEMADEX) 20 MG tablet TAKE 4 TABLETS BY MOUTH DAILY 360  tablet 1  . traMADol (ULTRAM) 50 MG tablet Take 50 mg by mouth every 6 (six) hours as needed for moderate pain. 3-4 times daily    . UNABLE TO FIND Med Name: CPAP     No current facility-administered medications for this encounter.    Social History  Substance Use Topics  .  Smoking status: Never Smoker  . Smokeless tobacco: Never Used  . Alcohol use Yes     Comment: socially glass red wine monthly   Family History  Problem Relation Age of Onset  . Breast cancer Mother 43    ER+  . Melanoma Father 16  . Adrenal disorder Sister     pheochromacytoma - rt. adrenal gland  . COPD Maternal Aunt   . Stomach cancer Paternal Uncle     onset in 103s  . Lung cancer Maternal Grandmother     died late 65s, heavy smoker  . Heart disease Maternal Grandfather     hardening of the arteries, poor circulation  . Ovarian cancer Sister 54    stage IV, now 83    Vitals:   12/30/15 1520  BP: 138/74  Pulse: 76  SpO2: 90%  Weight: 169 lb (76.7 kg)     PHYSICAL EXAM: General: NAD wearing O2 HEENT: normal Neck: supple. JVP 7  Carotids 2+ bilaterally; no bruits. No lymphadenopathy or thryomegaly appreciated. Cor: PMI normal. Regular S1S2. No rubs, gallops.  1/6 HSM LLSB.  Loud P2 no RV lift  Lungs: clear 2 liters , no wheezing or crackles Abdomen: soft, nontender, nondistended. No hepatosplenomegaly. No bruits or masses. Good bowel sounds. Extremities: no cyanosis, clubbing, rash, mild edema and bruise on RLE. Left ok Neuro: alert & orientedx3, cranial nerves grossly intact. Moves all 4 extremities w/o difficulty. Affect pleasant.  ASSESSMENT & PLAN:  1. Atrial fibrillation: Paroxysmal. In NSR today. Continue apixaban. No bleeding problems noted.  CBC has been stable. - Continue bisoprolol and diltiazem CD.  2. Pulmonary arterial hypertension: PVR was only 2.8 WU on 6/16 RHC. The PAH appears to be primarily Group 3 in the setting of OSA and chronic respiratory failure (on home oxygen,  ?OHS).  Echo 6/16 showed that the RV is moderately dilated with mildly decreased systolic function.  - Do not think that there is a role for pulmonary vasodilators.  - Continue OSA at night and oxygen during the day.  - Will repeat echo 3. Chronic diastolic CHF: Volume status much improved on torsemide.   - Continue torsemide 80 mg daily.  Can take extra prn. BMET/BNP today.  4. OHS/OSA: Stable on home oxygen and CPAP.  5. 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  This has been stable.  Follows with Dr. Benay Spice.  6. S/p MV repair: Stable by echo with mild stenosis and no significant regurgitation.  7. Hypokalemia: check labs today  Glori Bickers, MD 12/30/2015

## 2015-12-30 NOTE — Addendum Note (Signed)
Encounter addended by: Scarlette Calico, RN on: 12/30/2015  3:52 PM<BR>    Actions taken: Visit diagnoses modified, Order Entry activity accessed, Diagnosis association updated, Sign clinical note

## 2015-12-30 NOTE — Patient Instructions (Signed)
Labs today  We will contact you in 4 months to schedule your next appointment.  

## 2015-12-30 NOTE — Progress Notes (Signed)
Deleted

## 2016-01-09 ENCOUNTER — Other Ambulatory Visit (HOSPITAL_BASED_OUTPATIENT_CLINIC_OR_DEPARTMENT_OTHER): Payer: Medicare Other

## 2016-01-09 DIAGNOSIS — D594 Other nonautoimmune hemolytic anemias: Secondary | ICD-10-CM

## 2016-01-09 DIAGNOSIS — D552 Anemia due to disorders of glycolytic enzymes: Secondary | ICD-10-CM

## 2016-01-09 LAB — CBC WITH DIFFERENTIAL/PLATELET
BASO%: 0.6 % (ref 0.0–2.0)
Basophils Absolute: 0.1 10*3/uL (ref 0.0–0.1)
EOS%: 4.9 % (ref 0.0–7.0)
Eosinophils Absolute: 0.5 10*3/uL (ref 0.0–0.5)
HCT: 27.1 % — ABNORMAL LOW (ref 34.8–46.6)
HGB: 8.3 g/dL — ABNORMAL LOW (ref 11.6–15.9)
LYMPH#: 2.6 10*3/uL (ref 0.9–3.3)
LYMPH%: 26.4 % (ref 14.0–49.7)
MCH: 34.4 pg — AB (ref 25.1–34.0)
MCHC: 30.6 g/dL — AB (ref 31.5–36.0)
MCV: 112.4 fL — ABNORMAL HIGH (ref 79.5–101.0)
MONO#: 1.5 10*3/uL — ABNORMAL HIGH (ref 0.1–0.9)
MONO%: 14.6 % — ABNORMAL HIGH (ref 0.0–14.0)
NEUT#: 5.3 10*3/uL (ref 1.5–6.5)
NEUT%: 53.5 % (ref 38.4–76.8)
Platelets: 559 10*3/uL — ABNORMAL HIGH (ref 145–400)
RBC: 2.41 10*6/uL — AB (ref 3.70–5.45)
RDW: 16.6 % — ABNORMAL HIGH (ref 11.2–14.5)
WBC: 9.9 10*3/uL (ref 3.9–10.3)
nRBC: 1 % — ABNORMAL HIGH (ref 0–0)

## 2016-01-09 LAB — FERRITIN: FERRITIN: 68 ng/mL (ref 9–269)

## 2016-01-10 ENCOUNTER — Other Ambulatory Visit: Payer: Self-pay | Admitting: Cardiology

## 2016-01-10 ENCOUNTER — Telehealth: Payer: Self-pay | Admitting: *Deleted

## 2016-01-10 NOTE — Telephone Encounter (Signed)
-----   Message from Ladell Pier, MD sent at 01/09/2016  6:37 PM EDT ----- Please call patient, ferritin is ok, f/u as scheduled

## 2016-01-10 NOTE — Telephone Encounter (Signed)
Called pt with ferritin result. She voiced understanding, will follow up as scheduled.

## 2016-01-20 ENCOUNTER — Other Ambulatory Visit (HOSPITAL_COMMUNITY): Payer: Self-pay | Admitting: Cardiology

## 2016-01-20 ENCOUNTER — Other Ambulatory Visit (HOSPITAL_COMMUNITY): Payer: Self-pay | Admitting: Internal Medicine

## 2016-01-24 ENCOUNTER — Other Ambulatory Visit (HOSPITAL_COMMUNITY): Payer: Self-pay | Admitting: Cardiology

## 2016-02-01 ENCOUNTER — Other Ambulatory Visit (HOSPITAL_COMMUNITY): Payer: Self-pay | Admitting: Internal Medicine

## 2016-02-07 ENCOUNTER — Other Ambulatory Visit: Payer: Self-pay | Admitting: Emergency Medicine

## 2016-02-13 ENCOUNTER — Other Ambulatory Visit (HOSPITAL_COMMUNITY): Payer: Self-pay | Admitting: Internal Medicine

## 2016-02-14 ENCOUNTER — Telehealth (HOSPITAL_COMMUNITY): Payer: Self-pay

## 2016-02-14 MED ORDER — APIXABAN 5 MG PO TABS: 5.0000 mg | ORAL_TABLET | Freq: Two times a day (BID) | ORAL | 6 refills | Status: DC

## 2016-02-14 MED ORDER — BISOPROLOL FUMARATE 5 MG PO TABS: ORAL_TABLET | ORAL | 3 refills | Status: DC

## 2016-02-14 NOTE — Telephone Encounter (Signed)
Pharmacy called CHF clinic triage line to reques trefills for patient. Bisoprolol # 180 Eliquis # 25 Sent electronically as requested.  Renee Pain, RN

## 2016-02-17 ENCOUNTER — Other Ambulatory Visit: Payer: Self-pay | Admitting: *Deleted

## 2016-02-17 ENCOUNTER — Telehealth: Payer: Self-pay | Admitting: *Deleted

## 2016-02-17 MED ORDER — DEFERASIROX 500 MG PO TBSO: 1500.0000 mg | ORAL_TABLET | Freq: Every day | ORAL | Status: DC

## 2016-02-17 NOTE — Telephone Encounter (Signed)
Message left for patient to return call regarding refill request from [facility] for Exjade to confirm correct pharmacy for refill.

## 2016-02-19 ENCOUNTER — Other Ambulatory Visit (HOSPITAL_COMMUNITY): Payer: Self-pay | Admitting: Internal Medicine

## 2016-02-21 ENCOUNTER — Other Ambulatory Visit (HOSPITAL_COMMUNITY): Payer: Self-pay | Admitting: *Deleted

## 2016-02-21 NOTE — Telephone Encounter (Signed)
Call from pt returning call re: Exjade pharmacy. She refills through [facility]. She plans to call for a refill later this week.

## 2016-02-23 DIAGNOSIS — D2262 Melanocytic nevi of left upper limb, including shoulder: Secondary | ICD-10-CM | POA: Diagnosis not present

## 2016-02-23 DIAGNOSIS — L821 Other seborrheic keratosis: Secondary | ICD-10-CM | POA: Diagnosis not present

## 2016-02-23 DIAGNOSIS — D225 Melanocytic nevi of trunk: Secondary | ICD-10-CM | POA: Diagnosis not present

## 2016-02-23 DIAGNOSIS — L720 Epidermal cyst: Secondary | ICD-10-CM | POA: Diagnosis not present

## 2016-02-23 DIAGNOSIS — L814 Other melanin hyperpigmentation: Secondary | ICD-10-CM | POA: Diagnosis not present

## 2016-02-23 DIAGNOSIS — D2261 Melanocytic nevi of right upper limb, including shoulder: Secondary | ICD-10-CM | POA: Diagnosis not present

## 2016-02-23 DIAGNOSIS — L300 Nummular dermatitis: Secondary | ICD-10-CM | POA: Diagnosis not present

## 2016-03-08 DIAGNOSIS — G4733 Obstructive sleep apnea (adult) (pediatric): Secondary | ICD-10-CM | POA: Diagnosis not present

## 2016-03-14 ENCOUNTER — Ambulatory Visit (INDEPENDENT_AMBULATORY_CARE_PROVIDER_SITE_OTHER)
Admission: RE | Admit: 2016-03-14 | Discharge: 2016-03-14 | Disposition: A | Discharge status: Home / Self Care | Payer: Medicare Other | Source: Ambulatory Visit | Attending: Adult Health | Admitting: Adult Health

## 2016-03-14 ENCOUNTER — Ambulatory Visit (INDEPENDENT_AMBULATORY_CARE_PROVIDER_SITE_OTHER): Payer: Medicare Other | Admitting: Adult Health

## 2016-03-14 ENCOUNTER — Encounter: Payer: Self-pay | Admitting: Adult Health

## 2016-03-14 VITALS — BP 136/80 | HR 68 | Temp 98.7°F | Ht 62.25 in | Wt 171.0 lb

## 2016-03-14 DIAGNOSIS — R0602 Shortness of breath: Secondary | ICD-10-CM | POA: Diagnosis not present

## 2016-03-14 DIAGNOSIS — R6889 Other general symptoms and signs: Secondary | ICD-10-CM

## 2016-03-14 DIAGNOSIS — J9611 Chronic respiratory failure with hypoxia: Secondary | ICD-10-CM

## 2016-03-14 DIAGNOSIS — J069 Acute upper respiratory infection, unspecified: Secondary | ICD-10-CM

## 2016-03-14 DIAGNOSIS — R05 Cough: Secondary | ICD-10-CM | POA: Diagnosis not present

## 2016-03-14 LAB — POCT INFLUENZA A/B
Influenza A, POC: NEGATIVE
Influenza B, POC: NEGATIVE

## 2016-03-14 MED ORDER — AMOXICILLIN-POT CLAVULANATE 875-125 MG PO TABS: 1.0000 | ORAL_TABLET | Freq: Two times a day (BID) | ORAL | 0 refills | Status: DC

## 2016-03-14 MED ORDER — PREDNISONE 10 MG PO TABS: ORAL_TABLET | ORAL | 0 refills | Status: DC

## 2016-03-14 MED ORDER — ALBUTEROL SULFATE HFA 108 (90 BASE) MCG/ACT IN AERS: 2.0000 | INHALATION_SPRAY | Freq: Four times a day (QID) | RESPIRATORY_TRACT | 2 refills | Status: AC | PRN

## 2016-03-14 NOTE — Patient Instructions (Addendum)
We will check a chest xray on your way out today  augmentin 1 tab twice daily for 7 days  Prednisone taper - as directed on bottle  Continue Breo  Will send a prescription for albuterol inhaler 1-2 puffs every 6 hours as needed  Follow up with Dr. Lamonte Sakai in 1-2 months  Please contact office for sooner follow up if symptoms do not improve or worsen or seek emergency care

## 2016-03-14 NOTE — Progress Notes (Signed)
Chief Complaint  Patient presents with  . Acute Visit    Cough, fever; had flu shot last wednesday started immediately after flu shot.  wheezing, raspy voice, struggling with SOB. coughing and blowing out green mucus.  Needs to get Rx for rescue inhaler.     Tests   Past medical hx Past Medical History:  Diagnosis Date  . Allergic rhinitis   . Anemia   . Asthma   . Asthma   . Atrial fibrillation ([place])    s/p RFA and Maze procedure - resolved  . Atrial tachycardia ([place])   . Depression   . Dyspnea   . Exercise hypoxemia    supplemental O2 PRN  . Fatigue   . Gastritis    NSAID induced  . GERD (gastroesophageal reflux disease)   . Heart murmur    severe MR s/p MV repair  . Hx of asplenia    surgical  . Hypertension   . 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 Myoclonus   . Osteoporosis   . Pulmonary HTN    mild to moderate by [place] 05/2012  . 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 Syncope and collapse 2012   r/t anemia  . Trochanteric bursitis    left hip     Past surgical hx, Allergies, Family hx, Social hx all reviewed.  Current Outpatient Prescriptions on File Prior to Visit  Medication Sig  . apixaban (ELIQUIS) 5 MG TABS tablet Take 1 tablet (5 mg total) by mouth 2 (two) times daily.  . bisoprolol (ZEBETA) 5 MG tablet TAKE 1 TABLET(5 MG) BY MOUTH TWICE DAILY  . BREO ELLIPTA 100-25 MCG/INH AEPB INHALE 1 PUFF BY MOUTH EVERY DAY  . calcium carbonate (OS-CAL) 600 MG TABS Take 600 mg by mouth 2 (two) times daily with a meal.  . CARTIA XT 240 MG 24 hr capsule TAKE 1 CAPSULE BY MOUTH DAILY  . CHLORPHENIRAMINE MALEATE PO Take 4 mg by mouth at bedtime.  . cholecalciferol (VITAMIN D) 1000 UNITS tablet Take 1,000 Units by mouth daily.  . clonazePAM (KLONOPIN) 1 MG tablet Take 1.5 mg by mouth at bedtime as needed for anxiety.   Marland Kitchen deferasirox (EXJADE) 500 MG disintegrating tablet Take 3 tablets (1,500 mg total)  by mouth daily before breakfast. Faxed to [facility] on 02/17/16  . EPINEPHrine (EPI-PEN) 0.3 mg/0.3 mL DEVI Inject 0.3 mg into the muscle once as needed.  Marland Kitchen escitalopram (LEXAPRO) 10 MG tablet Take 10 mg by mouth daily.   . fexofenadine (ALLEGRA) 180 MG tablet Take 180 mg by mouth daily.   . fluticasone (FLONASE) 50 MCG/ACT nasal spray Place 1 spray into both nostrils daily.  . folic acid (FOLVITE) 1 MG tablet Take 1 mg by mouth daily.  Marland Kitchen lamoTRIgine (LAMICTAL) 25 MG tablet Take 25 mg by mouth at bedtime.   . Multiple Vitamins-Minerals (AIRBORNE) CHEW Chew 1 tablet by mouth 2 (two) times daily.   . NON FORMULARY Inhale 2 L into the lungs continuous. Oxygen at 2 l/min [place]  . oxyCODONE-acetaminophen (PERCOCET/ROXICET) 5-325 MG tablet Take 1 tablet by mouth every 8 (eight) hours as needed for severe pain.  . pantoprazole (PROTONIX) 40 MG tablet Take 40 mg by mouth daily.  . potassium chloride SA (K-DUR,KLOR-CON) 20 MEQ tablet TAKE 3 TABLETS(60 MEQ) BY MOUTH TWICE DAILY  . Probiotic Product (ALIGN) 4 MG CAPS Take 1 tablet by mouth daily.  . raloxifene (EVISTA) 60 MG tablet Take  60 mg by mouth daily.  Marland Kitchen spironolactone (ALDACTONE) 25 MG tablet TAKE 1/2 TABLET(12.5 MG) BY MOUTH DAILY  . torsemide (DEMADEX) 20 MG tablet TAKE 4 TABLETS BY MOUTH DAILY  . traMADol (ULTRAM) 50 MG tablet Take 50 mg by mouth every 6 (six) hours as needed for moderate pain. 3-4 times daily  . UNABLE TO FIND Med Name: CPAP   No current facility-administered medications on file prior to visit.      Vital Signs BP 136/80 (BP Location: Left Arm, Cuff Size: Normal)   Pulse 68   Temp 98.7 F (37.1 C) (Oral)   Ht 5' 2.25" (1.581 m)   Wt 171 lb (77.6 kg)   SpO2 91%   BMI 31.03 kg/m   History of Present Illness [name] is a 65 y.o. 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  She presents today for acute OV with c/o 1 week hx increased wheezing, SOB above baseline.  Low grade fever, Tmax 100, myalgias.   Initially had dry cough, has now progressed and is productive of green sputum, hoarse voice, SOB above baseline, chest tightness.   No BLE edema, chest pain, hemoptysis, no sinus pain/pressure, orthopnea, PND, leg/calf pain, sick contacts, recent travel. No n/v/d.    Physical Exam  General - pleasant female, uncomfortable appearing but No distress  ENT - No sinus tenderness, no oral exudate, no LAN Cardiac - s1s2 regular, no murmur Chest - resps even non labored on 2L pulse nasal cannula, diffuse exp wheeze bilat upper lobes, no crackles, diminished bases L>R  Back - No focal tenderness Abd - Soft, non-tender Ext - No edema Neuro - Normal strength Skin - No rashes Psych - normal mood, and behavior   Assessment/Plan Chronic respiratory failure  Asthma  URI - given complex hx will give short coarse abx and short steroid taper given her bronchospasm.  Flu PCR neg.  Check CXR to r/o CAP.    Patient Instructions  We will check a chest xray on your way out today  augmentin 1 tab twice daily for 7 days  Prednisone taper - as directed on bottle  Continue Breo  Will send a prescription for albuterol inhaler 1-2 puffs every 6 hours as needed  Follow up with Dr. Lamonte Sakai in 1-2 months  Please contact office for sooner follow up if symptoms do not improve or worsen or seek emergency care     Nickolas Madrid, NP 03/14/2016  12:42 PM

## 2016-03-23 ENCOUNTER — Telehealth: Payer: Self-pay | Admitting: *Deleted

## 2016-03-23 NOTE — Telephone Encounter (Signed)
Call from pt requesting assistance in applying for assistance with Exjade for 2018. Reviewed with Denyse Amass, PharmD: Pt can go online to apply at [facility]. May not be able to renew until March. Left message on voicemail informing pt.

## 2016-03-30 ENCOUNTER — Telehealth: Payer: Self-pay | Admitting: *Deleted

## 2016-03-30 MED ORDER — DEFERASIROX 500 MG PO TBSO: 1500.0000 mg | ORAL_TABLET | Freq: Every day | ORAL | 1 refills | Status: DC

## 2016-03-30 NOTE — Telephone Encounter (Signed)
Message from pt requesting refill on Exjade to be sent to [facility]. Same done.

## 2016-04-02 ENCOUNTER — Telehealth: Payer: Self-pay | Admitting: *Deleted

## 2016-04-02 ENCOUNTER — Encounter (HOSPITAL_COMMUNITY): Payer: Self-pay | Admitting: Emergency Medicine

## 2016-04-02 ENCOUNTER — Emergency Department (HOSPITAL_COMMUNITY): Payer: Medicare Other

## 2016-04-02 ENCOUNTER — Inpatient Hospital Stay (HOSPITAL_COMMUNITY)
Admission: EM | Admit: 2016-04-02 | Discharge: 2016-04-08 | DRG: 291 | Disposition: A | Discharge status: Home / Self Care | Payer: Medicare Other | Attending: Internal Medicine | Admitting: Internal Medicine

## 2016-04-02 DIAGNOSIS — K429 Umbilical hernia without obstruction or gangrene: Secondary | ICD-10-CM | POA: Diagnosis not present

## 2016-04-02 DIAGNOSIS — Z79899 Other long term (current) drug therapy: Secondary | ICD-10-CM | POA: Diagnosis not present

## 2016-04-02 DIAGNOSIS — G4733 Obstructive sleep apnea (adult) (pediatric): Secondary | ICD-10-CM | POA: Diagnosis present

## 2016-04-02 DIAGNOSIS — Z808 Family history of malignant neoplasm of other organs or systems: Secondary | ICD-10-CM | POA: Diagnosis not present

## 2016-04-02 DIAGNOSIS — J189 Pneumonia, unspecified organism: Secondary | ICD-10-CM | POA: Diagnosis not present

## 2016-04-02 DIAGNOSIS — J9610 Chronic respiratory failure, unspecified whether with hypoxia or hypercapnia: Secondary | ICD-10-CM | POA: Diagnosis present

## 2016-04-02 DIAGNOSIS — Z7951 Long term (current) use of inhaled steroids: Secondary | ICD-10-CM

## 2016-04-02 DIAGNOSIS — Z800 Family history of malignant neoplasm of digestive organs: Secondary | ICD-10-CM

## 2016-04-02 DIAGNOSIS — J181 Lobar pneumonia, unspecified organism: Secondary | ICD-10-CM | POA: Diagnosis present

## 2016-04-02 DIAGNOSIS — Z9981 Dependence on supplemental oxygen: Secondary | ICD-10-CM | POA: Diagnosis not present

## 2016-04-02 DIAGNOSIS — D594 Other nonautoimmune hemolytic anemias: Secondary | ICD-10-CM | POA: Diagnosis present

## 2016-04-02 DIAGNOSIS — F329 Major depressive disorder, single episode, unspecified: Secondary | ICD-10-CM | POA: Diagnosis present

## 2016-04-02 DIAGNOSIS — Z7901 Long term (current) use of anticoagulants: Secondary | ICD-10-CM | POA: Diagnosis not present

## 2016-04-02 DIAGNOSIS — R05 Cough: Secondary | ICD-10-CM | POA: Diagnosis not present

## 2016-04-02 DIAGNOSIS — I5032 Chronic diastolic (congestive) heart failure: Secondary | ICD-10-CM | POA: Diagnosis present

## 2016-04-02 DIAGNOSIS — I5033 Acute on chronic diastolic (congestive) heart failure: Secondary | ICD-10-CM | POA: Diagnosis present

## 2016-04-02 DIAGNOSIS — I10 Essential (primary) hypertension: Secondary | ICD-10-CM | POA: Diagnosis not present

## 2016-04-02 DIAGNOSIS — M810 Age-related osteoporosis without current pathological fracture: Secondary | ICD-10-CM | POA: Diagnosis present

## 2016-04-02 DIAGNOSIS — D649 Anemia, unspecified: Secondary | ICD-10-CM | POA: Diagnosis not present

## 2016-04-02 DIAGNOSIS — I509 Heart failure, unspecified: Secondary | ICD-10-CM | POA: Diagnosis not present

## 2016-04-02 DIAGNOSIS — M1990 Unspecified osteoarthritis, unspecified site: Secondary | ICD-10-CM | POA: Diagnosis present

## 2016-04-02 DIAGNOSIS — Z801 Family history of malignant neoplasm of trachea, bronchus and lung: Secondary | ICD-10-CM

## 2016-04-02 DIAGNOSIS — I480 Paroxysmal atrial fibrillation: Secondary | ICD-10-CM | POA: Diagnosis present

## 2016-04-02 DIAGNOSIS — I272 Pulmonary hypertension, unspecified: Secondary | ICD-10-CM | POA: Diagnosis present

## 2016-04-02 DIAGNOSIS — I110 Hypertensive heart disease with heart failure: Secondary | ICD-10-CM | POA: Diagnosis not present

## 2016-04-02 DIAGNOSIS — Z825 Family history of asthma and other chronic lower respiratory diseases: Secondary | ICD-10-CM

## 2016-04-02 DIAGNOSIS — Z91013 Allergy to seafood: Secondary | ICD-10-CM

## 2016-04-02 DIAGNOSIS — Z803 Family history of malignant neoplasm of breast: Secondary | ICD-10-CM | POA: Diagnosis not present

## 2016-04-02 DIAGNOSIS — Z881 Allergy status to other antibiotic agents status: Secondary | ICD-10-CM

## 2016-04-02 DIAGNOSIS — Z7984 Long term (current) use of oral hypoglycemic drugs: Secondary | ICD-10-CM

## 2016-04-02 DIAGNOSIS — I4891 Unspecified atrial fibrillation: Secondary | ICD-10-CM | POA: Diagnosis present

## 2016-04-02 DIAGNOSIS — J9611 Chronic respiratory failure with hypoxia: Secondary | ICD-10-CM | POA: Diagnosis not present

## 2016-04-02 DIAGNOSIS — J90 Pleural effusion, not elsewhere classified: Secondary | ICD-10-CM

## 2016-04-02 DIAGNOSIS — D589 Hereditary hemolytic anemia, unspecified: Secondary | ICD-10-CM | POA: Diagnosis present

## 2016-04-02 DIAGNOSIS — J45909 Unspecified asthma, uncomplicated: Secondary | ICD-10-CM | POA: Diagnosis present

## 2016-04-02 DIAGNOSIS — Z8249 Family history of ischemic heart disease and other diseases of the circulatory system: Secondary | ICD-10-CM

## 2016-04-02 DIAGNOSIS — N179 Acute kidney failure, unspecified: Secondary | ICD-10-CM | POA: Diagnosis not present

## 2016-04-02 DIAGNOSIS — M5124 Other intervertebral disc displacement, thoracic region: Secondary | ICD-10-CM | POA: Diagnosis not present

## 2016-04-02 DIAGNOSIS — M5136 Other intervertebral disc degeneration, lumbar region: Secondary | ICD-10-CM | POA: Diagnosis not present

## 2016-04-02 DIAGNOSIS — K219 Gastro-esophageal reflux disease without esophagitis: Secondary | ICD-10-CM | POA: Diagnosis present

## 2016-04-02 DIAGNOSIS — R1084 Generalized abdominal pain: Secondary | ICD-10-CM | POA: Diagnosis not present

## 2016-04-02 DIAGNOSIS — Z8041 Family history of malignant neoplasm of ovary: Secondary | ICD-10-CM

## 2016-04-02 DIAGNOSIS — R262 Difficulty in walking, not elsewhere classified: Secondary | ICD-10-CM

## 2016-04-02 DIAGNOSIS — M549 Dorsalgia, unspecified: Secondary | ICD-10-CM

## 2016-04-02 DIAGNOSIS — R0602 Shortness of breath: Secondary | ICD-10-CM | POA: Diagnosis not present

## 2016-04-02 DIAGNOSIS — M545 Low back pain: Secondary | ICD-10-CM | POA: Diagnosis present

## 2016-04-02 DIAGNOSIS — Z888 Allergy status to other drugs, medicaments and biological substances status: Secondary | ICD-10-CM

## 2016-04-02 LAB — COMPREHENSIVE METABOLIC PANEL
ALK PHOS: 83 U/L (ref 38–126)
ALT: 16 U/L (ref 14–54)
AST: 20 U/L (ref 15–41)
Albumin: 3.5 g/dL (ref 3.5–5.0)
Anion gap: 6 (ref 5–15)
BILIRUBIN TOTAL: 1.7 mg/dL — AB (ref 0.3–1.2)
BUN: 27 mg/dL — AB (ref 6–20)
CALCIUM: 8.7 mg/dL — AB (ref 8.9–10.3)
CO2: 26 mmol/L (ref 22–32)
CREATININE: 1.1 mg/dL — AB (ref 0.44–1.00)
Chloride: 102 mmol/L (ref 101–111)
GFR, EST AFRICAN AMERICAN: 59 mL/min — AB (ref >60)
GFR, EST NON AFRICAN AMERICAN: 51 mL/min — AB (ref >60)
Glucose, Bld: 94 mg/dL (ref 65–99)
Potassium: 4.2 mmol/L (ref 3.5–5.1)
Sodium: 134 mmol/L — ABNORMAL LOW (ref 135–145)
TOTAL PROTEIN: 7.1 g/dL (ref 6.5–8.1)

## 2016-04-02 LAB — CBC WITH DIFFERENTIAL/PLATELET
BASOS PCT: 0 %
Basophils Absolute: 0 10*3/uL (ref 0.0–0.1)
EOS ABS: 0.2 10*3/uL (ref 0.0–0.7)
EOS PCT: 1 %
HCT: 22.9 % — ABNORMAL LOW (ref 36.0–46.0)
HEMOGLOBIN: 7.4 g/dL — AB (ref 12.0–15.0)
LYMPHS PCT: 10 %
Lymphs Abs: 1.8 10*3/uL (ref 0.7–4.0)
MCH: 35.9 pg — AB (ref 26.0–34.0)
MCHC: 32.3 g/dL (ref 30.0–36.0)
MCV: 111.2 fL — AB (ref 78.0–100.0)
Monocytes Absolute: 2.3 10*3/uL — ABNORMAL HIGH (ref 0.1–1.0)
Monocytes Relative: 13 %
NEUTROS ABS: 13.3 10*3/uL — AB (ref 1.7–7.7)
NEUTROS PCT: 76 %
Platelets: 405 10*3/uL — ABNORMAL HIGH (ref 150–400)
RBC: 2.06 MIL/uL — ABNORMAL LOW (ref 3.87–5.11)
RDW: 16.2 % — ABNORMAL HIGH (ref 11.5–15.5)
WBC: 17.6 10*3/uL — ABNORMAL HIGH (ref 4.0–10.5)

## 2016-04-02 LAB — URINALYSIS, ROUTINE W REFLEX MICROSCOPIC
BILIRUBIN URINE: NEGATIVE
GLUCOSE, UA: NEGATIVE mg/dL
HGB URINE DIPSTICK: NEGATIVE
KETONES UR: NEGATIVE mg/dL
Leukocytes, UA: NEGATIVE
Nitrite: NEGATIVE
PROTEIN: NEGATIVE mg/dL
Specific Gravity, Urine: 1.017 (ref 1.005–1.030)
pH: 5.5 (ref 5.0–8.0)

## 2016-04-02 LAB — BRAIN NATRIURETIC PEPTIDE: B Natriuretic Peptide: 806.9 pg/mL — ABNORMAL HIGH (ref 0.0–100.0)

## 2016-04-02 LAB — STREP PNEUMONIAE URINARY ANTIGEN: Strep Pneumo Urinary Antigen: NEGATIVE

## 2016-04-02 LAB — LIPASE, BLOOD: LIPASE: 23 U/L (ref 11–51)

## 2016-04-02 LAB — AMMONIA: Ammonia: 23 umol/L (ref 9–35)

## 2016-04-02 MED ORDER — ACETAMINOPHEN 325 MG PO TABS: 650.0000 mg | ORAL_TABLET | ORAL | Status: DC | PRN

## 2016-04-02 MED ORDER — POTASSIUM CHLORIDE CRYS ER 20 MEQ PO TBCR: 60.0000 meq | EXTENDED_RELEASE_TABLET | Freq: Every day | ORAL | Status: DC

## 2016-04-02 MED ORDER — DILTIAZEM HCL ER COATED BEADS 240 MG PO CP24
240.0000 mg | ORAL_CAPSULE | Freq: Every day | ORAL | Status: DC
  Administered 2016-04-03 – 2016-04-08 (×6): 240 mg via ORAL
  Filled 2016-04-02 (×6): qty 1

## 2016-04-02 MED ORDER — SODIUM CHLORIDE 0.9 % IV SOLN: 250.0000 mL | INTRAVENOUS | Status: DC | PRN

## 2016-04-02 MED ORDER — SODIUM CHLORIDE 0.9% FLUSH
3.0000 mL | Freq: Two times a day (BID) | INTRAVENOUS | Status: DC
  Administered 2016-04-03 – 2016-04-08 (×11): 3 mL via INTRAVENOUS

## 2016-04-02 MED ORDER — AIRBORNE PO CHEW: 1.0000 | CHEWABLE_TABLET | Freq: Two times a day (BID) | ORAL | Status: DC

## 2016-04-02 MED ORDER — BISOPROLOL FUMARATE 5 MG PO TABS
5.0000 mg | ORAL_TABLET | Freq: Two times a day (BID) | ORAL | Status: DC
  Administered 2016-04-02 – 2016-04-08 (×12): 5 mg via ORAL
  Filled 2016-04-02 (×12): qty 1

## 2016-04-02 MED ORDER — MORPHINE SULFATE (PF) 2 MG/ML IV SOLN
2.0000 mg | INTRAVENOUS | Status: DC | PRN
  Administered 2016-04-02 – 2016-04-03 (×7): 2 mg via INTRAVENOUS
  Filled 2016-04-02 (×7): qty 1

## 2016-04-02 MED ORDER — FLUTICASONE PROPIONATE 50 MCG/ACT NA SUSP
1.0000 | Freq: Every day | NASAL | Status: DC
Start: 2016-04-03 — End: 2016-04-08
  Administered 2016-04-03 – 2016-04-08 (×6): 1 via NASAL
  Filled 2016-04-02: qty 16

## 2016-04-02 MED ORDER — SODIUM CHLORIDE 0.9 % IV BOLUS (SEPSIS)
500.0000 mL | Freq: Once | INTRAVENOUS | Status: AC
  Administered 2016-04-02: 500 mL via INTRAVENOUS

## 2016-04-02 MED ORDER — RISAQUAD PO CAPS
1.0000 | ORAL_CAPSULE | Freq: Every day | ORAL | Status: DC
  Administered 2016-04-03 – 2016-04-08 (×6): 1 via ORAL
  Filled 2016-04-02 (×6): qty 1

## 2016-04-02 MED ORDER — DEXTROSE 5 % IV SOLN
500.0000 mg | Freq: Once | INTRAVENOUS | Status: DC
  Administered 2016-04-02: 500 mg via INTRAVENOUS
  Filled 2016-04-02: qty 500

## 2016-04-02 MED ORDER — LORATADINE 10 MG PO TABS
10.0000 mg | ORAL_TABLET | Freq: Every day | ORAL | Status: DC
  Administered 2016-04-03 – 2016-04-08 (×6): 10 mg via ORAL
  Filled 2016-04-02 (×6): qty 1

## 2016-04-02 MED ORDER — DEXTROSE 5 % IV SOLN
1.0000 g | INTRAVENOUS | Status: DC
  Administered 2016-04-03 – 2016-04-05 (×3): 1 g via INTRAVENOUS
  Filled 2016-04-02 (×3): qty 10

## 2016-04-02 MED ORDER — CALCIUM CARBONATE-VITAMIN D 500-200 MG-UNIT PO TABS
1.0000 | ORAL_TABLET | Freq: Two times a day (BID) | ORAL | Status: DC
  Administered 2016-04-02 – 2016-04-08 (×12): 1 via ORAL
  Filled 2016-04-02 (×12): qty 1

## 2016-04-02 MED ORDER — ONDANSETRON HCL 4 MG/2ML IJ SOLN: 4.0000 mg | Freq: Four times a day (QID) | INTRAMUSCULAR | Status: DC | PRN

## 2016-04-02 MED ORDER — GUAIFENESIN-DM 100-10 MG/5ML PO SYRP
5.0000 mL | ORAL_SOLUTION | ORAL | Status: DC | PRN
  Administered 2016-04-04 – 2016-04-06 (×7): 5 mL via ORAL
  Filled 2016-04-02 (×8): qty 10

## 2016-04-02 MED ORDER — ESCITALOPRAM OXALATE 10 MG PO TABS
10.0000 mg | ORAL_TABLET | Freq: Every day | ORAL | Status: DC
  Administered 2016-04-02 – 2016-04-07 (×6): 10 mg via ORAL
  Filled 2016-04-02 (×6): qty 1

## 2016-04-02 MED ORDER — DEXTROSE 5 % IV SOLN
500.0000 mg | Freq: Once | INTRAVENOUS | Status: DC
  Filled 2016-04-02: qty 500

## 2016-04-02 MED ORDER — SENNOSIDES-DOCUSATE SODIUM 8.6-50 MG PO TABS
1.0000 | ORAL_TABLET | Freq: Every evening | ORAL | Status: DC | PRN
  Administered 2016-04-05 – 2016-04-06 (×2): 1 via ORAL
  Filled 2016-04-02 (×2): qty 1

## 2016-04-02 MED ORDER — PANTOPRAZOLE SODIUM 40 MG PO TBEC
40.0000 mg | DELAYED_RELEASE_TABLET | Freq: Every day | ORAL | Status: DC
  Administered 2016-04-03 – 2016-04-08 (×6): 40 mg via ORAL
  Filled 2016-04-02 (×6): qty 1

## 2016-04-02 MED ORDER — ALBUTEROL SULFATE HFA 108 (90 BASE) MCG/ACT IN AERS: 2.0000 | INHALATION_SPRAY | Freq: Four times a day (QID) | RESPIRATORY_TRACT | Status: DC | PRN

## 2016-04-02 MED ORDER — MORPHINE SULFATE (PF) 2 MG/ML IV SOLN
4.0000 mg | Freq: Once | INTRAVENOUS | Status: AC
  Administered 2016-04-02: 4 mg via INTRAVENOUS
  Filled 2016-04-02: qty 2

## 2016-04-02 MED ORDER — AZITHROMYCIN 250 MG PO TABS
500.0000 mg | ORAL_TABLET | ORAL | Status: DC
  Administered 2016-04-03 – 2016-04-07 (×5): 500 mg via ORAL
  Filled 2016-04-02 (×6): qty 2

## 2016-04-02 MED ORDER — DEXTROSE 5 % IV SOLN
1.0000 g | Freq: Once | INTRAVENOUS | Status: AC
  Administered 2016-04-02: 1 g via INTRAVENOUS
  Filled 2016-04-02: qty 10

## 2016-04-02 MED ORDER — ALBUTEROL SULFATE (2.5 MG/3ML) 0.083% IN NEBU: 2.5000 mg | INHALATION_SOLUTION | Freq: Four times a day (QID) | RESPIRATORY_TRACT | Status: DC | PRN

## 2016-04-02 MED ORDER — SALINE SPRAY 0.65 % NA SOLN
1.0000 | Freq: Three times a day (TID) | NASAL | Status: DC | PRN
  Administered 2016-04-03: 1 via NASAL
  Filled 2016-04-02: qty 44

## 2016-04-02 MED ORDER — FUROSEMIDE 10 MG/ML IJ SOLN
60.0000 mg | Freq: Two times a day (BID) | INTRAMUSCULAR | Status: DC
  Administered 2016-04-02 – 2016-04-04 (×4): 60 mg via INTRAVENOUS
  Filled 2016-04-02 (×4): qty 6

## 2016-04-02 MED ORDER — CLONAZEPAM 0.5 MG PO TABS
1.0000 mg | ORAL_TABLET | Freq: Every day | ORAL | Status: DC
  Administered 2016-04-02 – 2016-04-07 (×6): 1 mg via ORAL
  Filled 2016-04-02 (×6): qty 2

## 2016-04-02 MED ORDER — VITAMIN D3 25 MCG (1000 UNIT) PO TABS
1000.0000 [IU] | ORAL_TABLET | Freq: Two times a day (BID) | ORAL | Status: DC
  Administered 2016-04-02 – 2016-04-08 (×12): 1000 [IU] via ORAL
  Filled 2016-04-02 (×12): qty 1

## 2016-04-02 MED ORDER — ONDANSETRON HCL 4 MG/2ML IJ SOLN
4.0000 mg | Freq: Once | INTRAMUSCULAR | Status: AC
  Administered 2016-04-02: 4 mg via INTRAVENOUS
  Filled 2016-04-02: qty 2

## 2016-04-02 MED ORDER — DEFERASIROX 500 MG PO TBSO: 1500.0000 mg | ORAL_TABLET | Freq: Every day | ORAL | Status: DC

## 2016-04-02 MED ORDER — APIXABAN 5 MG PO TABS
5.0000 mg | ORAL_TABLET | Freq: Two times a day (BID) | ORAL | Status: DC
  Administered 2016-04-02 – 2016-04-08 (×12): 5 mg via ORAL
  Filled 2016-04-02 (×12): qty 1

## 2016-04-02 MED ORDER — FOLIC ACID 1 MG PO TABS
1.0000 mg | ORAL_TABLET | Freq: Every day | ORAL | Status: DC
  Administered 2016-04-03 – 2016-04-08 (×6): 1 mg via ORAL
  Filled 2016-04-02 (×7): qty 1

## 2016-04-02 MED ORDER — SODIUM CHLORIDE 0.9% FLUSH: 3.0000 mL | INTRAVENOUS | Status: DC | PRN

## 2016-04-02 MED ORDER — RALOXIFENE HCL 60 MG PO TABS
60.0000 mg | ORAL_TABLET | Freq: Every day | ORAL | Status: DC
  Administered 2016-04-03 – 2016-04-08 (×6): 60 mg via ORAL
  Filled 2016-04-02 (×6): qty 1

## 2016-04-02 NOTE — ED Notes (Signed)
Hospitalist at bedside 

## 2016-04-02 NOTE — H&P (Signed)
TRH H&P   Patient Demographics:    [name], is a 65 y.o. female  MRN: [id number]   DOB - [birth year]  Admit Date - 04/02/2016  Outpatient Primary MD for the patient is Henrine Screws, MD  Referring MD: Dr. Lita Mains  Outpatient Specialists: Dr [name](Hematology) Dr Haroldine Laws (Heart failure)  Dr Lamonte Sakai (pulmonary)  Patient coming from: home  Chief Complaint  Patient presents with  . Back Pain      HPI:    [name]  is a 65 y.o. female, With history of chronic diastolic CHF, asthma, pulmonary hypertension with chronic respiratory failure on 2 L home O2, A. fib status post RFA and Maze procedure, on anticoagulation, pyruvate kinase deficiency hemolytic anemia, mitral regurgitation status post repair who was brought to the ED with family with one week off mid to lower left back pain. This was acute and over the next few days became progressive having difficulty ambulating. She was also having some cough with yellowish phlegm and some wheezing. She stopped taking her Lasix as she had difficulty getting out of bed to use the bathroom. For past 2 days she started having diffuse abdominal spasms, 10/10 in severity which seemed like it was radiating from the back. She denied any trauma or fall. She denied any nausea, vomiting, diarrhea, dysuria, fevers or chills. She denied any chest pain but reported some dyspnea on exertion. Informed having constipation for 3 days until today . Denied any palpitations, headache, blurred vision, numbness of extremities. Denies any new medications. (Was treated with prednisone and Augmentin about 2 weeks back for URI symptoms by her pulmonologist). She denied recent travel or sick contact. Family noticed that she was slightly confused this morning.  Course in the ED Vitals were stable. Blood work showed WBC of 17.6 K, hemoglobin of 7.4  (around baseline), platelets of 405. Sodium of 134, potassium 4.2, BUN of 27 and creatinine 1.10. Lactic acid, LFTs and lipase were normal. BNP was elevated to 800s. In the ED a portable chest x-ray done showed mild CHF with prominent left hilar area. This was followed by CT of the chest abdomen and pelvis. Showed masslike area with airspace consolidation in the superior segment of the left lower lobe with small parapneumonic pleural effusion. Also showed cardiomegaly with mild interstitial pulmonary edema, multiple enlarged mediastinal lymph node (were seen on remote CT scan from 2011). Signs of acute abdomen or intra-abdominal abnormalities or hydronephrosis noted.  Patient placed on IV Rocephin and azithromycin, given IV morphine with some relief to her abdominal pain.   Review of systems:    In addition to the HPI above, No Fever-chills, No Headache, No changes with Vision or hearing, No problems swallowing food or Liquids, No Chest pain, Cough or Shortness of Breath+, Diffuse Abdominal pain, No Nausea or Vommitting, constipation No Blood in stool or Urine, No dysuria, No new skin  rashes or bruises, No new joints pains-aches, left lower back pain No new weakness, tingling, numbness in any extremity, No recent weight gain or loss, No polyuria, polydypsia or polyphagia, No significant Mental Stressors.  A full 10 point Review of Systems was done, except as stated above, all other Review of Systems were negative.   With Past History of the following :    Past Medical History:  Diagnosis Date  . Allergic rhinitis   . Anemia   . Asthma   . Asthma   . Atrial fibrillation ([place])    s/p RFA and Maze procedure - resolved  . Atrial tachycardia ([place])   . Depression   . Dyspnea   . Exercise hypoxemia    supplemental O2 PRN  . Fatigue   . Gastritis    NSAID induced  . GERD (gastroesophageal reflux disease)   . Heart murmur    severe MR s/p MV repair  . Hx of asplenia     surgical  . Hypertension   . 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 Myoclonus   . Osteoporosis   . Pulmonary HTN    mild to moderate by [place] 05/2012  . Pyruvate kinase deficiency hemolytic anemia ([place])   . Syncope and collapse 2012   r/t anemia  . Trochanteric bursitis    left hip      Past Surgical History:  Procedure Laterality Date  . ABLATION     afib  . CARDIAC CATHETERIZATION N/A 10/29/2014   Procedure: Right Heart Cath;  Surgeon: Larey Dresser, MD;  Location: [facility];  Service: Cardiovascular;  Laterality: N/A;  . CARDIOVERSION N/A 09/11/2013   Procedure: CARDIOVERSION;  Surgeon: Jolaine Artist, MD;  Location: [facility] ENDOSCOPY;  Service: Cardiovascular;  Laterality: N/A;  . MAZE     with MV repair  . MITRAL VALVE REPAIR  01/2010   with MAZE  . myocardial window  2009   s/p afib ablation  . TEE WITHOUT CARDIOVERSION N/A 09/11/2013   Procedure: TRANSESOPHAGEAL ECHOCARDIOGRAM (TEE);  Surgeon: Jolaine Artist, MD;  Location: [facility] ENDOSCOPY;  Service: Cardiovascular;  Laterality: N/A;      Social History:     Social History  Substance Use Topics  . Smoking status: Never Smoker  . Smokeless tobacco: Never Used  . Alcohol use Yes     Comment: socially glass red wine monthly     Lives - With husband  Mobility - ambulatory     Family History :     Family History  Problem Relation Age of Onset  . Breast cancer Mother 45    ER+  . Melanoma Father 59  . Adrenal disorder Sister     pheochromacytoma - rt. adrenal gland  . COPD Maternal Aunt   . Stomach cancer Paternal Uncle     onset in 46s  . Lung cancer Maternal Grandmother     died late 46s, heavy smoker  . Heart disease Maternal Grandfather     hardening of the arteries, poor circulation  . Ovarian cancer Sister 25    stage IV, now 56      Home Medications:   Prior to Admission medications   Medication Sig Start Date End Date Taking? Authorizing  Provider  albuterol (PROVENTIL HFA;VENTOLIN HFA) 108 (90 Base) MCG/ACT inhaler Inhale 2 puffs into the lungs every 6 (six) hours as needed for wheezing or shortness of breath. 03/14/16  Yes Marijean Heath, NP  apixaban (ELIQUIS) 5 MG TABS tablet Take 1 tablet (5 mg total) by mouth 2 (two) times daily. 02/14/16  Yes Larey Dresser, MD  bisoprolol (ZEBETA) 5 MG tablet TAKE 1 TABLET(5 MG) BY MOUTH TWICE DAILY 02/14/16  Yes Larey Dresser, MD  BREO ELLIPTA 100-25 MCG/INH AEPB INHALE 1 PUFF BY MOUTH EVERY DAY 02/07/16  Yes Collene Gobble, MD  calcium-vitamin D (OSCAL WITH D) 500-200 MG-UNIT tablet Take 1 tablet by mouth 2 (two) times daily.   Yes Historical Provider, MD  CARTIA XT 240 MG 24 hr capsule TAKE 1 CAPSULE BY MOUTH DAILY 02/22/16  Yes Jolaine Artist, MD  CHLORPHENIRAMINE MALEATE PO Take 4 mg by mouth at bedtime.   Yes Historical Provider, MD  cholecalciferol (VITAMIN D) 1000 UNITS tablet Take 1,000 Units by mouth 2 (two) times daily.    Yes Historical Provider, MD  clonazePAM (KLONOPIN) 1 MG tablet Take 1-2 mg by mouth at bedtime.    Yes Historical Provider, MD  deferasirox (EXJADE) 500 MG disintegrating tablet Take 3 tablets (1,500 mg total) by mouth daily before breakfast. Faxed to [facility] on 02/17/16 03/30/16  Yes Ladell Pier, MD  EPINEPHrine (EPI-PEN) 0.3 mg/0.3 mL DEVI Inject 0.3 mg into the muscle once as needed.   Yes Historical Provider, MD  escitalopram (LEXAPRO) 10 MG tablet Take 10 mg by mouth daily.  03/03/14  Yes Historical Provider, MD  fexofenadine (ALLEGRA) 180 MG tablet Take 180 mg by mouth daily.    Yes Historical Provider, MD  fluticasone (FLONASE) 50 MCG/ACT nasal spray Place 1 spray into both nostrils daily.   Yes Historical Provider, MD  folic acid (FOLVITE) 1 MG tablet Take 1 mg by mouth daily.   Yes Historical Provider, MD  Multiple Vitamins-Minerals (AIRBORNE) CHEW Chew 1 tablet by mouth 2 (two) times daily.    Yes Historical Provider, MD  NON FORMULARY  Inhale 2 L into the lungs continuous. Oxygen at 2 l/min [place]   Yes Historical Provider, MD  pantoprazole (PROTONIX) 40 MG tablet Take 40 mg by mouth daily.   Yes Historical Provider, MD  potassium chloride SA (K-DUR,KLOR-CON) 20 MEQ tablet TAKE 3 TABLETS(60 MEQ) BY MOUTH TWICE DAILY 01/10/16  Yes Larey Dresser, MD  Probiotic Product (ALIGN) 4 MG CAPS Take 1 tablet by mouth daily.   Yes Historical Provider, MD  raloxifene (EVISTA) 60 MG tablet Take 60 mg by mouth daily.   Yes Historical Provider, MD  sodium chloride (OCEAN) 0.65 % SOLN nasal spray Place 1 spray into both nostrils 3 (three) times daily as needed for congestion.   Yes Historical Provider, MD  spironolactone (ALDACTONE) 25 MG tablet TAKE 1/2 TABLET(12.5 MG) BY MOUTH DAILY 01/25/16  Yes Larey Dresser, MD  torsemide (DEMADEX) 20 MG tablet TAKE 4 TABLETS BY MOUTH DAILY 11/22/15  Yes Jolaine Artist, MD  traMADol (ULTRAM) 50 MG tablet Take 50 mg by mouth every 6 (six) hours as needed for moderate pain. 3-4 times daily   Yes Historical Provider, MD  [place] Name: CPAP   Yes Historical Provider, MD  amoxicillin-clavulanate (AUGMENTIN) 875-125 MG tablet Take 1 tablet by mouth 2 (two) times daily. Patient not taking: Reported on 04/02/2016 03/14/16   Marijean Heath, NP  oxyCODONE-acetaminophen (PERCOCET/ROXICET) 5-325 MG tablet Take 1 tablet by mouth every 8 (eight) hours as needed for severe pain. Patient not taking: Reported on 04/02/2016 11/24/15   Varney Biles, MD  predniSONE (DELTASONE) 10 MG tablet 4 tabs po  daily x 2 days then 3 po daily x 2 days then 2 po daily x 2 days then 1 tab po daily x 2 days then STOP Patient not taking: Reported on 04/02/2016 03/14/16   Marijean Heath, NP     Allergies:     Allergies  Allergen Reactions  . Iodine Anaphylaxis  . Shellfish Allergy Anaphylaxis  . Iohexol      Code: HIVES, Desc: reaction since childhood, Onset Date: [id number]   . Levaquin [Levofloxacin In D5w] Nausea  And Vomiting    dizziness     Physical Exam:   Vitals  Blood pressure 130/76, pulse 70, temperature 98.2 F (36.8 C), temperature source Oral, resp. rate 22, SpO2 93 %.   Elderly female lying in bed not in distress HEENT: No pallor, moist mucosa, supple neck, mild JVD Chest: Fine bibasilar crackles (more prominent over left lung base), no rhonchi or wheeze CVS: Normal S1 and S2, no murmurs rub or gallop GI: Soft, mild distention, right upper quadrant tenderness to palpation, bowel sounds present Musculoskeletal: Warm, 1+ pitting edema bilaterally CNS: Alert and oriented   Data Review:    CBC  Recent Labs Lab 04/02/16 1446  WBC 17.6*  HGB 7.4*  HCT 22.9*  PLT 405*  MCV 111.2*  MCH 35.9*  MCHC 32.3  RDW 16.2*  LYMPHSABS 1.8  MONOABS 2.3*  EOSABS 0.2  BASOSABS 0.0   ------------------------------------------------------------------------------------------------------------------  Chemistries   Recent Labs Lab 04/02/16 1446  NA 134*  K 4.2  CL 102  CO2 26  GLUCOSE 94  BUN 27*  CREATININE 1.10*  CALCIUM 8.7*  AST 20  ALT 16  ALKPHOS 83  BILITOT 1.7*   ------------------------------------------------------------------------------------------------------------------ CrCl cannot be calculated (Unknown ideal weight.). ------------------------------------------------------------------------------------------------------------------ No results for input(s): TSH, T4TOTAL, T3FREE, THYROIDAB in the last 72 hours.  Invalid input(s): FREET3  Coagulation profile No results for input(s): INR, PROTIME in the last 168 hours. ------------------------------------------------------------------------------------------------------------------- No results for input(s): DDIMER in the last 72 hours. -------------------------------------------------------------------------------------------------------------------  Cardiac Enzymes No results for input(s): CKMB, TROPONINI,  MYOGLOBIN in the last 168 hours.  Invalid input(s): CK ------------------------------------------------------------------------------------------------------------------    Component Value Date/Time   BNP 806.9 (H) 04/02/2016 1447     ---------------------------------------------------------------------------------------------------------------  Urinalysis    Component Value Date/Time   COLORURINE YELLOW 04/02/2016 1624   APPEARANCEUR CLEAR 04/02/2016 1624   LABSPEC 1.017 04/02/2016 1624   LABSPEC 1.015 04/27/2010 1236   PHURINE 5.5 04/02/2016 1624   GLUCOSEU NEGATIVE 04/02/2016 1624   HGBUR NEGATIVE 04/02/2016 1624   BILIRUBINUR NEGATIVE 04/02/2016 1624   BILIRUBINUR Negative 04/27/2010 1236   KETONESUR NEGATIVE 04/02/2016 1624   PROTEINUR NEGATIVE 04/02/2016 1624   UROBILINOGEN 1.0 09/08/2013 1323   NITRITE NEGATIVE 04/02/2016 1624   LEUKOCYTESUR NEGATIVE 04/02/2016 1624   LEUKOCYTESUR Negative 04/27/2010 1236    ----------------------------------------------------------------------------------------------------------------   Imaging Results:    Ct Abdomen Pelvis Wo Contrast  Result Date: 04/02/2016 CLINICAL DATA:  65 year old female with history of shortness of breath, abdominal pain and low back pain. EXAM: CT CHEST, ABDOMEN AND PELVIS WITHOUT CONTRAST TECHNIQUE: Multidetector CT imaging of the chest, abdomen and pelvis was performed following the standard protocol without IV contrast. COMPARISON:  None. FINDINGS: CT CHEST FINDINGS Cardiovascular: Heart size is enlarged with biatrial dilatation. Status post mitral annuloplasty. There is no significant pericardial fluid, thickening or pericardial calcification. Mediastinum/Nodes: Several borderline enlarged and mildly enlarged mediastinal lymph nodes are noted, measuring up to 12 mm in short axis in the prevascular nodal stations. Esophagus is unremarkable  in appearance. No axillary lymphadenopathy. Lungs/Pleura: Diffuse  interlobular septal thickening and mild ground-glass attenuation, suggesting a background of mild interstitial pulmonary edema. There is a more focal area of mass-like airspace consolidation in the superior segment of the left lower lobe, concerning for pneumonia. Small left pleural effusion lying dependently. Musculoskeletal: There are no aggressive appearing lytic or blastic lesions noted in the visualized portions of the skeleton. CT ABDOMEN PELVIS FINDINGS Hepatobiliary: Diffuse low attenuation throughout the hepatic parenchyma, compatible with a background of hepatic steatosis. No definite cystic or solid hepatic lesions are identified on today's noncontrast CT examination. Status post cholecystectomy. Pancreas: No definite pancreatic mass or peripancreatic inflammatory changes on today's noncontrast CT examination. 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 Adrenals/Urinary Tract: Unenhanced appearance of the kidneys and bilateral adrenal glands is normal. No hydroureteronephrosis. Urinary bladder is normal in appearance. Stomach/Bowel: Unenhanced appearance of the stomach is normal. There is no pathologic dilatation of small bowel or colon. The appendix is not confidently identified and may be surgically absent. Regardless, there are no inflammatory changes noted adjacent to the cecum to suggest the presence of an acute appendicitis at this time. Vascular/Lymphatic: Calcified atherosclerotic plaque in the abdominal aorta and pelvic vasculature, without definite aneurysm. No lymphadenopathy is noted in the abdomen or pelvis on today's noncontrast CT examination. Reproductive: Uterus and ovaries are atrophic. Other: Umbilical hernia containing only omental fat. No significant volume of ascites. No pneumoperitoneum. Musculoskeletal: There are no aggressive appearing lytic or blastic lesions noted in the visualized portions of the skeleton. IMPRESSION: 1. Mass-like area of apparent airspace  consolidation centered in the superior segment of the left lower lobe with small left parapneumonic pleural effusion. Followup PA and lateral chest radiographs are recommended within the next 2-3 weeks to ensure resolution of this finding, as underlying neoplasm is not excluded. 2. Cardiomegaly with biatrial dilatation, and evidence of mild interstitial pulmonary edema. These findings suggest mild congestive heart failure. 3. Multiple enlarged mediastinal lymph nodes, most evident in the prevascular nodal stations. In the setting of presumed congestive heart failure, these findings are nonspecific, but favored to be benign given that prominent lymph nodes in these regions were also noted on remote prior examination 09/07/2009. No other lymphadenopathy is noted elsewhere in the abdomen or pelvis. Should the apparent airspace consolidation in the superior segment of the left lower lobe fail to resolve, the possibility of underlying primary bronchogenic malignancy with associated mediastinal lymphadenopathy should be considered. 4. Aortic atherosclerosis. 5. Hepatic steatosis. 6. Umbilical hernia containing only omental fat incidentally noted. 7. Additional incidental findings, as above. Electronically Signed   By: Vinnie Langton M.D.   On: 04/02/2016 16:59   Ct Chest Wo Contrast  Result Date: 04/02/2016 CLINICAL DATA:  65 year old female with history of shortness of breath, abdominal pain and low back pain. EXAM: CT CHEST, ABDOMEN AND PELVIS WITHOUT CONTRAST TECHNIQUE: Multidetector CT imaging of the chest, abdomen and pelvis was performed following the standard protocol without IV contrast. COMPARISON:  None. FINDINGS: CT CHEST FINDINGS Cardiovascular: Heart size is enlarged with biatrial dilatation. Status post mitral annuloplasty. There is no significant pericardial fluid, thickening or pericardial calcification. Mediastinum/Nodes: Several borderline enlarged and mildly enlarged mediastinal lymph nodes are  noted, measuring up to 12 mm in short axis in the prevascular nodal stations. Esophagus is unremarkable in appearance. No axillary lymphadenopathy. Lungs/Pleura: Diffuse interlobular septal thickening and mild ground-glass attenuation, suggesting a background of mild interstitial pulmonary edema. There is a more focal area of mass-like airspace  consolidation in the superior segment of the left lower lobe, concerning for pneumonia. Small left pleural effusion lying dependently. Musculoskeletal: There are no aggressive appearing lytic or blastic lesions noted in the visualized portions of the skeleton. CT ABDOMEN PELVIS FINDINGS Hepatobiliary: Diffuse low attenuation throughout the hepatic parenchyma, compatible with a background of hepatic steatosis. No definite cystic or solid hepatic lesions are identified on today's noncontrast CT examination. Status post cholecystectomy. Pancreas: No definite pancreatic mass or peripancreatic inflammatory changes on today's noncontrast CT examination. 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 Adrenals/Urinary Tract: Unenhanced appearance of the kidneys and bilateral adrenal glands is normal. No hydroureteronephrosis. Urinary bladder is normal in appearance. Stomach/Bowel: Unenhanced appearance of the stomach is normal. There is no pathologic dilatation of small bowel or colon. The appendix is not confidently identified and may be surgically absent. Regardless, there are no inflammatory changes noted adjacent to the cecum to suggest the presence of an acute appendicitis at this time. Vascular/Lymphatic: Calcified atherosclerotic plaque in the abdominal aorta and pelvic vasculature, without definite aneurysm. No lymphadenopathy is noted in the abdomen or pelvis on today's noncontrast CT examination. Reproductive: Uterus and ovaries are atrophic. Other: Umbilical hernia containing only omental fat. No significant volume of ascites. No pneumoperitoneum.  Musculoskeletal: There are no aggressive appearing lytic or blastic lesions noted in the visualized portions of the skeleton. IMPRESSION: 1. Mass-like area of apparent airspace consolidation centered in the superior segment of the left lower lobe with small left parapneumonic pleural effusion. Followup PA and lateral chest radiographs are recommended within the next 2-3 weeks to ensure resolution of this finding, as underlying neoplasm is not excluded. 2. Cardiomegaly with biatrial dilatation, and evidence of mild interstitial pulmonary edema. These findings suggest mild congestive heart failure. 3. Multiple enlarged mediastinal lymph nodes, most evident in the prevascular nodal stations. In the setting of presumed congestive heart failure, these findings are nonspecific, but favored to be benign given that prominent lymph nodes in these regions were also noted on remote prior examination 09/07/2009. No other lymphadenopathy is noted elsewhere in the abdomen or pelvis. Should the apparent airspace consolidation in the superior segment of the left lower lobe fail to resolve, the possibility of underlying primary bronchogenic malignancy with associated mediastinal lymphadenopathy should be considered. 4. Aortic atherosclerosis. 5. Hepatic steatosis. 6. Umbilical hernia containing only omental fat incidentally noted. 7. Additional incidental findings, as above. Electronically Signed   By: Vinnie Langton M.D.   On: 04/02/2016 16:59   Dg Chest Port 1 View  Result Date: 04/02/2016 CLINICAL DATA:  Shortness of breath and cough EXAM: PORTABLE CHEST 1 VIEW COMPARISON:  March 14, 2016 FINDINGS: There is moderate interstitial pulmonary edema. There is no airspace consolidation. Heart is mildly enlarged with pulmonary venous hypertension. There is prominence in the left perihilar region. No other areas concerning for potential adenopathy. No bone lesions. IMPRESSION: Evidence a degree of congestive heart failure.  Prominence in the left hilar region, concerning for adenopathy. This finding warrants contrast enhanced chest CT to further assess. Electronically Signed   By: Lowella Grip III M.D.   On: 04/02/2016 15:08    My personal review of EKG: Sinus rhythm at 73, no ST-T changes   Assessment & Plan:    Principal problem Left lobar pneumonia with small parapneumonic effusion Admit to telemetry. Started on empiric Rocephin and azithromycin. Follow blood cultures, sputum culture, urine for strep antigen and Legionella antigen. Supportive care with Tylenol and antitussives. With check repeat chest x-ray in  24 hours to evaluate the effusion. Recommends follow-up PA and lateral chest x-ray in 2-3 weeks to ensure resolution and rule out underlying neoplasm.  Active Problems: Acute on chronic diastolic CHF ([place]) Patient stopped taking her Lasix for a few days due to difficulty ambulating to the bathroom. Will place her on IV Lasix 60 mg twice a day. Strict I/O and daily weight. Resume potassium supplement. Fluid restriction to 1500 mL. Consult heart failure team if unimproved. Continue Imdur and beta blocker. Hold off on echo at present.    Abdominal pain, acute, generalized CT abdomen pelvis unremarkable. Lactic acid, LFTs and lipase normal. Unclear if this is radiation of her pain from left lower lobe of the lung or the back. Continue with when necessary IV morphine for pain control. Added bowel regimen. If worsened or unimproved will plan on repeat imaging.  Left mid-lower back pain. Nontender on exam after receiving morphine in the ED. Pain is lateral. I suspect this is pleuritic. Continue when necessary IV morphine, PT evaluation. If unimproved will need imaging of the thoracic/lumbar spine.     hemolytic anemia   Obstructive sleep apnea Continue nighttime CPAP.    Essential hypertension Stable. Resume home medications    PAF- recurrent, Maze 9/11 Continue beta blocker and  anticoagulation.    Asthma, intrinsic No acute symptoms. Continue albuterol inhaler.    Pulmonary HTN Follows with Dr. Lamonte Sakai. Continue diuresis for now.  Pyruvate kinase deficiency hemolytic anemia Hemoglobin around baseline. Does not need transfusion at present. Follows with Dr. Learta Codding.     DVT Prophylaxis : eliquis  AM Labs Ordered, also please review Full Orders  Family Communication:   Code Status full code  Likely DC to:  home  Condition stable  Consults called: none    Admission status: inpatient   Time spent in minutes : 70   Louellen Molder M.D on 04/02/2016 at 6:02 PM  Between 7am to 7pm - Pager - [phone]. After 7pm go to [email or web address] - password [email or web address]  [facility] - Office  [phone]

## 2016-04-02 NOTE — ED Notes (Signed)
Pt reports pain "a lot better but still hurts when a move up or down." Pt resting with lights dimmed. Aware of pending urine sample.

## 2016-04-02 NOTE — ED Notes (Signed)
Pt was assisted in using a female urinal. Pt was unable to provide a urine sample. She would like to try again in a little bit

## 2016-04-02 NOTE — Progress Notes (Signed)
Pt arrived from ED on stretcher, stand/pivot to bed w/ one assist. VSS. Oriented to callbell and environment. POC discussed. Tele 09 confirmed w/ CCMD x 2. Bed alarm on .

## 2016-04-02 NOTE — ED Notes (Signed)
Pt reports lower back pain worsening over past week; feels like spasms. Pt denies n/v/d or GU symptoms. Pt reports pain worse with palpation and movement. Denies injury.

## 2016-04-02 NOTE — ED Notes (Signed)
[place] at bedside.

## 2016-04-02 NOTE — Telephone Encounter (Signed)
Call from pt's friend requesting to make Dr. Benay Spice aware that pt is being seen in ED at [facility] for abdominal and back pain. Back pain has been ongoing for about 1 week. Pt has had chest Xray done so far.  Will make Dr. Benay Spice aware of call.

## 2016-04-02 NOTE — ED Triage Notes (Signed)
Pt from home report lower back pain. Hx chronic pain. Pt also reports cough and shortness of breath new onset HX  intense pulmonary illness, she sts.  Family reports altered mental status possibly due to high ammonia level since pt has Hx of high ammonia.  Pt alert and oriented x 4 in triage. Pt reports O2 sat of 93 is normal for pt.

## 2016-04-02 NOTE — ED Notes (Signed)
Pt can go to floor at 18:05.

## 2016-04-02 NOTE — ED Provider Notes (Signed)
[name] DEPT Provider Note   CSN: [id number] Arrival date & time: 04/02/16  1144     History   Chief Complaint Chief Complaint  Patient presents with  . Back Pain    HPI [name] is a 65 y.o. female.  HPI Patient presents with one week of low back pain. States she woke up with pain. Has progressed throughout the week and now she is having diffuse abdominal pain. Does not have any focal weakness or numbness. No radiation of the pain to her legs. No fever or chills. She believes that she is having worsening wheezing because been lying in bed. She denies cough or chest pain. No nausea or vomiting. Denies dysuria, frequency or urgency. Family at bedside saying patient has been increasingly confused today. Making statements that are not rational. Past Medical History:  Diagnosis Date  . Allergic rhinitis   . Anemia   . Asthma   . Asthma   . Atrial fibrillation ([place])    s/p RFA and Maze procedure - resolved  . Atrial tachycardia ([place])   . Depression   . Dyspnea   . Exercise hypoxemia    supplemental O2 PRN  . Fatigue   . Gastritis    NSAID induced  . GERD (gastroesophageal reflux disease)   . Heart murmur    severe MR s/p MV repair  . Hx of asplenia    surgical  . Hypertension   . 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 Myoclonus   . Osteoporosis   . Pulmonary HTN    mild to moderate by [place] 05/2012  . 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 Syncope and collapse 2012   r/t anemia  . Trochanteric bursitis    left hip    Patient Active Problem List   Diagnosis Date Noted  . Lobar pneumonia ([place]) 04/02/2016  . Chronic respiratory failure ([place]) 10/12/2014  . Sinobronchitis 06/25/2014  . Asthma exacerbation 09/08/2013  . Chronic anticoagulation- Eliquis started 08/26/13 09/08/2013  . Normal coronary arteries- 2011 09/08/2013  . Chronic diastolic CHF (congestive heart failure) ([place]) 08/26/2013  .  Acute asthmatic bronchitis 06/03/2013  . Heart murmur   . Mitral regurgitation- MV repair 9/11   . Exercise hypoxemia   . Pulmonary HTN 06/16/2012  .  hemolytic anemia 09/02/2009  . Asthma, intrinsic 09/02/2009  . 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  09/02/2009  . OBSTRUCTIVE SLEEP APNEA 08/03/2009  . DYSPNEA 08/03/2009  . Essential hypertension 08/02/2009  . PAF- recurrent, Maze 9/11 08/02/2009  . Allergic rhinitis 08/02/2009  . GERD 08/02/2009    Past Surgical History:  Procedure Laterality Date  . ABLATION     afib  . CARDIAC CATHETERIZATION N/A 10/29/2014   Procedure: Right Heart Cath;  Surgeon: Larey Dresser, MD;  Location: [facility] LAB;  Service: Cardiovascular;  Laterality: N/A;  . CARDIOVERSION N/A 09/11/2013   Procedure: CARDIOVERSION;  Surgeon: Jolaine Artist, MD;  Location: [facility] ENDOSCOPY;  Service: Cardiovascular;  Laterality: N/A;  . MAZE     with MV repair  . MITRAL VALVE REPAIR  01/2010   with MAZE  . myocardial window  2009   s/p afib ablation  . TEE WITHOUT CARDIOVERSION N/A 09/11/2013   Procedure: TRANSESOPHAGEAL ECHOCARDIOGRAM (TEE);  Surgeon: Jolaine Artist, MD;  Location: [facility] ENDOSCOPY;  Service: Cardiovascular;  Laterality: N/A;    OB History    No data available  Home Medications    Prior to Admission medications   Medication Sig Start Date End Date Taking? Authorizing Provider  albuterol (PROVENTIL HFA;VENTOLIN HFA) 108 (90 Base) MCG/ACT inhaler Inhale 2 puffs into the lungs every 6 (six) hours as needed for wheezing or shortness of breath. 03/14/16  Yes Marijean Heath, NP  apixaban (ELIQUIS) 5 MG TABS tablet Take 1 tablet (5 mg total) by mouth 2 (two) times daily. 02/14/16  Yes Larey Dresser, MD  bisoprolol (ZEBETA) 5 MG tablet TAKE 1 TABLET(5 MG) BY MOUTH TWICE DAILY 02/14/16  Yes Larey Dresser, MD  BREO ELLIPTA 100-25 MCG/INH AEPB INHALE 1 PUFF BY MOUTH EVERY DAY 02/07/16  Yes Collene Gobble, MD  calcium-vitamin D (OSCAL  WITH D) 500-200 MG-UNIT tablet Take 1 tablet by mouth 2 (two) times daily.   Yes Historical Provider, MD  CARTIA XT 240 MG 24 hr capsule TAKE 1 CAPSULE BY MOUTH DAILY 02/22/16  Yes Jolaine Artist, MD  CHLORPHENIRAMINE MALEATE PO Take 4 mg by mouth at bedtime.   Yes Historical Provider, MD  cholecalciferol (VITAMIN D) 1000 UNITS tablet Take 1,000 Units by mouth 2 (two) times daily.    Yes Historical Provider, MD  clonazePAM (KLONOPIN) 1 MG tablet Take 1-2 mg by mouth at bedtime.    Yes Historical Provider, MD  deferasirox (EXJADE) 500 MG disintegrating tablet Take 3 tablets (1,500 mg total) by mouth daily before breakfast. Faxed to [facility] on 02/17/16 03/30/16  Yes Ladell Pier, MD  EPINEPHrine (EPI-PEN) 0.3 mg/0.3 mL DEVI Inject 0.3 mg into the muscle once as needed.   Yes Historical Provider, MD  escitalopram (LEXAPRO) 10 MG tablet Take 10 mg by mouth daily.  03/03/14  Yes Historical Provider, MD  fexofenadine (ALLEGRA) 180 MG tablet Take 180 mg by mouth daily.    Yes Historical Provider, MD  fluticasone (FLONASE) 50 MCG/ACT nasal spray Place 1 spray into both nostrils daily.   Yes Historical Provider, MD  folic acid (FOLVITE) 1 MG tablet Take 1 mg by mouth daily.   Yes Historical Provider, MD  Multiple Vitamins-Minerals (AIRBORNE) CHEW Chew 1 tablet by mouth 2 (two) times daily.    Yes Historical Provider, MD  NON FORMULARY Inhale 2 L into the lungs continuous. Oxygen at 2 l/min [place]   Yes Historical Provider, MD  pantoprazole (PROTONIX) 40 MG tablet Take 40 mg by mouth daily.   Yes Historical Provider, MD  potassium chloride SA (K-DUR,KLOR-CON) 20 MEQ tablet TAKE 3 TABLETS(60 MEQ) BY MOUTH TWICE DAILY 01/10/16  Yes Larey Dresser, MD  Probiotic Product (ALIGN) 4 MG CAPS Take 1 tablet by mouth daily.   Yes Historical Provider, MD  raloxifene (EVISTA) 60 MG tablet Take 60 mg by mouth daily.   Yes Historical Provider, MD  sodium chloride (OCEAN) 0.65 % SOLN nasal spray Place 1 spray into both  nostrils 3 (three) times daily as needed for congestion.   Yes Historical Provider, MD  spironolactone (ALDACTONE) 25 MG tablet TAKE 1/2 TABLET(12.5 MG) BY MOUTH DAILY 01/25/16  Yes Larey Dresser, MD  torsemide (DEMADEX) 20 MG tablet TAKE 4 TABLETS BY MOUTH DAILY 11/22/15  Yes Jolaine Artist, MD  traMADol (ULTRAM) 50 MG tablet Take 50 mg by mouth every 6 (six) hours as needed for moderate pain. 3-4 times daily   Yes Historical Provider, MD  [place] Name: CPAP   Yes Historical Provider, MD  amoxicillin-clavulanate (AUGMENTIN) 875-125 MG tablet Take 1 tablet by mouth 2 (  two) times daily. Patient not taking: Reported on 04/02/2016 03/14/16   Marijean Heath, NP  oxyCODONE-acetaminophen (PERCOCET/ROXICET) 5-325 MG tablet Take 1 tablet by mouth every 8 (eight) hours as needed for severe pain. Patient not taking: Reported on 04/02/2016 11/24/15   Varney Biles, MD  predniSONE (DELTASONE) 10 MG tablet 4 tabs po daily x 2 days then 3 po daily x 2 days then 2 po daily x 2 days then 1 tab po daily x 2 days then STOP Patient not taking: Reported on 04/02/2016 03/14/16   Marijean Heath, NP    Family History Family History  Problem Relation Age of Onset  . Breast cancer Mother 80    ER+  . Melanoma Father 18  . Adrenal disorder Sister     pheochromacytoma - rt. adrenal gland  . COPD Maternal Aunt   . Stomach cancer Paternal Uncle     onset in 15s  . Lung cancer Maternal Grandmother     died late 52s, heavy smoker  . Heart disease Maternal Grandfather     hardening of the arteries, poor circulation  . Ovarian cancer Sister 43    stage IV, now 55    Social History Social History  Substance Use Topics  . Smoking status: Never Smoker  . Smokeless tobacco: Never Used  . Alcohol use Yes     Comment: socially glass red wine monthly     Allergies   Iodine; Shellfish allergy; Iohexol; and Levaquin [levofloxacin in d5w]   Review of Systems Review of Systems    Constitutional: Positive for activity change, appetite change and fatigue. Negative for chills and fever.  HENT: Negative for congestion.   Respiratory: Positive for shortness of breath and wheezing. Negative for cough.   Cardiovascular: Positive for leg swelling. Negative for chest pain and palpitations.  Gastrointestinal: Positive for abdominal pain. Negative for blood in stool, constipation, diarrhea, nausea and vomiting.  Genitourinary: Negative for dysuria, flank pain, frequency, hematuria and pelvic pain.  Musculoskeletal: Positive for back pain and myalgias. Negative for arthralgias, neck pain and neck stiffness.  Skin: Negative for rash and wound.  Neurological: Positive for weakness (generalized). Negative for dizziness, speech difficulty, light-headedness, numbness and headaches.  Psychiatric/Behavioral: Positive for confusion.  All other systems reviewed and are negative.    Physical Exam Updated Vital Signs BP 130/76 (BP Location: Right Arm)   Pulse 70   Temp 98.2 F (36.8 C) (Oral)   Resp 22   SpO2 93%   Physical Exam  Constitutional: She is oriented to person, place, and time. She appears well-developed and well-nourished. She appears distressed.  Appears uncomfortable  HENT:  Head: Normocephalic and atraumatic.  Mouth/Throat: Oropharynx is clear and moist. No oropharyngeal exudate.  Eyes: EOM are normal. Pupils are equal, round, and reactive to light.  Pupils 5 mm bilaterally and sluggish.  Neck: Normal range of motion. Neck supple.  No meningismus  Cardiovascular: Normal rate and regular rhythm.  Exam reveals no gallop and no friction rub.   Pulmonary/Chest: Effort normal. She has wheezes. She has rales.  Scattered expiratory wheezes and rales throughout  Abdominal: Soft. Bowel sounds are normal. She exhibits distension. There is tenderness. There is no rebound and no guarding.  Distended abdomen with diffuse tenderness to palpation. Appears most pronounced in  the right lower quadrant. No rebound and guarding present  Musculoskeletal: Normal range of motion. She exhibits no edema or tenderness.  Bilateral lumbar muscular tenderness to palpation. No definite CVA tenderness. No thoracic  or lumbar tenderness to palpation. 1+ bilateral lower extremity edema. Distal pulses are equal and intact.  Neurological: She is alert and oriented to person, place, and time.  5/5 motor in all extremities. Sensation is intact. No saddle anesthesia.  Skin: Skin is warm and dry. Capillary refill takes less than 2 seconds. No rash noted. No erythema.  Psychiatric: She has a normal mood and affect. Her behavior is normal.  Nursing note and vitals reviewed.    ED Treatments / Results  Labs (all labs ordered are listed, but only abnormal results are displayed) Labs Reviewed  CBC WITH DIFFERENTIAL/PLATELET - Abnormal; Notable for the following:       Result Value   WBC 17.6 (*)    RBC 2.06 (*)    Hemoglobin 7.4 (*)    HCT 22.9 (*)    MCV 111.2 (*)    MCH 35.9 (*)    RDW 16.2 (*)    Platelets 405 (*)    Neutro Abs 13.3 (*)    Monocytes Absolute 2.3 (*)    All other components within normal limits  BRAIN NATRIURETIC PEPTIDE - Abnormal; Notable for the following:    B Natriuretic Peptide 806.9 (*)    All other components within normal limits  COMPREHENSIVE METABOLIC PANEL - Abnormal; Notable for the following:    Sodium 134 (*)    BUN 27 (*)    Creatinine, Ser 1.10 (*)    Calcium 8.7 (*)    Total Bilirubin 1.7 (*)    GFR calc non Af Amer 51 (*)    GFR calc Af Amer 59 (*)    All other components within normal limits  LIPASE, BLOOD  URINALYSIS, ROUTINE W REFLEX MICROSCOPIC (NOT AT [facility])  AMMONIA  I-STAT CG4 LACTIC ACID, ED  I-STAT CHEM 8, ED    EKG  EKG Interpretation  Date/Time:  Monday April 02 2016 14:51:40 EST Ventricular Rate:  73 PR Interval:    QRS Duration: 95 QT Interval:  404 QTC Calculation: 446 R Axis:   96 Text Interpretation:   Age not entered, assumed to be  65 years old for purpose of ECG interpretation Sinus rhythm Short PR interval Borderline right axis deviation Confirmed by Lita Mains  MD, Miki Blank ([id number]) on 04/02/2016 3:20:06 PM       Radiology Ct Abdomen Pelvis Wo Contrast  Result Date: 04/02/2016 CLINICAL DATA:  65 year old female with history of shortness of breath, abdominal pain and low back pain. EXAM: CT CHEST, ABDOMEN AND PELVIS WITHOUT CONTRAST TECHNIQUE: Multidetector CT imaging of the chest, abdomen and pelvis was performed following the standard protocol without IV contrast. COMPARISON:  None. FINDINGS: CT CHEST FINDINGS Cardiovascular: Heart size is enlarged with biatrial dilatation. Status post mitral annuloplasty. There is no significant pericardial fluid, thickening or pericardial calcification. Mediastinum/Nodes: Several borderline enlarged and mildly enlarged mediastinal lymph nodes are noted, measuring up to 12 mm in short axis in the prevascular nodal stations. Esophagus is unremarkable in appearance. No axillary lymphadenopathy. Lungs/Pleura: Diffuse interlobular septal thickening and mild ground-glass attenuation, suggesting a background of mild interstitial pulmonary edema. There is a more focal area of mass-like airspace consolidation in the superior segment of the left lower lobe, concerning for pneumonia. Small left pleural effusion lying dependently. Musculoskeletal: There are no aggressive appearing lytic or blastic lesions noted in the visualized portions of the skeleton. CT ABDOMEN PELVIS FINDINGS Hepatobiliary: Diffuse low attenuation throughout the hepatic parenchyma, compatible with a background of hepatic steatosis. No definite cystic or solid hepatic lesions  are identified on today's noncontrast CT examination. Status post cholecystectomy. Pancreas: No definite pancreatic mass or peripancreatic inflammatory changes on today's noncontrast CT examination. 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 Adrenals/Urinary Tract: Unenhanced appearance of the kidneys and bilateral adrenal glands is normal. No hydroureteronephrosis. Urinary bladder is normal in appearance. Stomach/Bowel: Unenhanced appearance of the stomach is normal. There is no pathologic dilatation of small bowel or colon. The appendix is not confidently identified and may be surgically absent. Regardless, there are no inflammatory changes noted adjacent to the cecum to suggest the presence of an acute appendicitis at this time. Vascular/Lymphatic: Calcified atherosclerotic plaque in the abdominal aorta and pelvic vasculature, without definite aneurysm. No lymphadenopathy is noted in the abdomen or pelvis on today's noncontrast CT examination. Reproductive: Uterus and ovaries are atrophic. Other: Umbilical hernia containing only omental fat. No significant volume of ascites. No pneumoperitoneum. Musculoskeletal: There are no aggressive appearing lytic or blastic lesions noted in the visualized portions of the skeleton. IMPRESSION: 1. Mass-like area of apparent airspace consolidation centered in the superior segment of the left lower lobe with small left parapneumonic pleural effusion. Followup PA and lateral chest radiographs are recommended within the next 2-3 weeks to ensure resolution of this finding, as underlying neoplasm is not excluded. 2. Cardiomegaly with biatrial dilatation, and evidence of mild interstitial pulmonary edema. These findings suggest mild congestive heart failure. 3. Multiple enlarged mediastinal lymph nodes, most evident in the prevascular nodal stations. In the setting of presumed congestive heart failure, these findings are nonspecific, but favored to be benign given that prominent lymph nodes in these regions were also noted on remote prior examination 09/07/2009. No other lymphadenopathy is noted elsewhere in the abdomen or pelvis. Should the apparent airspace consolidation in the superior  segment of the left lower lobe fail to resolve, the possibility of underlying primary bronchogenic malignancy with associated mediastinal lymphadenopathy should be considered. 4. Aortic atherosclerosis. 5. Hepatic steatosis. 6. Umbilical hernia containing only omental fat incidentally noted. 7. Additional incidental findings, as above. Electronically Signed   By: Vinnie Langton M.D.   On: 04/02/2016 16:59   Ct Chest Wo Contrast  Result Date: 04/02/2016 CLINICAL DATA:  65 year old female with history of shortness of breath, abdominal pain and low back pain. EXAM: CT CHEST, ABDOMEN AND PELVIS WITHOUT CONTRAST TECHNIQUE: Multidetector CT imaging of the chest, abdomen and pelvis was performed following the standard protocol without IV contrast. COMPARISON:  None. FINDINGS: CT CHEST FINDINGS Cardiovascular: Heart size is enlarged with biatrial dilatation. Status post mitral annuloplasty. There is no significant pericardial fluid, thickening or pericardial calcification. Mediastinum/Nodes: Several borderline enlarged and mildly enlarged mediastinal lymph nodes are noted, measuring up to 12 mm in short axis in the prevascular nodal stations. Esophagus is unremarkable in appearance. No axillary lymphadenopathy. Lungs/Pleura: Diffuse interlobular septal thickening and mild ground-glass attenuation, suggesting a background of mild interstitial pulmonary edema. There is a more focal area of mass-like airspace consolidation in the superior segment of the left lower lobe, concerning for pneumonia. Small left pleural effusion lying dependently. Musculoskeletal: There are no aggressive appearing lytic or blastic lesions noted in the visualized portions of the skeleton. CT ABDOMEN PELVIS FINDINGS Hepatobiliary: Diffuse low attenuation throughout the hepatic parenchyma, compatible with a background of hepatic steatosis. No definite cystic or solid hepatic lesions are identified on today's noncontrast CT examination. Status  post cholecystectomy. Pancreas: No definite pancreatic mass or peripancreatic inflammatory changes on today's noncontrast CT examination. 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 Adrenals/Urinary Tract: Unenhanced appearance of the kidneys and bilateral adrenal glands is normal. No hydroureteronephrosis. Urinary bladder is normal in appearance. Stomach/Bowel: Unenhanced appearance of the stomach is normal. There is no pathologic dilatation of small bowel or colon. The appendix is not confidently identified and may be surgically absent. Regardless, there are no inflammatory changes noted adjacent to the cecum to suggest the presence of an acute appendicitis at this time. Vascular/Lymphatic: Calcified atherosclerotic plaque in the abdominal aorta and pelvic vasculature, without definite aneurysm. No lymphadenopathy is noted in the abdomen or pelvis on today's noncontrast CT examination. Reproductive: Uterus and ovaries are atrophic. Other: Umbilical hernia containing only omental fat. No significant volume of ascites. No pneumoperitoneum. Musculoskeletal: There are no aggressive appearing lytic or blastic lesions noted in the visualized portions of the skeleton. IMPRESSION: 1. Mass-like area of apparent airspace consolidation centered in the superior segment of the left lower lobe with small left parapneumonic pleural effusion. Followup PA and lateral chest radiographs are recommended within the next 2-3 weeks to ensure resolution of this finding, as underlying neoplasm is not excluded. 2. Cardiomegaly with biatrial dilatation, and evidence of mild interstitial pulmonary edema. These findings suggest mild congestive heart failure. 3. Multiple enlarged mediastinal lymph nodes, most evident in the prevascular nodal stations. In the setting of presumed congestive heart failure, these findings are nonspecific, but favored to be benign given that prominent lymph nodes in these regions were also noted  on remote prior examination 09/07/2009. No other lymphadenopathy is noted elsewhere in the abdomen or pelvis. Should the apparent airspace consolidation in the superior segment of the left lower lobe fail to resolve, the possibility of underlying primary bronchogenic malignancy with associated mediastinal lymphadenopathy should be considered. 4. Aortic atherosclerosis. 5. Hepatic steatosis. 6. Umbilical hernia containing only omental fat incidentally noted. 7. Additional incidental findings, as above. Electronically Signed   By: Vinnie Langton M.D.   On: 04/02/2016 16:59   Dg Chest Port 1 View  Result Date: 04/02/2016 CLINICAL DATA:  Shortness of breath and cough EXAM: PORTABLE CHEST 1 VIEW COMPARISON:  March 14, 2016 FINDINGS: There is moderate interstitial pulmonary edema. There is no airspace consolidation. Heart is mildly enlarged with pulmonary venous hypertension. There is prominence in the left perihilar region. No other areas concerning for potential adenopathy. No bone lesions. IMPRESSION: Evidence a degree of congestive heart failure. Prominence in the left hilar region, concerning for adenopathy. This finding warrants contrast enhanced chest CT to further assess. Electronically Signed   By: Lowella Grip III M.D.   On: 04/02/2016 15:08    Procedures Procedures (including critical care time)  Medications Ordered in ED Medications  cefTRIAXone (ROCEPHIN) 1 g in dextrose 5 % 50 mL IVPB (1 g Intravenous New Bag/Given 04/02/16 1731)  azithromycin (ZITHROMAX) 500 mg in dextrose 5 % 250 mL IVPB (not administered)  sodium chloride 0.9 % bolus 500 mL (0 mLs Intravenous Stopped 04/02/16 1556)  morphine 2 MG/ML injection 4 mg (4 mg Intravenous Given 04/02/16 1452)  ondansetron (ZOFRAN) injection 4 mg (4 mg Intravenous Given 04/02/16 1452)     Initial Impression / Assessment and Plan / ED Course  I have reviewed the triage vital signs and the nursing notes.  Pertinent labs & imaging results  that were available during my care of the patient were reviewed by me and considered in my medical decision making (see chart for details).  Clinical Course    Evidence of consolidated mass on CT consistent with pneumonia. Elevation in  white blood cell counts. Started antibiotics for community acquired pneumonia. Discussed with hospitalist and will admit to telemetry bed. Repeat abdominal exam is benign. Patient is now much more comfortable.   Final Clinical Impressions(s) / ED Diagnoses   Final diagnoses:  Community acquired pneumonia, unspecified laterality  Anemia, unspecified type    New Prescriptions New Prescriptions   No medications on file     Julianne Rice, MD 04/02/16 1734

## 2016-04-03 ENCOUNTER — Inpatient Hospital Stay (HOSPITAL_COMMUNITY): Payer: Medicare Other

## 2016-04-03 LAB — BASIC METABOLIC PANEL
ANION GAP: 9 (ref 5–15)
BUN: 17 mg/dL (ref 6–20)
CALCIUM: 8.7 mg/dL — AB (ref 8.9–10.3)
CO2: 30 mmol/L (ref 22–32)
Chloride: 99 mmol/L — ABNORMAL LOW (ref 101–111)
Creatinine, Ser: 0.82 mg/dL (ref 0.44–1.00)
GFR calc Af Amer: >60 mL/min (ref >60)
GLUCOSE: 105 mg/dL — AB (ref 65–99)
POTASSIUM: 3.4 mmol/L — AB (ref 3.5–5.1)
SODIUM: 138 mmol/L (ref 135–145)

## 2016-04-03 LAB — CBC
HCT: 22 % — ABNORMAL LOW (ref 36.0–46.0)
HEMOGLOBIN: 7 g/dL — AB (ref 12.0–15.0)
MCH: 35.5 pg — AB (ref 26.0–34.0)
MCHC: 31.8 g/dL (ref 30.0–36.0)
MCV: 111.7 fL — ABNORMAL HIGH (ref 78.0–100.0)
PLATELETS: 393 10*3/uL (ref 150–400)
RBC: 1.97 MIL/uL — AB (ref 3.87–5.11)
RDW: 16.3 % — ABNORMAL HIGH (ref 11.5–15.5)
WBC: 17.6 10*3/uL — AB (ref 4.0–10.5)

## 2016-04-03 LAB — EXPECTORATED SPUTUM ASSESSMENT W REFEX TO RESP CULTURE

## 2016-04-03 LAB — EXPECTORATED SPUTUM ASSESSMENT W GRAM STAIN, RFLX TO RESP C: Special Requests: NORMAL

## 2016-04-03 LAB — HIV ANTIBODY (ROUTINE TESTING W REFLEX): HIV Screen 4th Generation wRfx: NONREACTIVE

## 2016-04-03 LAB — MAGNESIUM: MAGNESIUM: 1.7 mg/dL (ref 1.7–2.4)

## 2016-04-03 MED ORDER — METHOCARBAMOL 500 MG PO TABS
750.0000 mg | ORAL_TABLET | Freq: Four times a day (QID) | ORAL | Status: AC
  Administered 2016-04-03 (×4): 750 mg via ORAL
  Filled 2016-04-03 (×4): qty 2

## 2016-04-03 MED ORDER — DEFERASIROX 500 MG PO TBSO
1500.0000 mg | ORAL_TABLET | Freq: Every day | ORAL | Status: DC
  Administered 2016-04-03 – 2016-04-07 (×4): 1500 mg via ORAL

## 2016-04-03 MED ORDER — POTASSIUM CHLORIDE CRYS ER 20 MEQ PO TBCR
40.0000 meq | EXTENDED_RELEASE_TABLET | ORAL | Status: DC
  Administered 2016-04-03 – 2016-04-08 (×11): 40 meq via ORAL
  Filled 2016-04-03 (×11): qty 2

## 2016-04-03 MED ORDER — METHOCARBAMOL 500 MG PO TABS
750.0000 mg | ORAL_TABLET | Freq: Four times a day (QID) | ORAL | Status: DC | PRN
Start: 2016-04-04 — End: 2016-04-08
  Administered 2016-04-04 – 2016-04-06 (×8): 750 mg via ORAL
  Filled 2016-04-03 (×9): qty 2

## 2016-04-03 NOTE — Progress Notes (Signed)
RT set up CPAP for patient. Sterile water added to water chamber for humidification. 2 liters oxygen bleed into machine per patients HR. Patient not ready to be placed on CPAP at this time. RT will continue to monitor.

## 2016-04-03 NOTE — Progress Notes (Signed)
PROGRESS NOTE [facility]   [name]   [id number] DOB: April 14, 1951  DOA: 04/02/2016 PCP: Henrine Screws, MD   Brief Narrative:   [name]  is a 65 y.o. female, With history of chronic diastolic CHF, asthma, pulmonary hypertension with chronic respiratory failure on 2 L home O2, A. fib status post RFA and Maze procedure, on anticoagulation, pyruvate kinase deficiency hemolytic anemia, mitral regurgitation status post repair who was brought to the ED with family with one week off mid to lower left back pain. Found to have left lower lobe pneumonia with small parapneumonic pleural effusion. Also some cardiomegaly with mild interstitial pulmonary edema. Patient admitted for IV antibiotic and pain management for abdominal pain and back spasm.   Subjective: Patient seen and examined this morning. Continues to complain of back pain. Denies chest pain shortness of breath and palpitations. Tolerating diet well remains afebrile.  Assessment & Plan:   Left lobar pneumonia with small parapneumonic effusion -Continue IV antibiotics Rocephin and azithromycin -Follow-up Legionella antigen -Strep pneumo negative -Blood cultures negative up-to-date -Supportive care with Tylenol and antitussives. -Will repeat chest x-ray to evaluate the effusion - if increasing consider pulmonary consult for thoracentesis -Per radiology recommends follow-up PA and lateral chest x-ray in 2-3 weeks to ensure resolution and rule out underlying neoplasm.  Acute on chronic diastolic CHF - last echo on 2016 show EF of 50% to [id number] grade 2 diastolic dysfunction -Was not compliant with Lasix because of difficulty going to the bathroom -Continue IV Lasix 60 mg twice a day -Monitor BMP for kidney function and electrolyte disturbances -Strict I&O's and daily weight -Continue Imdur and beta blocker -We'll obtain echo  Abdominal pain, acute, generalized - some history of IBS although patient having  regular bowel movement, has improved since yesterday, unknown etiology at this time CT abdomen pelvis unremarkable. Lactic acid, LFTs and lipase normal. Unclear if this is radiation of her pain from left lower lobe of the lung or the back. -If worsened or unimproved will plan on repeat imaging. -Pain control as needed  Left mid-lower back pain. - Back spasm - tender on exam -At muscle relaxant, Robaxin 750 3 times a day for 1 day, and switch tomorrow to when necessary -Pain medication as needed -PT   Obstructive sleep apnea -Continue nighttime CPAP.  Essential hypertension -Stable. Resume home medications  PAF- recurrent, Maze 9/11 -Continue beta blocker and anticoagulation.  Asthma, intrinsic No acute symptoms. Continue albuterol inhaler.  Pulmonary HTN Follows with Dr. Lamonte Sakai. Continue diuresis for now.  Pyruvate kinase deficiency hemolytic anemia Hemoglobin around baseline. Does not need transfusion at present. Follows with Dr. Learta Codding.   DVT prophylaxis: Eliquis  Code Status: Full Family Communication: None at bedside Disposition Plan: If clinically improve and possible discharge in 48 hours.   Consultants:   None  Procedures:   Echo ordered  Antimicrobials:  Rocephin 11/6  Azithromycin 11/6   Objective: Vitals:   04/02/16 2147 04/03/16 0546 04/03/16 0901 04/03/16 1305  BP: (!) 142/73 132/63 136/74 114/61  Pulse: 78 73 72 73  Resp: (!) 21 (!) 21  18  Temp: 98.7 F (37.1 C) 98.5 F (36.9 C) 99.1 F (37.3 C) 98.2 F (36.8 C)  TempSrc: Oral Oral Oral Oral  SpO2: 95% 93% (!) 81% 96%  Weight:  69.4 kg (153 lb)    Height:        Intake/Output Summary (Last 24 hours) at 04/03/16 1540 Last data filed at 04/03/16 1300  Gross per 24 hour  Intake             1480 ml  Output             3500 ml  Net            -2020 ml   Filed Weights   04/02/16 1831 04/03/16 0546  Weight: 78.6 kg (173 lb 4.5 oz) 69.4 kg (153 lb)    Examination:  General  exam: Appears calm and comfortable  Respiratory system: Good air entry, fine crackles at the left lower base. Mild wheezing at upper bilateral lobes Cardiovascular system: S1 & S2 heard, RRR. mild JVD, murmurs, rubs or gallops Gastrointestinal system: Soft, mild distended non-tenderness bowel sounds present  Central nervous system: Alert and oriented. No focal neurological deficits. Musculoskeletal: Paraspinal tenderness along the left side extending to the left lower rib cage Extremities: 1+ pitting pedal edema. Skin: No rashes, lesions or ulcers Psychiatry: Judgement and insight appear normal. Mood & affect appropriate.    Data Reviewed: I have personally reviewed following labs and imaging studies  CBC:  Recent Labs Lab 04/02/16 1446 04/03/16 0504  WBC 17.6* 17.6*  NEUTROABS 13.3*  --   HGB 7.4* 7.0*  HCT 22.9* 22.0*  MCV 111.2* 111.7*  PLT 405* [id number]   Basic Metabolic Panel:  Recent Labs Lab 04/02/16 1446 04/03/16 0504  NA 134* 138  K 4.2 3.4*  CL 102 99*  CO2 26 30  GLUCOSE 94 105*  BUN 27* 17  CREATININE 1.10* 0.82  CALCIUM 8.7* 8.7*  MG  --  1.7   GFR: Estimated Creatinine Clearance: 60.7 mL/min (by C-G formula based on SCr of 0.82 mg/dL). Liver Function Tests:  Recent Labs Lab 04/02/16 1446  AST 20  ALT 16  ALKPHOS 83  BILITOT 1.7*  PROT 7.1  ALBUMIN 3.5    Recent Labs Lab 04/02/16 1446  LIPASE 23    Recent Labs Lab 04/02/16 1447  AMMONIA 23   Coagulation Profile: No results for input(s): INR, PROTIME in the last 168 hours. Cardiac Enzymes: No results for input(s): CKTOTAL, CKMB, CKMBINDEX, TROPONINI in the last 168 hours. BNP (last 3 results) No results for input(s): PROBNP in the last 8760 hours. HbA1C: No results for input(s): HGBA1C in the last 72 hours. CBG: No results for input(s): GLUCAP in the last 168 hours. Lipid Profile: No results for input(s): CHOL, HDL, LDLCALC, TRIG, CHOLHDL, LDLDIRECT in the last 72 hours. Thyroid  Function Tests: No results for input(s): TSH, T4TOTAL, FREET4, T3FREE, THYROIDAB in the last 72 hours. Anemia Panel: No results for input(s): VITAMINB12, FOLATE, FERRITIN, TIBC, IRON, RETICCTPCT in the last 72 hours. Sepsis Labs: No results for input(s): PROCALCITON, LATICACIDVEN in the last 168 hours.  Recent Results (from the past 240 hour(s))  Culture, blood (routine x 2) Call MD if unable to obtain prior to antibiotics being given     Status: None (Preliminary result)   Collection Time: 04/02/16  6:45 PM  Result Value Ref Range Status   Specimen Description BLOOD RIGHT ARM  Final   Special Requests   Final    BOTTLES DRAWN AEROBIC AND ANAEROBIC BLUE 10CC [place]   Culture   Final    NO GROWTH < 24 HOURS Performed at [facility]    Report Status PENDING  Incomplete  Culture, blood (routine x 2) Call MD if unable to obtain prior to antibiotics being given     Status: None (Preliminary result)   Collection Time: 04/02/16  6:55 PM  Result Value Ref Range Status   Specimen Description BLOOD LEFT HAND  Final   Special Requests BOTTLES DRAWN AEROBIC AND ANAEROBIC 10CC  Final   Culture   Final    NO GROWTH < 24 HOURS Performed at [facility]    Report Status PENDING  Incomplete  Culture, sputum-assessment     Status: None   Collection Time: 04/03/16 10:57 AM  Result Value Ref Range Status   Specimen Description SPUTUM  Final   Special Requests Normal  Final   Sputum evaluation   Final    THIS SPECIMEN IS ACCEPTABLE. RESPIRATORY CULTURE REPORT TO FOLLOW.   Report Status 04/03/2016 FINAL  Final         Radiology Studies: Ct Abdomen Pelvis Wo Contrast  Result Date: 04/02/2016 CLINICAL DATA:  65 year old female with history of shortness of breath, abdominal pain and low back pain. EXAM: CT CHEST, ABDOMEN AND PELVIS WITHOUT CONTRAST TECHNIQUE: Multidetector CT imaging of the chest, abdomen and pelvis was performed following the standard protocol without IV  contrast. COMPARISON:  None. FINDINGS: CT CHEST FINDINGS Cardiovascular: Heart size is enlarged with biatrial dilatation. Status post mitral annuloplasty. There is no significant pericardial fluid, thickening or pericardial calcification. Mediastinum/Nodes: Several borderline enlarged and mildly enlarged mediastinal lymph nodes are noted, measuring up to 12 mm in short axis in the prevascular nodal stations. Esophagus is unremarkable in appearance. No axillary lymphadenopathy. Lungs/Pleura: Diffuse interlobular septal thickening and mild ground-glass attenuation, suggesting a background of mild interstitial pulmonary edema. There is a more focal area of mass-like airspace consolidation in the superior segment of the left lower lobe, concerning for pneumonia. Small left pleural effusion lying dependently. Musculoskeletal: There are no aggressive appearing lytic or blastic lesions noted in the visualized portions of the skeleton. CT ABDOMEN PELVIS FINDINGS Hepatobiliary: Diffuse low attenuation throughout the hepatic parenchyma, compatible with a background of hepatic steatosis. No definite cystic or solid hepatic lesions are identified on today's noncontrast CT examination. Status post cholecystectomy. Pancreas: No definite pancreatic mass or peripancreatic inflammatory changes on today's noncontrast CT examination. 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 Adrenals/Urinary Tract: Unenhanced appearance of the kidneys and bilateral adrenal glands is normal. No hydroureteronephrosis. Urinary bladder is normal in appearance. Stomach/Bowel: Unenhanced appearance of the stomach is normal. There is no pathologic dilatation of small bowel or colon. The appendix is not confidently identified and may be surgically absent. Regardless, there are no inflammatory changes noted adjacent to the cecum to suggest the presence of an acute appendicitis at this time. Vascular/Lymphatic: Calcified atherosclerotic  plaque in the abdominal aorta and pelvic vasculature, without definite aneurysm. No lymphadenopathy is noted in the abdomen or pelvis on today's noncontrast CT examination. Reproductive: Uterus and ovaries are atrophic. Other: Umbilical hernia containing only omental fat. No significant volume of ascites. No pneumoperitoneum. Musculoskeletal: There are no aggressive appearing lytic or blastic lesions noted in the visualized portions of the skeleton. IMPRESSION: 1. Mass-like area of apparent airspace consolidation centered in the superior segment of the left lower lobe with small left parapneumonic pleural effusion. Followup PA and lateral chest radiographs are recommended within the next 2-3 weeks to ensure resolution of this finding, as underlying neoplasm is not excluded. 2. Cardiomegaly with biatrial dilatation, and evidence of mild interstitial pulmonary edema. These findings suggest mild congestive heart failure. 3. Multiple enlarged mediastinal lymph nodes, most evident in the prevascular nodal stations. In the setting of presumed congestive heart failure, these findings are nonspecific, but favored to be benign  given that prominent lymph nodes in these regions were also noted on remote prior examination 09/07/2009. No other lymphadenopathy is noted elsewhere in the abdomen or pelvis. Should the apparent airspace consolidation in the superior segment of the left lower lobe fail to resolve, the possibility of underlying primary bronchogenic malignancy with associated mediastinal lymphadenopathy should be considered. 4. Aortic atherosclerosis. 5. Hepatic steatosis. 6. Umbilical hernia containing only omental fat incidentally noted. 7. Additional incidental findings, as above. Electronically Signed   By: Vinnie Langton M.D.   On: 04/02/2016 16:59   Ct Chest Wo Contrast  Result Date: 04/02/2016 CLINICAL DATA:  64 year old female with history of shortness of breath, abdominal pain and low back pain. EXAM: CT  CHEST, ABDOMEN AND PELVIS WITHOUT CONTRAST TECHNIQUE: Multidetector CT imaging of the chest, abdomen and pelvis was performed following the standard protocol without IV contrast. COMPARISON:  None. FINDINGS: CT CHEST FINDINGS Cardiovascular: Heart size is enlarged with biatrial dilatation. Status post mitral annuloplasty. There is no significant pericardial fluid, thickening or pericardial calcification. Mediastinum/Nodes: Several borderline enlarged and mildly enlarged mediastinal lymph nodes are noted, measuring up to 12 mm in short axis in the prevascular nodal stations. Esophagus is unremarkable in appearance. No axillary lymphadenopathy. Lungs/Pleura: Diffuse interlobular septal thickening and mild ground-glass attenuation, suggesting a background of mild interstitial pulmonary edema. There is a more focal area of mass-like airspace consolidation in the superior segment of the left lower lobe, concerning for pneumonia. Small left pleural effusion lying dependently. Musculoskeletal: There are no aggressive appearing lytic or blastic lesions noted in the visualized portions of the skeleton. CT ABDOMEN PELVIS FINDINGS Hepatobiliary: Diffuse low attenuation throughout the hepatic parenchyma, compatible with a background of hepatic steatosis. No definite cystic or solid hepatic lesions are identified on today's noncontrast CT examination. Status post cholecystectomy. Pancreas: No definite pancreatic mass or peripancreatic inflammatory changes on today's noncontrast CT examination. 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 Adrenals/Urinary Tract: Unenhanced appearance of the kidneys and bilateral adrenal glands is normal. No hydroureteronephrosis. Urinary bladder is normal in appearance. Stomach/Bowel: Unenhanced appearance of the stomach is normal. There is no pathologic dilatation of small bowel or colon. The appendix is not confidently identified and may be surgically absent. Regardless,  there are no inflammatory changes noted adjacent to the cecum to suggest the presence of an acute appendicitis at this time. Vascular/Lymphatic: Calcified atherosclerotic plaque in the abdominal aorta and pelvic vasculature, without definite aneurysm. No lymphadenopathy is noted in the abdomen or pelvis on today's noncontrast CT examination. Reproductive: Uterus and ovaries are atrophic. Other: Umbilical hernia containing only omental fat. No significant volume of ascites. No pneumoperitoneum. Musculoskeletal: There are no aggressive appearing lytic or blastic lesions noted in the visualized portions of the skeleton. IMPRESSION: 1. Mass-like area of apparent airspace consolidation centered in the superior segment of the left lower lobe with small left parapneumonic pleural effusion. Followup PA and lateral chest radiographs are recommended within the next 2-3 weeks to ensure resolution of this finding, as underlying neoplasm is not excluded. 2. Cardiomegaly with biatrial dilatation, and evidence of mild interstitial pulmonary edema. These findings suggest mild congestive heart failure. 3. Multiple enlarged mediastinal lymph nodes, most evident in the prevascular nodal stations. In the setting of presumed congestive heart failure, these findings are nonspecific, but favored to be benign given that prominent lymph nodes in these regions were also noted on remote prior examination 09/07/2009. No other lymphadenopathy is noted elsewhere in the abdomen or pelvis. Should the apparent airspace consolidation  in the superior segment of the left lower lobe fail to resolve, the possibility of underlying primary bronchogenic malignancy with associated mediastinal lymphadenopathy should be considered. 4. Aortic atherosclerosis. 5. Hepatic steatosis. 6. Umbilical hernia containing only omental fat incidentally noted. 7. Additional incidental findings, as above. Electronically Signed   By: Vinnie Langton M.D.   On: 04/02/2016  16:59   Dg Chest Port 1 View  Result Date: 04/02/2016 CLINICAL DATA:  Shortness of breath and cough EXAM: PORTABLE CHEST 1 VIEW COMPARISON:  March 14, 2016 FINDINGS: There is moderate interstitial pulmonary edema. There is no airspace consolidation. Heart is mildly enlarged with pulmonary venous hypertension. There is prominence in the left perihilar region. No other areas concerning for potential adenopathy. No bone lesions. IMPRESSION: Evidence a degree of congestive heart failure. Prominence in the left hilar region, concerning for adenopathy. This finding warrants contrast enhanced chest CT to further assess. Electronically Signed   By: Lowella Grip III M.D.   On: 04/02/2016 15:08      Scheduled Meds: . acidophilus  1 capsule Oral Daily  . apixaban  5 mg Oral BID  . azithromycin  500 mg Oral Q24H  . bisoprolol  5 mg Oral BID  . calcium-vitamin D  1 tablet Oral BID  . cefTRIAXone (ROCEPHIN)  IV  1 g Intravenous Q24H  . cholecalciferol  1,000 Units Oral BID  . clonazePAM  1 mg Oral QHS  . deferasirox  1,500 mg Oral QAC supper  . diltiazem  240 mg Oral Daily  . escitalopram  10 mg Oral QHS  . fluticasone  1 spray Each Nare Daily  . folic acid  1 mg Oral Daily  . furosemide  60 mg Intravenous BID  . loratadine  10 mg Oral Daily  . methocarbamol  750 mg Oral QID  . pantoprazole  40 mg Oral Daily  . potassium chloride SA  40 mEq Oral 2 times per day  . raloxifene  60 mg Oral Daily  . sodium chloride flush  3 mL Intravenous Q12H   Continuous Infusions:   LOS: 1 day    Chipper Oman, MD [facility] Pager [phone]  If 7PM-7AM, please contact night-coverage [email or web address] Password [email or web address] 04/03/2016, 3:40 PM

## 2016-04-03 NOTE — Progress Notes (Signed)
RT went to place patient on CPAP. Patient has own mask which did not have the small tubing piece that attaches to the larger tubing. Patient says she will have someone bring the tubing piece so she can wear her own mask. RT will continue to monitor.

## 2016-04-03 NOTE — Evaluation (Signed)
Physical Therapy Evaluation Patient Details Name: [name] MRN: [id number] DOB: 09/14/1950 Today's Date: 04/03/2016   History of Present Illness  [name]  is a 65 y.o. female, With history of chronic diastolic CHF, asthma, pulmonary hypertension with chronic respiratory failure on 2 L home O2, A. fib status post RFA and Maze procedure, on anticoagulation, pyruvate kinase deficiency hemolytic anemia, mitral regurgitation status post repair who was brought to the ED with family with one week off mid to lower left back pain. This was acute and over the next few days became progressive having difficulty ambulating.  Pt found to have PNA.  Clinical Impression  Pt admitted with above diagnosis. Pt currently with functional limitations due to the deficits listed below (see PT Problem List). Pt will benefit from skilled PT to increase their independence and safety with mobility to allow discharge to the venue listed below.  PT eval limited by 10/10 pain, but pt was able to get OOB to recliner.  Pt and husband educated on body mechanics for pain management.  Feel that once pain is better controlled pt's functional mobility will improve and will not need any PT follow up. Will continue to assess while in acute care.     Follow Up Recommendations No PT follow up    Equipment Recommendations  None recommended by PT    Recommendations for Other Services       Precautions / Restrictions Precautions Precautions: None Restrictions Weight Bearing Restrictions: No      Mobility  Bed Mobility Overal bed mobility: Needs Assistance Bed Mobility: Rolling;Sidelying to Sit Rolling: Min assist Sidelying to sit: Min assist       General bed mobility comments: Cueing for log rolling and sidelying to sit with MIN A for transfer with pt hollering out.  Difficulty scooting forward to EOB due to pain, so PT used bed pad to A.  Transfers Overall transfer level: Needs assistance Equipment  used: Rolling walker (2 wheeled) Transfers: Sit to/from [facility] Sit to Stand: Min guard Stand pivot transfers: Min guard       General transfer comment: cues for hand placement.  Shuffeled, guarded SPT with use of RW  Ambulation/Gait             General Gait Details: SPT with RW with MIN/guard  Stairs            Wheelchair Mobility    Modified Rankin (Stroke Patients Only)       Balance Overall balance assessment: Needs assistance   Sitting balance-Leahy Scale: Fair       Standing balance-Leahy Scale: Poor Standing balance comment: requires RW due to pain                             Pertinent Vitals/Pain Pain Assessment: 0-10 Pain Score: 10-Worst pain ever Pain Location: low back L > R and radiating forward toward belly Pain Descriptors / Indicators: Spasm;Constant Pain Intervention(s): Limited activity within patient's tolerance;Monitored during session;Premedicated before session (muscle relaxer prior to session)    Home Living Family/patient expects to be discharged to:: Private residence Living Arrangements: Spouse/significant other Available Help at Discharge: Family;Available 24 hours/day Type of Home: House Home Access: Stairs to enter Entrance Stairs-Rails: Chemical engineer of Steps: 3 Home Layout: Two level;Able to live on main level with bedroom/bathroom Home Equipment: Gilford Rile - 2 wheels;Cane - quad;Shower seat      Prior Function Level of Independence: Independent  Hand Dominance        Extremity/Trunk Assessment   Upper Extremity Assessment: Overall WFL for tasks assessed           Lower Extremity Assessment: Overall WFL for tasks assessed      Cervical / Trunk Assessment: Normal  Communication   Communication: No difficulties  Cognition Arousal/Alertness: Awake/alert Behavior During Therapy: WFL for tasks assessed/performed Overall Cognitive Status:  Within Functional Limits for tasks assessed                      General Comments General comments (skin integrity, edema, etc.): husband present. Reviewed positioning and proper body mechanics that may A with pain control    Exercises     Assessment/Plan    PT Assessment Patient needs continued PT services  PT Problem List Pain;Decreased mobility;Decreased knowledge of use of DME;Decreased activity tolerance          PT Treatment Interventions Gait training;Functional mobility training;Stair training;Therapeutic activities;DME instruction;Therapeutic exercise;Balance training;Patient/family education    PT Goals (Current goals can be found in the Care Plan section)  Acute Rehab PT Goals Patient Stated Goal: decrease pain PT Goal Formulation: With patient/family Time For Goal Achievement: 04/17/16    Frequency Min 3X/week   Barriers to discharge        Co-evaluation               End of Session Equipment Utilized During Treatment: Gait belt;Oxygen Activity Tolerance: Patient limited by pain Patient left: in chair;with call bell/phone within reach;with family/visitor present Nurse Communication: Mobility status         Time: 1100-1129 PT Time Calculation (min) (ACUTE ONLY): 29 min   Charges:   PT Evaluation $PT Eval Moderate Complexity: 1 Procedure PT Treatments $Therapeutic Activity: 8-22 mins   PT G Codes:        [name] 04/03/2016, 12:34 PM

## 2016-04-03 NOTE — Care Management Note (Signed)
Case Management Note  Patient Details  Name: [name] MRN: [id number] Date of Birth: 12/10/1950  Subjective/Objective: 65 y/o f admitted w/PNA. From home.                   Action/Plan:d/c home.   Expected Discharge Date:   (unknown)               Expected Discharge Plan:  Home/Self Care  In-House Referral:     Discharge planning Services     Post Acute Care Choice:    Choice offered to:     DME Arranged:    DME Agency:     HH Arranged:    HH Agency:     Status of Service:  In process, will continue to follow  If discussed at Long Length of Stay Meetings, dates discussed:    Additional Comments:  Dessa Phi, RN 04/03/2016, 3:29 PM

## 2016-04-04 ENCOUNTER — Inpatient Hospital Stay (HOSPITAL_COMMUNITY): Payer: Medicare Other

## 2016-04-04 DIAGNOSIS — I5033 Acute on chronic diastolic (congestive) heart failure: Secondary | ICD-10-CM

## 2016-04-04 DIAGNOSIS — I5032 Chronic diastolic (congestive) heart failure: Secondary | ICD-10-CM

## 2016-04-04 DIAGNOSIS — J181 Lobar pneumonia, unspecified organism: Secondary | ICD-10-CM

## 2016-04-04 DIAGNOSIS — I272 Pulmonary hypertension, unspecified: Secondary | ICD-10-CM

## 2016-04-04 DIAGNOSIS — J9611 Chronic respiratory failure with hypoxia: Secondary | ICD-10-CM

## 2016-04-04 DIAGNOSIS — D594 Other nonautoimmune hemolytic anemias: Secondary | ICD-10-CM

## 2016-04-04 DIAGNOSIS — G4733 Obstructive sleep apnea (adult) (pediatric): Secondary | ICD-10-CM

## 2016-04-04 DIAGNOSIS — I480 Paroxysmal atrial fibrillation: Secondary | ICD-10-CM

## 2016-04-04 DIAGNOSIS — J45909 Unspecified asthma, uncomplicated: Secondary | ICD-10-CM

## 2016-04-04 DIAGNOSIS — I509 Heart failure, unspecified: Secondary | ICD-10-CM

## 2016-04-04 DIAGNOSIS — R1084 Generalized abdominal pain: Secondary | ICD-10-CM

## 2016-04-04 LAB — BASIC METABOLIC PANEL
ANION GAP: 7 (ref 5–15)
BUN: 22 mg/dL — ABNORMAL HIGH (ref 6–20)
CHLORIDE: 96 mmol/L — AB (ref 101–111)
CO2: 30 mmol/L (ref 22–32)
Calcium: 8.4 mg/dL — ABNORMAL LOW (ref 8.9–10.3)
Creatinine, Ser: 1.11 mg/dL — ABNORMAL HIGH (ref 0.44–1.00)
GFR calc non Af Amer: 50 mL/min — ABNORMAL LOW (ref >60)
GFR, EST AFRICAN AMERICAN: 58 mL/min — AB (ref >60)
Glucose, Bld: 120 mg/dL — ABNORMAL HIGH (ref 65–99)
POTASSIUM: 3.9 mmol/L (ref 3.5–5.1)
Sodium: 133 mmol/L — ABNORMAL LOW (ref 135–145)

## 2016-04-04 LAB — CBC
HEMATOCRIT: 22.9 % — AB (ref 36.0–46.0)
HEMOGLOBIN: 7.2 g/dL — AB (ref 12.0–15.0)
MCH: 35.3 pg — AB (ref 26.0–34.0)
MCHC: 31.4 g/dL (ref 30.0–36.0)
MCV: 112.3 fL — AB (ref 78.0–100.0)
Platelets: 420 10*3/uL — ABNORMAL HIGH (ref 150–400)
RBC: 2.04 MIL/uL — AB (ref 3.87–5.11)
RDW: 16.1 % — ABNORMAL HIGH (ref 11.5–15.5)
WBC: 16.9 10*3/uL — AB (ref 4.0–10.5)

## 2016-04-04 LAB — ECHOCARDIOGRAM COMPLETE
Height: 62 in
WEIGHTICAEL: 2720 [oz_av]

## 2016-04-04 MED ORDER — MORPHINE SULFATE (PF) 4 MG/ML IV SOLN
2.0000 mg | INTRAVENOUS | Status: DC | PRN
  Administered 2016-04-04 – 2016-04-05 (×8): 2 mg via INTRAVENOUS
  Filled 2016-04-04 (×9): qty 1

## 2016-04-04 MED ORDER — PERFLUTREN LIPID MICROSPHERE
INTRAVENOUS | Status: AC
  Filled 2016-04-04: qty 10

## 2016-04-04 MED ORDER — FLUTICASONE FUROATE-VILANTEROL 100-25 MCG/INH IN AEPB
1.0000 | INHALATION_SPRAY | Freq: Every day | RESPIRATORY_TRACT | Status: DC
  Administered 2016-04-05 – 2016-04-08 (×4): 1 via RESPIRATORY_TRACT
  Filled 2016-04-04: qty 28

## 2016-04-04 MED ORDER — FUROSEMIDE 10 MG/ML IJ SOLN
20.0000 mg | Freq: Two times a day (BID) | INTRAMUSCULAR | Status: DC
  Administered 2016-04-04 – 2016-04-06 (×5): 20 mg via INTRAVENOUS
  Filled 2016-04-04 (×5): qty 2

## 2016-04-04 MED ORDER — PERFLUTREN LIPID MICROSPHERE
1.0000 mL | INTRAVENOUS | Status: AC | PRN
  Administered 2016-04-04: 2 mL via INTRAVENOUS
  Filled 2016-04-04: qty 10

## 2016-04-04 NOTE — Care Management Note (Signed)
Case Management Note  Patient Details  Name: [name] MRN: [id number] Date of Birth: 1950-07-21  Subjective/Objective:   No PT f/u. Already has home 02. No further CM needs.                 Action/Plan:d/c home.   Expected Discharge Date:   (unknown)               Expected Discharge Plan:  Home/Self Care  In-House Referral:     Discharge planning Services     Post Acute Care Choice:    Choice offered to:     DME Arranged:    DME Agency:     HH Arranged:    HH Agency:     Status of Service:  In process, will continue to follow  If discussed at Long Length of Stay Meetings, dates discussed:    Additional Comments:  Dessa Phi, RN 04/04/2016, 3:33 PM

## 2016-04-04 NOTE — Progress Notes (Signed)
Physical Therapy Treatment Patient Details Name: [name] MRN: [id number] DOB: [birth year] Today's Date: 04/04/2016    History of Present Illness [name]  is a 65 y.o. female, With history of chronic diastolic CHF, asthma, pulmonary hypertension with chronic respiratory failure on 2 L home O2, A. fib status post RFA and Maze procedure, on anticoagulation, pyruvate kinase deficiency hemolytic anemia, mitral regurgitation status post repair who was brought to the ED with family with one week off mid to lower left back pain. This was acute and over the next few days became progressive having difficulty ambulating.  Pt found to have PNA.    PT Comments    Pt reports pain 9/10 today however able to ambulate in hallway.  Follow Up Recommendations  No PT follow up     Equipment Recommendations  None recommended by PT    Recommendations for Other Services       Precautions / Restrictions Precautions Precautions: Fall    Mobility  Bed Mobility Overal bed mobility: Needs Assistance Bed Mobility: Rolling;Sidelying to Sit;Sit to Sidelying Rolling: Supervision Sidelying to sit: Supervision     Sit to sidelying: Supervision General bed mobility comments: verbal cues for log roll technique, pt able to perform slowly but well  Transfers Overall transfer level: Needs assistance Equipment used: Rolling walker (2 wheeled) Transfers: Sit to/from Stand Sit to Stand: Min guard         General transfer comment: verbal cues for safe technique  Ambulation/Gait Ambulation/Gait assistance: Min guard Ambulation Distance (Feet): 200 Feet Assistive device: Rolling walker (2 wheeled) Gait Pattern/deviations: Step-through pattern     General Gait Details: able to tolerate distance today, still c/o 9/10 back pain however reports better then yesterday, distance to pt tolerance, remained on 2L O2 [place]   Stairs            Wheelchair Mobility    Modified Rankin (Stroke  Patients Only)       Balance                                    Cognition Arousal/Alertness: Awake/alert Behavior During Therapy: WFL for tasks assessed/performed Overall Cognitive Status: Within Functional Limits for tasks assessed                      Exercises      General Comments        Pertinent Vitals/Pain Pain Assessment: 0-10 Pain Score: 9  Pain Location: low back, more left sided Pain Descriptors / Indicators: Constant Pain Intervention(s): Limited activity within patient's tolerance;Monitored during session;Premedicated before session;Repositioned    Home Living                      Prior Function            PT Goals (current goals can now be found in the care plan section) Progress towards PT goals: Progressing toward goals    Frequency    Min 3X/week      PT Plan Current plan remains appropriate    Co-evaluation             End of Session Equipment Utilized During Treatment: Gait belt;Oxygen Activity Tolerance: Patient limited by pain Patient left: with call bell/phone within reach;with family/visitor present;in bed     Time: [id number] PT Time Calculation (min) (ACUTE ONLY): 15 min  Charges:  $Gait Training: 8-22 mins  G Codes:      [name],[name] 04-08-16, 1:31 PM [name], PT, DPT April 08, 2016 Pager: [phone]

## 2016-04-04 NOTE — Progress Notes (Signed)
Echocardiogram 2D Echocardiogram has been performed with definity.  Aggie Cosier 04/04/2016, 12:23 PM

## 2016-04-04 NOTE — Progress Notes (Signed)
PROGRESS NOTE    [name]  [id number] DOB: February 23, 1951 DOA: 04/02/2016 PCP: Henrine Screws, MD   Brief Narrative:  [name] a 65 y.o.female,With history of chronic diastolic CHF, asthma, pulmonary hypertension with chronic respiratory failure on 2 L home O2, A. fib status post RFA and Maze procedure, on anticoagulation, pyruvate kinase deficiency hemolytic anemia, mitral regurgitation status post repair who was brought to the ED with family with one week off mid to lower left back pain. Found to have left lower lobe pneumonia with small parapneumonic pleural effusion. Also some cardiomegaly with mild interstitial pulmonary edema. Patient admitted for IV antibiotic and pain management for abdominal pain and back spasm. Slowly improving.   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 -WBC Slightly Decreasing -Per radiology recommends follow-up PA and lateral chest x-ray in 2-3 weeks to ensure resolution and rule out underlying neoplasm.  Acute on chronic diastolic CHF - last echo on 2016 show EF of 50% to [id number] grade 2 diastolic dysfunction -Was not compliant with Lasix because of difficulty going to the bathroom -Drecreased IV Lasix 60 mg twice a day to 20 mg IV BID -Monitor BMP for kidney function and electrolyte disturbances -Strict I&O's and daily weight -Continue  Imdur and beta blocker -We'll obtain echo -IF not Improving will consult Cardiology.   Mild AKI -Decreased IV Diuresis -Continue to Monitor  Abdominal pain, acute, generalized - some history of IBS although patient having regular bowel movement, has improved since yesterday, unknown etiology at this time -CT abdomen pelvis unremarkable. Lactic acid, LFTs and lipase normal. Unclear if this is radiation of her pain from left lower lobe of the lung or the back. -If worsened or unimproved will plan on repeat imaging. -Pain control as needed  Left mid-lower back pain. - Back spasm  -At muscle relaxant, Robaxin 750 3 times a day for 1 day, and switch tomorrow to when necessary -Pain medication as needed -PT to Eval and Treat  Obstructive sleep apnea -Continue nighttime CPAP.  Essential hypertension -Stable. 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  Asthma No acute symptoms. Continue albuterol inhaler. Restarted Home Breo  Pulmonary HTN Follows with Dr. Lamonte Sakai. Continue diuresis for now.  Pyruvate kinase deficiency hemolytic anemia Hemoglobin around baseline and patient states that if her Hb is around 8 its better than normal. Does not need transfusion at present. Follows with Dr. Learta Codding. States her WBC is usually elevated at 12K.   DVT prophylaxis: Eliquis CODE STATUS: FULL Family Communication: No Family at Bedside Disposition Plan: Likely Home when improved  Consultants:   None  Procedures:  None  Antimicrobials: Azithromycin and Ceftriaxone  Subjective: Seen and examined at bedside and states her swelling in legs is improved. SOB also improved and not as much back pain. No N/V or Abdominal Pain. No other concerns or complaints at this time.  Objective: Vitals:   04/03/16 2229 04/04/16 0542 04/04/16 1021 04/04/16 1500  BP: (!) 109/55 112/65 133/67 128/60    Pulse: 70 65 69 68  Resp: 18 20    Temp: 99.6 F (37.6 C) 98.3 F (36.8 C)  98.6 F (37 C)  TempSrc: Oral Oral  Oral  SpO2: 98% 93% 92% 93%  Weight:  77.1 kg (170 lb)    Height:        Intake/Output Summary (Last 24 hours) at 04/04/16 2100 Last data filed at 04/04/16 1734  Gross per 24 hour  Intake              530 ml  Output              650 ml  Net             -120 ml   Filed Weights   04/02/16 1831 04/03/16 0546 04/04/16 0542  Weight: 78.6 kg (173 lb 4.5 oz) 69.4 kg (153 lb) 77.1 kg (170 lb)    Examination: Physical Exam:  Constitutional: WN/WD, NAD and appears calm and comfortable Eyes: Lids and conjunctivae normal, sclerae anicteric  ENMT: External Ears, Nose appear normal. Grossly normal hearing.  Neck: Appears normal, supple, no cervical masses, normal ROM, no appreciable thyromegaly Respiratory: Diminished but Clear to auscultation bilaterally, no wheezing, rales, rhonchi or crackles. Normal respiratory effort and patient is not tachypenic. No accessory muscle use.  Cardiovascular: RRR, no murmurs / rubs / gallops. S1 and S2 auscultated. Mild LE extremity edema.   Abdomen: Soft, non-tender, non-distended. No masses palpated. No appreciable hepatosplenomegaly. Bowel sounds positive.  GU: Deferred. Musculoskeletal: No clubbing / cyanosis of digits/nails. No joint deformity upper and lower extremities. Normal strength and muscle tone.  Skin: No rashes, lesions, ulcers. No induration; Warm and dry.  Neurologic: CN 2-12 grossly intact with no focal deficits. Sensation intact in all 4 Extremities. Romberg sign cerebellar reflexes not assessed.  Psychiatric: Normal judgment and insight. Alert and oriented x 3. Normal mood and appropriate affect.   Data Reviewed: I have personally reviewed following labs and imaging studies  CBC:  Recent Labs Lab 04/02/16 1446 04/03/16 0504 04/04/16 0457  WBC 17.6* 17.6* 16.9*  NEUTROABS 13.3*  --   --   HGB 7.4* 7.0* 7.2*  HCT  22.9* 22.0* 22.9*  MCV 111.2* 111.7* 112.3*  PLT 405* 393 0000000*   Basic Metabolic Panel:  Recent Labs Lab 04/02/16 1446 04/03/16 0504 04/04/16 0457  NA 134* 138 133*  K 4.2 3.4* 3.9  CL 102 99* 96*  CO2 26 30 30   GLUCOSE 94 105* 120*  BUN 27* 17 22*  CREATININE 1.10* 0.82 1.11*  CALCIUM 8.7* 8.7* 8.4*  MG  --  1.7  --    GFR: Estimated Creatinine Clearance: 47.3 mL/min (by C-G formula based on SCr of 1.11 mg/dL (H)). Liver Function Tests:  Recent Labs Lab 04/02/16 1446  AST 20  ALT 16  ALKPHOS 83  BILITOT 1.7*  PROT 7.1  ALBUMIN 3.5    Recent Labs Lab 04/02/16 1446  LIPASE 23    Recent Labs Lab 04/02/16 1447  AMMONIA 23   Coagulation Profile: No results for input(s): INR, PROTIME in the last 168 hours. Cardiac Enzymes: No results for input(s): CKTOTAL, CKMB, CKMBINDEX, TROPONINI in the last 168 hours. BNP (last 3 results) No results for input(s): PROBNP in the last 8760 hours. HbA1C: No results for input(s): HGBA1C in the last 72 hours. CBG: No results for input(s): GLUCAP  in the last 168 hours. Lipid Profile: No results for input(s): CHOL, HDL, LDLCALC, TRIG, CHOLHDL, LDLDIRECT in the last 72 hours. Thyroid Function Tests: No results for input(s): TSH, T4TOTAL, FREET4, T3FREE, THYROIDAB in the last 72 hours. Anemia Panel: No results for input(s): VITAMINB12, FOLATE, FERRITIN, TIBC, IRON, RETICCTPCT in the last 72 hours. Sepsis Labs: No results for input(s): PROCALCITON, LATICACIDVEN in the last 168 hours.  Recent Results (from the past 240 hour(s))  Culture, blood (routine x 2) Call MD if unable to obtain prior to antibiotics being given     Status: None (Preliminary result)   Collection Time: 04/02/16  6:45 PM  Result Value Ref Range Status   Specimen Description BLOOD RIGHT ARM  Final   Special Requests   Final    BOTTLES DRAWN AEROBIC AND ANAEROBIC BLUE 10CC [place]   Culture   Final    NO GROWTH 2 DAYS Performed at [facility]     Report Status PENDING  Incomplete  Culture, blood (routine x 2) Call MD if unable to obtain prior to antibiotics being given     Status: None (Preliminary result)   Collection Time: 04/02/16  6:55 PM  Result Value Ref Range Status   Specimen Description BLOOD LEFT HAND  Final   Special Requests BOTTLES DRAWN AEROBIC AND ANAEROBIC 10CC  Final   Culture   Final    NO GROWTH 2 DAYS Performed at [facility]    Report Status PENDING  Incomplete  Culture, sputum-assessment     Status: None   Collection Time: 04/03/16 10:57 AM  Result Value Ref Range Status   Specimen Description SPUTUM  Final   Special Requests Normal  Final   Sputum evaluation   Final    THIS SPECIMEN IS ACCEPTABLE. RESPIRATORY CULTURE REPORT TO FOLLOW.   Report Status 04/03/2016 FINAL  Final  Culture, respiratory (NON-Expectorated)     Status: None (Preliminary result)   Collection Time: 04/03/16 10:57 AM  Result Value Ref Range Status   Specimen Description SPUTUM  Final   Special Requests NONE  Final   Gram Stain   Final    ABUNDANT WBC PRESENT, PREDOMINANTLY PMN FEW GRAM POSITIVE RODS RARE GRAM POSITIVE COCCI IN PAIRS RARE GRAM NEGATIVE RODS    Culture   Final    CULTURE REINCUBATED FOR BETTER GROWTH Performed at [facility]    Report Status PENDING  Incomplete    Radiology Studies: Dg Chest 2 View  Result Date: 04/03/2016 CLINICAL DATA:  Cough and wheezing EXAM: CHEST  2 VIEW COMPARISON:  04/02/2016, 03/14/2016, 07/06/2014 FINDINGS: Stable enlargement of the cardiomediastinal with central vascular congestion. Mild diffuse interstitial prominence suggests mild edema. No consolidation or effusion. No pneumothorax. Asymmetrically dense left hilum. IMPRESSION: 1. Cardiomegaly with central vascular congestion and mild interstitial edema 2. Asymmetrically dense left hilum as before, CT scan was previously recommended for further evaluation. Electronically Signed   By: Donavan Foil M.D.   On:  04/03/2016 19:43   Scheduled Meds: . acidophilus  1 capsule Oral Daily  . apixaban  5 mg Oral BID  . azithromycin  500 mg Oral Q24H  . bisoprolol  5 mg Oral BID  . calcium-vitamin D  1 tablet Oral BID  . cefTRIAXone (ROCEPHIN)  IV  1 g Intravenous Q24H  . cholecalciferol  1,000 Units Oral BID  . clonazePAM  1 mg Oral QHS  . deferasirox  1,500 mg Oral QAC supper  . diltiazem  240 mg Oral Daily  .  escitalopram  10 mg Oral QHS  . fluticasone  1 spray Each Nare Daily  . fluticasone furoate-vilanterol  1 puff Inhalation Daily  . folic acid  1 mg Oral Daily  . furosemide  20 mg Intravenous BID  . loratadine  10 mg Oral Daily  . pantoprazole  40 mg Oral Daily  . potassium chloride SA  40 mEq Oral 2 times per day  . raloxifene  60 mg Oral Daily  . sodium chloride flush  3 mL Intravenous Q12H   Continuous Infusions:   LOS: 2 days   Kerney Elbe, DO [facility] Pager [phone]  If 7PM-7AM, please contact night-coverage [email or web address] Password [email or web address] 04/04/2016, 9:00 PM

## 2016-04-05 ENCOUNTER — Inpatient Hospital Stay (HOSPITAL_COMMUNITY): Payer: Medicare Other

## 2016-04-05 DIAGNOSIS — J189 Pneumonia, unspecified organism: Secondary | ICD-10-CM

## 2016-04-05 DIAGNOSIS — D649 Anemia, unspecified: Secondary | ICD-10-CM

## 2016-04-05 DIAGNOSIS — I10 Essential (primary) hypertension: Secondary | ICD-10-CM

## 2016-04-05 DIAGNOSIS — M549 Dorsalgia, unspecified: Secondary | ICD-10-CM

## 2016-04-05 LAB — CBC WITH DIFFERENTIAL/PLATELET
BASOS ABS: 0 10*3/uL (ref 0.0–0.1)
Basophils Relative: 0 %
EOS ABS: 0.8 10*3/uL — AB (ref 0.0–0.7)
Eosinophils Relative: 5 %
HEMATOCRIT: 23.3 % — AB (ref 36.0–46.0)
Hemoglobin: 7.2 g/dL — ABNORMAL LOW (ref 12.0–15.0)
LYMPHS ABS: 2.1 10*3/uL (ref 0.7–4.0)
Lymphocytes Relative: 14 %
MCH: 34.3 pg — ABNORMAL HIGH (ref 26.0–34.0)
MCHC: 30.9 g/dL (ref 30.0–36.0)
MCV: 111 fL — ABNORMAL HIGH (ref 78.0–100.0)
MONO ABS: 2.4 10*3/uL — AB (ref 0.1–1.0)
MONOS PCT: 16 %
NEUTROS ABS: 10 10*3/uL — AB (ref 1.7–7.7)
Neutrophils Relative %: 65 %
PLATELETS: 458 10*3/uL — AB (ref 150–400)
RBC: 2.1 MIL/uL — AB (ref 3.87–5.11)
RDW: 16.1 % — AB (ref 11.5–15.5)
WBC: 15.3 10*3/uL — AB (ref 4.0–10.5)

## 2016-04-05 LAB — COMPREHENSIVE METABOLIC PANEL
ALBUMIN: 3.2 g/dL — AB (ref 3.5–5.0)
ALT: 11 U/L — ABNORMAL LOW (ref 14–54)
ANION GAP: 8 (ref 5–15)
AST: 15 U/L (ref 15–41)
Alkaline Phosphatase: 73 U/L (ref 38–126)
BILIRUBIN TOTAL: 1.6 mg/dL — AB (ref 0.3–1.2)
BUN: 20 mg/dL (ref 6–20)
CHLORIDE: 97 mmol/L — AB (ref 101–111)
CO2: 31 mmol/L (ref 22–32)
Calcium: 8.6 mg/dL — ABNORMAL LOW (ref 8.9–10.3)
Creatinine, Ser: 0.97 mg/dL (ref 0.44–1.00)
GFR calc Af Amer: >60 mL/min (ref >60)
GFR calc non Af Amer: 59 mL/min — ABNORMAL LOW (ref >60)
GLUCOSE: 120 mg/dL — AB (ref 65–99)
POTASSIUM: 3.6 mmol/L (ref 3.5–5.1)
Sodium: 136 mmol/L (ref 135–145)
TOTAL PROTEIN: 6.5 g/dL (ref 6.5–8.1)

## 2016-04-05 LAB — LEGIONELLA PNEUMOPHILA SEROGP 1 UR AG: L. pneumophila Serogp 1 Ur Ag: NEGATIVE

## 2016-04-05 LAB — RETICULOCYTES: RBC.: 2.2 MIL/uL — AB (ref 3.87–5.11)

## 2016-04-05 LAB — MAGNESIUM: MAGNESIUM: 1.7 mg/dL (ref 1.7–2.4)

## 2016-04-05 LAB — PHOSPHORUS: Phosphorus: 2.7 mg/dL (ref 2.5–4.6)

## 2016-04-05 MED ORDER — HYDROMORPHONE HCL 1 MG/ML IJ SOLN
1.0000 mg | INTRAMUSCULAR | Status: DC | PRN
  Administered 2016-04-05 – 2016-04-07 (×7): 1 mg via INTRAVENOUS
  Filled 2016-04-05 (×8): qty 1

## 2016-04-05 MED ORDER — HYDROMORPHONE HCL 1 MG/ML IJ SOLN
0.5000 mg | INTRAMUSCULAR | Status: DC | PRN
  Administered 2016-04-05 (×2): 0.5 mg via INTRAVENOUS
  Filled 2016-04-05 (×2): qty 1

## 2016-04-05 MED ORDER — TRAMADOL HCL 50 MG PO TABS
50.0000 mg | ORAL_TABLET | Freq: Four times a day (QID) | ORAL | Status: DC | PRN
  Administered 2016-04-05 – 2016-04-07 (×5): 50 mg via ORAL
  Filled 2016-04-05 (×5): qty 1

## 2016-04-05 MED ORDER — BISACODYL 10 MG RE SUPP
10.0000 mg | Freq: Every day | RECTAL | Status: DC | PRN
  Administered 2016-04-06: 10 mg via RECTAL
  Filled 2016-04-05: qty 1

## 2016-04-05 NOTE — Progress Notes (Signed)
Pt with pain 8/10 in back and 10/10 to abdomin. Pt states that previously doses of dilaudid provided most relief. Offered the pt PRN dose of morphine and ultram at this time however pt is refusing stating that morphine is ineffective and ultram is a home medication. Will contact on call NP about analgesics at this time.

## 2016-04-05 NOTE — Progress Notes (Signed)
PT Cancellation Note  Patient Details Name: ARIONA KULMAN MRN: [id number] DOB: 08-30-1950   Cancelled Treatment:     patient in a lot of pain.  Nursing in room notified. Will attempt again as schedule permits.    [place], [place] [facility] [phone]  Rica Koyanagi  PTA [facility]  Acute  Rehab Pager      [phone]
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
## 2016-04-05 NOTE — Progress Notes (Signed)
IP PROGRESS NOTE  Subjective:   [name] is well-known to me with a history of pyruvate kinase deficiency and chronic anemia. She has secondary iron overload and has been maintained on xjade chronically.  Her family called to let me know she had been admitted.  She was admitted on 04/02/2016 with a cough and back pain. She was diagnosed with pneumonia. She reports the cough has improved, but she continues to have severe pain in the lower back radiating to the abdomen. The pain is centered to the left of midline. Pain limits her ability to ambulate. No leg weakness or numbness.  Objective: Vital signs in last 24 hours: Blood pressure 128/64, pulse 66, temperature 98.6 F (37 C), temperature source Oral, resp. rate 20, height 5\' 2"  (1.575 m), weight 169 lb 4.8 oz (76.8 kg), SpO2 95 %.  Intake/Output from previous day: 11/08 0701 - 11/09 0700 In: 530 [P.O.:480; IV Piggyback:50] Out: 800 [Urine:800]  Physical Exam:  HEENT: No thrush Lungs: Inspiratory Rhonchi at the right upper posterior chest and left base, no respiratory distress Cardiac: Regular rate and rhythm Abdomen: No hepatomegaly, nontender, no mass Extremities: No leg edema Musculoskeletal: There is tenderness at the lower thoracic/upper lumbar spine and in the paraspinous region. No mass.    Lab Results:  Recent Labs  04/04/16 0457 04/05/16 0443  WBC 16.9* 15.3*  HGB 7.2* 7.2*  HCT 22.9* 23.3*  PLT 420* 458*    BMET  Recent Labs  04/04/16 0457 04/05/16 0443  NA 133* 136  K 3.9 3.6  CL 96* 97*  CO2 30 31  GLUCOSE 120* 120*  BUN 22* 20  CREATININE 1.11* 0.97  CALCIUM 8.4* 8.6*    Studies/Results: Dg Chest 2 View  Result Date: 04/03/2016 CLINICAL DATA:  Cough and wheezing EXAM: CHEST  2 VIEW COMPARISON:  04/02/2016, 03/14/2016, 07/06/2014 FINDINGS: Stable enlargement of the cardiomediastinal with central vascular congestion. Mild diffuse interstitial prominence suggests mild edema. No consolidation  or effusion. No pneumothorax. Asymmetrically dense left hilum. IMPRESSION: 1. Cardiomegaly with central vascular congestion and mild interstitial edema 2. Asymmetrically dense left hilum as before, CT scan was previously recommended for further evaluation. Electronically Signed   By: Donavan Foil M.D.   On: 04/03/2016 19:43    Medications: I have reviewed the patient's current medications.  Assessment/Plan:  1. Hereditary pyruvate kinase deficiency, chronic anemia 2. 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 History of atrial fibrillation/flutter, status post a Maze procedure 4. History of mitral regurgitation, status post mitral valve repair by Dr. [name] in February 2010 5. 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 History of right diaphragm paralysis 7. Admission 04/02/2016 with left lung pneumonia 8. Severe back pain-etiology unclear  The anemia is slightly below baseline, likely related to the acute infection. I will check a reticulocyte count.  The etiology of the severe back pain is unclear. This may be pleurisy related to the pneumonia. I reviewed the CT in radiology. There is no evidence of a compression fracture, rib fracture, hematoma, or other acute process affecting the spine or chest wall.  Recommendations: 1. Check reticulocyte count 2. Continue antibiotics per internal medicine 3. Narcotic analgesics as needed for pain 4. MRI low thoracic/lumbar spine for persistent pain 5. Outpatient follow-up in the hematology clinic, scheduled for 04/11/2016-we will move this appointment out    LOS: 3 days   [name], MD   04/05/2016, 2:19 PM

## 2016-04-06 LAB — BASIC METABOLIC PANEL
ANION GAP: 9 (ref 5–15)
BUN: 15 mg/dL (ref 6–20)
CALCIUM: 8.5 mg/dL — AB (ref 8.9–10.3)
CO2: 32 mmol/L (ref 22–32)
Chloride: 92 mmol/L — ABNORMAL LOW (ref 101–111)
Creatinine, Ser: 0.98 mg/dL (ref 0.44–1.00)
GFR calc non Af Amer: 58 mL/min — ABNORMAL LOW (ref >60)
GLUCOSE: 104 mg/dL — AB (ref 65–99)
POTASSIUM: 4.4 mmol/L (ref 3.5–5.1)
Sodium: 133 mmol/L — ABNORMAL LOW (ref 135–145)

## 2016-04-06 LAB — CBC WITH DIFFERENTIAL/PLATELET
BASOS ABS: 0.1 10*3/uL (ref 0.0–0.1)
BASOS PCT: 0 %
Eosinophils Absolute: 0.9 10*3/uL — ABNORMAL HIGH (ref 0.0–0.7)
Eosinophils Relative: 7 %
HEMATOCRIT: 23.6 % — AB (ref 36.0–46.0)
HEMOGLOBIN: 7.3 g/dL — AB (ref 12.0–15.0)
LYMPHS PCT: 20 %
Lymphs Abs: 2.5 10*3/uL (ref 0.7–4.0)
MCH: 33.8 pg (ref 26.0–34.0)
MCHC: 30.9 g/dL (ref 30.0–36.0)
MCV: 109.3 fL — AB (ref 78.0–100.0)
MONOS PCT: 20 %
Monocytes Absolute: 2.5 10*3/uL — ABNORMAL HIGH (ref 0.1–1.0)
NEUTROS ABS: 6.6 10*3/uL (ref 1.7–7.7)
NEUTROS PCT: 53 %
Platelets: 502 10*3/uL — ABNORMAL HIGH (ref 150–400)
RBC: 2.16 MIL/uL — ABNORMAL LOW (ref 3.87–5.11)
RDW: 15.8 % — ABNORMAL HIGH (ref 11.5–15.5)
WBC: 12.5 10*3/uL — ABNORMAL HIGH (ref 4.0–10.5)

## 2016-04-06 LAB — CULTURE, RESPIRATORY: CULTURE: NORMAL

## 2016-04-06 LAB — CULTURE, RESPIRATORY W GRAM STAIN

## 2016-04-06 MED ORDER — TORSEMIDE 20 MG PO TABS
80.0000 mg | ORAL_TABLET | Freq: Every day | ORAL | Status: DC
  Administered 2016-04-07: 80 mg via ORAL
  Filled 2016-04-06 (×3): qty 4

## 2016-04-06 MED ORDER — LIDOCAINE 5 % EX PTCH
1.0000 | MEDICATED_PATCH | CUTANEOUS | Status: DC
  Administered 2016-04-06 – 2016-04-08 (×3): 1 via TRANSDERMAL
  Filled 2016-04-06 (×3): qty 1

## 2016-04-06 MED ORDER — LORAZEPAM 2 MG/ML IJ SOLN
0.5000 mg | Freq: Once | INTRAMUSCULAR | Status: AC
  Administered 2016-04-07: 0.5 mg via INTRAVENOUS
  Filled 2016-04-06: qty 1

## 2016-04-06 MED ORDER — CEFPODOXIME PROXETIL 200 MG PO TABS
200.0000 mg | ORAL_TABLET | Freq: Two times a day (BID) | ORAL | Status: DC
  Administered 2016-04-06 – 2016-04-08 (×5): 200 mg via ORAL
  Filled 2016-04-06 (×5): qty 1
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
## 2016-04-06 NOTE — Progress Notes (Signed)
Physical Therapy Treatment Patient Details Name: [name] MRN: [id number] DOB: Oct 01, 1950 Today's Date: 04/06/2016    History of Present Illness [name]  is a 65 y.o. female, With history of chronic diastolic CHF, asthma, pulmonary hypertension with chronic respiratory failure on 2 L home O2, A. fib status post RFA and Maze procedure, on anticoagulation, pyruvate kinase deficiency hemolytic anemia, mitral regurgitation status post repair who was brought to the ED with family with one week off mid to lower left back pain. This was acute and over the next few days became progressive having difficulty ambulating.  Pt found to have PNA.    PT Comments    Pt ambulated good distance in hallway however pain reported 8/10 (improved since past sessions).  Follow Up Recommendations  No PT follow up     Equipment Recommendations  None recommended by PT    Recommendations for Other Services       Precautions / Restrictions Precautions Precautions: Fall Precaution Comments: chronic O2    Mobility  Bed Mobility Overal bed mobility: Needs Assistance Bed Mobility: Rolling;Sidelying to Sit;Sit to Sidelying Rolling: Supervision Sidelying to sit: Supervision     Sit to sidelying: Supervision General bed mobility comments: verbal cues for log roll technique, pt able to perform slowly but well  Transfers Overall transfer level: Needs assistance Equipment used: Rolling walker (2 wheeled) Transfers: Sit to/from Stand Sit to Stand: Min guard         General transfer comment: verbal cues for safe technique  Ambulation/Gait Ambulation/Gait assistance: Min guard Ambulation Distance (Feet): 400 Feet Assistive device: Rolling walker (2 wheeled) Gait Pattern/deviations: Step-through pattern;Decreased stride length     General Gait Details: able to tolerate increased distance today, distance to pt tolerance, remained on 2L O2 [place]   Stairs            Wheelchair  Mobility    Modified Rankin (Stroke Patients Only)       Balance                                    Cognition Arousal/Alertness: Awake/alert Behavior During Therapy: WFL for tasks assessed/performed Overall Cognitive Status: Within Functional Limits for tasks assessed                      Exercises      General Comments        Pertinent Vitals/Pain Pain Assessment: 0-10 Pain Score: 8  Pain Location: left low back Pain Descriptors / Indicators: Aching;Constant Pain Intervention(s): Limited activity within patient's tolerance;Monitored during session;Repositioned;Premedicated before session    Home Living                      Prior Function            PT Goals (current goals can now be found in the care plan section) Progress towards PT goals: Progressing toward goals    Frequency    Min 3X/week      PT Plan Current plan remains appropriate    Co-evaluation             End of Session Equipment Utilized During Treatment: Gait belt;Oxygen Activity Tolerance: Patient tolerated treatment well Patient left: with call bell/phone within reach;with family/visitor present;in bed     Time: [id number] PT Time Calculation (min) (ACUTE ONLY): 22 min  Charges:  $Gait Training: 8-22 mins  G Codes:      [name],KATHrine E 2016-04-10, 1:27 PM Carmelia Bake, PT, DPT April 10, 2016  Pager: [phone]

## 2016-04-06 NOTE — Care Management Important Message (Signed)
Important Message  Patient Details  Name: [name] MRN: [id number] Date of Birth: [birth year]   Medicare Important Message Given:  Yes    Camillo Flaming 04/06/2016, 12:09 [place] Message  Patient Details  Name: [name] MRN: [id number] Date of Birth: 1950/08/28   Medicare Important Message Given:  Yes    Camillo Flaming 04/06/2016, 12:09 PM

## 2016-04-06 NOTE — Care Management Note (Signed)
Case Management Note  Patient Details  Name: [name] MRN: [id number] Date of Birth: [birth year]  Subjective/Objective:  Patient w/many questions about meds, treatment plan, & specialists involved in her care-reminded patient while the interdisplinary team was in the rm during rounds the doctor explained & listened to all of her concerns & addressed every need-she did voice understanding-MD contacted to just fyi patient brought up issues again.Recc HHRN-disease mgmnt, meds instruction-patient provided w/HHc agency list for choice-patient chose [facility] rep Santiago Glad aware & following. Await HHRN order.                  Action/Plan:d/c home w/HHC.   Expected Discharge Date:   (unknown)               Expected Discharge Plan:  [place]  In-House Referral:     Discharge planning Services     Post Acute Care Choice:    Choice offered to:  Patient  DME Arranged:    DME Agency:     HH Arranged:    [place] Agency:  [place]  Status of Service:  In process, will continue to follow  If discussed at Long Length of Stay Meetings, dates discussed:    Additional Comments:  Dessa Phi, RN 04/06/2016, 12:49 PM

## 2016-04-07 ENCOUNTER — Inpatient Hospital Stay (HOSPITAL_COMMUNITY): Payer: Medicare Other

## 2016-04-07 ENCOUNTER — Other Ambulatory Visit (HOSPITAL_COMMUNITY): Payer: Medicare Other

## 2016-04-07 LAB — CBC WITH DIFFERENTIAL/PLATELET
BASOS ABS: 0 10*3/uL (ref 0.0–0.1)
Basophils Relative: 0 %
EOS PCT: 7 %
Eosinophils Absolute: 0.9 10*3/uL — ABNORMAL HIGH (ref 0.0–0.7)
HEMATOCRIT: 24.2 % — AB (ref 36.0–46.0)
Hemoglobin: 7.6 g/dL — ABNORMAL LOW (ref 12.0–15.0)
LYMPHS ABS: 2.1 10*3/uL (ref 0.7–4.0)
LYMPHS PCT: 18 %
MCH: 34.5 pg — AB (ref 26.0–34.0)
MCHC: 31.4 g/dL (ref 30.0–36.0)
MCV: 110 fL — AB (ref 78.0–100.0)
MONO ABS: 2.4 10*3/uL — AB (ref 0.1–1.0)
MONOS PCT: 20 %
NEUTROS ABS: 6.3 10*3/uL (ref 1.7–7.7)
Neutrophils Relative %: 55 %
PLATELETS: 555 10*3/uL — AB (ref 150–400)
RBC: 2.2 MIL/uL — ABNORMAL LOW (ref 3.87–5.11)
RDW: 16.3 % — AB (ref 11.5–15.5)
WBC: 11.7 10*3/uL — ABNORMAL HIGH (ref 4.0–10.5)

## 2016-04-07 LAB — COMPREHENSIVE METABOLIC PANEL
ALK PHOS: 75 U/L (ref 38–126)
ALT: 11 U/L — AB (ref 14–54)
AST: 15 U/L (ref 15–41)
Albumin: 3.1 g/dL — ABNORMAL LOW (ref 3.5–5.0)
Anion gap: 8 (ref 5–15)
BILIRUBIN TOTAL: 1.4 mg/dL — AB (ref 0.3–1.2)
BUN: 17 mg/dL (ref 6–20)
CALCIUM: 9.1 mg/dL (ref 8.9–10.3)
CO2: 29 mmol/L (ref 22–32)
CREATININE: 0.99 mg/dL (ref 0.44–1.00)
Chloride: 96 mmol/L — ABNORMAL LOW (ref 101–111)
GFR calc non Af Amer: 58 mL/min — ABNORMAL LOW (ref >60)
GLUCOSE: 104 mg/dL — AB (ref 65–99)
Potassium: 5 mmol/L (ref 3.5–5.1)
SODIUM: 133 mmol/L — AB (ref 135–145)
TOTAL PROTEIN: 6.5 g/dL (ref 6.5–8.1)

## 2016-04-07 LAB — CULTURE, BLOOD (ROUTINE X 2)
CULTURE: NO GROWTH
CULTURE: NO GROWTH

## 2016-04-07 LAB — PHOSPHORUS: Phosphorus: 3.8 mg/dL (ref 2.5–4.6)

## 2016-04-07 LAB — MAGNESIUM: Magnesium: 1.7 mg/dL (ref 1.7–2.4)

## 2016-04-07 LAB — OCCULT BLOOD X 1 CARD TO LAB, STOOL: Fecal Occult Bld: NEGATIVE

## 2016-04-07 MED ORDER — OXYCODONE-ACETAMINOPHEN 5-325 MG PO TABS
1.0000 | ORAL_TABLET | Freq: Four times a day (QID) | ORAL | Status: DC | PRN
  Administered 2016-04-07 – 2016-04-08 (×4): 1 via ORAL
  Filled 2016-04-07 (×4): qty 1
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
## 2016-04-08 LAB — CBC WITH DIFFERENTIAL/PLATELET
BASOS ABS: 0 10*3/uL (ref 0.0–0.1)
Basophils Relative: 0 %
EOS ABS: 0.8 10*3/uL — AB (ref 0.0–0.7)
Eosinophils Relative: 8 %
HCT: 22.9 % — ABNORMAL LOW (ref 36.0–46.0)
HEMOGLOBIN: 7.2 g/dL — AB (ref 12.0–15.0)
LYMPHS ABS: 1.9 10*3/uL (ref 0.7–4.0)
Lymphocytes Relative: 18 %
MCH: 34.1 pg — AB (ref 26.0–34.0)
MCHC: 31.4 g/dL (ref 30.0–36.0)
MCV: 108.5 fL — ABNORMAL HIGH (ref 78.0–100.0)
Monocytes Absolute: 1.7 10*3/uL — ABNORMAL HIGH (ref 0.1–1.0)
Monocytes Relative: 16 %
NEUTROS PCT: 58 %
Neutro Abs: 6.5 10*3/uL (ref 1.7–7.7)
Platelets: 546 10*3/uL — ABNORMAL HIGH (ref 150–400)
RBC: 2.11 MIL/uL — AB (ref 3.87–5.11)
RDW: 15.9 % — ABNORMAL HIGH (ref 11.5–15.5)
WBC: 11 10*3/uL — AB (ref 4.0–10.5)

## 2016-04-08 LAB — BASIC METABOLIC PANEL
ANION GAP: 9 (ref 5–15)
BUN: 21 mg/dL — ABNORMAL HIGH (ref 6–20)
CALCIUM: 8.6 mg/dL — AB (ref 8.9–10.3)
CO2: 31 mmol/L (ref 22–32)
Chloride: 93 mmol/L — ABNORMAL LOW (ref 101–111)
Creatinine, Ser: 1.25 mg/dL — ABNORMAL HIGH (ref 0.44–1.00)
GFR, EST AFRICAN AMERICAN: 50 mL/min — AB (ref >60)
GFR, EST NON AFRICAN AMERICAN: 43 mL/min — AB (ref >60)
GLUCOSE: 101 mg/dL — AB (ref 65–99)
POTASSIUM: 4.4 mmol/L (ref 3.5–5.1)
SODIUM: 133 mmol/L — AB (ref 135–145)

## 2016-04-08 LAB — PHOSPHORUS: PHOSPHORUS: 3.8 mg/dL (ref 2.5–4.6)

## 2016-04-08 LAB — MAGNESIUM: MAGNESIUM: 1.5 mg/dL — AB (ref 1.7–2.4)

## 2016-04-08 MED ORDER — POTASSIUM CHLORIDE CRYS ER 20 MEQ PO TBCR: 40.0000 meq | EXTENDED_RELEASE_TABLET | Freq: Every day | ORAL | 0 refills | Status: DC

## 2016-04-08 MED ORDER — METHOCARBAMOL 750 MG PO TABS: 750.0000 mg | ORAL_TABLET | Freq: Four times a day (QID) | ORAL | 0 refills | Status: DC | PRN

## 2016-04-08 MED ORDER — OXYCODONE-ACETAMINOPHEN 5-325 MG PO TABS: 1.0000 | ORAL_TABLET | Freq: Four times a day (QID) | ORAL | 0 refills | Status: DC | PRN

## 2016-04-08 MED ORDER — CEFPODOXIME PROXETIL 200 MG PO TABS: 200.0000 mg | ORAL_TABLET | Freq: Two times a day (BID) | ORAL | 0 refills | Status: DC

## 2016-04-08 MED ORDER — SENNOSIDES-DOCUSATE SODIUM 8.6-50 MG PO TABS: 1.0000 | ORAL_TABLET | Freq: Every evening | ORAL | 0 refills | Status: AC | PRN

## 2016-04-08 MED ORDER — LIDOCAINE 5 % EX PTCH: 1.0000 | MEDICATED_PATCH | CUTANEOUS | 0 refills | Status: DC

## 2016-04-08 MED ORDER — DICLOFENAC SODIUM 1 % TD GEL
2.0000 g | Freq: Four times a day (QID) | TRANSDERMAL | Status: DC
  Administered 2016-04-08: 2 g via TOPICAL
  Filled 2016-04-08: qty 100

## 2016-04-08 MED ORDER — TORSEMIDE 20 MG PO TABS
40.0000 mg | ORAL_TABLET | Freq: Every day | ORAL | Status: DC
  Administered 2016-04-08: 40 mg via ORAL
  Filled 2016-04-08: qty 2

## 2016-04-08 MED ORDER — TORSEMIDE 20 MG PO TABS: 40.0000 mg | ORAL_TABLET | Freq: Every day | ORAL | 0 refills | Status: DC

## 2016-04-08 MED ORDER — AZITHROMYCIN 250 MG PO TABS: ORAL_TABLET | ORAL | 0 refills | Status: DC

## 2016-04-08 MED ORDER — GUAIFENESIN-DM 100-10 MG/5ML PO SYRP: 5.0000 mL | ORAL_SOLUTION | ORAL | 0 refills | Status: DC | PRN

## 2016-04-08 MED ORDER — DICLOFENAC SODIUM 1 % TD GEL: 2.0000 g | Freq: Four times a day (QID) | TRANSDERMAL | 0 refills | Status: AC | PRN

## 2016-04-08 NOTE — Discharge Summary (Signed)
Physician Discharge Summary  [name] [id number] DOB: Nov 18, 1950 DOA: 04/02/2016  PCP: Henrine Screws, MD  Admit date: 04/02/2016 Discharge date: 04/08/2016  Admitted From: Home Disposition:  Home with [place]  Recommendations for Outpatient Follow-up:  1. Follow up with PCP Dr. Inda Merlin within the next week 2. Have Repeat CXR in 2-3 weeks to evaluate Pneumonia 3. Follow up with Dr. Haroldine Laws in Cardiology in 1 week 4. Follow up with Dr. Lamonte Sakai in Pulmonology in 1 week 5. Follow up with Dr. Benay Spice in Hematology/Oncology in 1 week 6. Repeat MRI as an outpatient if not improving as it was releated to DJD r/o Discitis/Osteomyelitis 7. Seek Referral to a Pain Clinic to manage chronic Pain 8. Please obtain BMP/CBC in one week  Home Health: YES (NURSING) Equipment/Devices: No  Discharge Condition: Stable CODE STATUS: Stable Diet recommendation: Heart Healthy   Brief/Interim Summary: [name] a 65 y.o.female,With history of chronic diastolic CHF, asthma, pulmonary hypertension with chronic respiratory failure on 2 L home O2, A. fib status post RFA and Maze procedure, on anticoagulation, pyruvate kinase deficiency hemolytic anemia, mitral regurgitation status post repair who was brought to the ED with family with one week off mid to lower left back pain. Found to have left lower lobe pneumonia with small parapneumonic pleural effusion. Also some cardiomegaly with mild interstitial pulmonary edema. Patient admitted for IV antibiotic and pain management for abdominal pain and back spasm as well as acute Diastolic Heart Failure. CT of the abdomen and pelvis did not reveal an causes for back pain and MRI of Thoracic and Lumbar spine orderd upon Dr. Gearldine Shown recc's showed anterior edema in front of T11 and T12. Likely it was related to DJD but Early Discitis/Osteomylelitis could not be r/o'd so Infectious Disease Dr. Megan Salon as well as Interventional Radiology  Dr. Kathlene Cote were called and reviewed the results and felt strongly that it was not Discitis/Osteomyelitis. Patient is to repeat MRI as an outpatient if worsens. Lidocaine Patch and Voltarn gel were started for Back Pain/Spasm as well as Oxycodone. Patient's Pneumonia and Heart Failure Steadily improved and patient was transitioned to po Abx and Diuresis. She was told to follow up with PCP and specialists and will call to make an appointment. Back pain was manageable and patient was ready to be D/C'd Home today. Home Health Nursing was set up for Medication teaching and administration.    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 -WBC decreased -Per radiology recommends follow-up PA and lateral chest x-ray in 2-3 weeks to ensure resolution and rule out underlying neoplasm. -Follow up with PCP and with Pulmonology Dr. Lamonte Sakai  Acute on chronic diastolic CHF, improving -Last echo on 2016 show EF of 50% to [id number] grade 2 diastolic dysfunction -Was not compliant with Lasix because of difficulty going to the bathroom -D/C'd Laisx 20 mg IV BID and  switched to po Torsemide today. Continue to Monitor Volume Status -Monitor BMP for kidney function and electrolyte disturbances as am out Patient -Strict I&O's and daily  weight -Continue Imdur and beta blocker -ECHO done and showed Systolic function wasnormal. The estimated ejection fraction was in the range of 55%to 60%. Wall motion was normal; there were no regional wallmotion abnormalities. The study is not technically sufficient toallow evaluation of LV diastolic function. Doppler parameters areconsistent with high ventricular filling pressure. -Torsemide dose reduced because of Mild Creatine Bump -Follow up with Dr. Haroldine Laws in Cardiology   Mild AKI, -Patient's BUN/Cr went up slightly -Switched to po Diuresis yesterday; Will Cut Torsemide Dose in Half because of Cr -Continue to Monitor -Follow up BMP at Dr. Clayborne Dana office  Radiating Abdominal pain, acute, generalized - some history of IBS although patient having regular bowel movement, has improved since yesterday, unknown etiology at this time -CT abdomen pelvis x2 unremarkable. Lactic acid, LFTs and lipase normal. Unclear if this is radiation of her pain from left lower lobe of the lung or the back. Likely Pleursey -Pain control as needed -Follow up Imaging as an outpatient  Left mid-lower back pain. - Back spasm  -C/w Robaxin; ADDED Lidocain Patch, Oxycodone and Voltran gel -Pain medication as needed; Gave Dilaudid Once. Restarted Home Tramadol dose -Started patient on Oxycodone/Acetaminophen 1 tab po q6hprn for Moderate Pain and Voltran Gel. -Wound Care to Evaluate [facility] -Repeat CT Scan of Abdomen and Pelvis showed No acute bony abnormality noted. The remainder of the exam is stable from the prior study dated 04/02/2016. -MRI of Thoracic Spine and Lumbar Spine showed Ordinary mild degenerative changes. Edema within the anterior aspects of the T11 and T12 vertebral bodies that is favored to be discogenic secondary to degenerative change. However, early discitisosteomyelitis can have this appearance. If there is clinical concern, follow-up would be suggested. In the lumbar  region, there is ordinary mild degenerative disc disease and degenerative facet disease but no advanced pathology.Mild lateral recess stenosis at L4-5. -Discussed case with Infectious Diseases Dr. Megan Salon and with IR Dr. Kathlene Cote and feel that this is less likely discitis and osteomyelitis and they attribute the edema to DJD. **Discussed findings with the patient and she feels like if it worsens will have PCP repeat MRI -Per Dr. 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 -May need to Repeat MRI as an outpatient; Follow up with Dr. Inda Merlin about being sent to a pain specialist.   -PT has no recommendations for home follow up.   Obstructive sleep apnea -Continue nighttime CPAP.  Essential hypertension -Stable. 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  Asthma No acute symptoms. Continue albuterol inhaler and home Breo  Pulmonary HTN Follows with Dr. Lamonte Sakai. Continue po diuresis for now.  Hereditary Pyruvate kinase deficiency hemolytic anemia -Hemoglobin around baseline and patient states that if her Hb is around 8 its better than normal. Does not need transfusion at present. Follows with Dr. Learta Codding. States her WBC is usually elevated at 12K.  -Reticulocyte Count obtained -Outpatient Heme/Onc follow up with Dr. Benay Spice  Discharge Instructions  Discharge Instructions    Call MD for:  difficulty breathing, headache or visual disturbances    Complete by:  As directed    Call MD for:  persistant dizziness or light-headedness    Complete by:  As  directed    Call MD for:  persistant nausea and vomiting    Complete by:  As directed    Call MD for:  severe uncontrolled pain    Complete by:  As directed    Call MD for:  temperature >100.4    Complete by:  As directed    Diet - low sodium heart healthy    Complete  by:  As directed    Increase activity slowly    Complete by:  As directed        Medication List    STOP taking these medications   amoxicillin-clavulanate 875-125 MG tablet Commonly known as:  AUGMENTIN   predniSONE 10 MG tablet Commonly known as:  DELTASONE     TAKE these medications   AIRBORNE Chew Chew 1 tablet by mouth 2 (two) times daily.   albuterol 108 (90 Base) MCG/ACT inhaler Commonly known as:  PROVENTIL HFA;VENTOLIN HFA Inhale 2 puffs into the lungs every 6 (six) hours as needed for wheezing or shortness of breath.   ALIGN 4 MG Caps Take 1 tablet by mouth daily.   apixaban 5 MG Tabs tablet Commonly known as:  ELIQUIS Take 1 tablet (5 mg total) by mouth 2 (two) times daily.   azithromycin 250 MG tablet Commonly known as:  ZITHROMAX Take 500 mg po Daily until 04/10/2016   bisoprolol 5 MG tablet Commonly known as:  ZEBETA TAKE 1 TABLET(5 MG) BY MOUTH TWICE DAILY   BREO ELLIPTA 100-25 MCG/INH Aepb Generic drug:  fluticasone furoate-vilanterol INHALE 1 PUFF BY MOUTH EVERY DAY   calcium-vitamin D 500-200 MG-UNIT tablet Commonly known as:  OSCAL WITH D Take 1 tablet by mouth 2 (two) times daily.   CARTIA XT 240 MG 24 hr capsule Generic drug:  diltiazem TAKE 1 CAPSULE BY MOUTH DAILY   cefpodoxime 200 MG tablet Commonly known as:  VANTIN Take 1 tablet (200 mg total) by mouth every 12 (twelve) hours.   CHLORPHENIRAMINE MALEATE PO Take 4 mg by mouth at bedtime.   cholecalciferol 1000 units tablet Commonly known as:  VITAMIN D Take 1,000 Units by mouth 2 (two) times daily.   clonazePAM 1 MG tablet Commonly known as:  KLONOPIN Take 1-2 mg by mouth at bedtime.   deferasirox 500 MG disintegrating tablet Commonly known as:  EXJADE Take 3 tablets (1,500 mg total) by mouth daily before breakfast. Faxed to [facility] on 02/17/16   diclofenac sodium 1 % Gel Commonly known as:  VOLTAREN Apply 2 g topically 4 (four) times daily as needed.   EPINEPHrine  0.3 mg/0.3 mL Devi Commonly known as:  EPI-PEN Inject 0.3 mg into the muscle once as needed.   escitalopram 10 MG tablet Commonly known as:  LEXAPRO Take 10 mg by mouth daily.   fexofenadine 180 MG tablet Commonly known as:  ALLEGRA Take 180 mg by mouth daily.   fluticasone 50 MCG/ACT nasal spray Commonly known as:  FLONASE Place 1 spray into both nostrils daily.   folic acid 1 MG tablet Commonly known as:  FOLVITE Take 1 mg by mouth daily.   guaiFENesin-dextromethorphan 100-10 MG/5ML syrup Commonly known as:  ROBITUSSIN DM Take 5 mLs by mouth every 4 (four) hours as needed for cough.   lidocaine 5 % Commonly known as:  LIDODERM Place 1 patch onto the skin daily. Remove & Discard patch within 12 hours or as directed by MD Start taking on:  04/09/2016   methocarbamol 750 MG tablet Commonly known as:  ROBAXIN Take  1 tablet (750 mg total) by mouth every 6 (six) hours as needed for muscle spasms.   NON FORMULARY Inhale 2 L into the lungs continuous. Oxygen at 2 l/min [place]   oxyCODONE-acetaminophen 5-325 MG tablet Commonly known as:  PERCOCET/ROXICET Take 1 tablet by mouth every 6 (six) hours as needed for moderate pain. What changed:  when to take this  reasons to take this   pantoprazole 40 MG tablet Commonly known as:  PROTONIX Take 40 mg by mouth daily.   potassium chloride SA 20 MEQ tablet Commonly known as:  K-DUR,KLOR-CON Take 2 tablets (40 mEq total) by mouth daily. What changed:  See the new instructions.   raloxifene 60 MG tablet Commonly known as:  EVISTA Take 60 mg by mouth daily.   senna-docusate 8.6-50 MG tablet Commonly known as:  Senokot-S Take 1 tablet by mouth at bedtime as needed for mild constipation.   sodium chloride 0.65 % Soln nasal spray Commonly known as:  OCEAN Place 1 spray into both nostrils 3 (three) times daily as needed for congestion.   spironolactone 25 MG tablet Commonly known as:  ALDACTONE TAKE 1/2 TABLET(12.5 MG) BY  MOUTH DAILY   torsemide 20 MG tablet Commonly known as:  DEMADEX Take 2 tablets (40 mg total) by mouth daily. Start taking on:  04/09/2016 What changed:  See the new instructions.   traMADol 50 MG tablet Commonly known as:  ULTRAM Take 50 mg by mouth every 6 (six) hours as needed for moderate pain. 3-4 times daily   UNABLE TO FIND Med Name: CPAP      Follow-up Information    GATES,ROBERT NEVILL, MD Follow up in 1 week(s).   Specialty:  Internal Medicine Why:  Call to Make Appointment Contact information: [address] [place] [address] [phone]        Glori Bickers, MD Follow up in 1 week(s).   Specialty:  Cardiology Why:  Call to scheudle Appointment Contact information: [address] [place] [address] [phone]        Collene Gobble., MD Follow up in 1 week(s).   Specialty:  Pulmonary Disease Why:  Call to Make an Appointment Contact information: [address] [place] [address] [phone]        [name], MD Follow up in 1 week(s).   Specialty:  Oncology Why:  Call to schedule Appointment Contact information: [place] [address] [address] [place] Follow up.   Why:  Home Health RN Contact information: [place] [address] [phone]          Allergies  Allergen Reactions  . Iodine Anaphylaxis  . Shellfish Allergy Anaphylaxis  . Iohexol      Code: HIVES, Desc: reaction since childhood, Onset Date: [id number]   . Levaquin [Levofloxacin In D5w] Nausea And Vomiting    dizziness    Consultations:  Hematology/Oncology Dr. Benay Spice  Procedures/Studies: Ct Abdomen Pelvis Wo Contrast  Result Date: 04/05/2016 CLINICAL DATA:  New onset back pain EXAM: CT ABDOMEN AND PELVIS WITHOUT CONTRAST TECHNIQUE: Multidetector CT imaging of the abdomen and pelvis was performed following the standard protocol  without IV contrast. COMPARISON:  04/02/2016 FINDINGS: Lower chest: Bibasilar changes are noted similar to that seen on the prior CT examination. Hepatobiliary: No focal liver abnormality is seen. Status post cholecystectomy. No biliary dilatation. Pancreas: Unremarkable. No pancreatic ductal dilatation or surrounding inflammatory  changes. Spleen: Surgically removed Adrenals/Urinary Tract: The adrenal glands are within normal limits. Kidneys are well visualized bilaterally. No definitive renal calculi or obstructive changes are seen. The bladder is partially distended. Stomach/Bowel: The appendix is well visualized and within normal limits. Mild diverticular change of the sigmoid colon is noted without evidence of diverticulitis. No obstructive changes are noted. Vascular/Lymphatic: Aortic atherosclerosis. No enlarged abdominal or pelvic lymph nodes. Reproductive: Uterus and bilateral adnexa are unremarkable. Other: No ascites is noted. A fat containing paraumbilical hernia is seen. It is stable from the prior study. Musculoskeletal: Multilevel degenerative changes of the lumbar spine are seen. No acute compression deformity is seen. IMPRESSION: No acute bony abnormality noted. The remainder of the exam is stable from the prior study dated 04/02/2016. Electronically Signed   By: Inez Catalina M.D.   On: 04/05/2016 15:50   Ct Abdomen Pelvis Wo Contrast  Result Date: 04/02/2016 CLINICAL DATA:  65 year old female with history of shortness of breath, abdominal pain and low back pain. EXAM: CT CHEST, ABDOMEN AND PELVIS WITHOUT CONTRAST TECHNIQUE: Multidetector CT imaging of the chest, abdomen and pelvis was performed following the standard protocol without IV contrast. COMPARISON:  None. FINDINGS: CT CHEST FINDINGS Cardiovascular: Heart size is enlarged with biatrial dilatation. Status post mitral annuloplasty. There is no significant pericardial fluid, thickening or pericardial calcification. Mediastinum/Nodes:  Several borderline enlarged and mildly enlarged mediastinal lymph nodes are noted, measuring up to 12 mm in short axis in the prevascular nodal stations. Esophagus is unremarkable in appearance. No axillary lymphadenopathy. Lungs/Pleura: Diffuse interlobular septal thickening and mild ground-glass attenuation, suggesting a background of mild interstitial pulmonary edema. There is a more focal area of mass-like airspace consolidation in the superior segment of the left lower lobe, concerning for pneumonia. Small left pleural effusion lying dependently. Musculoskeletal: There are no aggressive appearing lytic or blastic lesions noted in the visualized portions of the skeleton. CT ABDOMEN PELVIS FINDINGS Hepatobiliary: Diffuse low attenuation throughout the hepatic parenchyma, compatible with a background of hepatic steatosis. No definite cystic or solid hepatic lesions are identified on today's noncontrast CT examination. Status post cholecystectomy. Pancreas: No definite pancreatic mass or peripancreatic inflammatory changes on today's noncontrast CT examination. 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 Adrenals/Urinary Tract: Unenhanced appearance of the kidneys and bilateral adrenal glands is normal. No hydroureteronephrosis. Urinary bladder is normal in appearance. Stomach/Bowel: Unenhanced appearance of the stomach is normal. There is no pathologic dilatation of small bowel or colon. The appendix is not confidently identified and may be surgically absent. Regardless, there are no inflammatory changes noted adjacent to the cecum to suggest the presence of an acute appendicitis at this time. Vascular/Lymphatic: Calcified atherosclerotic plaque in the abdominal aorta and pelvic vasculature, without definite aneurysm. No lymphadenopathy is noted in the abdomen or pelvis on today's noncontrast CT examination. Reproductive: Uterus and ovaries are atrophic. Other: Umbilical hernia containing only  omental fat. No significant volume of ascites. No pneumoperitoneum. Musculoskeletal: There are no aggressive appearing lytic or blastic lesions noted in the visualized portions of the skeleton. IMPRESSION: 1. Mass-like area of apparent airspace consolidation centered in the superior segment of the left lower lobe with small left parapneumonic pleural effusion. Followup PA and lateral chest radiographs are recommended within the next 2-3 weeks to ensure resolution of this finding, as underlying neoplasm is not excluded. 2. Cardiomegaly with biatrial dilatation, and evidence of mild interstitial pulmonary edema. These findings suggest mild congestive heart failure. 3. Multiple enlarged mediastinal lymph nodes, most evident in  the prevascular nodal stations. In the setting of presumed congestive heart failure, these findings are nonspecific, but favored to be benign given that prominent lymph nodes in these regions were also noted on remote prior examination 09/07/2009. No other lymphadenopathy is noted elsewhere in the abdomen or pelvis. Should the apparent airspace consolidation in the superior segment of the left lower lobe fail to resolve, the possibility of underlying primary bronchogenic malignancy with associated mediastinal lymphadenopathy should be considered. 4. Aortic atherosclerosis. 5. Hepatic steatosis. 6. Umbilical hernia containing only omental fat incidentally noted. 7. Additional incidental findings, as above. Electronically Signed   By: Vinnie Langton M.D.   On: 04/02/2016 16:59   Dg Chest 2 View  Result Date: 04/03/2016 CLINICAL DATA:  Cough and wheezing EXAM: CHEST  2 VIEW COMPARISON:  04/02/2016, 03/14/2016, 07/06/2014 FINDINGS: Stable enlargement of the cardiomediastinal with central vascular congestion. Mild diffuse interstitial prominence suggests mild edema. No consolidation or effusion. No pneumothorax. Asymmetrically dense left hilum. IMPRESSION: 1. Cardiomegaly with central vascular  congestion and mild interstitial edema 2. Asymmetrically dense left hilum as before, CT scan was previously recommended for further evaluation. Electronically Signed   By: Donavan Foil M.D.   On: 04/03/2016 19:43   Dg Chest 2 View  Result Date: 03/14/2016 CLINICAL DATA:  Cough, shortness of breath. EXAM: CHEST  2 VIEW COMPARISON:  Radiographs of November 24, 2015. FINDINGS: Stable cardiomegaly. Status post cardiac valve repair. No pneumothorax or pleural effusion is noted. Multilevel degenerative disc disease is noted in the thoracic spine. Stable right basilar density is noted concerning for atelectasis or scarring. Left lung is clear. IMPRESSION: Stable right basilar scarring or atelectasis. Electronically Signed   By: Marijo Conception, M.D.   On: 03/14/2016 13:05   Ct Chest Wo Contrast  Result Date: 04/02/2016 CLINICAL DATA:  65 year old female with history of shortness of breath, abdominal pain and low back pain. EXAM: CT CHEST, ABDOMEN AND PELVIS WITHOUT CONTRAST TECHNIQUE: Multidetector CT imaging of the chest, abdomen and pelvis was performed following the standard protocol without IV contrast. COMPARISON:  None. FINDINGS: CT CHEST FINDINGS Cardiovascular: Heart size is enlarged with biatrial dilatation. Status post mitral annuloplasty. There is no significant pericardial fluid, thickening or pericardial calcification. Mediastinum/Nodes: Several borderline enlarged and mildly enlarged mediastinal lymph nodes are noted, measuring up to 12 mm in short axis in the prevascular nodal stations. Esophagus is unremarkable in appearance. No axillary lymphadenopathy. Lungs/Pleura: Diffuse interlobular septal thickening and mild ground-glass attenuation, suggesting a background of mild interstitial pulmonary edema. There is a more focal area of mass-like airspace consolidation in the superior segment of the left lower lobe, concerning for pneumonia. Small left pleural effusion lying dependently. Musculoskeletal:  There are no aggressive appearing lytic or blastic lesions noted in the visualized portions of the skeleton. CT ABDOMEN PELVIS FINDINGS Hepatobiliary: Diffuse low attenuation throughout the hepatic parenchyma, compatible with a background of hepatic steatosis. No definite cystic or solid hepatic lesions are identified on today's noncontrast CT examination. Status post cholecystectomy. Pancreas: No definite pancreatic mass or peripancreatic inflammatory changes on today's noncontrast CT examination. 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 Adrenals/Urinary Tract: Unenhanced appearance of the kidneys and bilateral adrenal glands is normal. No hydroureteronephrosis. Urinary bladder is normal in appearance. Stomach/Bowel: Unenhanced appearance of the stomach is normal. There is no pathologic dilatation of small bowel or colon. The appendix is not confidently identified and may be surgically absent. Regardless, there are no inflammatory changes noted adjacent to the cecum to suggest  the presence of an acute appendicitis at this time. Vascular/Lymphatic: Calcified atherosclerotic plaque in the abdominal aorta and pelvic vasculature, without definite aneurysm. No lymphadenopathy is noted in the abdomen or pelvis on today's noncontrast CT examination. Reproductive: Uterus and ovaries are atrophic. Other: Umbilical hernia containing only omental fat. No significant volume of ascites. No pneumoperitoneum. Musculoskeletal: There are no aggressive appearing lytic or blastic lesions noted in the visualized portions of the skeleton. IMPRESSION: 1. Mass-like area of apparent airspace consolidation centered in the superior segment of the left lower lobe with small left parapneumonic pleural effusion. Followup PA and lateral chest radiographs are recommended within the next 2-3 weeks to ensure resolution of this finding, as underlying neoplasm is not excluded. 2. Cardiomegaly with biatrial dilatation, and  evidence of mild interstitial pulmonary edema. These findings suggest mild congestive heart failure. 3. Multiple enlarged mediastinal lymph nodes, most evident in the prevascular nodal stations. In the setting of presumed congestive heart failure, these findings are nonspecific, but favored to be benign given that prominent lymph nodes in these regions were also noted on remote prior examination 09/07/2009. No other lymphadenopathy is noted elsewhere in the abdomen or pelvis. Should the apparent airspace consolidation in the superior segment of the left lower lobe fail to resolve, the possibility of underlying primary bronchogenic malignancy with associated mediastinal lymphadenopathy should be considered. 4. Aortic atherosclerosis. 5. Hepatic steatosis. 6. Umbilical hernia containing only omental fat incidentally noted. 7. Additional incidental findings, as above. Electronically Signed   By: Vinnie Langton M.D.   On: 04/02/2016 16:59   Mr Thoracic Spine Wo Contrast  Result Date: 04/07/2016 CLINICAL DATA:  One week history of left-sided back pain which is progressive. Difficulty ambulating. EXAM: MRI THORACIC AND LUMBAR SPINE WITHOUT CONTRAST TECHNIQUE: Multiplanar and multiecho pulse sequences of the thoracic and lumbar spine were obtained without intravenous contrast. COMPARISON:  CT 04/05/2016 FINDINGS: MRI THORACIC SPINE FINDINGS Alignment:  Mild straightening of the thoracic alignment. Vertebrae: No fracture. Marrow edema anteriorly at T11-12 which is nonspecific. See below. Cord:  No cord compression or primary cord lesion. Paraspinal and other soft tissues: Small left effusion with dependent left lung atelectasis or pneumonia. Disc levels: No significant finding from L1-2 through T10-11. Mild desiccation of the discs with minimal bulges but no herniation or stenosis. No edematous facet arthropathy. At T11-12, as noted above, there is edema of within the anterior inferior portion of the T11 vertebral  body in the anterior superior portion of the T12 vertebral body. There is degenerative disc disease with small endplate irregularities. Usually, this is discogenic. This can not be differentiated from early discitis osteomyelitis. T12-L1 shows mild degenerative change as discussed in the lumbar section. There is mild T2 signal in the disc, but this is likely degenerative. MRI LUMBAR SPINE FINDINGS Segmentation:  S1 is transitional. Alignment:  Normal Vertebrae:  No fracture or primary bone lesion. Conus medullaris: Extends to the L2 level and appears normal. Paraspinal and other soft tissues: No significant finding. Disc levels: L1-2: Disc degeneration with loss of height. Mild T2 signal in the disc which can be degenerative. Minimal degenerative endplate changes. Mild bulging of the disc. No stenosis or neural compression. L2-3: Endplate osteophytes and bulging of the disc. Mild narrowing of the lateral recesses right more than left but no compressive stenosis. L3-4: Endplate osteophytes mild bulging of the disc. Mild narrowing of the lateral recesses but no neural compression. L4-5: Mild bulging of the disc. Mild facet hypertrophy. Mild narrowing of the lateral recesses  but no neural compression. L5-S1: Endplate osteophytes and bulging of the disc. Mild facet hypertrophy. No compressive stenosis. S1-2:  Transitional level.  No disc pathology.  No stenosis IMPRESSION: MR THORACIC SPINE IMPRESSION Ordinary mild degenerative changes. Edema within the anterior aspects of the T11 and T12 vertebral bodies that is favored to be discogenic secondary to degenerative change. However, early discitis osteomyelitis can have this appearance. If there is clinical concern, follow-up would be suggested. MR LUMBAR SPINE IMPRESSION In the lumbar region, there is ordinary mild degenerative disc disease and degenerative facet disease but no advanced pathology. Mild lateral recess stenosis at L4-5. Electronically Signed   By: Nelson Chimes M.D.   On: 04/07/2016 10:32   Mr Lumbar Spine Wo Contrast  Result Date: 04/07/2016 CLINICAL DATA:  One week history of left-sided back pain which is progressive. Difficulty ambulating. EXAM: MRI THORACIC AND LUMBAR SPINE WITHOUT CONTRAST TECHNIQUE: Multiplanar and multiecho pulse sequences of the thoracic and lumbar spine were obtained without intravenous contrast. COMPARISON:  CT 04/05/2016 FINDINGS: MRI THORACIC SPINE FINDINGS Alignment:  Mild straightening of the thoracic alignment. Vertebrae: No fracture. Marrow edema anteriorly at T11-12 which is nonspecific. See below. Cord:  No cord compression or primary cord lesion. Paraspinal and other soft tissues: Small left effusion with dependent left lung atelectasis or pneumonia. Disc levels: No significant finding from L1-2 through T10-11. Mild desiccation of the discs with minimal bulges but no herniation or stenosis. No edematous facet arthropathy. At T11-12, as noted above, there is edema of within the anterior inferior portion of the T11 vertebral body in the anterior superior portion of the T12 vertebral body. There is degenerative disc disease with small endplate irregularities. Usually, this is discogenic. This can not be differentiated from early discitis osteomyelitis. T12-L1 shows mild degenerative change as discussed in the lumbar section. There is mild T2 signal in the disc, but this is likely degenerative. MRI LUMBAR SPINE FINDINGS Segmentation:  S1 is transitional. Alignment:  Normal Vertebrae:  No fracture or primary bone lesion. Conus medullaris: Extends to the L2 level and appears normal. Paraspinal and other soft tissues: No significant finding. Disc levels: L1-2: Disc degeneration with loss of height. Mild T2 signal in the disc which can be degenerative. Minimal degenerative endplate changes. Mild bulging of the disc. No stenosis or neural compression. L2-3: Endplate osteophytes and bulging of the disc. Mild narrowing of the lateral  recesses right more than left but no compressive stenosis. L3-4: Endplate osteophytes mild bulging of the disc. Mild narrowing of the lateral recesses but no neural compression. L4-5: Mild bulging of the disc. Mild facet hypertrophy. Mild narrowing of the lateral recesses but no neural compression. L5-S1: Endplate osteophytes and bulging of the disc. Mild facet hypertrophy. No compressive stenosis. S1-2:  Transitional level.  No disc pathology.  No stenosis IMPRESSION: MR THORACIC SPINE IMPRESSION Ordinary mild degenerative changes. Edema within the anterior aspects of the T11 and T12 vertebral bodies that is favored to be discogenic secondary to degenerative change. However, early discitis osteomyelitis can have this appearance. If there is clinical concern, follow-up would be suggested. MR LUMBAR SPINE IMPRESSION In the lumbar region, there is ordinary mild degenerative disc disease and degenerative facet disease but no advanced pathology. Mild lateral recess stenosis at L4-5. Electronically Signed   By: Nelson Chimes M.D.   On: 04/07/2016 10:32   Dg Chest Port 1 View  Result Date: 04/02/2016 CLINICAL DATA:  Shortness of breath and cough EXAM: PORTABLE CHEST 1 VIEW COMPARISON:  March 14, 2016 FINDINGS: There is moderate interstitial pulmonary edema. There is no airspace consolidation. Heart is mildly enlarged with pulmonary venous hypertension. There is prominence in the left perihilar region. No other areas concerning for potential adenopathy. No bone lesions. IMPRESSION: Evidence a degree of congestive heart failure. Prominence in the left hilar region, concerning for adenopathy. This finding warrants contrast enhanced chest CT to further assess. Electronically Signed   By: Lowella Grip III M.D.   On: 04/02/2016 15:08   ECHOCARDIOGRAM Study Conclusions  - Left ventricle: The cavity size was normal. Systolic function was   normal. The estimated ejection fraction was in the range of 55%   to 60%.  Wall motion was normal; there were no regional wall   motion abnormalities. The study is not technically sufficient to   allow evaluation of LV diastolic function. Doppler parameters are   consistent with high ventricular filling pressure. - Aortic valve: Transvalvular velocity was within the normal range.   There was no stenosis. There was no regurgitation. - Mitral valve: Prior procedures included surgical repair. The   findings are consistent with mild stenosis. Valve area by   pressure half-time: 1.58 cm^2. Valve area by continuity equation   (using LVOT flow): 1.03 cm^2. - Left atrium: The atrium was severely dilated. - Right atrium: The atrium was severely dilated. - Tricuspid valve: There was moderate-severe regurgitation. - Pulmonary arteries: Systolic pressure was severely increased. PA   peak pressure: 86 mm Hg (S).  Impressions:  - Mean gradient across the mitral valve is 8 mmHg, compared with 10   mmHg 11/10/2014. However, based on the pressure half time, there   is mosly likely mild mitral stenosis, unchanged from prior.  Subjective: Seen and examined at bedside and was doing better with breathing and swelling wise. Had some back pain today but stated it was tolerable. No other complaints and ready to go home.   Discharge Exam: Vitals:   04/07/16 2111 04/08/16 0536  BP: 113/64 (!) 106/53  Pulse: (!) 56 80  Resp: 13 16  Temp: 97.8 F (36.6 C) 97.8 F (36.6 C)   Vitals:   04/07/16 1329 04/07/16 1352 04/07/16 2111 04/08/16 0536  BP: 137/76  113/64 (!) 106/53  Pulse: 70  (!) 56 80  Resp: 18  13 16   Temp: 98.6 F (37 C)  97.8 F (36.6 C) 97.8 F (36.6 C)  TempSrc: Oral  Oral Oral  SpO2: 97% 96% 99% 93%  Weight:    51 kg (112 lb 6.4 oz)  Height:       General: Pt is alert, awake, not in acute distress Cardiovascular: RRR, S1/S2 +, no rubs, no gallops Respiratory: Diminished but CTA B/L, no wheezing, no rhonchi Abdominal: Soft, NT, ND, bowel sounds  + Extremities: Mild edema, no cyanosis  The results of significant diagnostics from this hospitalization (including imaging, microbiology, ancillary and laboratory) are listed below for reference.    Microbiology: Recent Results (from the past 240 hour(s))  Culture, blood (routine x 2) Call MD if unable to obtain prior to antibiotics being given     Status: None   Collection Time: 04/02/16  6:45 PM  Result Value Ref Range Status   Specimen Description BLOOD RIGHT ARM  Final   Special Requests   Final    BOTTLES DRAWN AEROBIC AND ANAEROBIC BLUE 10CC [place]   Culture   Final    NO GROWTH 5 DAYS Performed at [facility]    Report Status 04/07/2016  FINAL  Final  Culture, blood (routine x 2) Call MD if unable to obtain prior to antibiotics being given     Status: None   Collection Time: 04/02/16  6:55 PM  Result Value Ref Range Status   Specimen Description BLOOD LEFT HAND  Final   Special Requests BOTTLES DRAWN AEROBIC AND ANAEROBIC 10CC  Final   Culture   Final    NO GROWTH 5 DAYS Performed at [facility]    Report Status 04/07/2016 FINAL  Final  Culture, sputum-assessment     Status: None   Collection Time: 04/03/16 10:57 AM  Result Value Ref Range Status   Specimen Description SPUTUM  Final   Special Requests Normal  Final   Sputum evaluation   Final    THIS SPECIMEN IS ACCEPTABLE. RESPIRATORY CULTURE REPORT TO FOLLOW.   Report Status 04/03/2016 FINAL  Final  Culture, respiratory (NON-Expectorated)     Status: None   Collection Time: 04/03/16 10:57 AM  Result Value Ref Range Status   Specimen Description SPUTUM  Final   Special Requests NONE  Final   Gram Stain   Final    ABUNDANT WBC PRESENT, PREDOMINANTLY PMN FEW GRAM POSITIVE RODS RARE GRAM POSITIVE COCCI IN PAIRS RARE GRAM NEGATIVE RODS    Culture   Final    Consistent with normal respiratory flora. Performed at [facility]    Report Status 04/06/2016 FINAL  Final     Labs: BNP (last 3  results)  Recent Labs  12/30/15 1556 04/02/16 1447  BNP 443.0* [id number]*   Basic Metabolic Panel:  Recent Labs Lab 04/03/16 0504 04/04/16 0457 04/05/16 0443 04/06/16 0345 04/07/16 0454 04/08/16 0451  NA 138 133* 136 133* 133* 133*  K 3.4* 3.9 3.6 4.4 5.0 4.4  CL 99* 96* 97* 92* 96* 93*  CO2 30 30 31  32 29 31  GLUCOSE 105* 120* 120* 104* 104* 101*  BUN 17 22* 20 15 17  21*  CREATININE 0.82 1.11* 0.97 0.98 0.99 1.25*  CALCIUM 8.7* 8.4* 8.6* 8.5* 9.1 8.6*  MG 1.7  --  1.7  --  1.7 1.5*  PHOS  --   --  2.7  --  3.8 3.8   Liver Function Tests:  Recent Labs Lab 04/02/16 1446 04/05/16 0443 04/07/16 0454  AST 20 15 15   ALT 16 11* 11*  ALKPHOS 83 73 75  BILITOT 1.7* 1.6* 1.4*  PROT 7.1 6.5 6.5  ALBUMIN 3.5 3.2* 3.1*    Recent Labs Lab 04/02/16 1446  LIPASE 23    Recent Labs Lab 04/02/16 1447  AMMONIA 23   CBC:  Recent Labs Lab 04/02/16 1446  04/04/16 0457 04/05/16 0443 04/06/16 0345 04/07/16 0454 04/08/16 0451  WBC 17.6*  < > 16.9* 15.3* 12.5* 11.7* 11.0*  NEUTROABS 13.3*  --   --  10.0* 6.6 6.3 6.5  HGB 7.4*  < > 7.2* 7.2* 7.3* 7.6* 7.2*  HCT 22.9*  < > 22.9* 23.3* 23.6* 24.2* 22.9*  MCV 111.2*  < > 112.3* 111.0* 109.3* 110.0* 108.5*  PLT 405*  < > 420* 458* 502* 555* 546*  < > = values in this interval not displayed. Cardiac Enzymes: No results for input(s): CKTOTAL, CKMB, CKMBINDEX, TROPONINI in the last 168 hours. BNP: Invalid input(s): POCBNP CBG: No results for input(s): GLUCAP in the last 168 hours. D-Dimer No results for input(s): DDIMER in the last 72 hours. Hgb A1c No results for input(s): HGBA1C in the last 72 hours. Lipid Profile No  results for input(s): CHOL, HDL, LDLCALC, TRIG, CHOLHDL, LDLDIRECT in the last 72 hours. Thyroid function studies No results for input(s): TSH, T4TOTAL, T3FREE, THYROIDAB in the last 72 hours.  Invalid input(s): FREET3 Anemia work up No results for input(s): VITAMINB12, FOLATE, FERRITIN, TIBC, IRON,  RETICCTPCT in the last 72 hours. Urinalysis    Component Value Date/Time   COLORURINE YELLOW 04/02/2016 1624   APPEARANCEUR CLEAR 04/02/2016 1624   LABSPEC 1.017 04/02/2016 1624   LABSPEC 1.015 04/27/2010 1236   PHURINE 5.5 04/02/2016 1624   GLUCOSEU NEGATIVE 04/02/2016 1624   HGBUR NEGATIVE 04/02/2016 1624   BILIRUBINUR NEGATIVE 04/02/2016 1624   BILIRUBINUR Negative 04/27/2010 1236   KETONESUR NEGATIVE 04/02/2016 1624   PROTEINUR NEGATIVE 04/02/2016 1624   UROBILINOGEN 1.0 09/08/2013 1323   NITRITE NEGATIVE 04/02/2016 1624   LEUKOCYTESUR NEGATIVE 04/02/2016 1624   LEUKOCYTESUR Negative 04/27/2010 1236   Sepsis Labs Invalid input(s): PROCALCITONIN,  WBC,  LACTICIDVEN Microbiology Recent Results (from the past 240 hour(s))  Culture, blood (routine x 2) Call MD if unable to obtain prior to antibiotics being given     Status: None   Collection Time: 04/02/16  6:45 PM  Result Value Ref Range Status   Specimen Description BLOOD RIGHT ARM  Final   Special Requests   Final    BOTTLES DRAWN AEROBIC AND ANAEROBIC BLUE 10CC [place]   Culture   Final    NO GROWTH 5 DAYS Performed at [facility]    Report Status 04/07/2016 FINAL  Final  Culture, blood (routine x 2) Call MD if unable to obtain prior to antibiotics being given     Status: None   Collection Time: 04/02/16  6:55 PM  Result Value Ref Range Status   Specimen Description BLOOD LEFT HAND  Final   Special Requests BOTTLES DRAWN AEROBIC AND ANAEROBIC 10CC  Final   Culture   Final    NO GROWTH 5 DAYS Performed at [facility]    Report Status 04/07/2016 FINAL  Final  Culture, sputum-assessment     Status: None   Collection Time: 04/03/16 10:57 AM  Result Value Ref Range Status   Specimen Description SPUTUM  Final   Special Requests Normal  Final   Sputum evaluation   Final    THIS SPECIMEN IS ACCEPTABLE. RESPIRATORY CULTURE REPORT TO FOLLOW.   Report Status 04/03/2016 FINAL  Final  Culture, respiratory  (NON-Expectorated)     Status: None   Collection Time: 04/03/16 10:57 AM  Result Value Ref Range Status   Specimen Description SPUTUM  Final   Special Requests NONE  Final   Gram Stain   Final    ABUNDANT WBC PRESENT, PREDOMINANTLY PMN FEW GRAM POSITIVE RODS RARE GRAM POSITIVE COCCI IN PAIRS RARE GRAM NEGATIVE RODS    Culture   Final    Consistent with normal respiratory flora. Performed at [facility]    Report Status 04/06/2016 FINAL  Final   Time coordinating discharge: Over 30 minutes  SIGNED:  Kerney Elbe, DO [facility] 04/08/2016, 8:04 PM Pager [phone]  If 7PM-7AM, please contact night-coverage [email or web address] Password [email or web address]

## 2016-04-08 NOTE — Care Management Note (Signed)
Case Management Note  Patient Details  Name: [name] MRN: [id number] Date of Birth: [birth year]  Subjective/Objective:    CHF, HTN, CAP                Action/Plan: Discharge Planning: AVS reviewed: Chart reviewed. Please see previous NCM notes. Contacted [place] Liaison to make aware of scheduled dc home today with [facility] RN.   PCP- Josetta Huddle MD  Expected Discharge Date:  04/08/2016              Expected Discharge Plan:  [place]  In-House Referral:  NA  Discharge planning Services  CM Consult  Post Acute Care Choice:  Home Health Choice offered to:  Patient  DME Arranged:  N/A DME Agency:  NA  HH Arranged:  RN [place] Agency:  [place]  Status of Service:  Completed, signed off  If discussed at [place] of Stay Meetings, dates discussed:    Additional Comments:  Erenest Rasher, RN 04/08/2016, 10:43 AM

## 2016-04-10 ENCOUNTER — Telehealth: Payer: Self-pay | Admitting: *Deleted

## 2016-04-10 ENCOUNTER — Telehealth: Payer: Self-pay | Admitting: Oncology

## 2016-04-10 ENCOUNTER — Ambulatory Visit: Payer: Medicare Other | Admitting: Oncology

## 2016-04-10 ENCOUNTER — Other Ambulatory Visit: Payer: Medicare Other

## 2016-04-10 NOTE — Telephone Encounter (Signed)
Call from pt requesting info on Exjade assistance. [employer] information given. Pt will check online for 2018 assistance. Informed her of rescheduled lab/office visit.

## 2016-04-10 NOTE — Telephone Encounter (Signed)
sw pt husband to inform of r/s appt to 11/27 per LOS

## 2016-04-11 DIAGNOSIS — J189 Pneumonia, unspecified organism: Secondary | ICD-10-CM | POA: Diagnosis not present

## 2016-04-11 DIAGNOSIS — M549 Dorsalgia, unspecified: Secondary | ICD-10-CM | POA: Diagnosis not present

## 2016-04-23 ENCOUNTER — Telehealth: Payer: Self-pay | Admitting: *Deleted

## 2016-04-23 ENCOUNTER — Ambulatory Visit (HOSPITAL_BASED_OUTPATIENT_CLINIC_OR_DEPARTMENT_OTHER): Payer: Medicare Other | Admitting: Oncology

## 2016-04-23 ENCOUNTER — Telehealth: Payer: Self-pay | Admitting: Oncology

## 2016-04-23 ENCOUNTER — Other Ambulatory Visit (HOSPITAL_BASED_OUTPATIENT_CLINIC_OR_DEPARTMENT_OTHER): Payer: Medicare Other

## 2016-04-23 VITALS — BP 125/61 | HR 73 | Temp 98.3°F | Resp 17 | Ht 62.0 in | Wt 164.8 lb

## 2016-04-23 DIAGNOSIS — M549 Dorsalgia, unspecified: Secondary | ICD-10-CM

## 2016-04-23 DIAGNOSIS — D552 Anemia due to disorders of glycolytic enzymes: Secondary | ICD-10-CM

## 2016-04-23 DIAGNOSIS — D594 Other nonautoimmune hemolytic anemias: Secondary | ICD-10-CM

## 2016-04-23 LAB — CBC WITH DIFFERENTIAL/PLATELET
BASO%: 0.2 % (ref 0.0–2.0)
Basophils Absolute: 0 10*3/uL (ref 0.0–0.1)
EOS%: 4 % (ref 0.0–7.0)
Eosinophils Absolute: 0.4 10*3/uL (ref 0.0–0.5)
HEMATOCRIT: 27.5 % — AB (ref 34.8–46.6)
HEMOGLOBIN: 8.8 g/dL — AB (ref 11.6–15.9)
LYMPH#: 3.1 10*3/uL (ref 0.9–3.3)
LYMPH%: 31.5 % (ref 14.0–49.7)
MCH: 36.4 pg — ABNORMAL HIGH (ref 25.1–34.0)
MCHC: 32 g/dL (ref 31.5–36.0)
MCV: 113.9 fL — ABNORMAL HIGH (ref 79.5–101.0)
MONO#: 1.9 10*3/uL — AB (ref 0.1–0.9)
MONO%: 19.5 % — ABNORMAL HIGH (ref 0.0–14.0)
NEUT%: 44.8 % (ref 38.4–76.8)
NEUTROS ABS: 4.4 10*3/uL (ref 1.5–6.5)
PLATELETS: 496 10*3/uL — AB (ref 145–400)
RBC: 2.42 10*6/uL — ABNORMAL LOW (ref 3.70–5.45)
RDW: 16.6 % — AB (ref 11.2–14.5)
WBC: 9.9 10*3/uL (ref 3.9–10.3)

## 2016-04-23 LAB — FERRITIN: Ferritin: 44 ng/ml (ref 9–269)

## 2016-04-23 NOTE — Progress Notes (Signed)
  [name]   Diagnosis: Hereditary pyruvate kinase deficiency   INTERVAL HISTORY:   [name] returns as scheduled. She was discharged on 04/08/2016 after an admission with pneumonia. She had severe back pain while in the hospital. No apparent explanation for the pain on imaging studies including MRIs of the thoracic and lumbar spine. The pain is significantly improved, but she continues 2 Percocet tablets per day for relief of back pain.  Objective:  Vital signs in last 24 hours:  Blood pressure 125/61, pulse 73, temperature 98.3 F (36.8 C), temperature source Oral, resp. rate 17, height 5\' 2"  (1.575 m), weight 164 lb 12.8 oz (74.8 kg), SpO2 93 %.    Resp: Coarse end inspiratory rhonchi at the left upper and lower posterior chest, no respiratory distress Cardio: Regular rate and rhythm GI: No hepatomegaly Vascular: No leg edema   Lab Results:  Lab Results  Component Value Date   WBC 9.9 04/23/2016   HGB 8.8 (L) 04/23/2016   HCT 27.5 (L) 04/23/2016   MCV 113.9 (H) 04/23/2016   PLT 496 (H) 04/23/2016   NEUTROABS 4.4 04/23/2016     Medications: I have reviewed the patient's current medications.  Assessment/Plan:  1. Hereditary pyruvate kinase deficiency, chronic anemia 2. 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 History of atrial fibrillation/flutter, status post a Maze procedure 4. History of mitral regurgitation, status post mitral valve repair by [name] in February 2010 5. 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 History of right diaphragm paralysis 7. Admission 04/02/2016 with left lung pneumonia 8. Severe back painDuring a hospital admission 04/02/2016-etiology unclear, improved    Disposition:  [name] is stable from a hematologic standpoint. She continues Exjade for secondary iron overload. We will follow-up on the ferritin level from today. She is approved to receive assistance for  the Exjade through March 2018. She will apply for renewal of the drug assistance program.  She will follow-up with [name] for management of the back pain.  [name] will return for an office visit and ferritin level in 4 months.  [name], MD  04/23/2016  9:12 AM

## 2016-04-23 NOTE — Telephone Encounter (Signed)
Appointments scheduled per 04/23/16 los. A copy of the  AVS report and appointment schedule was given to patient,per 04/23/16 los.  °

## 2016-04-23 NOTE — Telephone Encounter (Signed)
Per Dr. Benay Spice, message left for patient to inform her that ferritin is normal, to continue exjade and to f/u as scheduled.  Instructed pt to call [place] back with any questions or concerns.

## 2016-04-23 NOTE — Telephone Encounter (Signed)
-----   Message from Ladell Pier, MD sent at 04/23/2016  1:57 PM EST ----- Please call patient, ferritin is normal, continue exjade, f/u as scheduled

## 2016-04-26 ENCOUNTER — Ambulatory Visit
Admission: RE | Admit: 2016-04-26 | Discharge: 2016-04-26 | Disposition: A | Discharge status: Home / Self Care | Payer: Medicare Other | Source: Ambulatory Visit | Attending: Internal Medicine | Admitting: Internal Medicine

## 2016-04-26 ENCOUNTER — Other Ambulatory Visit: Payer: Self-pay | Admitting: Internal Medicine

## 2016-04-26 DIAGNOSIS — M549 Dorsalgia, unspecified: Secondary | ICD-10-CM | POA: Diagnosis not present

## 2016-04-26 DIAGNOSIS — J189 Pneumonia, unspecified organism: Secondary | ICD-10-CM

## 2016-04-26 DIAGNOSIS — R079 Chest pain, unspecified: Secondary | ICD-10-CM | POA: Diagnosis not present

## 2016-05-14 ENCOUNTER — Encounter: Payer: Self-pay | Admitting: Emergency Medicine

## 2016-05-14 ENCOUNTER — Ambulatory Visit (INDEPENDENT_AMBULATORY_CARE_PROVIDER_SITE_OTHER): Payer: Medicare Other | Admitting: Emergency Medicine

## 2016-05-14 DIAGNOSIS — J45909 Unspecified asthma, uncomplicated: Secondary | ICD-10-CM | POA: Diagnosis not present

## 2016-05-14 DIAGNOSIS — G4733 Obstructive sleep apnea (adult) (pediatric): Secondary | ICD-10-CM

## 2016-05-14 DIAGNOSIS — J301 Allergic rhinitis due to pollen: Secondary | ICD-10-CM

## 2016-05-14 NOTE — Assessment & Plan Note (Signed)
Continue CPAP every night. 

## 2016-05-14 NOTE — Assessment & Plan Note (Addendum)
Please continue Breo once a day. Remember to rinse and gargle after using this medication. Take albuterol 2 puffs up to every 4 hours if needed for shortness of breath.    Oxygen as ordered.  Flu shot up to date.  Follow with Dr Lamonte Sakai in 4 months or sooner if you have any problems.

## 2016-05-14 NOTE — Assessment & Plan Note (Signed)
Please continue your Flonase, Allegra Increase chlorpheniramine tabs 2 up to every 6 hours to see if this elsewhere your congestion and hoarse throat. Back to using this daily at bedtime once your throat symptoms improve.  Follow with Dr Lamonte Sakai in 4 months or sooner if you have any problems.

## 2016-05-14 NOTE — Patient Instructions (Signed)
Please continue Breo once a day. Remember to rinse and gargle after using this medication. Take albuterol 2 puffs up to every 4 hours if needed for shortness of breath.  Please continue your Flonase, Allegra Increase chlorpheniramine tabs 2 up to every 6 hours to see if this elsewhere your congestion and hoarse throat. Back to using this daily at bedtime once your throat symptoms improve.  Use CPAP every night Oxygen as ordered.  Flu shot up to date.  Follow with Dr Lamonte Sakai in 4 months or sooner if you have any problems.

## 2016-05-14 NOTE — Progress Notes (Signed)
Subjective:    Patient ID: [name], female    DOB: 14-Nov-1950, 65 y.o.   MRN: [id number]  Asthma  There is no cough, shortness of breath or wheezing. Associated symptoms include postnasal drip and a sore throat. Pertinent negatives include no ear pain, fever, headaches, rhinorrhea, sneezing or trouble swallowing. Her past medical history is significant for asthma.   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  She was treated here for an acute exacerbation of her asthma in October. Unfortunately she was subsequently admitted to the hospital for a presumed CAP. The presentation was actually for back pain. Additionally there may have been some component of CHF at that time as well. A repeat chest x-ray was done on 04/26/16 that showed clearing of her infiltrates. Her current bronchodilator regimen includes Breo. Has albuterol available but has not used it. She is breathing better. She has daily hoarseness. No real cough. She has been getting over a lot of congestion and nasal gtt. She is on chlorpheniramine qhs, flonase, allegra. She is on CPAP reliably.   Review of Systems  Constitutional: Negative for fever and unexpected weight change.  HENT: Positive for congestion, postnasal drip and sore throat. Negative for dental problem, ear pain, nosebleeds, rhinorrhea, sinus pressure, sneezing and trouble swallowing.   Eyes: Negative for redness and itching.  Respiratory: Negative for cough, chest tightness, shortness of breath and wheezing.   Cardiovascular: Negative for palpitations and leg swelling.  Gastrointestinal: Negative for  nausea and vomiting.  Genitourinary: Negative for dysuria.  Musculoskeletal: Negative for joint swelling.  Skin: Negative for rash.  Neurological: Negative for headaches.  Hematological: Does not bruise/bleed easily.  Psychiatric/Behavioral: Negative for dysphoric mood. The patient is not nervous/anxious.        Objective:   Physical Exam  Vitals:   05/14/16 1055 05/14/16 1056  BP:  120/70  Pulse:  (!) 56  SpO2:  97%  Weight: 161 lb 12.8 oz (73.4 kg)   Height: 5\' 2"  (1.575 m)   Gen: Pleasant, well-nourished, in no distress,  normal affect  ENT: No lesions,  mouth clear,  oropharynx clear, some postnasal drip, hoarse voice  Neck: No JVD, no TMG, no carotid bruits  Lungs: No use of accessory muscles, no dullness to percussion, clear without rales or rhonchi  Cardiovascular: RRR, 2/6 M  Musculoskeletal: No deformities, no cyanosis or clubbing  Neuro: alert, non focal  Skin: Warm, no lesions or rashes      Assessment & Plan:  Obstructive sleep apnea Continue CPAP every night  Asthma, intrinsic Please continue Breo once a day. Remember to rinse and gargle after using this medication. Take albuterol 2 puffs up to every 4 hours if needed for shortness of breath.    Oxygen as ordered.  Flu shot up to date.  Follow with Dr Lamonte Sakai in 4 months or sooner if you have any problems.   Allergic rhinitis Please continue your Flonase, Allegra Increase chlorpheniramine tabs 2 up to every 6 hours to see if this elsewhere your congestion and hoarse throat. Back to using this daily at bedtime once your throat symptoms improve.  Follow with Dr Lamonte Sakai in 4 months or sooner  if you have any problems.  Baltazar Apo, MD, PhD 05/14/2016, 11:15 AM [facility] [phone] or if no answer [phone]

## 2016-06-06 ENCOUNTER — Other Ambulatory Visit (HOSPITAL_COMMUNITY): Payer: Self-pay | Admitting: *Deleted

## 2016-06-06 MED ORDER — POTASSIUM CHLORIDE CRYS ER 20 MEQ PO TBCR: 40.0000 meq | EXTENDED_RELEASE_TABLET | Freq: Every day | ORAL | 3 refills | Status: DC

## 2016-06-07 ENCOUNTER — Telehealth (HOSPITAL_COMMUNITY): Payer: Self-pay | Admitting: *Deleted

## 2016-06-07 ENCOUNTER — Ambulatory Visit (HOSPITAL_COMMUNITY)
Admission: RE | Admit: 2016-06-07 | Discharge: 2016-06-07 | Disposition: A | Discharge status: Home / Self Care | Payer: Medicare Other | Source: Ambulatory Visit | Attending: Cardiology | Admitting: Cardiology

## 2016-06-07 ENCOUNTER — Other Ambulatory Visit: Payer: Self-pay | Admitting: *Deleted

## 2016-06-07 DIAGNOSIS — I5032 Chronic diastolic (congestive) heart failure: Secondary | ICD-10-CM | POA: Diagnosis not present

## 2016-06-07 DIAGNOSIS — I5022 Chronic systolic (congestive) heart failure: Secondary | ICD-10-CM

## 2016-06-07 LAB — BASIC METABOLIC PANEL
ANION GAP: 8 (ref 5–15)
BUN: 19 mg/dL (ref 6–20)
CHLORIDE: 102 mmol/L (ref 101–111)
CO2: 29 mmol/L (ref 22–32)
Calcium: 9.3 mg/dL (ref 8.9–10.3)
Creatinine, Ser: 0.86 mg/dL (ref 0.44–1.00)
GFR calc non Af Amer: >60 mL/min (ref >60)
Glucose, Bld: 135 mg/dL — ABNORMAL HIGH (ref 65–99)
POTASSIUM: 3.6 mmol/L (ref 3.5–5.1)
Sodium: 139 mmol/L (ref 135–145)

## 2016-06-07 MED ORDER — DEFERASIROX 500 MG PO TBSO: 1500.0000 mg | ORAL_TABLET | Freq: Every day | ORAL | 1 refills | Status: DC

## 2016-06-07 MED ORDER — FLUTICASONE FUROATE-VILANTEROL 100-25 MCG/INH IN AEPB: INHALATION_SPRAY | RESPIRATORY_TRACT | 5 refills | Status: DC

## 2016-06-07 NOTE — Telephone Encounter (Signed)
Pt called to report that her RX for potassium is wrong.  She states she has been taking 60 in AM and 40 in PM but her new rx she just picked up said to take 40 daily.  Upon review of chart KCL dose was decreased to 40 meq daily on 04/08/16 when pt was d/c'd from hospital b/c K level was on the high side.  Pt reports her PCP immediately increased her back to 60/40 after that.  Advised I can not refill at that dose since she has not been seen in our office or had repeat labs since that time.  Offered pt to either come in for labs to check K level or have PCP order for her, she request to come in today for labs, labs ordered appt sch, will f/u with her tomorrow with Dr Bensimhon's recommendations on KCL

## 2016-06-22 ENCOUNTER — Other Ambulatory Visit (HOSPITAL_COMMUNITY): Payer: Self-pay | Admitting: *Deleted

## 2016-06-22 MED ORDER — TORSEMIDE 20 MG PO TABS: 40.0000 mg | ORAL_TABLET | Freq: Every day | ORAL | 0 refills | Status: DC

## 2016-06-25 ENCOUNTER — Ambulatory Visit (INDEPENDENT_AMBULATORY_CARE_PROVIDER_SITE_OTHER): Payer: Medicare Other | Admitting: Pulmonary Disease

## 2016-06-25 ENCOUNTER — Encounter: Payer: Self-pay | Admitting: Pulmonary Disease

## 2016-06-25 ENCOUNTER — Encounter (INDEPENDENT_AMBULATORY_CARE_PROVIDER_SITE_OTHER): Payer: Self-pay

## 2016-06-25 VITALS — BP 114/70 | HR 60 | Ht 62.0 in | Wt 161.8 lb

## 2016-06-25 DIAGNOSIS — R6889 Other general symptoms and signs: Secondary | ICD-10-CM

## 2016-06-25 DIAGNOSIS — J9611 Chronic respiratory failure with hypoxia: Secondary | ICD-10-CM

## 2016-06-25 DIAGNOSIS — J209 Acute bronchitis, unspecified: Secondary | ICD-10-CM

## 2016-06-25 DIAGNOSIS — J45909 Unspecified asthma, uncomplicated: Secondary | ICD-10-CM

## 2016-06-25 LAB — POCT INFLUENZA A/B
Influenza A, POC: NEGATIVE
Influenza B, POC: NEGATIVE

## 2016-06-25 MED ORDER — OSELTAMIVIR PHOSPHATE 45 MG PO CAPS: 45.0000 mg | ORAL_CAPSULE | Freq: Two times a day (BID) | ORAL | 0 refills | Status: DC

## 2016-06-25 MED ORDER — AMOXICILLIN-POT CLAVULANATE 875-125 MG PO TABS: 1.0000 | ORAL_TABLET | Freq: Two times a day (BID) | ORAL | 0 refills | Status: DC

## 2016-06-25 MED ORDER — PREDNISONE 10 MG PO TABS: ORAL_TABLET | ORAL | 0 refills | Status: DC

## 2016-06-25 NOTE — Patient Instructions (Signed)
Rapid flu test Tamiflu 50 mg twice daily for 7 days Augmentin 875 twice a day for 7 days  Prednisone 10 mg tabs  Take 2 tabs daily with food x 5ds, then 1 tab daily with food x 5ds then STOP   Call if worse in 2-3 days

## 2016-06-25 NOTE — Assessment & Plan Note (Signed)
Maybe related to influenza Rapid flu test Tamiflu 50 mg twice daily for 7 days Augmentin 875 twice a day for 7 days  Prednisone 10 mg tabs  Take 2 tabs daily with food x 5ds, then 1 tab daily with food x 5ds then STOP   Call if worse in 2-3 days

## 2016-06-25 NOTE — Addendum Note (Signed)
Addended by: Valerie Salts on: 06/25/2016 12:43 PM   Modules accepted: Orders

## 2016-06-25 NOTE — Assessment & Plan Note (Signed)
Continue 2 L oxygen pulse

## 2016-06-25 NOTE — Assessment & Plan Note (Signed)
Continue Brio and albuterol

## 2016-06-25 NOTE — Progress Notes (Signed)
   Subjective:    Patient ID: [name], female    DOB: 09/03/1950, 66 y.o.   MRN: [id number]  HPI   for FU of asthma, allergic rhinitis, restrictive lung disease in the setting of a paralyzed hemidiaphragm and secondary pulmonary hypertension from these issues and also diastolic CHF Hereditary pyruvate kinase deficiency, chronic anemia ([name]) Secondary iron overload maintained on Exjade  History of mitral regurgitation, status post mitral valve repair by Dr. Roxy [name] in February 2010  06/25/2016  Chief Complaint  Patient presents with  . Bronchitis    Sx since last Thursday. Productive cough with green mucus. Increased SOB, normally on 2Ls but increased to 3L. Had a fever on Saturday and Sunday.    Her husband was admitted to the hospital and diagnosed with influenza A She reports feeling ill for 4-5 days, Febrile to 102 cough productive of green sputum and body aches. She also had increased wheezing   Her oxygen requirements have not changed-she continues on 2 L pulse O2   she is allergic to Levaquin  Chest x-ray from 03/2016 was reviewed   Past Medical History:  Diagnosis Date  . Allergic rhinitis   . Anemia   . Asthma   . Asthma   . Atrial fibrillation ([name])    s/p RFA and Maze procedure - resolved  . Atrial tachycardia ([name])   . Depression   . Dyspnea   . Exercise hypoxemia    supplemental O2 PRN  . Fatigue   . Gastritis    NSAID induced  . GERD (gastroesophageal reflux disease)   . Heart murmur    severe MR s/p MV repair  . Hx of asplenia    surgical  . Hypertension   . 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 Myoclonus   . Osteoporosis   . Pulmonary HTN    mild to moderate by [name] 05/2012  . Pyruvate kinase deficiency hemolytic anemia ([name])   . Syncope and collapse 2012   r/t anemia  . Trochanteric bursitis    left hip    Review of Systems neg for any significant sore throat, dysphagia, itching, sneezing,  nasal congestion or excess/ purulent secretions, fever, chills, sweats, unintended wt loss, pleuritic or exertional cp, hempoptysis, orthopnea pnd or change in chronic leg swelling. Also denies presyncope, palpitations, heartburn, abdominal pain, nausea, vomiting, diarrhea or change in bowel or urinary habits, dysuria,hematuria, rash, arthralgias, visual complaints, headache, numbness weakness or ataxia.     Objective:   Physical Exam  Gen. Pleasant, well-nourished, in no distress, normal affect ENT - no lesions, no post nasal drip Neck: No JVD, no thyromegaly, no carotid bruits Lungs: no use of accessory muscles, no dullness to percussion, bilateral scattered rhonchi  Cardiovascular: Rhythm regular, heart sounds  normal, no murmurs or gallops, no peripheral edema Abdomen: soft and non-tender, no hepatosplenomegaly, BS normal. Musculoskeletal: No deformities, no cyanosis or clubbing Neuro:  alert, non focal       Assessment & Plan:

## 2016-06-28 ENCOUNTER — Other Ambulatory Visit (HOSPITAL_COMMUNITY): Payer: Self-pay | Admitting: *Deleted

## 2016-06-28 MED ORDER — TORSEMIDE 20 MG PO TABS
40.0000 mg | ORAL_TABLET | Freq: Every day | ORAL | 3 refills | Status: DC
Start: 2016-06-28 — End: 2016-09-11

## 2016-07-09 ENCOUNTER — Telehealth: Payer: Self-pay | Admitting: Emergency Medicine

## 2016-07-09 NOTE — Telephone Encounter (Signed)
Spoke with pt. And made her aware of MR message. Pt. Has agreed to an appointment. An appointment has been made for tomorrow. Nothing further is needed

## 2016-07-09 NOTE — Telephone Encounter (Signed)
MR    Please Advise-   This is a RB pt. Pt. Saw RA on 06/25/16 where she had a flu test which was neg. Pt. Finished her tamiflu, prednisone,augmentin. Pt. States her temp is 101.2 and she is coughing a lot but unable to produce anything. She has some body aches. She states she prefers not to have to come in for an appointment. She wanted to know what should she do and if something can be called in for her.    Allergies  Allergen Reactions  . Iodine Anaphylaxis  . Shellfish Allergy Anaphylaxis  . Iohexol      Code: HIVES, Desc: reaction since childhood, Onset Date: [id number]   . Levaquin [Levofloxacin In D5w] Nausea And Vomiting    dizziness

## 2016-07-09 NOTE — Telephone Encounter (Signed)
Please tell [name] not sur what is going on. Augmentin is powerful antibipotics and she got prednisone and tamiflu. Now she is having fever. Could be new problem or some undiagnosed problem. At this point, risky to treat pn phone. Rec: come in acute visit with APP 07/10/16 or go to [facility] 07/09/2016. She might need autoimmune workup or other infectious workup   Dr. Brand Males, M.D., [facility].C.P Pulmonary and Critical Care Medicine Staff Physician [facility] Pager: [phone], If no answer or between  15:00h - 7:00h: call [phone]  07/09/2016 3:51 PM

## 2016-07-10 ENCOUNTER — Telehealth: Payer: Self-pay | Admitting: *Deleted

## 2016-07-10 ENCOUNTER — Ambulatory Visit (INDEPENDENT_AMBULATORY_CARE_PROVIDER_SITE_OTHER): Payer: Medicare Other | Admitting: Pulmonary Disease

## 2016-07-10 ENCOUNTER — Encounter: Payer: Self-pay | Admitting: Pulmonary Disease

## 2016-07-10 ENCOUNTER — Other Ambulatory Visit (HOSPITAL_COMMUNITY): Payer: Self-pay | Admitting: *Deleted

## 2016-07-10 ENCOUNTER — Other Ambulatory Visit (INDEPENDENT_AMBULATORY_CARE_PROVIDER_SITE_OTHER): Payer: Medicare Other

## 2016-07-10 ENCOUNTER — Telehealth: Payer: Self-pay

## 2016-07-10 ENCOUNTER — Ambulatory Visit (INDEPENDENT_AMBULATORY_CARE_PROVIDER_SITE_OTHER)
Admission: RE | Admit: 2016-07-10 | Discharge: 2016-07-10 | Disposition: A | Discharge status: Home / Self Care | Payer: Medicare Other | Source: Ambulatory Visit | Attending: Pulmonary Disease | Admitting: Pulmonary Disease

## 2016-07-10 VITALS — BP 114/70 | HR 70 | Temp 98.5°F | Ht 62.0 in | Wt 160.8 lb

## 2016-07-10 DIAGNOSIS — R509 Fever, unspecified: Secondary | ICD-10-CM

## 2016-07-10 DIAGNOSIS — D594 Other nonautoimmune hemolytic anemias: Secondary | ICD-10-CM | POA: Diagnosis not present

## 2016-07-10 DIAGNOSIS — R05 Cough: Secondary | ICD-10-CM | POA: Diagnosis not present

## 2016-07-10 LAB — CBC WITH DIFFERENTIAL/PLATELET
Basophils Absolute: 0.1 10*3/uL (ref 0.0–0.1)
Basophils Relative: 0.8 % (ref 0.0–3.0)
EOS PCT: 1.3 % (ref 0.0–5.0)
Eosinophils Absolute: 0.3 10*3/uL (ref 0.0–0.7)
HCT: 25.8 % — ABNORMAL LOW (ref 36.0–46.0)
Hemoglobin: 8.1 g/dL — ABNORMAL LOW (ref 12.0–15.0)
LYMPHS ABS: 2.4 10*3/uL (ref 0.7–4.0)
Lymphocytes Relative: 12.3 % (ref 12.0–46.0)
MCHC: 31.5 g/dL (ref 30.0–36.0)
MCV: 116.2 fl — AB (ref 78.0–100.0)
MONO ABS: 3.6 10*3/uL — AB (ref 0.1–1.0)
MONOS PCT: 18.7 % — AB (ref 3.0–12.0)
NEUTROS ABS: 13 10*3/uL — AB (ref 1.4–7.7)
NEUTROS PCT: 66.9 % (ref 43.0–77.0)
PLATELETS: 446 10*3/uL — AB (ref 150.0–400.0)
RBC: 2.22 Mil/uL — ABNORMAL LOW (ref 3.87–5.11)
RDW: 17.6 % — ABNORMAL HIGH (ref 11.5–15.5)
WBC: 19.5 10*3/uL (ref 4.0–10.5)

## 2016-07-10 MED ORDER — DILTIAZEM HCL ER COATED BEADS 240 MG PO CP24: 240.0000 mg | ORAL_CAPSULE | Freq: Every day | ORAL | 3 refills | Status: DC

## 2016-07-10 MED ORDER — CEFDINIR 300 MG PO CAPS: 300.0000 mg | ORAL_CAPSULE | Freq: Two times a day (BID) | ORAL | 0 refills | Status: DC

## 2016-07-10 NOTE — Telephone Encounter (Signed)
Dr. Elsworth Soho  Call report on WBC count at 19.5 for this pt.

## 2016-07-10 NOTE — Assessment & Plan Note (Signed)
Unclear etiology We'll check CBC, and cultures blood and urinalysis If leukocytosis, will treat with empiric antibiotics Chest x-ray to rule out right lower lobe pneumonia-she does have coarse breath sounds in the right lower lobe

## 2016-07-10 NOTE — Patient Instructions (Signed)
Obtain blood work, urine check and chest x-ray today We will send an empiric Omnicef 300 mg twice daily for 7 days We will call you with results of tests by tomorrow

## 2016-07-10 NOTE — Telephone Encounter (Signed)
Message received from patient to inform Dr. Benay Spice that she started with a fever of 101 yesterday evening with a max temp of 103 over night and that she has an appt with her Pulmonologist at 4:15PM today.  Dr. Benay Spice notified of patient's condition and order received to call pt to be sure that she is prescribed an antibiotic at appt today.  Patient appreciative of call back and will notify [place] how her appt goes with pulmonology today.

## 2016-07-10 NOTE — Progress Notes (Signed)
   Subjective:    Patient ID: [name], female    DOB: 1950-06-02, 66 y.o.   MRN: [id number]  HPI  for FU of asthma, allergic rhinitis, restrictive lung disease in the setting of a paralyzed hemidiaphragm and secondary pulmonary hypertension from these issues and also diastolic CHF Hereditary pyruvate kinase deficiency, chronic anemia ([name]) Secondary iron overload maintained on Exjade  History of mitral regurgitation, status post mitral valve repair by Dr. [name] in February 2010  07/10/2016  Chief Complaint  Patient presents with  . Acute Visit    High fever since last Saturday. Weak. Amox and Tamiflu did not help.      Her husband was admitted to the hospital 06/25/16 and diagnosed with influenza A She presented then with fever 102, cough with green sputum- treated with Tamiflu empirically and Augmentin and prednisone for bronchospasm. Rapid flu test was negative Improved after she finished this course and stayed well for 2 days although Augmentin did cause her some diarrhea. She then developed fevers again for the last 3 days and this has been as high as 101.5. She now only has a dry cough, denies chest pain or sweats, no burning micturition  She spoke to her hematologist-there is some concern for hemolysis with fevers  Her oxygen requirements have not changed-she continues on 2 L pulse O2   she is allergic to Levaquin  Chest x-ray from 03/2016 was reviewed    Review of Systems neg for any significant sore throat, dysphagia, itching, sneezing, nasal congestion or excess/ purulent secretions,  sweats, unintended wt loss, pleuritic or exertional cp, hempoptysis, orthopnea pnd or change in chronic leg swelling. Also denies presyncope, palpitations, heartburn, abdominal pain, nausea, vomiting, diarrhea or change in bowel or urinary habits, dysuria,hematuria, rash, arthralgias, visual complaints, headache, numbness weakness or ataxia.     Objective:   Physical  Exam   Gen. Pleasant, well-nourished, in no distress ENT - no lesions, no post nasal drip Neck: No JVD, no thyromegaly, no carotid bruits Lungs: no use of accessory muscles, no dullness to percussion, RLL coarse BS without rales or rhonchi  Cardiovascular: Rhythm regular, heart sounds  normal, no murmurs or gallops, no peripheral edema Musculoskeletal: No deformities, no cyanosis or clubbing         Assessment & Plan:

## 2016-07-10 NOTE — Assessment & Plan Note (Signed)
check CBC to ensure she is not hemolyzing

## 2016-07-11 ENCOUNTER — Inpatient Hospital Stay (HOSPITAL_COMMUNITY)
Admission: AD | Admit: 2016-07-11 | Discharge: 2016-07-13 | DRG: 152 | Disposition: A | Discharge status: Home / Self Care | Payer: Medicare Other | Source: Ambulatory Visit | Attending: Internal Medicine | Admitting: Internal Medicine

## 2016-07-11 ENCOUNTER — Encounter (HOSPITAL_COMMUNITY): Payer: Self-pay

## 2016-07-11 DIAGNOSIS — I110 Hypertensive heart disease with heart failure: Secondary | ICD-10-CM | POA: Diagnosis present

## 2016-07-11 DIAGNOSIS — Z888 Allergy status to other drugs, medicaments and biological substances status: Secondary | ICD-10-CM

## 2016-07-11 DIAGNOSIS — E876 Hypokalemia: Secondary | ICD-10-CM | POA: Diagnosis not present

## 2016-07-11 DIAGNOSIS — G4733 Obstructive sleep apnea (adult) (pediatric): Secondary | ICD-10-CM | POA: Diagnosis present

## 2016-07-11 DIAGNOSIS — J069 Acute upper respiratory infection, unspecified: Secondary | ICD-10-CM | POA: Diagnosis present

## 2016-07-11 DIAGNOSIS — D594 Other nonautoimmune hemolytic anemias: Secondary | ICD-10-CM | POA: Diagnosis not present

## 2016-07-11 DIAGNOSIS — K219 Gastro-esophageal reflux disease without esophagitis: Secondary | ICD-10-CM | POA: Diagnosis present

## 2016-07-11 DIAGNOSIS — Z91013 Allergy to seafood: Secondary | ICD-10-CM

## 2016-07-11 DIAGNOSIS — Z7901 Long term (current) use of anticoagulants: Secondary | ICD-10-CM

## 2016-07-11 DIAGNOSIS — R509 Fever, unspecified: Secondary | ICD-10-CM | POA: Diagnosis not present

## 2016-07-11 DIAGNOSIS — Z803 Family history of malignant neoplasm of breast: Secondary | ICD-10-CM

## 2016-07-11 DIAGNOSIS — R6510 Systemic inflammatory response syndrome (SIRS) of non-infectious origin without acute organ dysfunction: Secondary | ICD-10-CM | POA: Diagnosis present

## 2016-07-11 DIAGNOSIS — Z8041 Family history of malignant neoplasm of ovary: Secondary | ICD-10-CM

## 2016-07-11 DIAGNOSIS — J986 Disorders of diaphragm: Secondary | ICD-10-CM | POA: Diagnosis present

## 2016-07-11 DIAGNOSIS — I5033 Acute on chronic diastolic (congestive) heart failure: Secondary | ICD-10-CM

## 2016-07-11 DIAGNOSIS — J45909 Unspecified asthma, uncomplicated: Secondary | ICD-10-CM | POA: Diagnosis present

## 2016-07-11 DIAGNOSIS — Z8249 Family history of ischemic heart disease and other diseases of the circulatory system: Secondary | ICD-10-CM | POA: Diagnosis not present

## 2016-07-11 DIAGNOSIS — Z800 Family history of malignant neoplasm of digestive organs: Secondary | ICD-10-CM

## 2016-07-11 DIAGNOSIS — I272 Pulmonary hypertension, unspecified: Secondary | ICD-10-CM | POA: Diagnosis present

## 2016-07-11 DIAGNOSIS — J9611 Chronic respiratory failure with hypoxia: Secondary | ICD-10-CM | POA: Diagnosis not present

## 2016-07-11 DIAGNOSIS — I4891 Unspecified atrial fibrillation: Secondary | ICD-10-CM | POA: Diagnosis present

## 2016-07-11 DIAGNOSIS — M810 Age-related osteoporosis without current pathological fracture: Secondary | ICD-10-CM | POA: Diagnosis present

## 2016-07-11 DIAGNOSIS — R17 Unspecified jaundice: Secondary | ICD-10-CM | POA: Diagnosis present

## 2016-07-11 DIAGNOSIS — Z881 Allergy status to other antibiotic agents status: Secondary | ICD-10-CM

## 2016-07-11 DIAGNOSIS — Z808 Family history of malignant neoplasm of other organs or systems: Secondary | ICD-10-CM

## 2016-07-11 DIAGNOSIS — D589 Hereditary hemolytic anemia, unspecified: Secondary | ICD-10-CM | POA: Diagnosis not present

## 2016-07-11 DIAGNOSIS — Z825 Family history of asthma and other chronic lower respiratory diseases: Secondary | ICD-10-CM

## 2016-07-11 DIAGNOSIS — I480 Paroxysmal atrial fibrillation: Secondary | ICD-10-CM | POA: Diagnosis not present

## 2016-07-11 DIAGNOSIS — F329 Major depressive disorder, single episode, unspecified: Secondary | ICD-10-CM | POA: Diagnosis present

## 2016-07-11 DIAGNOSIS — J9610 Chronic respiratory failure, unspecified whether with hypoxia or hypercapnia: Secondary | ICD-10-CM | POA: Diagnosis present

## 2016-07-11 DIAGNOSIS — Z801 Family history of malignant neoplasm of trachea, bronchus and lung: Secondary | ICD-10-CM

## 2016-07-11 DIAGNOSIS — Z7951 Long term (current) use of inhaled steroids: Secondary | ICD-10-CM

## 2016-07-11 DIAGNOSIS — Z9081 Acquired absence of spleen: Secondary | ICD-10-CM

## 2016-07-11 HISTORY — DX: Adverse effect of unspecified anesthetic, initial encounter: T41.45XA

## 2016-07-11 HISTORY — DX: Other complications of anesthesia, initial encounter: T88.59XA

## 2016-07-11 LAB — RESPIRATORY PANEL BY PCR
Adenovirus: NOT DETECTED
Bordetella pertussis: NOT DETECTED
CHLAMYDOPHILA PNEUMONIAE-RVPPCR: NOT DETECTED
CORONAVIRUS NL63-RVPPCR: NOT DETECTED
Coronavirus 229E: NOT DETECTED
Coronavirus HKU1: NOT DETECTED
Coronavirus OC43: NOT DETECTED
INFLUENZA A-RVPPCR: NOT DETECTED
Influenza B: NOT DETECTED
MYCOPLASMA PNEUMONIAE-RVPPCR: NOT DETECTED
Metapneumovirus: NOT DETECTED
PARAINFLUENZA VIRUS 4-RVPPCR: NOT DETECTED
Parainfluenza Virus 1: NOT DETECTED
Parainfluenza Virus 2: NOT DETECTED
Parainfluenza Virus 3: NOT DETECTED
RESPIRATORY SYNCYTIAL VIRUS-RVPPCR: NOT DETECTED
RHINOVIRUS / ENTEROVIRUS - RVPPCR: NOT DETECTED

## 2016-07-11 LAB — SEDIMENTATION RATE: SED RATE: 3 mm/h (ref 0–22)

## 2016-07-11 LAB — URINALYSIS, ROUTINE W REFLEX MICROSCOPIC
BILIRUBIN URINE: NEGATIVE
Bilirubin Urine: NEGATIVE
Glucose, UA: NEGATIVE mg/dL
HGB URINE DIPSTICK: NEGATIVE
Hgb urine dipstick: NEGATIVE
Ketones, ur: NEGATIVE
Ketones, ur: NEGATIVE mg/dL
LEUKOCYTES UA: NEGATIVE
NITRITE: NEGATIVE
NITRITE: NEGATIVE
PROTEIN: NEGATIVE mg/dL
RBC / HPF: NONE SEEN (ref 0–?)
SPECIFIC GRAVITY, URINE: 1.017 (ref 1.005–1.030)
Specific Gravity, Urine: <=1.005 — AB (ref 1.000–1.030)
TOTAL PROTEIN, URINE-UPE24: 30 — AB
Urine Glucose: NEGATIVE
Urobilinogen, UA: >=8 — AB (ref 0.0–1.0)
pH: 7.5 (ref 5.0–8.0)
pH: 6 (ref 5.0–8.0)

## 2016-07-11 LAB — COMPREHENSIVE METABOLIC PANEL
ALBUMIN: 3.7 g/dL (ref 3.5–5.0)
ALK PHOS: 81 U/L (ref 38–126)
ALT: 14 U/L (ref 0–35)
ALT: 16 U/L (ref 14–54)
AST: 19 U/L (ref 0–37)
AST: 21 U/L (ref 15–41)
Albumin: 3.7 g/dL (ref 3.5–5.2)
Alkaline Phosphatase: 80 U/L (ref 39–117)
Anion gap: 5 (ref 5–15)
BUN: 12 mg/dL (ref 6–23)
BUN: 12 mg/dL (ref 6–20)
CALCIUM: 8.7 mg/dL — AB (ref 8.9–10.3)
CHLORIDE: 101 meq/L (ref 96–112)
CHLORIDE: 104 mmol/L (ref 101–111)
CO2: 32 meq/L (ref 19–32)
CO2: 27 mmol/L (ref 22–32)
CREATININE: 0.89 mg/dL (ref 0.44–1.00)
Calcium: 9 mg/dL (ref 8.4–10.5)
Creatinine, Ser: 0.86 mg/dL (ref 0.40–1.20)
GFR calc Af Amer: >60 mL/min (ref >60)
GFR calc non Af Amer: >60 mL/min (ref >60)
GFR: 69.79 mL/min (ref >60.00)
GLUCOSE: 105 mg/dL — AB (ref 70–99)
GLUCOSE: 126 mg/dL — AB (ref 65–99)
POTASSIUM: 4 meq/L (ref 3.5–5.1)
Potassium: 3.7 mmol/L (ref 3.5–5.1)
SODIUM: 136 mmol/L (ref 135–145)
Sodium: 137 mEq/L (ref 135–145)
Total Bilirubin: 3 mg/dL — ABNORMAL HIGH (ref 0.2–1.2)
Total Bilirubin: 2.7 mg/dL — ABNORMAL HIGH (ref 0.3–1.2)
Total Protein: 6.6 g/dL (ref 6.0–8.3)
Total Protein: 6.7 g/dL (ref 6.5–8.1)

## 2016-07-11 LAB — CBC WITH DIFFERENTIAL/PLATELET
BASOS ABS: 0 10*3/uL (ref 0.0–0.1)
Basophils Relative: 0 %
EOS ABS: 0.5 10*3/uL (ref 0.0–0.7)
Eosinophils Relative: 3 %
HCT: 25.2 % — ABNORMAL LOW (ref 36.0–46.0)
HEMOGLOBIN: 7.9 g/dL — AB (ref 12.0–15.0)
LYMPHS PCT: 16 %
Lymphs Abs: 2.7 10*3/uL (ref 0.7–4.0)
MCH: 36.7 pg — AB (ref 26.0–34.0)
MCHC: 31.3 g/dL (ref 30.0–36.0)
MCV: 117.2 fL — ABNORMAL HIGH (ref 78.0–100.0)
MONOS PCT: 16 %
Monocytes Absolute: 2.7 10*3/uL — ABNORMAL HIGH (ref 0.1–1.0)
NEUTROS ABS: 11.1 10*3/uL — AB (ref 1.7–7.7)
NEUTROS PCT: 65 %
PLATELETS: 464 10*3/uL — AB (ref 150–400)
RBC: 2.15 MIL/uL — AB (ref 3.87–5.11)
RDW: 17.6 % — ABNORMAL HIGH (ref 11.5–15.5)
WBC: 17 10*3/uL — ABNORMAL HIGH (ref 4.0–10.5)

## 2016-07-11 LAB — MAGNESIUM: MAGNESIUM: 2.1 mg/dL (ref 1.7–2.4)

## 2016-07-11 LAB — C-REACTIVE PROTEIN: CRP: 6 mg/dL — ABNORMAL HIGH (ref <1.0)

## 2016-07-11 LAB — LACTIC ACID, PLASMA: Lactic Acid, Venous: 0.7 mmol/L (ref 0.5–1.9)

## 2016-07-11 MED ORDER — DEFERASIROX 500 MG PO TBSO: 1500.0000 mg | ORAL_TABLET | Freq: Every day | ORAL | Status: DC

## 2016-07-11 MED ORDER — ONDANSETRON HCL 4 MG PO TABS: 4.0000 mg | ORAL_TABLET | Freq: Four times a day (QID) | ORAL | Status: DC | PRN

## 2016-07-11 MED ORDER — DILTIAZEM HCL ER COATED BEADS 240 MG PO CP24
240.0000 mg | ORAL_CAPSULE | Freq: Every day | ORAL | Status: DC
  Administered 2016-07-11 – 2016-07-13 (×3): 240 mg via ORAL
  Filled 2016-07-11 (×3): qty 1

## 2016-07-11 MED ORDER — POTASSIUM CHLORIDE CRYS ER 20 MEQ PO TBCR
40.0000 meq | EXTENDED_RELEASE_TABLET | Freq: Every day | ORAL | Status: DC
  Administered 2016-07-11: 40 meq via ORAL
  Filled 2016-07-11 (×2): qty 2

## 2016-07-11 MED ORDER — FLUTICASONE PROPIONATE 50 MCG/ACT NA SUSP
1.0000 | Freq: Every day | NASAL | Status: DC
  Administered 2016-07-11 – 2016-07-13 (×3): 1 via NASAL
  Filled 2016-07-11: qty 16

## 2016-07-11 MED ORDER — BISOPROLOL FUMARATE 5 MG PO TABS
5.0000 mg | ORAL_TABLET | Freq: Two times a day (BID) | ORAL | Status: DC
  Administered 2016-07-11 – 2016-07-13 (×4): 5 mg via ORAL
  Filled 2016-07-11 (×4): qty 1

## 2016-07-11 MED ORDER — ONDANSETRON HCL 4 MG/2ML IJ SOLN: 4.0000 mg | Freq: Four times a day (QID) | INTRAMUSCULAR | Status: DC | PRN

## 2016-07-11 MED ORDER — SALINE SPRAY 0.65 % NA SOLN
1.0000 | Freq: Three times a day (TID) | NASAL | Status: DC | PRN
  Filled 2016-07-11: qty 44

## 2016-07-11 MED ORDER — FOLIC ACID 1 MG PO TABS
1.0000 mg | ORAL_TABLET | Freq: Every day | ORAL | Status: DC
  Administered 2016-07-11 – 2016-07-13 (×3): 1 mg via ORAL
  Filled 2016-07-11 (×3): qty 1

## 2016-07-11 MED ORDER — ALIGN 4 MG PO CAPS
4.0000 mg | ORAL_CAPSULE | Freq: Every day | ORAL | Status: DC
  Administered 2016-07-11 – 2016-07-13 (×3): 4 mg via ORAL
  Filled 2016-07-11 (×3): qty 1

## 2016-07-11 MED ORDER — VITAMIN D3 25 MCG (1000 UNIT) PO TABS
1000.0000 [IU] | ORAL_TABLET | Freq: Two times a day (BID) | ORAL | Status: DC
  Administered 2016-07-11 – 2016-07-13 (×5): 1000 [IU] via ORAL
  Filled 2016-07-11 (×5): qty 1

## 2016-07-11 MED ORDER — CLONAZEPAM 1 MG PO TABS
1.0000 mg | ORAL_TABLET | Freq: Every day | ORAL | Status: DC
  Administered 2016-07-11 – 2016-07-12 (×2): 2 mg via ORAL
  Filled 2016-07-11 (×2): qty 2

## 2016-07-11 MED ORDER — SPIRONOLACTONE 25 MG PO TABS
12.5000 mg | ORAL_TABLET | Freq: Every day | ORAL | Status: DC
  Administered 2016-07-12 – 2016-07-13 (×2): 12.5 mg via ORAL
  Filled 2016-07-11 (×2): qty 1

## 2016-07-11 MED ORDER — CALCIUM CARBONATE-VITAMIN D 500-200 MG-UNIT PO TABS
1.0000 | ORAL_TABLET | Freq: Two times a day (BID) | ORAL | Status: DC
  Administered 2016-07-11 – 2016-07-13 (×5): 1 via ORAL
  Filled 2016-07-11 (×5): qty 1

## 2016-07-11 MED ORDER — ACETAMINOPHEN 325 MG PO TABS: 650.0000 mg | ORAL_TABLET | Freq: Four times a day (QID) | ORAL | Status: DC | PRN

## 2016-07-11 MED ORDER — RALOXIFENE HCL 60 MG PO TABS
60.0000 mg | ORAL_TABLET | Freq: Every day | ORAL | Status: DC
  Administered 2016-07-11 – 2016-07-13 (×3): 60 mg via ORAL
  Filled 2016-07-11 (×3): qty 1

## 2016-07-11 MED ORDER — LORATADINE 10 MG PO TABS
10.0000 mg | ORAL_TABLET | Freq: Every day | ORAL | Status: DC
  Administered 2016-07-11 – 2016-07-13 (×3): 10 mg via ORAL
  Filled 2016-07-11 (×3): qty 1

## 2016-07-11 MED ORDER — GUAIFENESIN-DM 100-10 MG/5ML PO SYRP: 5.0000 mL | ORAL_SOLUTION | ORAL | Status: DC | PRN

## 2016-07-11 MED ORDER — ALBUTEROL SULFATE (2.5 MG/3ML) 0.083% IN NEBU
3.0000 mL | INHALATION_SOLUTION | Freq: Four times a day (QID) | RESPIRATORY_TRACT | Status: DC | PRN
  Administered 2016-07-12: 3 mL via RESPIRATORY_TRACT
  Filled 2016-07-11: qty 3

## 2016-07-11 MED ORDER — ACETAMINOPHEN 650 MG RE SUPP: 650.0000 mg | Freq: Four times a day (QID) | RECTAL | Status: DC | PRN

## 2016-07-11 MED ORDER — APIXABAN 5 MG PO TABS
5.0000 mg | ORAL_TABLET | Freq: Two times a day (BID) | ORAL | Status: DC
  Administered 2016-07-11 – 2016-07-13 (×4): 5 mg via ORAL
  Filled 2016-07-11 (×5): qty 1

## 2016-07-11 MED ORDER — TRAMADOL HCL 50 MG PO TABS
50.0000 mg | ORAL_TABLET | Freq: Four times a day (QID) | ORAL | Status: DC | PRN
  Administered 2016-07-11 – 2016-07-13 (×6): 50 mg via ORAL
  Filled 2016-07-11 (×6): qty 1

## 2016-07-11 MED ORDER — PANTOPRAZOLE SODIUM 40 MG PO TBEC
40.0000 mg | DELAYED_RELEASE_TABLET | Freq: Every day | ORAL | Status: DC
  Administered 2016-07-11 – 2016-07-13 (×3): 40 mg via ORAL
  Filled 2016-07-11 (×3): qty 1

## 2016-07-11 MED ORDER — FUROSEMIDE 10 MG/ML IJ SOLN
60.0000 mg | Freq: Every day | INTRAMUSCULAR | Status: DC
  Administered 2016-07-11 – 2016-07-12 (×2): 60 mg via INTRAVENOUS
  Filled 2016-07-11 (×2): qty 6

## 2016-07-11 MED ORDER — ESCITALOPRAM OXALATE 10 MG PO TABS
10.0000 mg | ORAL_TABLET | Freq: Every day | ORAL | Status: DC
  Administered 2016-07-11 – 2016-07-13 (×3): 10 mg via ORAL
  Filled 2016-07-11 (×3): qty 1

## 2016-07-11 MED ORDER — SENNOSIDES-DOCUSATE SODIUM 8.6-50 MG PO TABS: 1.0000 | ORAL_TABLET | Freq: Every evening | ORAL | Status: DC | PRN

## 2016-07-11 MED ORDER — FLUTICASONE FUROATE-VILANTEROL 100-25 MCG/INH IN AEPB
1.0000 | INHALATION_SPRAY | Freq: Every day | RESPIRATORY_TRACT | Status: DC
  Administered 2016-07-11 – 2016-07-13 (×3): 1 via RESPIRATORY_TRACT
  Filled 2016-07-11: qty 28

## 2016-07-11 NOTE — H&P (Signed)
History and Physical  [name] IRC:[id number] DOB: 07/10/1950 DOA: (Not on file)   PCP: Henrine Screws, MD   Patient coming from: Home  Chief Complaint: Fevers  HPI:  [name] is a 66 y.o. female with medical history of asthma, pressure or lung disease in the setting of a paralyzed hemidiaphragm and secondary pulmonary hypertension from these issues, resolved atrial fibrillation status post Maze procedure, essential hypertension, mitral regurgitation, status post mitral valve repair by Dr. [name] in February 2010 and depression was seen by Dr. Elsworth Soho in the office on 07/10/16 secondary to fevers. The patient has been having fevers for a little over 2 weeks. She states that she has had fevers up to 102.55F with coughing and green sputum and generalized body aches. The patient was seen by Dr. Elsworth Soho on 06/25/16 at which time, the patient was checked for influenza and given prednisone, oseltamivir and Augmentin for bronchitis.Marland Kitchen Her influenza PCR at that time was negative. Nevertheless, the patient finished 7 days of antimicrobials. The patient felt a little bit better, but continued to have fevers 2 days after finishing her course of medications. She states that her house temperature was on 07/08/2016 up to 102.55F. The patient continued to have nonproductive cough without hemoptysis. The patient denied any sore throat, dysphagia, nasal congestion, weight loss, chest pain, orthopnea, headache, neck pain, vomiting, diarrhea, abdominal pain, worsening back pain. Her oxygen requirements have not changed, and she continued on 2 L nasal cannula. However, the patient has complained of increasing dyspnea on exertion for the past 2-3 days. Chest x-ray on 07/10/2016 was negative for infiltrates. 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 Benay Spice.  She is on Exjade for Secondary iron overload.  Because of her  persistent fevers, direct admission was requested. Notably, the patient was recently discharged from the hospital after a stay from 04/02/2016 through 04/08/2016 for pneumonia and acute on chronic diastolic CHF. The patient was discharged home with torsemide 40 mg daily, and her discharge weight was 154 pounds.    Assessment/Plan: FUO -check lactic acid -check viral respiratory panel -07/10/16 CXR neg for infiltrates -If the patient's lactic acid is normal, plan to remain off of antibiotics as the patient is hemodynamically stable -UA/urine culture -if cultures negative and fevers persist, CT chest, abd/pelvis -concerned about neoplastic fever if cultures neg as pt has had mediastinal lymphadenopathy on prior CTs of chest--?lymphoma -06/25/16 rapid influenza negative -ESR, CRP -MRI T-spine if back pain persists and ESR/CRP significantly elevated as 04/07/16 MR showed T11-T12 edema anterior to vertebral bodies  Acute on Chronic diastolic CHF -give lasix 60 mg IV daily--being judicious as exacerbation is mild and trying to avoid dehydration in setting of FUO -clinically fluid overloaded with JVD and LE edema on exam -daily weights -unclear if weights during last admission were accurate -dry weight 152-154 -weight at home on 2/13= 157  Leukocytosis -?related to recent steroids -lactic acid -hemodynamically stable -add diff to CBC  Chronic respiratory failure with hypoxia -stable on 2 L -continue Breo  PAF--s/p RFA and Maze procedure Sept 2011 -Continue Diltazem 240 mg po, Bisoprolol 5 mg po BID and anticoagulation with Elquis.  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 Carmin Richmond on his consult list -may need steroids as there is risk of increase hemolysis with possible infection -monitor Hgb  Essential hypertension -Stable. Resume home medications  Obstructive sleep apnea -Continue nighttime CPAP.  Past  Medical History:  Diagnosis Date  . Allergic rhinitis   . Anemia   . Asthma   . Asthma   . Atrial fibrillation ([place])    s/p RFA and Maze procedure - resolved  . Atrial tachycardia ([place])   . Depression   . Dyspnea   . Exercise hypoxemia    supplemental O2 PRN  . Fatigue   . Gastritis    NSAID induced  . GERD (gastroesophageal reflux disease)   . Heart murmur    severe MR s/p MV repair  . Hx of asplenia    surgical  . Hypertension   . 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 Myoclonus   . Osteoporosis   . Pulmonary HTN    mild to moderate by [place] 05/2012  . Pyruvate kinase deficiency hemolytic anemia ([place])   . Syncope and collapse 2012   r/t anemia  . Trochanteric bursitis    left hip   Past Surgical History:  Procedure Laterality Date  . ABLATION     afib  . CARDIAC CATHETERIZATION N/A 10/29/2014   Procedure: Right Heart Cath;  Surgeon: Larey Dresser, MD;  Location: [facility];  Service: Cardiovascular;  Laterality: N/A;  . CARDIOVERSION N/A 09/11/2013   Procedure: CARDIOVERSION;  Surgeon: Jolaine Artist, MD;  Location: [facility] ENDOSCOPY;  Service: Cardiovascular;  Laterality: N/A;  . MAZE     with MV repair  . MITRAL VALVE REPAIR  01/2010   with MAZE  . myocardial window  2009   s/p afib ablation  . TEE WITHOUT CARDIOVERSION N/A 09/11/2013   Procedure: TRANSESOPHAGEAL ECHOCARDIOGRAM (TEE);  Surgeon: Jolaine Artist, MD;  Location: [facility] ENDOSCOPY;  Service: Cardiovascular;  Laterality: N/A;   Social History:  reports that she has never smoked. She has never used smokeless tobacco. She reports that she drinks alcohol. She reports that she does not use drugs.   Family History  Problem Relation Age of Onset  . Breast cancer Mother 75    ER+  . Melanoma Father 66  . Adrenal disorder Sister     pheochromacytoma - rt. adrenal gland  . COPD Maternal Aunt   . Stomach cancer Paternal Uncle     onset in 38s  . Lung cancer  Maternal Grandmother     died late 80s, heavy smoker  . Heart disease Maternal Grandfather     hardening of the arteries, poor circulation  . Ovarian cancer Sister 3    stage IV, now 12     Allergies  Allergen Reactions  . Iodine Anaphylaxis  . Shellfish Allergy Anaphylaxis  . Iohexol      Code: HIVES, Desc: reaction since childhood, Onset Date: [id number]   . Levaquin [Levofloxacin In D5w] Nausea And Vomiting    dizziness     Prior to Admission medications   Medication Sig Start Date End Date Taking? Authorizing Provider  albuterol (PROVENTIL HFA;VENTOLIN HFA) 108 (90 Base) MCG/ACT inhaler Inhale 2 puffs into the lungs every 6 (six) hours as needed for wheezing or shortness of breath. 03/14/16   Marijean Heath, NP  apixaban (ELIQUIS) 5 MG TABS tablet Take 1 tablet (5 mg total) by mouth 2 (two) times daily. 02/14/16   Larey Dresser, MD  bisoprolol (ZEBETA) 5 MG tablet TAKE 1 TABLET(5 MG) BY MOUTH TWICE DAILY 02/14/16   Larey Dresser, MD  calcium-vitamin D (OSCAL WITH D) 500-200 MG-UNIT tablet Take 1 tablet by mouth  2 (two) times daily.    Historical Provider, MD  cefdinir (OMNICEF) 300 MG capsule Take 1 capsule (300 mg total) by mouth 2 (two) times daily. 07/10/16   Oretha Milch, MD  CHLORPHENIRAMINE MALEATE PO Take 4 mg by mouth at bedtime.    Historical Provider, MD  cholecalciferol (VITAMIN D) 1000 UNITS tablet Take 1,000 Units by mouth 2 (two) times daily.     Historical Provider, MD  clonazePAM (KLONOPIN) 1 MG tablet Take 1-2 mg by mouth at bedtime.     Historical Provider, MD  deferasirox (EXJADE) 500 MG disintegrating tablet Take 3 tablets (1,500 mg total) by mouth daily before breakfast. 06/07/16   Ladene Artist, MD  diclofenac sodium (VOLTAREN) 1 % GEL Apply 2 g topically 4 (four) times daily as needed. 04/08/16   Omair Latif Sheikh, DO  diltiazem (CARTIA XT) 240 MG 24 hr capsule Take 1 capsule (240 mg total) by mouth daily. 07/10/16   Dolores Patty, MD    EPINEPHrine (EPI-PEN) 0.3 mg/0.3 mL DEVI Inject 0.3 mg into the muscle once as needed.    Historical Provider, MD  escitalopram (LEXAPRO) 10 MG tablet Take 10 mg by mouth daily.  03/03/14   Historical Provider, MD  fexofenadine (ALLEGRA) 180 MG tablet Take 180 mg by mouth daily.     Historical Provider, MD  fluticasone (FLONASE) 50 MCG/ACT nasal spray Place 1 spray into both nostrils daily.    Historical Provider, MD  fluticasone furoate-vilanterol (BREO ELLIPTA) 100-25 MCG/INH AEPB INHALE 1 PUFF BY MOUTH EVERY DAY 06/07/16   Leslye Peer, MD  folic acid (FOLVITE) 1 MG tablet Take 1 mg by mouth daily.    Historical Provider, MD  guaiFENesin-dextromethorphan (ROBITUSSIN DM) 100-10 MG/5ML syrup Take 5 mLs by mouth every 4 (four) hours as needed for cough. 04/08/16   Merlene Laughter, DO  Multiple Vitamins-Minerals (AIRBORNE) CHEW Chew 1 tablet by mouth 2 (two) times daily.     Historical Provider, MD  NON FORMULARY Inhale 2 L into the lungs continuous. Oxygen at 2 l/min [place]    Historical Provider, MD  pantoprazole (PROTONIX) 40 MG tablet Take 40 mg by mouth daily.    Historical Provider, MD  potassium chloride SA (K-DUR,KLOR-CON) 20 MEQ tablet Take 2 tablets (40 mEq total) by mouth daily. 06/06/16   Dolores Patty, MD  Probiotic Product (ALIGN) 4 MG CAPS Take 1 tablet by mouth daily.    Historical Provider, MD  raloxifene (EVISTA) 60 MG tablet Take 60 mg by mouth daily.    Historical Provider, MD  senna-docusate (SENOKOT-S) 8.6-50 MG tablet Take 1 tablet by mouth at bedtime as needed for mild constipation. 04/08/16   Merlene Laughter, DO  sodium chloride (OCEAN) 0.65 % SOLN nasal spray Place 1 spray into both nostrils 3 (three) times daily as needed for congestion.    Historical Provider, MD  spironolactone (ALDACTONE) 25 MG tablet TAKE 1/2 TABLET(12.5 MG) BY MOUTH DAILY 01/25/16   Laurey Morale, MD  torsemide (DEMADEX) 20 MG tablet Take 2 tablets (40 mg total) by mouth daily. 06/28/16   Dolores Patty, MD  traMADol (ULTRAM) 50 MG tablet Take 50 mg by mouth every 6 (six) hours as needed for moderate pain. 3-4 times daily    Historical Provider, MD  UNABLE TO FIND Med Name: CPAP    Historical Provider, MD    Review of Systems:  Constitutional:  No weight loss, night sweats. Head&Eyes: No headache.  No vision loss.  No eye pain or scotoma ENT:  No Difficulty swallowing,Tooth/dental problems,Sore throat,  No ear ache, post nasal drip,  Cardio-vascular:  No chest pain, Orthopnea, PND,   dizziness, palpitations  GI:  No  abdominal pain, nausea, vomiting, diarrhea, loss of appetite, hematochezia, melena, heartburn, indigestion, Resp:  No shortness of breath with exertion or at rest. No cough. No coughing up of blood .No wheezing.No chest wall deformity  Skin:  no rash or lesions.  GU:  no dysuria, change in color of urine, no urgency or frequency. No flank pain.  Musculoskeletal:  No joint pain or swelling. No decreased range of motion. No back pain.  Psych:  No change in mood or affect.  Neurologic: No headache, no dysesthesia, no focal weakness, no vision loss. No syncope  Physical Exam: There were no vitals filed for this visit. General:  A&O x 3, NAD, nontoxic, pleasant/cooperative Head/Eye: No conjunctival hemorrhage, no icterus, [place]/AT, No nystagmus ENT:  No icterus,  No thrush, good dentition, no pharyngeal exudate Neck:  No masses, no lymphadenpathy, no bruits CV:  RRR, no rub, no gallop, no S3, +JVD Lung:  Bibasilar crackles., good air movement, no wheeze, no rhonchi Abdomen: soft/NT, +BS, nondistended, no peritoneal signs Ext: No cyanosis, No rashes, No petechiae, No lymphangitis, 1 + LE edema Neuro: CNII-XII intact, strength 4/5 in bilateral upper and lower extremities, no dysmetria  Labs on Admission:  Basic Metabolic Panel: No results for input(s): NA, K, CL, CO2, GLUCOSE, BUN, CREATININE, CALCIUM, MG, PHOS in the last 168 hours. Liver Function  Tests: No results for input(s): AST, ALT, ALKPHOS, BILITOT, PROT, ALBUMIN in the last 168 hours. No results for input(s): LIPASE, AMYLASE in the last 168 hours. No results for input(s): AMMONIA in the last 168 hours. CBC: No results for input(s): WBC, NEUTROABS, HGB, HCT, MCV, PLT in the last 168 hours. Coagulation Profile: No results for input(s): INR, PROTIME in the last 168 hours. Cardiac Enzymes: No results for input(s): CKTOTAL, CKMB, CKMBINDEX, TROPONINI in the last 168 hours. BNP: Invalid input(s): POCBNP CBG: No results for input(s): GLUCAP in the last 168 hours. Urine analysis:    Component Value Date/Time   COLORURINE YELLOW 04/02/2016 1624   APPEARANCEUR CLEAR 04/02/2016 1624   LABSPEC 1.017 04/02/2016 1624   LABSPEC 1.015 04/27/2010 1236   PHURINE 5.5 04/02/2016 1624   GLUCOSEU NEGATIVE 04/02/2016 1624   HGBUR NEGATIVE 04/02/2016 1624   BILIRUBINUR NEGATIVE 04/02/2016 1624   BILIRUBINUR Negative 04/27/2010 1236   KETONESUR NEGATIVE 04/02/2016 1624   PROTEINUR NEGATIVE 04/02/2016 1624   UROBILINOGEN 1.0 09/08/2013 1323   NITRITE NEGATIVE 04/02/2016 1624   LEUKOCYTESUR NEGATIVE 04/02/2016 1624   LEUKOCYTESUR Negative 04/27/2010 1236   Sepsis Labs: _0 (procalcitonin:4,lacticidven:4) )No results found for this or any previous visit (from the past 240 hour(s)).   Radiological Exams on Admission: Dg Chest 2 View  Result Date: 07/11/2016 CLINICAL DATA:  Cough, fever for 3 days EXAM: CHEST  2 VIEW COMPARISON:  04/26/2016 FINDINGS: Cardiomegaly. Prior valve replacement. Mild vascular congestion. Bibasilar atelectasis. No overt edema. No effusions or acute bony abnormality. IMPRESSION: Cardiomegaly with vascular congestion and bibasilar atelectasis. Electronically Signed   By: Rolm Baptise M.D.   On: 07/11/2016 08:14    EKG: Independently reviewed. none    Time spent:70 minutes Code Status:   FULL Family Communication:  No Family at bedside Disposition Plan:  expect 2-3 day hospitalization Consults called: Heme/Onc DVT Prophylaxis: apixaban  Eldrick Penick, DO  [facility] Pager [phone]  If 7PM-7AM,  please contact night-coverage [email or web address] Password [facility] 07/11/2016, 10:24 AM

## 2016-07-11 NOTE — Telephone Encounter (Signed)
Pl call to check on this patient Let her know WBC count is high I would like her to get admitted for IV antibiotics - pl get med surg bed at [facility] let me know when done

## 2016-07-11 NOTE — Telephone Encounter (Signed)
Spoke with Dawn with [facility], who was able to get a bed for pt at [facility] was unsure of rm # at this moment. I have spoke with pt, and made her aware to check in at preadmit. Pt voiced her understanding, with no further questions.   Will route to RA as a FYI.

## 2016-07-11 NOTE — Progress Notes (Signed)
RT set up patient home unit CPAP. 2L O2 bleed In needed. Patient needs no assistance in putting on mask.

## 2016-07-11 NOTE — Telephone Encounter (Signed)
Asked hospitalist team to admit- spoke to dr Tat

## 2016-07-12 DIAGNOSIS — I5033 Acute on chronic diastolic (congestive) heart failure: Secondary | ICD-10-CM

## 2016-07-12 DIAGNOSIS — D594 Other nonautoimmune hemolytic anemias: Secondary | ICD-10-CM

## 2016-07-12 DIAGNOSIS — Z7901 Long term (current) use of anticoagulants: Secondary | ICD-10-CM

## 2016-07-12 DIAGNOSIS — J9611 Chronic respiratory failure with hypoxia: Secondary | ICD-10-CM

## 2016-07-12 DIAGNOSIS — D589 Hereditary hemolytic anemia, unspecified: Secondary | ICD-10-CM

## 2016-07-12 DIAGNOSIS — I480 Paroxysmal atrial fibrillation: Secondary | ICD-10-CM

## 2016-07-12 DIAGNOSIS — E876 Hypokalemia: Secondary | ICD-10-CM

## 2016-07-12 DIAGNOSIS — I272 Pulmonary hypertension, unspecified: Secondary | ICD-10-CM

## 2016-07-12 LAB — COMPREHENSIVE METABOLIC PANEL
ALK PHOS: 64 U/L (ref 38–126)
ALT: 15 U/L (ref 14–54)
AST: 19 U/L (ref 15–41)
Albumin: 3.3 g/dL — ABNORMAL LOW (ref 3.5–5.0)
Anion gap: 5 (ref 5–15)
BUN: 14 mg/dL (ref 6–20)
CALCIUM: 8.6 mg/dL — AB (ref 8.9–10.3)
CHLORIDE: 101 mmol/L (ref 101–111)
CO2: 30 mmol/L (ref 22–32)
CREATININE: 0.82 mg/dL (ref 0.44–1.00)
GFR calc Af Amer: >60 mL/min (ref >60)
Glucose, Bld: 95 mg/dL (ref 65–99)
Potassium: 3.3 mmol/L — ABNORMAL LOW (ref 3.5–5.1)
Sodium: 136 mmol/L (ref 135–145)
Total Bilirubin: 2.7 mg/dL — ABNORMAL HIGH (ref 0.3–1.2)
Total Protein: 6.4 g/dL — ABNORMAL LOW (ref 6.5–8.1)

## 2016-07-12 LAB — CBC WITH DIFFERENTIAL/PLATELET
BASOS PCT: 0 %
Basophils Absolute: 0 10*3/uL (ref 0.0–0.1)
EOS ABS: 0.7 10*3/uL (ref 0.0–0.7)
EOS PCT: 4 %
HCT: 23.3 % — ABNORMAL LOW (ref 36.0–46.0)
Hemoglobin: 7.4 g/dL — ABNORMAL LOW (ref 12.0–15.0)
LYMPHS ABS: 3.5 10*3/uL (ref 0.7–4.0)
Lymphocytes Relative: 21 %
MCH: 37 pg — AB (ref 26.0–34.0)
MCHC: 31.8 g/dL (ref 30.0–36.0)
MCV: 116.5 fL — ABNORMAL HIGH (ref 78.0–100.0)
MONO ABS: 2.8 10*3/uL — AB (ref 0.1–1.0)
Monocytes Relative: 17 %
Neutro Abs: 9.5 10*3/uL — ABNORMAL HIGH (ref 1.7–7.7)
Neutrophils Relative %: 58 %
PLATELETS: 417 10*3/uL — AB (ref 150–400)
RBC: 2 MIL/uL — AB (ref 3.87–5.11)
RDW: 18 % — AB (ref 11.5–15.5)
WBC: 16.5 10*3/uL — ABNORMAL HIGH (ref 4.0–10.5)

## 2016-07-12 MED ORDER — IPRATROPIUM-ALBUTEROL 0.5-2.5 (3) MG/3ML IN SOLN
3.0000 mL | Freq: Three times a day (TID) | RESPIRATORY_TRACT | Status: DC
  Administered 2016-07-12 – 2016-07-13 (×3): 3 mL via RESPIRATORY_TRACT
  Filled 2016-07-12 (×3): qty 3

## 2016-07-12 MED ORDER — DEFERASIROX 500 MG PO TBSO
1500.0000 mg | ORAL_TABLET | Freq: Every day | ORAL | Status: DC
  Administered 2016-07-12: 1500 mg via ORAL

## 2016-07-12 MED ORDER — POTASSIUM CHLORIDE CRYS ER 20 MEQ PO TBCR
40.0000 meq | EXTENDED_RELEASE_TABLET | Freq: Two times a day (BID) | ORAL | Status: DC
  Administered 2016-07-12 – 2016-07-13 (×3): 40 meq via ORAL
  Filled 2016-07-12 (×2): qty 2

## 2016-07-12 MED ORDER — IPRATROPIUM-ALBUTEROL 0.5-2.5 (3) MG/3ML IN SOLN
3.0000 mL | Freq: Four times a day (QID) | RESPIRATORY_TRACT | Status: DC
Start: 2016-07-12 — End: 2016-07-12

## 2016-07-12 MED ORDER — DEFERASIROX 500 MG PO TBSO: 1500.0000 mg | ORAL_TABLET | Freq: Every day | ORAL | Status: DC

## 2016-07-12 NOTE — Progress Notes (Signed)
Pt has home CPAP at bedside and states that she will self administer when ready for bed.  RT to monitor and assess as needed.

## 2016-07-12 NOTE — Progress Notes (Signed)
PROGRESS NOTE    [name]  IRJ:[id number] DOB: [birth year] DOA: 07/11/2016 PCP: Henrine Screws, MD   Brief Narrative:  [name] is a 66 y.o. 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 [name] in February 2010 and depression was seen by Dr. Elsworth Soho in the office on 07/10/16 secondary to fevers. The patient has been having fevers for a little over 2 weeks. She states that she has had fevers up to 102.55F with coughing and green sputum and generalized body aches. The patient was seen by Dr. Elsworth Soho on 06/25/16 at which time, the patient was checked for influenza and given prednisone, oseltamivir and Augmentin for bronchitis.Marland Kitchen Her influenza PCR at that time was negative. Nevertheless, the patient finished 7 days of antimicrobials. The patient felt a little bit better, but continued to have fevers 2 days after finishing her course of medications. She states that her temperature was on 07/08/2016 up to 102.55F. The patient continued to have nonproductive cough without hemoptysis. The patient denied any sore throat, dysphagia, nasal congestion, weight loss, chest pain, orthopnea, headache, neck pain, vomiting, diarrhea, abdominal pain, worsening back pain. Her oxygen requirements have not changed, and she continued on 2 L nasal cannula. However, the patient has complained of increasing dyspnea on exertion for the past 2-3 days. Chest x-ray on 07/10/2016 was negative for infiltrates. 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 Benay Spice.  She is on Exjade for Secondary iron overload.  Because of her persistent fevers, direct admission was requested. Notably, the patient was recently  discharged from the hospital after a stay from 04/02/2016 through 04/08/2016 for pneumonia and acute on chronic diastolic CHF. The patient was discharged home with torsemide 40 mg daily, and her discharge weight was 154 pounds. She felt better and fever improved without Abx. Continue to monitor and possible D/C home in AM.  Assessment & Plan:   Active Problems:    hemolytic anemia   PAF- recurrent, Maze 9/11   Pulmonary HTN   Chronic anticoagulation- Eliquis started 08/26/13   Chronic respiratory failure (HCC)   FUO (fever of unknown origin)   Acute on chronic diastolic CHF (congestive heart failure) (HCC)   Hemolytic anemia (HCC)  Fever/SIRS likely 2/2 to Viral Origin/URI -Improved and patient has defervesced; Concern was for fever in an asplenic Patient -WBC improved without Abx and went from 19.5 -> 16.5 -Lactic Level 0.7 -Viral respiratory panel Negative -07/10/16 CXR neg for infiltrates -UA unimpressive for UTI and Urine culture Pending -Initial Blood Cx in Hospital showed NGTD <24 hours -Blood Cx drawn at Dr. Bari Mantis office on 2/13 show NGTD -If cultures negative and fevers persist, CT chest, abd/pelvis -concerned about neoplastic fever if cultures neg as pt has had mediastinal lymphadenopathy on prior CTs of chest--?lymphoma -06/25/16 rapid influenza negative and POC Influenza A and B Negative -ESR 3, CRP slightly high at 6.0 -MRI T-spine if back pain persists and ESR/CRP significantly elevated as 04/07/16 MR showed T11-T12 edema anterior to vertebral bodies -Low Threshold to start Abx with at least Ceftriaxone  Acute on Chronic diastolic CHF -C/w lasix 60 mg IV daily--being judicious as exacerbation is mild and trying to avoid dehydration in setting -Clinically fluid overloaded with JVD and LE edema on exam -daily weights -  unclear if weights during last admission were accurate -dry weight 152-154 -weight at home on 2/13= 157 -Strict I's and O's and Daily weights -Reassess  Volume Status  Leukocytosis -Improving; WBC went from 19.5 -> 16.5 -hemodynamically stable -Discussed with Dr. Benay Spice of Oncology and patient always has some WBC given Pyruvate Kinase Deficiency  Hypokalemia -Potassium was 3.3 -Replete -Repeat CMP in AM  Chronic respiratory failure with hypoxia -stable on 2 L -continue Breo Ellipta and Fluticasone -C/w DuoNebs scheduled TID and Albuterol 3 mL IH q6hprn for Wheezing and SOB  PAF--s/p RFA and Maze procedure Sept 2011 -Continue Diltazem 240 mg po, Bisoprolol 5 mg po BID and anticoagulation with Elquis.  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 Benay Spice; Discussed with Dr. Benay Spice and recommended not transfusing and no indication for steroids;  -Repeat CBC with Diff in AM  -C/w Deferasirox 1500 mg po daily for 2/2 Iron overload -Continue to Monitor Closely  Essential Hypertension -Stable. Resume home medications  Obstructive sleep apnea -Continue nighttime CPAP.  Hyperbilirubinemia -Review of Prievous Records show Bilirubin is almost elevated -Likely 2/2 to Pyruvate Kinase Deficiency  -Continue to Monitor CMP's   DVT prophylaxis: Anticoagulated with Eliquis Code Status: FULL CODE Family Communication: No Family present at bedside Disposition Plan: Home in AM if Stable and no Fever with WBC improving  Consultants:   Discussed with Dr. Benay Spice of Oncology   Procedures: None  Antimicrobials:  Anti-infectives    None     Subjective: Seen and examined and feeling better. No Nausea/Vomiting. Not coughing much. No other concerns or complaints and diuresing well with Furosemide.   Objective: Vitals:   07/11/16 1449 07/11/16 2010 07/11/16 2214 07/12/16 0414  BP: 117/68 131/69  115/65  Pulse: 63 61  68  Resp: '20 19  19  ' Temp: 98.6 F (37 C) 99.1 F (37.3 C)  99.2 F (37.3 C)  TempSrc: Oral Oral  Oral  SpO2: 97% 94% 94% 91%  Weight:    75.5 kg (166  lb 7.2 oz)  Height:        Intake/Output Summary (Last 24 hours) at 07/12/16 0850 Last data filed at 07/12/16 0424  Gross per 24 hour  Intake              480 ml  Output             2800 ml  Net            -2320 ml   Filed Weights   07/11/16 1100 07/12/16 0414  Weight: 72.8 kg (160 lb 8 oz) 75.5 kg (166 lb 7.2 oz)   Examination: Physical Exam:  Constitutional: WN/WD, NAD and appears calm and comfortable Eyes: Llids and conjunctivae normal ENMT: External Ears, Nose appear normal. Grossly normal hearing. Mucous membranes are moist.  Neck: Appears normal, supple, no cervical masses, normal ROM, no appreciable thyromegaly Respiratory: Diminished to auscultation bilaterally, no wheezing, rales, rhonchi or crackles. Normal respiratory effort and patient is not tachypenic. No accessory muscle use.  Cardiovascular: RRR, no murmurs / rubs / gallops. S1 and S2 auscultated. Mild Lower Extremity Edema Abdomen: Soft, non-tender, non-distended. No masses palpated. No appreciable hepatosplenomegaly. Bowel sounds positive x4.  GU: Deferred. Musculoskeletal: No clubbing / cyanosis of digits/nails. No joint deformity upper and lower extremities.  Skin: No rashes, lesions, ulcers on limited skin evaluation. No induration; Warm and dry.  Neurologic: CN 2-12 grossly intact with no focal deficits. Sensation intact in all 4 Extremities. Romberg sign cerebellar reflexes  not assessed.  Psychiatric: Normal judgment and insight. Alert and oriented x 3. Normal mood and appropriate affect.   Data Reviewed: I have personally reviewed following labs and imaging studies  CBC:  Recent Labs Lab 07/10/16 1714 07/11/16 1349 07/12/16 0814  WBC 19.5 Repeated and verified X2.* 17.0* 16.5*  NEUTROABS 13.0* 11.1* PENDING  HGB 8.1* 7.9* 7.4*  HCT 25.8* 25.2* 23.3*  MCV 116.2* 117.2* 116.5*  PLT 446.0* 464* 092*   Basic Metabolic Panel:  Recent Labs Lab 07/10/16 1714 07/11/16 1349  NA 137 136  K 4.0 3.7    CL 101 104  CO2 32 27  GLUCOSE 105* 126*  BUN 12 12  CREATININE 0.86 0.89  CALCIUM 9.0 8.7*  MG  --  2.1   GFR: Estimated Creatinine Clearance: 58.4 mL/min (by C-G formula based on SCr of 0.89 mg/dL). Liver Function Tests:  Recent Labs Lab 07/10/16 1714 07/11/16 1349  AST 19 21  ALT 14 16  ALKPHOS 80 81  BILITOT 3.0* 2.7*  PROT 6.6 6.7  ALBUMIN 3.7 3.7   No results for input(s): LIPASE, AMYLASE in the last 168 hours. No results for input(s): AMMONIA in the last 168 hours. Coagulation Profile: No results for input(s): INR, PROTIME in the last 168 hours. Cardiac Enzymes: No results for input(s): CKTOTAL, CKMB, CKMBINDEX, TROPONINI in the last 168 hours. BNP (last 3 results) No results for input(s): PROBNP in the last 8760 hours. HbA1C: No results for input(s): HGBA1C in the last 72 hours. CBG: No results for input(s): GLUCAP in the last 168 hours. Lipid Profile: No results for input(s): CHOL, HDL, LDLCALC, TRIG, CHOLHDL, LDLDIRECT in the last 72 hours. Thyroid Function Tests: No results for input(s): TSH, T4TOTAL, FREET4, T3FREE, THYROIDAB in the last 72 hours. Anemia Panel: No results for input(s): VITAMINB12, FOLATE, FERRITIN, TIBC, IRON, RETICCTPCT in the last 72 hours. Sepsis Labs:  Recent Labs Lab 07/11/16 1349  LATICACIDVEN 0.7    Recent Results (from the past 240 hour(s))  Respiratory Panel by PCR     Status: None   Collection Time: 07/11/16 12:09 PM  Result Value Ref Range Status   Adenovirus NOT DETECTED NOT DETECTED Final   Coronavirus 229E NOT DETECTED NOT DETECTED Final   Coronavirus HKU1 NOT DETECTED NOT DETECTED Final   Coronavirus NL63 NOT DETECTED NOT DETECTED Final   Coronavirus OC43 NOT DETECTED NOT DETECTED Final   Metapneumovirus NOT DETECTED NOT DETECTED Final   Rhinovirus / Enterovirus NOT DETECTED NOT DETECTED Final   Influenza A NOT DETECTED NOT DETECTED Final   Influenza B NOT DETECTED NOT DETECTED Final   Parainfluenza Virus 1  NOT DETECTED NOT DETECTED Final   Parainfluenza Virus 2 NOT DETECTED NOT DETECTED Final   Parainfluenza Virus 3 NOT DETECTED NOT DETECTED Final   Parainfluenza Virus 4 NOT DETECTED NOT DETECTED Final   Respiratory Syncytial Virus NOT DETECTED NOT DETECTED Final   Bordetella pertussis NOT DETECTED NOT DETECTED Final   Chlamydophila pneumoniae NOT DETECTED NOT DETECTED Final   Mycoplasma pneumoniae NOT DETECTED NOT DETECTED Final    Comment: Performed at [facility], [place] [address], [place], [place] [address]    Radiology Studies: Dg Chest 2 View  Result Date: 07/11/2016 CLINICAL DATA:  Cough, fever for 3 days EXAM: CHEST  2 VIEW COMPARISON:  04/26/2016 FINDINGS: Cardiomegaly. Prior valve replacement. Mild vascular congestion. Bibasilar atelectasis. No overt edema. No effusions or acute bony abnormality. IMPRESSION: Cardiomegaly with vascular congestion and bibasilar atelectasis. Electronically Signed  By: Rolm Baptise M.D.   On: 07/11/2016 08:14   Scheduled Meds: . ALIGN  4 mg Oral Daily  . apixaban  5 mg Oral BID  . bisoprolol  5 mg Oral BID  . calcium-vitamin D  1 tablet Oral BID  . cholecalciferol  1,000 Units Oral BID  . clonazePAM  1-2 mg Oral QHS  . deferasirox  1,500 mg Oral QAC breakfast  . diltiazem  240 mg Oral Daily  . escitalopram  10 mg Oral Daily  . fluticasone  1 spray Each Nare Daily  . fluticasone furoate-vilanterol  1 puff Inhalation Daily  . folic acid  1 mg Oral Daily  . furosemide  60 mg Intravenous Daily  . loratadine  10 mg Oral Daily  . pantoprazole  40 mg Oral Daily  . potassium chloride SA  40 mEq Oral Daily  . raloxifene  60 mg Oral Daily  . spironolactone  12.5 mg Oral Daily   Continuous Infusions:   LOS: 1 day   Kerney Elbe, DO [facility] Pager [phone]  If 7PM-7AM, please contact night-coverage [email or web address] Password [email or web address] 07/12/2016, 8:50 AM

## 2016-07-13 ENCOUNTER — Other Ambulatory Visit (HOSPITAL_COMMUNITY): Payer: Self-pay | Admitting: Cardiology

## 2016-07-13 DIAGNOSIS — R6510 Systemic inflammatory response syndrome (SIRS) of non-infectious origin without acute organ dysfunction: Secondary | ICD-10-CM

## 2016-07-13 LAB — CBC WITH DIFFERENTIAL/PLATELET
BASOS PCT: 1 %
Basophils Absolute: 0.1 10*3/uL (ref 0.0–0.1)
Eosinophils Absolute: 0.6 10*3/uL (ref 0.0–0.7)
Eosinophils Relative: 4 %
HEMATOCRIT: 23.6 % — AB (ref 36.0–46.0)
HEMOGLOBIN: 7.3 g/dL — AB (ref 12.0–15.0)
LYMPHS PCT: 20 %
Lymphs Abs: 2.9 10*3/uL (ref 0.7–4.0)
MCH: 36 pg — AB (ref 26.0–34.0)
MCHC: 30.9 g/dL (ref 30.0–36.0)
MCV: 116.3 fL — ABNORMAL HIGH (ref 78.0–100.0)
MONOS PCT: 17 %
Monocytes Absolute: 2.5 10*3/uL — ABNORMAL HIGH (ref 0.1–1.0)
NEUTROS ABS: 8.6 10*3/uL — AB (ref 1.7–7.7)
Neutrophils Relative %: 58 %
Platelets: 401 10*3/uL — ABNORMAL HIGH (ref 150–400)
RBC: 2.03 MIL/uL — ABNORMAL LOW (ref 3.87–5.11)
RDW: 18.1 % — ABNORMAL HIGH (ref 11.5–15.5)
WBC: 14.7 10*3/uL — ABNORMAL HIGH (ref 4.0–10.5)

## 2016-07-13 LAB — COMPREHENSIVE METABOLIC PANEL
ALK PHOS: 67 U/L (ref 38–126)
ALT: 13 U/L — ABNORMAL LOW (ref 14–54)
ANION GAP: 7 (ref 5–15)
AST: 19 U/L (ref 15–41)
Albumin: 3.4 g/dL — ABNORMAL LOW (ref 3.5–5.0)
BILIRUBIN TOTAL: 3.6 mg/dL — AB (ref 0.3–1.2)
BUN: 21 mg/dL — ABNORMAL HIGH (ref 6–20)
CALCIUM: 8.6 mg/dL — AB (ref 8.9–10.3)
CO2: 28 mmol/L (ref 22–32)
CREATININE: 0.95 mg/dL (ref 0.44–1.00)
Chloride: 100 mmol/L — ABNORMAL LOW (ref 101–111)
GFR calc non Af Amer: >60 mL/min (ref >60)
GLUCOSE: 102 mg/dL — AB (ref 65–99)
Potassium: 4.1 mmol/L (ref 3.5–5.1)
Sodium: 135 mmol/L (ref 135–145)
TOTAL PROTEIN: 6.7 g/dL (ref 6.5–8.1)

## 2016-07-13 LAB — MAGNESIUM: Magnesium: 1.8 mg/dL (ref 1.7–2.4)

## 2016-07-13 LAB — URINE CULTURE

## 2016-07-13 LAB — PHOSPHORUS: Phosphorus: 3.5 mg/dL (ref 2.5–4.6)

## 2016-07-13 MED ORDER — DILTIAZEM HCL ER COATED BEADS 240 MG PO CP24: 240.0000 mg | ORAL_CAPSULE | Freq: Every day | ORAL | 3 refills | Status: DC

## 2016-07-13 MED ORDER — TORSEMIDE 20 MG PO TABS
40.0000 mg | ORAL_TABLET | Freq: Every day | ORAL | Status: DC
  Administered 2016-07-13: 40 mg via ORAL
  Filled 2016-07-13: qty 2

## 2016-07-13 MED ORDER — DEXTROMETHORPHAN POLISTIREX ER 30 MG/5ML PO SUER
60.0000 mg | Freq: Every evening | ORAL | Status: DC | PRN
  Filled 2016-07-13: qty 10

## 2016-07-13 NOTE — Discharge Summary (Signed)
Physician Discharge Summary  DAREN [name] VAC:[id number] DOB: Oct 07, 1950 DOA: 07/11/2016  PCP: Amanda Dubonnet, MD  Admit date: 07/11/2016 Discharge date: 07/13/2016  Admitted From: Home Disposition:  Home  Recommendations for Outpatient Follow-up:  1. Follow up with PCP in 1-2 weeks 2. Follow up with Cardiology as an outpatient within 1-2 weeks 3. Follow up with Hematology/Oncology Dr. Truett [name] in 1 week 4. Please obtain BMP/CBC in one week 5. Discuss with Dr. 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 Cornelius [name] in February 2010, Depression, and other comorbidities who was seen by Dr. Vassie [name] in the office on 2/13/18secondary to fevers. The patient has been having fevers for a little over 2 weeks. She states that she has had fevers up to 102.4Fwith coughing and green sputum and generalized body aches. The patient was seen by Dr. Vassie [name] on 1/29/18at which time, the patient was checked for influenza and given prednisone, oseltamivir and Augmentinfor bronchitis.Marland Kitchen Her influenza PCR at that time was negative. Nevertheless, the patient finished 7 daysof antimicrobials. The patient felt a little bit better, but continued to have fevers2 days after finishing her course of medications. She states that her temperature was on 07/08/2016 up to 102.4F. The patient continued to have nonproductive cough without hemoptysis. The  patient denied any sore throat, dysphagia, nasal congestion, weight loss, chest pain, orthopnea, headache, neck pain, vomiting, diarrhea, abdominal pain, worsening back pain. Her oxygen requirements have not changed, and she continued on 2 L nasal cannula. However, the patient has complained of increasing dyspnea on exertion for the past 2-3 days. Chest x-ray on 07/10/2016 was negative for infiltrates. 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 Truett [name]. She is on Exjade for Secondary iron overload. Because of her persistent fevers, direct admission was requested. Notably, the patient was recently discharged from the hospital after a stay from 04/02/2016 through 04/08/2016 for pneumonia and acute on chronic diastolic CHF. The patient was discharged home with torsemide 40 mg daily, and her discharge weight was 154pounds. Over the past two days patient has been afebrile and Leukocytosis has been resolving without patient being started on any Abx. She likely had a viral infection and is improving. At this time she is deemed medically stable for discharge and will follow up with PCP, Hematology/Oncology, and with Cardiology as an outpatient.   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 URI) -Improved and patient has defervesced; Concern was for fever in an asplenic Patient -WBC improved without Abx and went from 19.5 -> 16.5 -> 14.7 -Lactic Level 0.7 -Viral respiratory panel Negative -07/10/16 CXR neg for infiltrates -UA unimpressive for UTI and Urine  culture showed Multiple Species Present  (asymptomatic) -Initial Blood Cx in Hospital showed NGTD at 2 Days -Blood Cx drawn at [name] office on 2/13 show NGTD at 3 Days -If cultures negative and fevers persist, will need CT chest, abd/pelvis however patient has not been febrile -concerned about neoplastic fever if cultures neg as pt has had mediastinal lymphadenopathy on prior CTs of chest--?lymphoma -> Follows with Dr. Julieanne [name] will need to follow up as an outpatient -06/25/16 rapid influenza negative and POC Influenza A and B Negative -ESR 3, CRP slightly high at 6.0 -MRI T-spine if back pain persists and ESR/CRP significantly elevated as 04/07/16 MR showed T11-T12 edema anterior to vertebral bodies -Low Threshold to start Abx with at least Ceftriaxone however WBC improved and patient afebrile and asymptomatic   Acute on Chronic diastolic CHF, improved -Llasix 60 mg IV daily changed to Home Torsemide -Was Clinically fluid overloaded with JVD and LE edema on exam -Daily weights; Diuresed -2.9 L -unclear if weights during last admission were accurate -dry weight 152-154 -weight at home on 2/13= 157 -Strict I's and O's and Daily weights -Reassess Volume Status as an outpatient and have low salt diet -Follow up with Cardiology Dr. Haroldine [name] as an out patient and call to schedule appointment  Leukocytosis, improving -Improving; WBC went from 19.5 -> 16.5 -> 14.7 -hemodynamically stable -Discussed with Dr. Benay [name] of Oncology and patient always has some WBC given Pyruvate Kinase Deficiency -Afebrile and has been for 2 days  Hypokalemia, improved -Potassium was 3.3 and improved to 4.1 -Replete -Repeat CMP in AM  Chronic respiratory failure with hypoxia -stable on 2 L -continue Breo Ellipta and Fluticasone -C/w Home Meds  PAF--s/p RFA and Maze procedure Sept 2011 -Continue Diltazem 240 mg po, Bisoprolol 5 mg po BID and anticoagulation with Elquis.  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 Benay [name]; Discussed with Dr. Benay [name] and recommended not transfusing and no indication for steroids;  -Repeat CBC this AM showed Hb/Hct of 7.3/23.6 -C/w Deferasirox 1500 mg po daily for 2/2 Iron overload -Continue to Monitor Closely -Follow up with Dr. Benay [name] as an outpatient  Essential Hypertension -Stable. Resume home medications  Obstructive sleep apnea -Continue nighttime CPAP.  Hyperbilirubinemia -Bilirubin was 3.6 this AM -Review of Prievous Records show Bilirubin is almost elevated -Likely 2/2 to Pyruvate Kinase Deficiency  -Repeat CMP as an outpatient   Discharge Instructions  Discharge Instructions    Call MD for:  difficulty breathing, headache or visual disturbances    Complete by:  As directed    Call MD for:  persistant dizziness or light-headedness    Complete by:  As directed    Call MD for:  persistant nausea and vomiting    Complete by:  As directed    Call MD for:  severe uncontrolled pain    Complete by:  As directed    Call MD for:  temperature >100.4    Complete by:  As directed    Diet - low sodium heart healthy    Complete by:  As directed    Discharge instructions    Complete by:  As directed    Follow up with PCP, Hematology/Oncology, Cardiology at Discharge. Take all medications as prescribed. If symptoms worsen or change please return to the ED for evaluation.   Increase activity slowly    Complete by:  As directed      Allergies as of 07/13/2016      Reactions   Iodine Anaphylaxis  Shellfish Allergy Anaphylaxis   Iohexol     Code: HIVES, Desc: reaction since childhood, Onset Date: [id number]   Levaquin [levofloxacin In D5w] Nausea And Vomiting   dizziness      Medication List    STOP taking these medications   cefdinir 300 MG capsule Commonly known as:  OMNICEF     TAKE these medications   AIRBORNE Tbef Take 1 tablet by mouth 2 (two) times daily. *Dissolves in water twice a day*    albuterol 108 (90 Base) MCG/ACT inhaler Commonly known as:  PROVENTIL HFA;VENTOLIN HFA Inhale 2 puffs into the lungs every 6 (six) hours as needed for wheezing or shortness of breath.   ALIGN 4 MG Caps Take 1 tablet by mouth daily with breakfast.   apixaban 5 MG Tabs tablet Commonly known as:  ELIQUIS Take 1 tablet (5 mg total) by mouth 2 (two) times daily.   bisoprolol 5 MG tablet Commonly known as:  ZEBETA TAKE 1 TABLET(5 MG) BY MOUTH TWICE DAILY What changed:  how much to take  how to take this  when to take this  additional instructions   calcium-vitamin D 500-200 MG-UNIT tablet Commonly known as:  OSCAL WITH D Take 1 tablet by mouth 2 (two) times daily.   CHLORPHENIRAMINE MALEATE PO Take 4 mg by mouth at bedtime.   cholecalciferol 1000 units tablet Commonly known as:  VITAMIN D Take 1,000 Units by mouth 2 (two) times daily.   clonazePAM 1 MG tablet Commonly known as:  KLONOPIN Take 2 mg by mouth at bedtime.   deferasirox 500 MG disintegrating tablet Commonly known as:  EXJADE Take 3 tablets (1,500 mg total) by mouth daily before breakfast.   dextromethorphan 30 MG/5ML liquid Commonly known as:  DELSYM Take 60 mg by mouth at bedtime as needed for cough.   diclofenac sodium 1 % Gel Commonly known as:  VOLTAREN Apply 2 g topically 4 (four) times daily as needed. What changed:  reasons to take this   diltiazem 240 MG 24 hr capsule Commonly known as:  CARTIA XT Take 1 capsule (240 mg total) by mouth daily. What changed:  when to take this   EPINEPHrine 0.3 mg/0.3 mL Devi Commonly known as:  EPI-PEN Inject 0.3 mg into the muscle once as needed (for allergic reaction).   escitalopram 10 MG tablet Commonly known as:  LEXAPRO Take 10 mg by mouth at bedtime.   fexofenadine 180 MG tablet Commonly known as:  ALLEGRA Take 180 mg by mouth daily.   fluticasone 50 MCG/ACT nasal spray Commonly known as:  FLONASE Place 1 spray into both nostrils daily.    fluticasone furoate-vilanterol 100-25 MCG/INH Aepb Commonly known as:  BREO ELLIPTA INHALE 1 PUFF BY MOUTH EVERY DAY What changed:  how much to take  how to take this  when to take this  additional instructions   folic acid 1 MG tablet Commonly known as:  FOLVITE Take 1 mg by mouth daily with breakfast.   guaiFENesin-dextromethorphan 100-10 MG/5ML syrup Commonly known as:  ROBITUSSIN DM Take 5 mLs by mouth every 4 (four) hours as needed for cough.   OXYGEN Inhale 2 L into the lungs continuous.   pantoprazole 40 MG tablet Commonly known as:  PROTONIX Take 40 mg by mouth daily with breakfast.   potassium chloride SA 20 MEQ tablet Commonly known as:  K-DUR,KLOR-CON Take 2 tablets (40 mEq total) by mouth daily. What changed:  when to take this   raloxifene 60 MG tablet Commonly  known as:  EVISTA Take 60 mg by mouth daily with breakfast.   senna-docusate 8.6-50 MG tablet Commonly known as:  Senokot-S Take 1 tablet by mouth at bedtime as needed for mild constipation.   sodium chloride 0.65 % Soln nasal spray Commonly known as:  OCEAN Place 1 spray into both nostrils 2 (two) times daily.   spironolactone 25 MG tablet Commonly known as:  ALDACTONE TAKE 1/2 TABLET(12.5 MG) BY MOUTH DAILY   torsemide 20 MG tablet Commonly known as:  DEMADEX Take 2 tablets (40 mg total) by mouth daily.   traMADol 50 MG tablet Commonly known as:  ULTRAM Take 50 mg by mouth 3 (three) times daily.      Follow-up Information    GATES,ROBERT NEVILL, MD. Call in 1 week(s).   Specialty:  Internal Medicine Why:  Call to schedule appointment Contact information: [address] [place] [address] [phone]        [name], MD. Call in 1 week(s).   Specialty:  Oncology Why:  Call to schedule appointment in 1 week. Contact information: [place] [address] [phone]        Glori Bickers, MD. Call in 1 week(s).    Specialty:  Cardiology Why:  Call to schedule appointment  Contact information: [place] [address] [phone]          Allergies  Allergen Reactions  . Iodine Anaphylaxis  . Shellfish Allergy Anaphylaxis  . Iohexol      Code: HIVES, Desc: reaction since childhood, Onset Date: [id number]   . Levaquin [Levofloxacin In D5w] Nausea And Vomiting    dizziness   Consultations:  Hematology/Oncology Dr. Julieanne [name]  Procedures/Studies: Dg Chest 2 View  Result Date: 07/11/2016 CLINICAL DATA:  Cough, fever for 3 days EXAM: CHEST  2 VIEW COMPARISON:  04/26/2016 FINDINGS: Cardiomegaly. Prior valve replacement. Mild vascular congestion. Bibasilar atelectasis. No overt edema. No effusions or acute bony abnormality. IMPRESSION: Cardiomegaly with vascular congestion and bibasilar atelectasis. Electronically Signed   By: Rolm Baptise M.D.   On: 07/11/2016 08:14   Subjective: Seen and examined at bedside and was doing well and felt better. Stated she had a slight headache today. No nausea, vomiting, SOB, Urinary Discomfort or pain. No CP or any other complaints. Ready to go home.  Discharge Exam: Vitals:   07/12/16 2045 07/13/16 0505  BP:  107/62  Pulse:  70  Resp: 18 20  Temp:  99.1 F (37.3 C)   Vitals:   07/12/16 2045 07/13/16 0505 07/13/16 0656 07/13/16 0933  BP:  107/62    Pulse:  70    Resp: 18 20    Temp:  99.1 F (37.3 C)    TempSrc:  Oral    SpO2: 94% (!) 88% 92% 94%  Weight:  75.5 kg (166 lb 8 oz)    Height:       General: Pt is alert, awake, not in acute distress Cardiovascular: RRR, S1/S2 +, no rubs, no gallops Respiratory: CTA bilaterally, no wheezing, no rhonchi; Patient not tachypenic or using any accessory muscles to breathe on 2 Liters of O2.  Abdominal: Soft, NT, ND, bowel sounds + Extremities: Very mild edema, no cyanosis  The results of significant diagnostics from this hospitalization (including imaging,  microbiology, ancillary and laboratory) are listed below for reference.    Microbiology: Recent Results (from the past 240 hour(s))  Culture, blood (single) w Reflex to ID Panel  Status: None (Preliminary result)   Collection Time: 07/10/16  5:14 PM  Result Value Ref Range Status   Preliminary Report   Preliminary    Blood Culture received; No Growth to date; Culture will be held for 5 days before issuing a Final Negative report.     Comment: Culture results may be compromised due to an excessive volume of blood received in culture bottles.   Respiratory Panel by PCR     Status: None   Collection Time: 07/11/16 12:09 PM  Result Value Ref Range Status   Adenovirus NOT DETECTED NOT DETECTED Final   Coronavirus 229E NOT DETECTED NOT DETECTED Final   Coronavirus HKU1 NOT DETECTED NOT DETECTED Final   Coronavirus NL63 NOT DETECTED NOT DETECTED Final   Coronavirus OC43 NOT DETECTED NOT DETECTED Final   Metapneumovirus NOT DETECTED NOT DETECTED Final   Rhinovirus / Enterovirus NOT DETECTED NOT DETECTED Final   Influenza A NOT DETECTED NOT DETECTED Final   Influenza B NOT DETECTED NOT DETECTED Final   Parainfluenza Virus 1 NOT DETECTED NOT DETECTED Final   Parainfluenza Virus 2 NOT DETECTED NOT DETECTED Final   Parainfluenza Virus 3 NOT DETECTED NOT DETECTED Final   Parainfluenza Virus 4 NOT DETECTED NOT DETECTED Final   Respiratory Syncytial Virus NOT DETECTED NOT DETECTED Final   Bordetella pertussis NOT DETECTED NOT DETECTED Final   Chlamydophila pneumoniae NOT DETECTED NOT DETECTED Final   Mycoplasma pneumoniae NOT DETECTED NOT DETECTED Final    Comment: Performed at [facility], [place] [address], [place], [place] [address]  Urine culture     Status: Abnormal   Collection Time: 07/11/16  1:12 PM  Result Value Ref Range Status   Specimen Description URINE, RANDOM  Final   Special Requests NONE  Final   Culture MULTIPLE SPECIES PRESENT, SUGGEST RECOLLECTION (A)  Final    Report Status 07/13/2016 FINAL  Final  Culture, blood (Routine X 2) w Reflex to ID Panel     Status: None (Preliminary result)   Collection Time: 07/11/16  1:40 PM  Result Value Ref Range Status   Specimen Description BLOOD RIGHT ARM  Final   Special Requests IN PEDIATRIC BOTTLE 2CC  Final   Culture   Final    NO GROWTH 2 DAYS Performed at [facility], [place] [address] [place], [place] [address]    Report Status PENDING  Incomplete  Culture, blood (Routine X 2) w Reflex to ID Panel     Status: None (Preliminary result)   Collection Time: 07/11/16  1:49 PM  Result Value Ref Range Status   Specimen Description BLOOD LEFT ARM  Final   Special Requests IN PEDIATRIC BOTTLE 2CC  Final   Culture   Final    NO GROWTH 2 DAYS Performed at [facility], [place] [address] [place], [place] [address]    Report Status PENDING  Incomplete    Labs: BNP (last 3 results)  Recent Labs  12/30/15 1556 04/02/16 1447  BNP 443.0* 413.2*   Basic Metabolic Panel:  Recent Labs Lab 07/10/16 1714 07/11/16 1349 07/12/16 0814 07/13/16 0510  NA 137 136 136 135  K 4.0 3.7 3.3* 4.1  CL 101 104 101 100*  CO2 32 _0 GLUCOSE 105* 126* 95 102*  BUN _1 21*  CREATININE 0.86 0.89 0.82 0.95  CALCIUM 9.0 8.7* 8.6* 8.6*  MG  --  2.1  --  1.8  PHOS  --   --   --  3.5   Liver Function Tests:  Recent Labs Lab 07/10/16 1714 07/11/16 1349 07/12/16 0814 07/13/16 0510  AST _0 ALT _1 13*  ALKPHOS 80 81 64 67  BILITOT 3.0* 2.7* 2.7* 3.6*  PROT 6.6 6.7 6.4* 6.7  ALBUMIN 3.7 3.7 3.3* 3.4*   No results for input(s): LIPASE, AMYLASE in the last 168 hours. No results for input(s): AMMONIA in the last 168 hours. CBC:  Recent Labs Lab 07/10/16 1714 07/11/16 1349 07/12/16 0814 07/13/16 0510  WBC 19.5 Repeated and verified X2.* 17.0* 16.5* 14.7*  NEUTROABS 13.0* 11.1* 9.5* 8.6*  HGB 8.1* 7.9* 7.4* 7.3*  HCT 25.8* 25.2* 23.3* 23.6*  MCV 116.2* 117.2* 116.5* 116.3*   PLT 446.0* 464* 417* 401*   Cardiac Enzymes: No results for input(s): CKTOTAL, CKMB, CKMBINDEX, TROPONINI in the last 168 hours. BNP: Invalid input(s): POCBNP CBG: No results for input(s): GLUCAP in the last 168 hours. D-Dimer No results for input(s): DDIMER in the last 72 hours. Hgb A1c No results for input(s): HGBA1C in the last 72 hours. Lipid Profile No results for input(s): CHOL, HDL, LDLCALC, TRIG, CHOLHDL, LDLDIRECT in the last 72 hours. Thyroid function studies No results for input(s): TSH, T4TOTAL, T3FREE, THYROIDAB in the last 72 hours.  Invalid input(s): FREET3 Anemia work up No results for input(s): VITAMINB12, FOLATE, FERRITIN, TIBC, IRON, RETICCTPCT in the last 72 hours. Urinalysis    Component Value Date/Time   COLORURINE AMBER (A) 07/11/2016 1312   APPEARANCEUR CLEAR 07/11/2016 1312   LABSPEC 1.017 07/11/2016 1312   LABSPEC 1.015 04/27/2010 1236   PHURINE 6.0 07/11/2016 1312   GLUCOSEU NEGATIVE 07/11/2016 1312   GLUCOSEU NEGATIVE 07/10/2016 1714   HGBUR NEGATIVE 07/11/2016 1312   BILIRUBINUR NEGATIVE 07/11/2016 1312   BILIRUBINUR Negative 04/27/2010 1236   KETONESUR NEGATIVE 07/11/2016 1312   PROTEINUR NEGATIVE 07/11/2016 1312   UROBILINOGEN >=8.0 (A) 07/10/2016 1714   NITRITE NEGATIVE 07/11/2016 1312   LEUKOCYTESUR SMALL (A) 07/11/2016 1312   LEUKOCYTESUR Negative 04/27/2010 1236   Sepsis Labs Invalid input(s): PROCALCITONIN,  WBC,  LACTICIDVEN Microbiology Recent Results (from the past 240 hour(s))  Culture, blood (single) w Reflex to ID Panel     Status: None (Preliminary result)   Collection Time: 07/10/16  5:14 PM  Result Value Ref Range Status   Preliminary Report   Preliminary    Blood Culture received; No Growth to date; Culture will be held for 5 days before issuing a Final Negative report.     Comment: Culture results may be compromised due to an excessive volume of blood received in culture bottles.   Respiratory Panel by PCR      Status: None   Collection Time: 07/11/16 12:09 PM  Result Value Ref Range Status   Adenovirus NOT DETECTED NOT DETECTED Final   Coronavirus 229E NOT DETECTED NOT DETECTED Final   Coronavirus HKU1 NOT DETECTED NOT DETECTED Final   Coronavirus NL63 NOT DETECTED NOT DETECTED Final   Coronavirus OC43 NOT DETECTED NOT DETECTED Final   Metapneumovirus NOT DETECTED NOT DETECTED Final   Rhinovirus / Enterovirus NOT DETECTED NOT DETECTED Final   Influenza A NOT DETECTED NOT DETECTED Final   Influenza B NOT DETECTED NOT DETECTED Final   Parainfluenza Virus 1 NOT DETECTED NOT DETECTED Final   Parainfluenza Virus 2 NOT DETECTED NOT DETECTED Final   Parainfluenza Virus 3 NOT DETECTED NOT DETECTED Final   Parainfluenza Virus 4 NOT DETECTED NOT DETECTED Final   Respiratory Syncytial Virus NOT  DETECTED NOT DETECTED Final   Bordetella pertussis NOT DETECTED NOT DETECTED Final   Chlamydophila pneumoniae NOT DETECTED NOT DETECTED Final   Mycoplasma pneumoniae NOT DETECTED NOT DETECTED Final    Comment: Performed at [facility], [place] [address], [place], [place] [address]  Urine culture     Status: Abnormal   Collection Time: 07/11/16  1:12 PM  Result Value Ref Range Status   Specimen Description URINE, RANDOM  Final   Special Requests NONE  Final   Culture MULTIPLE SPECIES PRESENT, SUGGEST RECOLLECTION (A)  Final   Report Status 07/13/2016 FINAL  Final  Culture, blood (Routine X 2) w Reflex to ID Panel     Status: None (Preliminary result)   Collection Time: 07/11/16  1:40 PM  Result Value Ref Range Status   Specimen Description BLOOD RIGHT ARM  Final   Special Requests IN PEDIATRIC BOTTLE 2CC  Final   Culture   Final    NO GROWTH 2 DAYS Performed at [facility], [place] [address] [place], [place] [address]    Report Status PENDING  Incomplete  Culture, blood (Routine X 2) w Reflex to ID Panel     Status: None (Preliminary result)   Collection Time: 07/11/16  1:49 PM  Result Value Ref  Range Status   Specimen Description BLOOD LEFT ARM  Final   Special Requests IN PEDIATRIC BOTTLE 2CC  Final   Culture   Final    NO GROWTH 2 DAYS Performed at [facility], [place] [address], [place], [place] [address]    Report Status PENDING  Incomplete   Time coordinating discharge: Over 30 minutes  SIGNED:  Kerney Elbe, DO [facility] 07/13/2016, 1:43 PM Pager [phone]  If 7PM-7AM, please contact night-coverage [email or web address] Password [email or web address]

## 2016-07-16 ENCOUNTER — Telehealth: Payer: Self-pay | Admitting: *Deleted

## 2016-07-16 LAB — CULTURE, BLOOD (ROUTINE X 2)
CULTURE: NO GROWTH
Culture: NO GROWTH

## 2016-07-16 NOTE — Telephone Encounter (Signed)
Pt called scheduling to ask if she needs to see Dr. Benay Spice sooner than 3/19? Was told to F/U with MD after discharge from hospital. Reviewed with Dr. Benay Spice: Follow up as scheduled. Pt made aware. She reports elevated temperatures at home (up to 101). Reports temp 99 today. Instructed her to contact PCP if she continues to have fevers. She agreed to do so.

## 2016-07-17 LAB — CULTURE, BLOOD (SINGLE): ORGANISM ID, BACTERIA: NO GROWTH

## 2016-07-19 DIAGNOSIS — J189 Pneumonia, unspecified organism: Secondary | ICD-10-CM | POA: Diagnosis not present

## 2016-07-23 ENCOUNTER — Ambulatory Visit (INDEPENDENT_AMBULATORY_CARE_PROVIDER_SITE_OTHER)
Admission: RE | Admit: 2016-07-23 | Discharge: 2016-07-23 | Disposition: A | Discharge status: Home / Self Care | Payer: Medicare Other | Source: Ambulatory Visit | Attending: Adult Health | Admitting: Adult Health

## 2016-07-23 ENCOUNTER — Ambulatory Visit (INDEPENDENT_AMBULATORY_CARE_PROVIDER_SITE_OTHER): Payer: Medicare Other | Admitting: Adult Health

## 2016-07-23 ENCOUNTER — Encounter: Payer: Self-pay | Admitting: Adult Health

## 2016-07-23 VITALS — BP 106/74 | HR 83 | Ht 62.0 in | Wt 161.0 lb

## 2016-07-23 DIAGNOSIS — J189 Pneumonia, unspecified organism: Secondary | ICD-10-CM

## 2016-07-23 DIAGNOSIS — J45909 Unspecified asthma, uncomplicated: Secondary | ICD-10-CM

## 2016-07-23 DIAGNOSIS — R05 Cough: Secondary | ICD-10-CM | POA: Diagnosis not present

## 2016-07-23 MED ORDER — CEFUROXIME AXETIL 500 MG PO TABS: 500.0000 mg | ORAL_TABLET | Freq: Two times a day (BID) | ORAL | 0 refills | Status: AC

## 2016-07-23 MED ORDER — PREDNISONE 10 MG PO TABS: ORAL_TABLET | ORAL | 0 refills | Status: DC

## 2016-07-23 NOTE — Progress Notes (Signed)
@Patient  ID: [name], female    DOB: July 07, 1950, 66 y.o.   MRN: [id number]  Chief Complaint  Patient presents with  . 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  Hereditary pyruvate kinase deficiency, chronic anemia ([place]) Secondary iron overload maintained on Exjade History of mitral regurgitation, status post mitral valve repair by Dr. [name] in February 2010   07/23/2016 Follow up : Asthma  Pt returns for follow up for Asthma . She was recently admitted for fever ? Viral process. She underwent extensive  Workup that was essentially unrevealing .  CXR w. BB atx , (chronic anemia), viral panel , nml lactate and pct ., neg blood cultures.  After discharge she continued to have cough and run fever 100 to 103. She was seen by her PCP , started on Ceftin last week.  . She is starting to feel better . Fever went away since starting on abx.  Cough and congestion are starting to decreased but is not gone. She has intermittent wheezing . No leg swelling or chest pain.  CXR today shows atx vs infiltrate at right lung base.    Allergies  Allergen Reactions  . Iodine Anaphylaxis  . Shellfish Allergy Anaphylaxis  . Iohexol      Code: HIVES, Desc: reaction since childhood, Onset Date: [id number]   . Levaquin [Levofloxacin In D5w] Nausea And Vomiting    dizziness    Immunization History  Administered Date(s) Administered  . Influenza Split 02/26/2012  . Influenza Whole 02/25/2009  . Influenza,inj,Quad PF,36+ Mos 04/27/2013, 02/28/2015  . Influenza-Unspecified 03/03/2014  . Pneumococcal Conjugate-13 03/26/2014  . Pneumococcal Polysaccharide-23 05/28/2006, 04/08/2012    Past Medical History:  Diagnosis Date  . Allergic rhinitis   . Anemia   . Asthma   . Asthma   . Atrial fibrillation  ([place])    s/p RFA and Maze procedure - resolved  . Atrial tachycardia ([place])   . Complication of anesthesia    trouble waking up  . Depression   . Dyspnea   . Exercise hypoxemia    supplemental O2 PRN  . Fatigue   . Gastritis    NSAID induced  . GERD (gastroesophageal reflux disease)   . Heart murmur    severe MR s/p MV repair  . Hx of asplenia    surgical  . Hypertension   . 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 Myoclonus   . Osteoporosis   . Pulmonary HTN    mild to moderate by [place] 05/2012  . 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 Syncope and collapse 2012   r/t anemia  . Trochanteric bursitis    left hip    Tobacco History: History  Smoking Status  . Never Smoker  Smokeless Tobacco  . Never Used   Counseling given: Not Answered   Outpatient Encounter Prescriptions as of 07/23/2016  Medication Sig  . albuterol (PROVENTIL HFA;VENTOLIN HFA) 108 (90 Base) MCG/ACT inhaler Inhale 2 puffs into the lungs every 6 (six) hours as needed for wheezing or shortness of breath.  Marland Kitchen apixaban (ELIQUIS) 5 MG TABS tablet Take 1 tablet (5 mg total) by mouth 2 (two) times daily.  . bisoprolol (ZEBETA) 5 MG tablet TAKE 1 TABLET(5 MG) BY MOUTH TWICE DAILY (Patient taking  differently: Take 5 mg by mouth 2 (two) times daily. )  . calcium-vitamin D (OSCAL WITH D) 500-200 MG-UNIT tablet Take 1 tablet by mouth 2 (two) times daily.  . CHLORPHENIRAMINE MALEATE PO Take 4 mg by mouth at bedtime.  . cholecalciferol (VITAMIN D) 1000 UNITS tablet Take 1,000 Units by mouth 2 (two) times daily.   . clonazePAM (KLONOPIN) 1 MG tablet Take 2 mg by mouth at bedtime.   Marland Kitchen deferasirox (EXJADE) 500 MG disintegrating tablet Take 3 tablets (1,500 mg total) by mouth daily before breakfast.  . dextromethorphan (DELSYM) 30 MG/5ML liquid Take 60 mg by mouth at bedtime as needed for cough.  . diclofenac sodium (VOLTAREN) 1 % GEL Apply 2 g topically 4 (four) times  daily as needed. (Patient taking differently: Apply 2 g topically 4 (four) times daily as needed (for pain). )  . diltiazem (CARTIA XT) 240 MG 24 hr capsule Take 1 capsule (240 mg total) by mouth daily with breakfast.  . EPINEPHrine (EPI-PEN) 0.3 mg/0.3 mL DEVI Inject 0.3 mg into the muscle once as needed (for allergic reaction).   Marland Kitchen escitalopram (LEXAPRO) 10 MG tablet Take 10 mg by mouth at bedtime.   . fexofenadine (ALLEGRA) 180 MG tablet Take 180 mg by mouth daily.   . fluticasone (FLONASE) 50 MCG/ACT nasal spray Place 1 spray into both nostrils daily.  . fluticasone furoate-vilanterol (BREO ELLIPTA) 100-25 MCG/INH AEPB INHALE 1 PUFF BY MOUTH EVERY DAY (Patient taking differently: Inhale 1 puff into the lungs daily. )  . folic acid (FOLVITE) 1 MG tablet Take 1 mg by mouth daily with breakfast.   . guaiFENesin-dextromethorphan (ROBITUSSIN DM) 100-10 MG/5ML syrup Take 5 mLs by mouth every 4 (four) hours as needed for cough.  . Multiple Vitamins-Minerals (AIRBORNE) TBEF Take 1 tablet by mouth 2 (two) times daily. *Dissolves in water twice a day*  . OXYGEN Inhale 2 L into the lungs continuous.  . pantoprazole (PROTONIX) 40 MG tablet Take 40 mg by mouth daily with breakfast.   . potassium chloride SA (K-DUR,KLOR-CON) 20 MEQ tablet Take 2 tablets (40 mEq total) by mouth daily. (Patient taking differently: Take 40 mEq by mouth daily with breakfast. )  . Probiotic Product (ALIGN) 4 MG CAPS Take 1 tablet by mouth daily with breakfast.   . raloxifene (EVISTA) 60 MG tablet Take 60 mg by mouth daily with breakfast.   . senna-docusate (SENOKOT-S) 8.6-50 MG tablet Take 1 tablet by mouth at bedtime as needed for mild constipation.  . sodium chloride (OCEAN) 0.65 % SOLN nasal spray Place 1 spray into both nostrils 2 (two) times daily.   Marland Kitchen spironolactone (ALDACTONE) 25 MG tablet TAKE 1/2 TABLET(12.5 MG) BY MOUTH DAILY  . torsemide (DEMADEX) 20 MG tablet Take 2 tablets (40 mg total) by mouth daily.  . traMADol  (ULTRAM) 50 MG tablet Take 50 mg by mouth 3 (three) times daily.   . cefUROXime (CEFTIN) 500 MG tablet Take 1 tablet (500 mg total) by mouth 2 (two) times daily.  . predniSONE (DELTASONE) 10 MG tablet 4 tabs for 2 days, then 3 tabs for 2 days, 2 tabs for 2 days, then 1 tab for 2 days, then stop   No facility-administered encounter medications on file as of 07/23/2016.      Review of Systems  Constitutional:   No  weight loss, night sweats,  Fevers, chills,  +fatigue, or  lassitude.  HEENT:   No headaches,  Difficulty swallowing,  Tooth/dental problems, or  Sore throat,  No sneezing, itching, ear ache,  +nasal congestion, post nasal drip,   CV:  No chest pain,  Orthopnea, PND, swelling in lower extremities, anasarca, dizziness, palpitations, syncope.   GI  No heartburn, indigestion, abdominal pain, nausea, vomiting, diarrhea, change in bowel habits, loss of appetite, bloody stools.   Resp:  .  No chest wall deformity  Skin: no rash or lesions.  GU: no dysuria, change in color of urine, no urgency or frequency.  No flank pain, no hematuria   MS:  No joint pain or swelling.  No decreased range of motion.  No back pain.    Physical Exam  BP 106/74 (BP Location: Left Arm, Cuff Size: Normal)   Pulse 83   Ht 5\' 2"  (1.575 m)   Wt 161 lb (73 kg)   SpO2 94%   BMI 29.45 kg/m   GEN: A/Ox3; pleasant , NAD, well nourished    HEENT:  [place]/AT,  EACs-clear, TMs-wnl, NOSE-clear, THROAT-clear, no lesions, no postnasal drip or exudate noted.   NECK:  Supple w/ fair ROM; no JVD; normal carotid impulses w/o bruits; no thyromegaly or nodules palpated; no lymphadenopathy.    RESP  Bilateral exp wheezing , no accessory muscle use, no dullness to percussion  CARD:  RRR, no m/r/g, no peripheral edema, pulses intact, no cyanosis or clubbing.  GI:   Soft & nt; nml bowel sounds; no organomegaly or masses detected.   Musco: Warm bil, no deformities or joint swelling noted.   Neuro:  alert, no focal deficits noted.    Skin: Warm, no lesions or rashes    Lab Results:     Component Value Date/Time   PROBNP 936.3 (H) 09/21/2013 0909    Imaging: Dg Chest 2 View  Result Date: 07/23/2016 CLINICAL DATA:  Wheezing, cough, follow-up pneumonia. EXAM: CHEST  2 VIEW COMPARISON:  04/26/2016 and 07/10/2016 FINDINGS: Stable cardiomegaly with prior mitral valvular replacement. Aorta is slightly uncoiled in appearance without aneurysm. There is aortic atherosclerosis. Clearing of small right effusion since prior exam. There is atelectasis and/or minimal infiltrate at the right lung base. Mild diffuse interstitial prominence may reflect bronchitic change. IMPRESSION: Stable cardiomegaly with mild diffuse interstitial prominence which may reflect bronchitic change. More focal airspace opacity noted suspicious for a small focus of pneumonia versus atelectasis at the right lung base. Electronically Signed   By: Ashley Royalty M.D.   On: 07/23/2016 16:23   Dg Chest 2 View  Result Date: 07/11/2016 CLINICAL DATA:  Cough, fever for 3 days EXAM: CHEST  2 VIEW COMPARISON:  04/26/2016 FINDINGS: Cardiomegaly. Prior valve replacement. Mild vascular congestion. Bibasilar atelectasis. No overt edema. No effusions or acute bony abnormality. IMPRESSION: Cardiomegaly with vascular congestion and bibasilar atelectasis. Electronically Signed   By: Rolm Baptise M.D.   On: 07/11/2016 08:14     Assessment & Plan:   Asthma, intrinsic Flare with associated bronchitis/PNA-seems to be improving with abx , will extend out for total of 10 days.    Plan  Patient Instructions  Extend Ceftin for 3 additional days .  Prednisone taper over next week.  Add mucinex Twice daily  As needed  Cough/congestion  Delsym 2 tsp Twice daily  As needed  Cough .  Follow up with Dr. Lamonte Sakai  In 4-6 weeks  With chest xray and As needed   Please contact office for sooner follow up if symptoms do not improve or worsen or seek  emergency care      PNA (pneumonia) Bronchitis vs RLL  PNA - clinically improving , will extend abx for total of 10 d   Plan  Patient Instructions  Extend Ceftin for 3 additional days .  Prednisone taper over next week.  Add mucinex Twice daily  As needed  Cough/congestion  Delsym 2 tsp Twice daily  As needed  Cough .  Follow up with Dr. Lamonte Sakai  In 4-6 weeks  With chest xray and As needed   Please contact office for sooner follow up if symptoms do not improve or worsen or seek emergency care         Rexene Edison, NP 07/23/2016

## 2016-07-23 NOTE — Patient Instructions (Addendum)
Extend Ceftin for 3 additional days .  Prednisone taper over next week.  Add mucinex Twice daily  As needed  Cough/congestion  Delsym 2 tsp Twice daily  As needed  Cough .  Follow up with Dr. Lamonte Sakai  In 4-6 weeks  With chest xray and As needed   Please contact office for sooner follow up if symptoms do not improve or worsen or seek emergency care

## 2016-07-23 NOTE — Assessment & Plan Note (Signed)
Bronchitis vs RLL PNA - clinically improving , will extend abx for total of 10 d   Plan  Patient Instructions  Extend Ceftin for 3 additional days .  Prednisone taper over next week.  Add mucinex Twice daily  As needed  Cough/congestion  Delsym 2 tsp Twice daily  As needed  Cough .  Follow up with Dr. Lamonte Sakai  In 4-6 weeks  With chest xray and As needed   Please contact office for sooner follow up if symptoms do not improve or worsen or seek emergency care

## 2016-07-23 NOTE — Assessment & Plan Note (Signed)
Flare with associated bronchitis/PNA-seems to be improving with abx , will extend out for total of 10 days.    Plan  Patient Instructions  Extend Ceftin for 3 additional days .  Prednisone taper over next week.  Add mucinex Twice daily  As needed  Cough/congestion  Delsym 2 tsp Twice daily  As needed  Cough .  Follow up with Dr. Lamonte Sakai  In 4-6 weeks  With chest xray and As needed   Please contact office for sooner follow up if symptoms do not improve or worsen or seek emergency care

## 2016-07-26 ENCOUNTER — Ambulatory Visit (HOSPITAL_COMMUNITY)
Admission: RE | Admit: 2016-07-26 | Discharge: 2016-07-26 | Disposition: A | Discharge status: Home / Self Care | Payer: Medicare Other | Source: Ambulatory Visit | Attending: Internal Medicine | Admitting: Internal Medicine

## 2016-07-26 VITALS — BP 148/90 | HR 74 | Wt 165.2 lb

## 2016-07-26 DIAGNOSIS — I110 Hypertensive heart disease with heart failure: Secondary | ICD-10-CM | POA: Insufficient documentation

## 2016-07-26 DIAGNOSIS — I5032 Chronic diastolic (congestive) heart failure: Secondary | ICD-10-CM | POA: Diagnosis not present

## 2016-07-26 DIAGNOSIS — I272 Pulmonary hypertension, unspecified: Secondary | ICD-10-CM | POA: Diagnosis not present

## 2016-07-26 DIAGNOSIS — E669 Obesity, unspecified: Secondary | ICD-10-CM | POA: Insufficient documentation

## 2016-07-26 DIAGNOSIS — I341 Nonrheumatic mitral (valve) prolapse: Secondary | ICD-10-CM | POA: Diagnosis not present

## 2016-07-26 DIAGNOSIS — F329 Major depressive disorder, single episode, unspecified: Secondary | ICD-10-CM | POA: Diagnosis not present

## 2016-07-26 DIAGNOSIS — G589 Mononeuropathy, unspecified: Secondary | ICD-10-CM | POA: Diagnosis not present

## 2016-07-26 DIAGNOSIS — D649 Anemia, unspecified: Secondary | ICD-10-CM | POA: Insufficient documentation

## 2016-07-26 DIAGNOSIS — G4733 Obstructive sleep apnea (adult) (pediatric): Secondary | ICD-10-CM | POA: Insufficient documentation

## 2016-07-26 DIAGNOSIS — Z9889 Other specified postprocedural states: Secondary | ICD-10-CM | POA: Diagnosis not present

## 2016-07-26 DIAGNOSIS — I480 Paroxysmal atrial fibrillation: Secondary | ICD-10-CM | POA: Diagnosis not present

## 2016-07-26 DIAGNOSIS — K219 Gastro-esophageal reflux disease without esophagitis: Secondary | ICD-10-CM | POA: Diagnosis not present

## 2016-07-26 DIAGNOSIS — J9611 Chronic respiratory failure with hypoxia: Secondary | ICD-10-CM | POA: Diagnosis not present

## 2016-07-26 DIAGNOSIS — R0600 Dyspnea, unspecified: Secondary | ICD-10-CM | POA: Insufficient documentation

## 2016-07-26 DIAGNOSIS — I2721 Secondary pulmonary arterial hypertension: Secondary | ICD-10-CM | POA: Insufficient documentation

## 2016-07-26 DIAGNOSIS — J45909 Unspecified asthma, uncomplicated: Secondary | ICD-10-CM | POA: Diagnosis not present

## 2016-07-26 NOTE — Patient Instructions (Signed)
Your physician recommends that you schedule a follow-up appointment in: 3 months with Dr. Bensimhon  

## 2016-07-26 NOTE — Progress Notes (Signed)
Advanced Heart Failure Medication Review by a Pharmacist  Does the patient  feel that his/her medications are working for him/her?  yes  Has the patient been experiencing any side effects to the medications prescribed?  no  Does the patient measure his/her own blood pressure or blood glucose at home?  yes   Does the patient have any problems obtaining medications due to transportation or finances?   No - most meds $2/mo except Eliquis is $141/mo with [employer] Medicare - did discuss BMS patient assistance and will let us know when she meets 3% requirement  Understanding of regimen: good Understanding of indications: good Potential of compliance: good Patient understands to avoid NSAIDs. Patient understands to avoid decongestants.  Issues to address at subsequent visits: None   Pharmacist comments: [name] is a pleasant 66 yo F presenting with her husband and a current medication list. She reports good compliance with her regimen and is currently being treated for recurrent PNA. She did not have any other medication-related questions or concerns for me at this time.   Ruta Hinds. Velva Harman, PharmD, BCPS, CPP Clinical Pharmacist Pager: [phone] Phone: [phone] 07/26/2016 10:59 AM      Time with patient: 10 minutes Preparation and documentation time: 2 minutes Total time: 12 minutes

## 2016-07-26 NOTE — Progress Notes (Signed)
Advanced Heart Failure Clinic Note   PCP: Dr Inda Merlin  HF: Dr. Haroldine Laws  Pulmonary: Dr Lamonte Sakai   HPI: [name] is a 66 y/o woman with h/o HTN, pyruvate kinase deficiency with hemolytic anemia, hemochromatosis, obesity, asthma, OSA (on CPAP), mitral valve prolapse s/p mini MV repair/Maze (2011 with Dr. [name]), PAF s/p RFA followed by surgical Maze [id number] and diastolic dysfunction.  Pre-op cath with normal coronaries in 2011. 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   Baseline hgb 7-9. Doesn't get transfused unless 6 or below.  She presents today for PAH follow up. Last seen in clinic 11/2014. Just prior to that had been switched to torsemide.   Last right heart cath in 6/16. Filling pressures were elevate.   Today she returns for PAH/HF follow up. Over the winter she has struggled with viral illnesses/CAP. Currently being treated with antibiotics. Overall feeling poorly. Respiratory status slowly improving with muccinex/antibiotics.SOB with exertion. Denies PND/Orthopnea.  Continues on 2 liters oxygen continuously. Using CPAP nightly. Appetite improving. Weight at home 158 pounds. Taking all medications.   [place] 06/24/12 RA = 10  RV = 60/7/12  PA = 58/25 (40)  PCW = 19 (no significant v-waves)  Fick cardiac output/index = 6.6/3.7  PVR = 3.1 [place]  FA sat = 91%  PA sat = 66%, 67% PVR was not significantly elevated so selective pulmonary vasodilators were noted started.   RHC 6/16 RA = 17 PA = 74/31 (46) PCWP = 27 CI 3.89 PVR 2.8 WU  Echo 12/13 EF 60-65% mild LVH. Grade 2 DD. MVR stable (no MS noted). Moderate LAE. RV mildly dilated. Mod-severe TR with estimated RVSP 80-90 range (in 2011 was 40-45 mm HG) ECHO 10/22/12 EF 55% RV mildly decreased + Septal bounce. Moderate TR. RVSP 65-70. +diastolic dysfunction (likely restrictive - but tissue Doppler not performed).  ECHO 05/17/2014: EF 50-55%  TEE (4/15): EF 60-65%, severe bilateral atrial enlargement, s/p MV  ring with no MR and mild mitral stenosis with meean gradient 6, MVA 1.7 cm^2.  PASP on TTE in 4/15 was 60 mmHg.  Echo (6/16): EF 50-55%, moderate diastolic dysfunction, D-shaped interventricular septum, moderately dilated RV with mildly decreased systolic function, PA systolic pressure 60 mmHg, IVC not dilated, s/p MV repair with mild MS and trivial MR.   Labs (7/14): Potassium 4.6 BUN 16 Creatinine 0.68 hemoglobin 9, LDL 63 Labs (4/15): K 4.3, creatinine 0.9, HCT 26.5 Labs 09/21/13:  K 3.5  Labs 10/21/13 K 4.1 Creatinine 0.76 Labs 7/15: Hgb 9.1 Labs (6/16): K 3.9, creatinine 0.88 => 0.95, HCT 29.1, plts 512, BNP 811 Labs (11/24/15): K 3.4, creatinine 0.90   ROS: All systems negative except as listed in HPI, PMH and Problem List.  Past Medical History:  Diagnosis Date  . Allergic rhinitis   . Anemia   . Asthma   . Asthma   . Atrial fibrillation ([place])    s/p RFA and Maze procedure - resolved  . Atrial tachycardia ([place])   . Complication of anesthesia    trouble waking up  . Depression   . Dyspnea   . Exercise hypoxemia    supplemental O2 PRN  . Fatigue   . Gastritis    NSAID induced  . GERD (gastroesophageal reflux disease)   . Heart murmur    severe MR s/p MV repair  . Hx of asplenia    surgical  . Hypertension   . Mitral regurgitation    s/p MV repair  complicated by diaphragmatic paralysis  . Myoclonus   . Osteoporosis   . Pulmonary HTN    mild to moderate by [place] 05/2012  . Pyruvate kinase deficiency hemolytic anemia ([place])   . Syncope and collapse 2012   r/t anemia  . Trochanteric bursitis    left hip    Current Outpatient Prescriptions  Medication Sig Dispense Refill  . albuterol (PROVENTIL HFA;VENTOLIN HFA) 108 (90 Base) MCG/ACT inhaler Inhale 2 puffs into the lungs every 6 (six) hours as needed for wheezing or shortness of breath. 1 Inhaler 2  . apixaban (ELIQUIS) 5 MG TABS tablet Take 1 tablet (5 mg total) by mouth 2 (two) times daily. 60 tablet 6  . bisoprolol  (ZEBETA) 5 MG tablet TAKE 1 TABLET(5 MG) BY MOUTH TWICE DAILY (Patient taking differently: Take 5 mg by mouth 2 (two) times daily. ) 180 tablet 3  . calcium-vitamin D (OSCAL WITH D) 500-200 MG-UNIT tablet Take 1 tablet by mouth 2 (two) times daily.    . cefUROXime (CEFTIN) 500 MG tablet Take 1 tablet (500 mg total) by mouth 2 (two) times daily. 6 tablet 0  . CHLORPHENIRAMINE MALEATE PO Take 4 mg by mouth at bedtime.    . cholecalciferol (VITAMIN D) 1000 UNITS tablet Take 1,000 Units by mouth 2 (two) times daily.     . clonazePAM (KLONOPIN) 1 MG tablet Take 2 mg by mouth at bedtime.     Marland Kitchen deferasirox (EXJADE) 500 MG disintegrating tablet Take 3 tablets (1,500 mg total) by mouth daily before breakfast. 90 tablet 1  . dextromethorphan (DELSYM) 30 MG/5ML liquid Take 60 mg by mouth at bedtime as needed for cough.    . diclofenac sodium (VOLTAREN) 1 % GEL Apply 2 g topically 4 (four) times daily as needed. (Patient taking differently: Apply 2 g topically 4 (four) times daily as needed (for pain). ) 100 g 0  . diltiazem (CARTIA XT) 240 MG 24 hr capsule Take 1 capsule (240 mg total) by mouth daily with breakfast. 90 capsule 3  . escitalopram (LEXAPRO) 10 MG tablet Take 10 mg by mouth at bedtime.     . fexofenadine (ALLEGRA) 180 MG tablet Take 180 mg by mouth daily.     . fluticasone (FLONASE) 50 MCG/ACT nasal spray Place 1 spray into both nostrils daily.    . fluticasone furoate-vilanterol (BREO ELLIPTA) 100-25 MCG/INH AEPB INHALE 1 PUFF BY MOUTH EVERY DAY (Patient taking differently: Inhale 1 puff into the lungs daily. ) 60 each 5  . folic acid (FOLVITE) 1 MG tablet Take 1 mg by mouth daily with breakfast.     . guaiFENesin-dextromethorphan (ROBITUSSIN DM) 100-10 MG/5ML syrup Take 5 mLs by mouth every 4 (four) hours as needed for cough. 118 mL 0  . Multiple Vitamins-Minerals (AIRBORNE) TBEF Take 1 tablet by mouth 2 (two) times daily. *Dissolves in water twice a day*    . OXYGEN Inhale 2 L into the lungs  continuous.    . pantoprazole (PROTONIX) 40 MG tablet Take 40 mg by mouth daily with breakfast.     . potassium chloride SA (K-DUR,KLOR-CON) 20 MEQ tablet Take 2 tablets (40 mEq total) by mouth daily. (Patient taking differently: Take 40 mEq by mouth daily with breakfast. ) 60 tablet 3  . predniSONE (DELTASONE) 10 MG tablet 4 tabs for 2 days, then 3 tabs for 2 days, 2 tabs for 2 days, then 1 tab for 2 days, then stop 20 tablet 0  . Probiotic Product (ALIGN) 4 MG CAPS  Take 1 tablet by mouth daily with breakfast.     . raloxifene (EVISTA) 60 MG tablet Take 60 mg by mouth daily with breakfast.     . senna-docusate (SENOKOT-S) 8.6-50 MG tablet Take 1 tablet by mouth at bedtime as needed for mild constipation. 30 tablet 0  . sodium chloride (OCEAN) 0.65 % SOLN nasal spray Place 1 spray into both nostrils 2 (two) times daily.     Marland Kitchen spironolactone (ALDACTONE) 25 MG tablet TAKE 1/2 TABLET(12.5 MG) BY MOUTH DAILY 45 tablet 0  . torsemide (DEMADEX) 20 MG tablet Take 2 tablets (40 mg total) by mouth daily. 60 tablet 3  . traMADol (ULTRAM) 50 MG tablet Take 50 mg by mouth 3 (three) times daily.     Marland Kitchen EPINEPHrine (EPI-PEN) 0.3 mg/0.3 mL DEVI Inject 0.3 mg into the muscle once as needed (for allergic reaction).      No current facility-administered medications for this encounter.    Social History  Substance Use Topics  . Smoking status: Never Smoker  . Smokeless tobacco: Never Used  . Alcohol use Yes     Comment: socially glass red wine monthly   Family History  Problem Relation Age of Onset  . Breast cancer Mother 67    ER+  . Melanoma Father 59  . Adrenal disorder Sister     pheochromacytoma - rt. adrenal gland  . COPD Maternal Aunt   . Stomach cancer Paternal Uncle     onset in 39s  . Lung cancer Maternal Grandmother     died late 63s, heavy smoker  . Heart disease Maternal Grandfather     hardening of the arteries, poor circulation  . Ovarian cancer Sister 53    stage IV, now 58     Vitals:   07/26/16 1046  BP: (!) 148/90  Pulse: 74  SpO2: (!) 88%  Weight: 165 lb 3.2 oz (74.9 kg)     PHYSICAL EXAM: General: Ambulated in the clinic without difficulty. Wearing 2 liters oxygen. aring O2 HEENT: normal Neck: supple. JVP 5-6  Carotids 2+ bilaterally; no bruits. No lymphadenopathy or thryomegaly appreciated. Cor: PMI normal. Regular S1S2. No rubs, gallops.  1/6 HSM LLSB.  Loud P2 no RV lift  Lungs: clear 2 liters [place], Coarse throughout Abdomen: soft, NT, ND. No hepatosplenomegaly. No bruits or masses. Good bowel sounds. Extremities: no cyanosis, clubbing, rash, No lower extremity edema. Warm.  Neuro: alert & orientedx3, cranial nerves grossly intact. Moves all 4 extremities w/o difficulty. Affect pleasant.  ASSESSMENT & PLAN:  1. Atrial fibrillation: Paroxysmal. . Continue apixaban. No bleeding problems noted.  Regular pulse. Continue bisoprolol and diltiazem CD.  2. Pulmonary arterial hypertension: PVR was only 2.8 WU on 6/16 RHC. The PAH appears to be primarily Group 3 in the setting of OSA and chronic respiratory failure (on home oxygen, ?OHS).  Echo 6/16 showed that the RV is moderately dilated with mildly decreased systolic function. ECHO 03/2016 EF 55-60% Peak PA pressure 86 mm hg.   - Not thought to have a role for pulmonary vasodilators. - Continue OSA at night and oxygen during the day.   3. Chronic diastolic CHF: Volume status stable. Continue torsemide 80 mg daily  4. OHS/OSA: Stable on home oxygen and CPAP. Per Pulmonary.  5. 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  This has been stable.  Follows with Dr. Benay Spice.  6. S/p MV repair: Stable by echo with mild stenosis and no significant regurgitation.  7.  CAP- Currently taking  antibiotics. Followed by Dr Lamonte Sakai.   Follow up 3 months.    Darrick Grinder, NP-C  07/26/2016

## 2016-07-27 ENCOUNTER — Other Ambulatory Visit: Payer: Self-pay | Admitting: *Deleted

## 2016-07-27 MED ORDER — DEFERASIROX 500 MG PO TBSO: 1500.0000 mg | ORAL_TABLET | Freq: Every day | ORAL | 1 refills | Status: DC

## 2016-08-13 ENCOUNTER — Other Ambulatory Visit: Payer: Medicare Other

## 2016-08-13 ENCOUNTER — Encounter: Payer: Self-pay | Admitting: Pulmonary Disease

## 2016-08-13 ENCOUNTER — Ambulatory Visit (INDEPENDENT_AMBULATORY_CARE_PROVIDER_SITE_OTHER): Payer: Medicare Other | Admitting: Pulmonary Disease

## 2016-08-13 ENCOUNTER — Telehealth: Payer: Self-pay | Admitting: Oncology

## 2016-08-13 ENCOUNTER — Ambulatory Visit (INDEPENDENT_AMBULATORY_CARE_PROVIDER_SITE_OTHER)
Admission: RE | Admit: 2016-08-13 | Discharge: 2016-08-13 | Disposition: A | Discharge status: Home / Self Care | Payer: Medicare Other | Source: Ambulatory Visit | Attending: Pulmonary Disease | Admitting: Pulmonary Disease

## 2016-08-13 ENCOUNTER — Ambulatory Visit: Payer: Medicare Other | Admitting: Oncology

## 2016-08-13 VITALS — BP 108/60 | HR 57 | Ht 62.0 in | Wt 162.2 lb

## 2016-08-13 DIAGNOSIS — J45909 Unspecified asthma, uncomplicated: Secondary | ICD-10-CM

## 2016-08-13 DIAGNOSIS — G4733 Obstructive sleep apnea (adult) (pediatric): Secondary | ICD-10-CM

## 2016-08-13 DIAGNOSIS — R509 Fever, unspecified: Secondary | ICD-10-CM | POA: Diagnosis not present

## 2016-08-13 DIAGNOSIS — J9611 Chronic respiratory failure with hypoxia: Secondary | ICD-10-CM

## 2016-08-13 DIAGNOSIS — Z8701 Personal history of pneumonia (recurrent): Secondary | ICD-10-CM

## 2016-08-13 DIAGNOSIS — R05 Cough: Secondary | ICD-10-CM | POA: Diagnosis not present

## 2016-08-13 DIAGNOSIS — J0101 Acute recurrent maxillary sinusitis: Secondary | ICD-10-CM

## 2016-08-13 LAB — POCT INFLUENZA A/B
INFLUENZA A, POC: NEGATIVE
INFLUENZA B, POC: NEGATIVE

## 2016-08-13 MED ORDER — AMOXICILLIN-POT CLAVULANATE 875-125 MG PO TABS: 1.0000 | ORAL_TABLET | Freq: Two times a day (BID) | ORAL | 1 refills | Status: DC

## 2016-08-13 NOTE — Patient Instructions (Addendum)
Chest xray today  Augmentin 1 pill twice per day for 7 days.  If symptoms still persist, then take for an additional week  Increase oxygen to 3 liters at night with CPAP until symptoms better   Follow up with Dr. Lamonte Sakai on 09/17/16 as previously schedule

## 2016-08-13 NOTE — Progress Notes (Signed)
Current Outpatient Prescriptions on File Prior to Visit  Medication Sig  . albuterol (PROVENTIL HFA;VENTOLIN HFA) 108 (90 Base) MCG/ACT inhaler Inhale 2 puffs into the lungs every 6 (six) hours as needed for wheezing or shortness of breath.  Marland Kitchen apixaban (ELIQUIS) 5 MG TABS tablet Take 1 tablet (5 mg total) by mouth 2 (two) times daily.  . bisoprolol (ZEBETA) 5 MG tablet TAKE 1 TABLET(5 MG) BY MOUTH TWICE DAILY (Patient taking differently: Take 5 mg by mouth 2 (two) times daily. )  . calcium-vitamin D (OSCAL WITH D) 500-200 MG-UNIT tablet Take 1 tablet by mouth 2 (two) times daily.  . CHLORPHENIRAMINE MALEATE PO Take 4 mg by mouth at bedtime.  . cholecalciferol (VITAMIN D) 1000 UNITS tablet Take 1,000 Units by mouth 2 (two) times daily.   . clonazePAM (KLONOPIN) 1 MG tablet Take 2 mg by mouth at bedtime.   Marland Kitchen deferasirox (EXJADE) 500 MG disintegrating tablet Take 3 tablets (1,500 mg total) by mouth daily before breakfast.  . dextromethorphan (DELSYM) 30 MG/5ML liquid Take 60 mg by mouth at bedtime as needed for cough.  . diclofenac sodium (VOLTAREN) 1 % GEL Apply 2 g topically 4 (four) times daily as needed. (Patient taking differently: Apply 2 g topically 4 (four) times daily as needed (for pain). )  . diltiazem (CARTIA XT) 240 MG 24 hr capsule Take 1 capsule (240 mg total) by mouth daily with breakfast.  . EPINEPHrine (EPI-PEN) 0.3 mg/0.3 mL DEVI Inject 0.3 mg into the muscle once as needed (for allergic reaction).   Marland Kitchen escitalopram (LEXAPRO) 10 MG tablet Take 10 mg by mouth at bedtime.   . fexofenadine (ALLEGRA) 180 MG tablet Take 180 mg by mouth daily.   . fluticasone (FLONASE) 50 MCG/ACT nasal spray Place 1 spray into both nostrils daily.  . fluticasone furoate-vilanterol (BREO ELLIPTA) 100-25 MCG/INH AEPB INHALE 1 PUFF BY MOUTH EVERY DAY (Patient taking differently: Inhale 1 puff into the lungs daily. )  . folic acid (FOLVITE) 1 MG tablet Take 1 mg by mouth daily with breakfast.   . Multiple  Vitamins-Minerals (AIRBORNE) TBEF Take 1 tablet by mouth 2 (two) times daily. *Dissolves in water twice a day*  . OXYGEN Inhale 2 L into the lungs continuous.  . pantoprazole (PROTONIX) 40 MG tablet Take 40 mg by mouth daily with breakfast.   . potassium chloride SA (K-DUR,KLOR-CON) 20 MEQ tablet Take 2 tablets (40 mEq total) by mouth daily. (Patient taking differently: Take 40 mEq by mouth daily with breakfast. )  . Probiotic Product (ALIGN) 4 MG CAPS Take 1 tablet by mouth daily with breakfast.   . raloxifene (EVISTA) 60 MG tablet Take 60 mg by mouth daily with breakfast.   . senna-docusate (SENOKOT-S) 8.6-50 MG tablet Take 1 tablet by mouth at bedtime as needed for mild constipation.  . sodium chloride (OCEAN) 0.65 % SOLN nasal spray Place 1 spray into both nostrils 2 (two) times daily.   Marland Kitchen spironolactone (ALDACTONE) 25 MG tablet TAKE 1/2 TABLET(12.5 MG) BY MOUTH DAILY  . torsemide (DEMADEX) 20 MG tablet Take 2 tablets (40 mg total) by mouth daily.  . traMADol (ULTRAM) 50 MG tablet Take 50 mg by mouth 3 (three) times daily.    No current facility-administered medications on file prior to visit.      Chief Complaint  Patient presents with  . Follow-up    RA patient - C/o body aches, HA, sinus drainage, sinus pressure, wheezing, coughing and fever of 99-100.8 but only in the  evenings. Pt has a very congested and wet cough. Pt is concerned of recurrent PNA. Not aking anything OTC for symptoms.      Tests PFT 07/14/12 >> 1.26 (63%), FEV1% 76, TLC 3.08 (69%), DLCO 36% Echo 04/04/16 >> EF 55 to 60%, severe LA dilation, severe RA dilation, mod/severe TR, PAS 86 mmHg CT chest 04/02/16 >> focal consolidation LLL  Past medical history, Past surgical history, Family history, Social history, Allergies all reviewed.  Vital Signs BP 108/60 (BP Location: Left Arm, Cuff Size: Normal)   Pulse (!) 57   Ht 5\' 2"  (1.575 m)   Wt 162 lb 3.2 oz (73.6 kg)   SpO2 94%   BMI 29.67 kg/m   History of  Present Illness [name] is a 66 y.o. female with asthma, allergies, restrictive lung disease with hemidiaphragm paralysis, OSA, and pulmonary hypertension.  She is followed by Dr. Lamonte Sakai.    She is here for an acute visit.  She was treated recently for pneumonia.  CXR from 07/23/16 was concerning for pneumonia.  She had pneumonia 2 times prior to this over the past several months.  Since the past 3 days she has been feeling feverish with temp up to 100.8.  She has sinus congestion and post nasal drip.  She feels congested in her chest, but has trouble bringing up sputum.  She denies wheeze, chest pain, abdominal pain, nausea, diarrhea, skin rash, or swelling.  She uses oxygen 24/7.  Influenza swab today negative.  Physical Exam  General - speaks in full sentences ENT - mild maxillary sinus tenderness b/l, yellow drainage, mild erythema posterior pharynx, no LAN Cardiac - s1s2 regular, no murmur Chest - decreased BS, no wheeze Back - No focal tenderness Abd - Soft, non-tender Ext - No edema Neuro - Normal strength Skin - No rashes Psych - normal mood, and behavior   Assessment/Plan  Acute sinusitis with several recent episodes of pneumonia. - will give course of augmentin - continue nasal irrigation, allegra, flonase - chest xray today  Sleep disordered breathing with chronic respiratory failure. - continue CPAP at night - advised she could increase oxygen to 3 liters at night with CPAP until her symptoms improve   Patient Instructions  Chest xray today  Augmentin 1 pill twice per day for 7 days.  If symptoms still persist, then take for an additional week  Increase oxygen to 3 liters at night with CPAP until symptoms better   Follow up with Dr. Lamonte Sakai on 09/17/16 as previously schedule    [name], MD [facility] Pager:  [phone] 08/13/2016, 1:10 PM

## 2016-08-13 NOTE — Telephone Encounter (Signed)
Patient called to reschedule appointment. She stated she is not feeling well; running a fever.

## 2016-08-17 ENCOUNTER — Telehealth: Payer: Self-pay | Admitting: Pulmonary Disease

## 2016-08-17 NOTE — Telephone Encounter (Signed)
Dg Chest 2 View  Result Date: 08/13/2016 CLINICAL DATA:  66 year old female with fever and cough for 3 days. Sinus and nasal drainage for 1 week. Initial encounter. EXAM: CHEST  2 VIEW COMPARISON:  07/23/2016 and earlier. FINDINGS: Stable cardiomegaly and mediastinal contours. Prior cardiac valve replacement. Patchy opacity at the right lung base has not significantly changed since November. Trace fluid in the pleural fissures including the minor fissure. Pulmonary vascularity is stable. Visualized tracheal air column is within normal limits. No pneumothorax. No pleural effusion. No consolidation or areas of worsening ventilation. No acute osseous abnormality identified. Negative visible bowel gas pattern. IMPRESSION: 1. Patchy opacity at the right lung base has not significantly changed since November and is favored due to atelectasis or scarring. 2. Pulmonary vascular congestion with trace fluid in the fissures appears stable since February. No overt edema. 3. Stable cardiomegaly. Electronically Signed   By: Genevie Ann M.D.   On: 08/13/2016 14:33     Will have my nurse inform pt that CXR did not show evidence for pneumonia.

## 2016-08-28 NOTE — Telephone Encounter (Signed)
LM to CB for results

## 2016-08-30 ENCOUNTER — Telehealth: Payer: Self-pay | Admitting: *Deleted

## 2016-08-30 ENCOUNTER — Ambulatory Visit: Payer: Medicare Other | Admitting: Oncology

## 2016-08-30 ENCOUNTER — Other Ambulatory Visit: Payer: Medicare Other

## 2016-08-30 NOTE — Telephone Encounter (Signed)
"  I've been trying to call since you all opened but cannot reach scheduling.  I have appointments today that need to be rescheduled.  I've been up all night with diarrhea.  My husband started yesterday so I know I got it from him.  He went four times in an hour and couldn't make it to the bathroom.  If I'm contagious I do not want anybody to get this.  Ask the schedulers to call me at [phone]."  Scheduling message sent.  Encouraged to drink clear liquids and sports drinks/Gatorade, bland soft foods and call PCP for further evaluation.

## 2016-08-31 ENCOUNTER — Telehealth: Payer: Self-pay | Admitting: Oncology

## 2016-08-31 NOTE — Telephone Encounter (Signed)
Spoke with patient re lab/fu 4/30.

## 2016-09-05 DIAGNOSIS — Z1231 Encounter for screening mammogram for malignant neoplasm of breast: Secondary | ICD-10-CM | POA: Diagnosis not present

## 2016-09-05 DIAGNOSIS — Z6831 Body mass index (BMI) 31.0-31.9, adult: Secondary | ICD-10-CM | POA: Diagnosis not present

## 2016-09-05 DIAGNOSIS — Z124 Encounter for screening for malignant neoplasm of cervix: Secondary | ICD-10-CM | POA: Diagnosis not present

## 2016-09-07 ENCOUNTER — Telehealth (HOSPITAL_COMMUNITY): Payer: Self-pay | Admitting: *Deleted

## 2016-09-07 NOTE — Telephone Encounter (Signed)
Patient called triage line reporting that she has had a 3lb weight gain overnight but a 10lb weight gain in the last 2 weeks. She said that she has been having increased swelling in her legs/feet for 2-3 weeks. Patient requested for an appointment to be seen next week.  Discussed with Jettie Booze, NP and she advises patient to take an extra 40 mg of torsemide and 20 mEq of Potassium today and to have her come in next week.  Patient is agreeable with plan.  Appointment scheduled for next Tuesday.  Patient is aware.

## 2016-09-11 ENCOUNTER — Ambulatory Visit (HOSPITAL_COMMUNITY)
Admission: RE | Admit: 2016-09-11 | Discharge: 2016-09-11 | Disposition: A | Discharge status: Home / Self Care | Payer: Medicare Other | Source: Ambulatory Visit | Attending: Internal Medicine | Admitting: Internal Medicine

## 2016-09-11 ENCOUNTER — Encounter (HOSPITAL_COMMUNITY): Payer: Self-pay

## 2016-09-11 VITALS — BP 150/90 | HR 58 | Wt 169.8 lb

## 2016-09-11 DIAGNOSIS — Z9889 Other specified postprocedural states: Secondary | ICD-10-CM | POA: Insufficient documentation

## 2016-09-11 DIAGNOSIS — G253 Myoclonus: Secondary | ICD-10-CM | POA: Diagnosis not present

## 2016-09-11 DIAGNOSIS — I341 Nonrheumatic mitral (valve) prolapse: Secondary | ICD-10-CM | POA: Insufficient documentation

## 2016-09-11 DIAGNOSIS — I340 Nonrheumatic mitral (valve) insufficiency: Secondary | ICD-10-CM

## 2016-09-11 DIAGNOSIS — I5032 Chronic diastolic (congestive) heart failure: Secondary | ICD-10-CM

## 2016-09-11 DIAGNOSIS — K219 Gastro-esophageal reflux disease without esophagitis: Secondary | ICD-10-CM | POA: Insufficient documentation

## 2016-09-11 DIAGNOSIS — J45909 Unspecified asthma, uncomplicated: Secondary | ICD-10-CM | POA: Insufficient documentation

## 2016-09-11 DIAGNOSIS — F329 Major depressive disorder, single episode, unspecified: Secondary | ICD-10-CM | POA: Diagnosis not present

## 2016-09-11 DIAGNOSIS — I10 Essential (primary) hypertension: Secondary | ICD-10-CM

## 2016-09-11 DIAGNOSIS — D649 Anemia, unspecified: Secondary | ICD-10-CM | POA: Diagnosis not present

## 2016-09-11 DIAGNOSIS — E669 Obesity, unspecified: Secondary | ICD-10-CM | POA: Diagnosis not present

## 2016-09-11 DIAGNOSIS — I480 Paroxysmal atrial fibrillation: Secondary | ICD-10-CM | POA: Insufficient documentation

## 2016-09-11 DIAGNOSIS — I2721 Secondary pulmonary arterial hypertension: Secondary | ICD-10-CM | POA: Diagnosis not present

## 2016-09-11 DIAGNOSIS — I272 Pulmonary hypertension, unspecified: Secondary | ICD-10-CM | POA: Diagnosis not present

## 2016-09-11 DIAGNOSIS — G4733 Obstructive sleep apnea (adult) (pediatric): Secondary | ICD-10-CM | POA: Diagnosis not present

## 2016-09-11 DIAGNOSIS — I110 Hypertensive heart disease with heart failure: Secondary | ICD-10-CM | POA: Diagnosis not present

## 2016-09-11 LAB — BASIC METABOLIC PANEL
Anion gap: 8 (ref 5–15)
BUN: 27 mg/dL — ABNORMAL HIGH (ref 6–20)
CALCIUM: 8.8 mg/dL — AB (ref 8.9–10.3)
CHLORIDE: 96 mmol/L — AB (ref 101–111)
CO2: 31 mmol/L (ref 22–32)
CREATININE: 1.07 mg/dL — AB (ref 0.44–1.00)
GFR, EST NON AFRICAN AMERICAN: 52 mL/min — AB (ref >60)
Glucose, Bld: 141 mg/dL — ABNORMAL HIGH (ref 65–99)
Potassium: 3.6 mmol/L (ref 3.5–5.1)
SODIUM: 135 mmol/L (ref 135–145)

## 2016-09-11 LAB — BRAIN NATRIURETIC PEPTIDE: B NATRIURETIC PEPTIDE 5: 878 pg/mL — AB (ref 0.0–100.0)

## 2016-09-11 LAB — CBC
HCT: 27.2 % — ABNORMAL LOW (ref 36.0–46.0)
Hemoglobin: 8.3 g/dL — ABNORMAL LOW (ref 12.0–15.0)
MCH: 32.7 pg (ref 26.0–34.0)
MCHC: 30.5 g/dL (ref 30.0–36.0)
MCV: 107.1 fL — AB (ref 78.0–100.0)
PLATELETS: 537 10*3/uL — AB (ref 150–400)
RBC: 2.54 MIL/uL — AB (ref 3.87–5.11)
RDW: 15.6 % — AB (ref 11.5–15.5)
WBC: 9.6 10*3/uL (ref 4.0–10.5)

## 2016-09-11 MED ORDER — METOLAZONE 2.5 MG PO TABS: 2.5000 mg | ORAL_TABLET | ORAL | 0 refills | Status: DC | PRN

## 2016-09-11 MED ORDER — TORSEMIDE 20 MG PO TABS: 40.0000 mg | ORAL_TABLET | Freq: Two times a day (BID) | ORAL | 3 refills | Status: DC

## 2016-09-11 MED ORDER — POTASSIUM CHLORIDE CRYS ER 20 MEQ PO TBCR: 60.0000 meq | EXTENDED_RELEASE_TABLET | Freq: Every day | ORAL | 3 refills | Status: DC

## 2016-09-11 NOTE — Progress Notes (Signed)
Advanced Heart Failure Clinic Note   PCP: Dr [name]  HF: Dr. Haroldine [name]  Pulmonary: Dr [name]   HPI: [name] is a 66 y/o woman with h/o HTN, pyruvate kinase deficiency with hemolytic anemia, hemochromatosis, obesity, asthma, OSA (on CPAP), mitral valve prolapse s/p mini MV repair/Maze (2011 with Dr. Roxy [name]), PAF s/p RFA followed by surgical Maze 2956 and diastolic dysfunction.  Pre-op cath with normal coronaries in 2011. 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   Baseline hgb 7-9. Doesn't get transfused unless 6 or below.  She presents today for PAH follow up. Last seen in clinic 11/2014. Just prior to that had been switched to torsemide.   Last right heart cath in 6/16. Filling pressures were elevate.   She presents today for add on for SOB. Is chronically on 02 at 2 L. Over past 2-3 weeks 02 levels have been in 80s with activity. SOB walking from waiting room to scale. Not SOB changing clothes or bathing.  Weight at home 167 lbs, usually close to 160 lbs. Weight up about 4 lbs from her last visit. Belly more distended. Watching salt and fluid.  Using CPAP every nightly. Appetite diminished at time. Has been taking torsemide 40 mg daily, but has taken 80 mg daily for the last 4 days. Was treated for CAP last month. No other recent illness.   Spelter 06/24/12 RA = 10  RV = 60/7/12  PA = 58/25 (40)  PCW = 19 (no significant v-waves)  Fick cardiac output/index = 6.6/3.7  PVR = 3.1 [place]  FA sat = 91%  PA sat = 66%, 67% PVR was not significantly elevated so selective pulmonary vasodilators were noted started.   RHC 6/16 RA = 17 PA = 74/31 (46) PCWP = 27 CI 3.89 PVR 2.8 WU  Echo 12/13 EF 60-65% mild LVH. Grade 2 DD. MVR stable (no MS noted). Moderate LAE. RV mildly dilated. Mod-severe TR with estimated RVSP 80-90 range (in 2011 was 40-45 mm HG) ECHO 10/22/12 EF 55% RV mildly decreased + Septal bounce. Moderate TR. RVSP 65-70. +diastolic dysfunction (likely  restrictive - but tissue Doppler not performed).  ECHO 05/17/2014: EF 50-55%  TEE (4/15): EF 60-65%, severe bilateral atrial enlargement, s/p MV ring with no MR and mild mitral stenosis with meean gradient 6, MVA 1.7 cm^2.  PASP on TTE in 4/15 was 60 mmHg.  Echo (6/16): EF 50-55%, moderate diastolic dysfunction, D-shaped interventricular septum, moderately dilated RV with mildly decreased systolic function, PA systolic pressure 60 mmHg, IVC not dilated, s/p MV repair with mild MS and trivial MR.   Labs (7/14): Potassium 4.6 BUN 16 Creatinine 0.68 hemoglobin 9, LDL 63 Labs (4/15): K 4.3, creatinine 0.9, HCT 26.5 Labs 09/21/13:  K 3.5  Labs 10/21/13 K 4.1 Creatinine 0.76 Labs 7/15: Hgb 9.1 Labs (6/16): K 3.9, creatinine 0.88 => 0.95, HCT 29.1, plts 512, BNP 811 Labs (11/24/15): K 3.4, creatinine 0.90   ROS: All systems negative except as listed in HPI, PMH and Problem List.  Past Medical History:  Diagnosis Date  . Allergic rhinitis   . Anemia   . Asthma   . Asthma   . Atrial fibrillation ([place])    s/p RFA and Maze procedure - resolved  . Atrial tachycardia ([place])   . Complication of anesthesia    trouble waking up  . Depression   . Dyspnea   . Exercise hypoxemia    supplemental O2 PRN  . Fatigue   .  Gastritis    NSAID induced  . GERD (gastroesophageal reflux disease)   . Heart murmur    severe MR s/p MV repair  . Hx of asplenia    surgical  . Hypertension   . 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 Myoclonus   . Osteoporosis   . Pulmonary HTN ([place])    mild to moderate by [place] 05/2012  . Pyruvate kinase deficiency hemolytic anemia ([place])   . Syncope and collapse 2012   r/t anemia  . Trochanteric bursitis    left hip    Current Outpatient Prescriptions  Medication Sig Dispense Refill  . albuterol (PROVENTIL HFA;VENTOLIN HFA) 108 (90 Base) MCG/ACT inhaler Inhale 2 puffs into the lungs every 6 (six) hours as needed for wheezing or  shortness of breath. 1 Inhaler 2  . apixaban (ELIQUIS) 5 MG TABS tablet Take 1 tablet (5 mg total) by mouth 2 (two) times daily. 60 tablet 6  . bisoprolol (ZEBETA) 5 MG tablet TAKE 1 TABLET(5 MG) BY MOUTH TWICE DAILY (Patient taking differently: Take 5 mg by mouth 2 (two) times daily. ) 180 tablet 3  . calcium-vitamin D (OSCAL WITH D) 500-200 MG-UNIT tablet Take 1 tablet by mouth 2 (two) times daily.    . CHLORPHENIRAMINE MALEATE PO Take 4 mg by mouth at bedtime.    . cholecalciferol (VITAMIN D) 1000 UNITS tablet Take 1,000 Units by mouth 2 (two) times daily.     . clonazePAM (KLONOPIN) 1 MG tablet Take 2 mg by mouth at bedtime.     Marland Kitchen deferasirox (EXJADE) 500 MG disintegrating tablet Take 3 tablets (1,500 mg total) by mouth daily before breakfast. 90 tablet 1  . dextromethorphan (DELSYM) 30 MG/5ML liquid Take 60 mg by mouth at bedtime as needed for cough.    . diclofenac sodium (VOLTAREN) 1 % GEL Apply 2 g topically 4 (four) times daily as needed. (Patient taking differently: Apply 2 g topically 4 (four) times daily as needed (for pain). ) 100 g 0  . diltiazem (CARTIA XT) 240 MG 24 hr capsule Take 1 capsule (240 mg total) by mouth daily with breakfast. 90 capsule 3  . EPINEPHrine (EPI-PEN) 0.3 mg/0.3 mL DEVI Inject 0.3 mg into the muscle once as needed (for allergic reaction).     Marland Kitchen escitalopram (LEXAPRO) 10 MG tablet Take 10 mg by mouth at bedtime.     . fexofenadine (ALLEGRA) 180 MG tablet Take 180 mg by mouth daily.     . fluticasone (FLONASE) 50 MCG/ACT nasal spray Place 1 spray into both nostrils daily.    . fluticasone furoate-vilanterol (BREO ELLIPTA) 100-25 MCG/INH AEPB INHALE 1 PUFF BY MOUTH EVERY DAY (Patient taking differently: Inhale 1 puff into the lungs daily. ) 60 each 5  . folic acid (FOLVITE) 1 MG tablet Take 1 mg by mouth daily with breakfast.     . Multiple Vitamins-Minerals (AIRBORNE) TBEF Take 1 tablet by mouth 2 (two) times daily. *Dissolves in water twice a day*    . OXYGEN  Inhale 2 L into the lungs continuous.    . pantoprazole (PROTONIX) 40 MG tablet Take 40 mg by mouth daily with breakfast.     . potassium chloride SA (K-DUR,KLOR-CON) 20 MEQ tablet Take 2 tablets (40 mEq total) by mouth daily. (Patient taking differently: Take 40 mEq by mouth daily with breakfast. ) 60 tablet 3  . Probiotic Product (ALIGN) 4 MG CAPS Take 1 tablet by mouth daily with breakfast.     .  raloxifene (EVISTA) 60 MG tablet Take 60 mg by mouth daily with breakfast.     . senna-docusate (SENOKOT-S) 8.6-50 MG tablet Take 1 tablet by mouth at bedtime as needed for mild constipation. 30 tablet 0  . sodium chloride (OCEAN) 0.65 % SOLN nasal spray Place 1 spray into both nostrils 2 (two) times daily.     Marland Kitchen spironolactone (ALDACTONE) 25 MG tablet TAKE 1/2 TABLET(12.5 MG) BY MOUTH DAILY 45 tablet 0  . torsemide (DEMADEX) 20 MG tablet Take 2 tablets (40 mg total) by mouth daily. 60 tablet 3  . traMADol (ULTRAM) 50 MG tablet Take 50 mg by mouth 3 (three) times daily.     Marland Kitchen amoxicillin-clavulanate (AUGMENTIN) 875-125 MG tablet Take 1 tablet by mouth 2 (two) times daily. 14 tablet 1   No current facility-administered medications for this encounter.    Social History  Substance Use Topics  . Smoking status: Never Smoker  . Smokeless tobacco: Never Used  . Alcohol use Yes     Comment: socially glass red wine monthly   Family History  Problem Relation Age of Onset  . Breast cancer Mother 72    ER+  . Melanoma Father 30  . Adrenal disorder Sister     pheochromacytoma - rt. adrenal gland  . COPD Maternal Aunt   . Stomach cancer Paternal Uncle     onset in 80s  . Lung cancer Maternal Grandmother     died late 84s, heavy smoker  . Heart disease Maternal Grandfather     hardening of the arteries, poor circulation  . Ovarian cancer Sister 18    stage IV, now 40    Vitals:   09/11/16 1136  BP: (!) 150/90  Pulse: (!) 58  SpO2: (!) 82%  Weight: 169 lb 12.8 oz (77 kg)   Wt Readings from  Last 3 Encounters:  09/11/16 169 lb 12.8 oz (77 kg)  08/13/16 162 lb 3.2 oz (73.6 kg)  07/26/16 165 lb 3.2 oz (74.9 kg)     PHYSICAL EXAM: General: Ambulated in the clinic without difficulty. Wearing 2 liters oxygen. aring O2 HEENT: Normal Neck: Supple. JVP 9-10 cm. Carotids 2+ bilaterally; no bruits. No thyromegaly or nodule noted.   Cor: PMI non-displaced. RRR. 1/6 HSM LLSB. Loud P2 no RV lift.   Lungs: Diminished basilar sounds on 02 at 2L Abdomen: Soft, NT, ND, no HSM. No bruits or masses. +BS  Extremities: No cyanosis, clubbing, or rash. 1+ edema.   Neuro: Alert & oriented x 3. Cranial nerves grossly intact. Moves all 4 extremities w/o difficulty. Affect pleasant    ASSESSMENT & PLAN:  1. Atrial fibrillation: Paroxysmal.  - No bleeding on eliquis. - Continue bisoprolol 5 mg BID and diltiazem CD 240 mg daily - Regular by exam today.  2. Pulmonary arterial hypertension: PVR was only 2.8 WU on 6/16 RHC. The PAH appears to be primarily Group 3 in the setting of OSA and chronic respiratory failure (on home oxygen, ?OHS).  Echo 6/16 showed that the RV is moderately dilated with mildly decreased systolic function. ECHO 03/2016 EF 55-60% Peak PA pressure 86 mm hg.   - Not thought to have a role for pulmonary vasodilators. - Continue OSA at night and oxygen during the day.   - Has increased oxygen demand.  Hope this will get better with diuresis.  - She sees Dr. Lamonte [name] next week.  3. Chronic diastolic CHF: Volume status elevated on exam.  - Increase torsemide from 40 mg daily to  40 mg BID.  - Take metolazone 2.5 mg tomorrow. Take prn for weight gain or edema.  - Continue potassium 60 meq daily for now.  - .Reinforced fluid restriction to < 2 L daily, sodium restriction to less than 2000 mg daily, and the importance of daily weights.    4. OHS/OSA: Stable on home oxygen and CPAP. Per Pulmonary. No change.  5. 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   - Follows with  Dr. Benay Spice. Has been stable.   - CBC today.  6. S/p MV repair:  - Stable by most recent echo with mild stenosis and no significant regurgitation.    Labs and meds as above. RTC in 6 weeks with scheduled MD visit. Sooner with worsening or non improved symptoms.   Shirley Friar, PA-C  09/11/2016  Greater than 50% of the 25 minute visit was spent in counseling/coordination of care regarding disease state education, medication reconciliation, and fluid/sodium restriction.

## 2016-09-11 NOTE — Patient Instructions (Signed)
INCREASE Torsemide to 40 mg (2 tabs) twice a day INCREASE Potassium 40 MeQ (2 tabs) daily START Metolazone 2.5 mg, TAKE ONE TAB TOMORROW 09/12/16, THEN ONLY AS DIRECTED BY THE CHF CLINIC   Labs today We will only contact you if something comes back abnormal or we need to make some changes. Otherwise no news is good news!  Your physician recommends that you schedule a follow-up appointment as scheduled with Dr Haroldine Laws  Do the following things EVERYDAY: 1) Weigh yourself in the morning before breakfast. Write it down and keep it in a log. 2) Take your medicines as prescribed 3) Eat low salt foods-Limit salt (sodium) to 2000 mg per day.  4) Stay as active as you can everyday 5) Limit all fluids for the day to less than 2 liters

## 2016-09-14 NOTE — Telephone Encounter (Signed)
Results have been explained to patient, pt expressed understanding. Nothing further needed.  

## 2016-09-17 ENCOUNTER — Encounter: Payer: Self-pay | Admitting: Emergency Medicine

## 2016-09-17 ENCOUNTER — Ambulatory Visit (INDEPENDENT_AMBULATORY_CARE_PROVIDER_SITE_OTHER): Payer: Medicare Other | Admitting: Emergency Medicine

## 2016-09-17 DIAGNOSIS — G4733 Obstructive sleep apnea (adult) (pediatric): Secondary | ICD-10-CM

## 2016-09-17 DIAGNOSIS — J45909 Unspecified asthma, uncomplicated: Secondary | ICD-10-CM

## 2016-09-17 DIAGNOSIS — I272 Pulmonary hypertension, unspecified: Secondary | ICD-10-CM | POA: Diagnosis not present

## 2016-09-17 DIAGNOSIS — J9611 Chronic respiratory failure with hypoxia: Secondary | ICD-10-CM | POA: Diagnosis not present

## 2016-09-17 DIAGNOSIS — J301 Allergic rhinitis due to pollen: Secondary | ICD-10-CM | POA: Diagnosis not present

## 2016-09-17 NOTE — Assessment & Plan Note (Signed)
More hoarse voice currently - increase flonase to bid, continue allegra and chlorpheniramine. Discussed adding [place] qd

## 2016-09-17 NOTE — Progress Notes (Signed)
Subjective:    Patient ID: [name], female    DOB: 1950/11/11, 66 y.o.   MRN: [id number]  Asthma  She complains of shortness of breath. There is no cough or wheezing. Associated symptoms include postnasal drip. Pertinent negatives include no ear pain, fever, headaches, rhinorrhea, sneezing, sore throat or trouble swallowing. Her past medical history is significant for asthma.   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  She was treated here for an acute exacerbation of her asthma in October. Unfortunately she was subsequently admitted to the hospital for a presumed CAP. The presentation was actually for back pain. Additionally there may have been some component of CHF at that time as well. A repeat chest x-ray was done on 04/26/16 that showed clearing of her infiltrates. Her current bronchodilator regimen includes Breo. Has albuterol available but has not used it. She is breathing better. She has daily hoarseness. No real cough. She has been getting over a lot of congestion and nasal gtt. She is on chlorpheniramine qhs, flonase, allegra. She is on CPAP reliably.   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 Also with diastolic CHF, atrial fibrillation, history of mitral valve repair. Secondary pulmonary hypertension due to all of the above. He has had some increased edema, increased weight, diuresis increased by cardiology 09/11/16.  She notes that her dyspnea has improved with the increase in her diuresis. The  edema was mostly abdominal and in her neck. She still has exertional dyspnea and has seen exertional desats on 2L/min. She was treated for AE's in January and February, CAP and pleuritic pain. Not resolved. Currently no wheeze, cough. She does have hoarse voice. She is on allegra and flonase qd, chlorpheniramine qhs.    Review of Systems  Constitutional: Positive for unexpected weight change. Negative for fever.  HENT: Positive for congestion and postnasal drip. Negative for dental problem, ear pain, nosebleeds, rhinorrhea, sinus pressure, sneezing, sore throat and trouble swallowing.   Eyes: Negative for redness and itching.  Respiratory: Positive for shortness of breath. Negative for cough, chest tightness and wheezing.   Cardiovascular: Negative for palpitations and leg swelling.  Gastrointestinal: Negative for nausea and vomiting.  Genitourinary: Negative for dysuria.  Musculoskeletal: Negative for joint swelling.  Skin: Negative for rash.  Neurological: Negative for headaches.  Hematological: Does not bruise/bleed easily.  Psychiatric/Behavioral: Negative for dysphoric mood. The patient is not nervous/anxious.        Objective:   Physical Exam  Vitals:   09/17/16 1036  BP: 120/60  Pulse: 70  SpO2: (!) 88%  Weight: 160 lb 12.8 oz (72.9 kg)  Height: 5\' 2"  (1.575 m)  Gen: Pleasant, well-nourished, in no distress,  normal affect  ENT: No lesions,  mouth clear,  oropharynx clear, some postnasal drip, hoarse voice  Neck: No JVD, no TMG, no carotid bruits  Lungs: No use of accessory muscles, clear without rales or rhonchi  Cardiovascular: RRR, 2/6 M  Musculoskeletal: No deformities, no cyanosis or clubbing  Neuro: alert, non focal  Skin: Warm, no lesions or rashes  Assessment & Plan:  Obstructive sleep apnea Continue CPAP, good compliance/   Pulmonary HTN Multifactorial, has been decompensated in last month - diuretic increased. Continue to aggressively treat the  underlying conditions.   Chronic respiratory failure (HCC) Walking oximetry today to determine whether we need to increase her exertional O2  Asthma, intrinsic continue breo as ordered   Allergic rhinitis More hoarse voice currently - increase flonase to bid, continue allegra and chlorpheniramine. Discussed adding [place] qd  Baltazar Apo, MD, PhD 09/17/2016, 11:18 AM [facility] [phone] or if no answer [phone]

## 2016-09-17 NOTE — Assessment & Plan Note (Signed)
Multifactorial, has been decompensated in last month - diuretic increased. Continue to aggressively treat the underlying conditions.

## 2016-09-17 NOTE — Assessment & Plan Note (Signed)
Continue CPAP, good compliance/

## 2016-09-17 NOTE — Patient Instructions (Signed)
Continue your breo daily Continue your allegra and chlorpheniramine as you are taking them  Increase your flonase to 2 sprays each side twice a day during the allergy season. Walking oximetry today on 2l/min to determine whether you need to increase.  continue your diuretics as directed by Cardiology continue your CPAP every night.  Follow with Dr Lamonte Sakai in 2 months or sooner if you have any problems.

## 2016-09-17 NOTE — Assessment & Plan Note (Signed)
continue breo as ordered

## 2016-09-17 NOTE — Assessment & Plan Note (Signed)
Walking oximetry today to determine whether we need to increase her exertional O2

## 2016-09-24 ENCOUNTER — Other Ambulatory Visit (HOSPITAL_BASED_OUTPATIENT_CLINIC_OR_DEPARTMENT_OTHER): Payer: Medicare Other

## 2016-09-24 ENCOUNTER — Telehealth: Payer: Self-pay | Admitting: Oncology

## 2016-09-24 ENCOUNTER — Ambulatory Visit (HOSPITAL_BASED_OUTPATIENT_CLINIC_OR_DEPARTMENT_OTHER): Payer: Medicare Other | Admitting: Oncology

## 2016-09-24 VITALS — BP 128/46 | HR 67 | Temp 98.4°F | Resp 19 | Ht 62.0 in | Wt 160.1 lb

## 2016-09-24 DIAGNOSIS — D552 Anemia due to disorders of glycolytic enzymes: Secondary | ICD-10-CM

## 2016-09-24 DIAGNOSIS — D594 Other nonautoimmune hemolytic anemias: Secondary | ICD-10-CM

## 2016-09-24 LAB — CBC WITH DIFFERENTIAL/PLATELET
BASO%: 0.7 % (ref 0.0–2.0)
Basophils Absolute: 0.1 10*3/uL (ref 0.0–0.1)
EOS ABS: 0.5 10*3/uL (ref 0.0–0.5)
EOS%: 5.1 % (ref 0.0–7.0)
HEMATOCRIT: 27.6 % — AB (ref 34.8–46.6)
HGB: 8.5 g/dL — ABNORMAL LOW (ref 11.6–15.9)
LYMPH%: 24.8 % (ref 14.0–49.7)
MCH: 33.3 pg (ref 25.1–34.0)
MCHC: 30.8 g/dL — AB (ref 31.5–36.0)
MCV: 108.2 fL — ABNORMAL HIGH (ref 79.5–101.0)
MONO#: 2.1 10*3/uL — ABNORMAL HIGH (ref 0.1–0.9)
MONO%: 22.6 % — AB (ref 0.0–14.0)
NEUT#: 4.4 10*3/uL (ref 1.5–6.5)
NEUT%: 46.8 % (ref 38.4–76.8)
PLATELETS: 490 10*3/uL — AB (ref 145–400)
RBC: 2.55 10*6/uL — ABNORMAL LOW (ref 3.70–5.45)
RDW: 15.2 % — AB (ref 11.2–14.5)
WBC: 9.5 10*3/uL (ref 3.9–10.3)
lymph#: 2.4 10*3/uL (ref 0.9–3.3)

## 2016-09-24 LAB — TECHNOLOGIST REVIEW

## 2016-09-24 NOTE — Progress Notes (Signed)
  [facility] OFFICE PROGRESS NOTE   Diagnosis: Pyruvate kinase deficiency  INTERVAL HISTORY:   [name] returns as scheduled. She continues Exjade. She is currently taking Exjade every other day in order to preserve her supply due to the high cost. She reports increased dyspnea with the seasonal allergies. She uses oxygen at an increased rate when she is outside. She was admitted with an upper respiratory infection and fever in February. She was felt to most likely have a viral infection. Her symptoms improved when she was treated as an outpatient with antibiotics by pulmonary medicine.  Objective:  Vital signs in last 24 hours:  Blood pressure (!) 128/46, pulse 67, temperature 98.4 F (36.9 C), temperature source Oral, resp. rate 19, height 5\' 2"  (1.575 m), weight 160 lb 1.6 oz (72.6 kg), SpO2 98 %.    Resp: Coarse rhonchi at the left posterior base, no respiratory distress Cardio: Regular rate and rhythm GI: No hepatosplenomegaly, nontender Vascular: No leg edema   Lab Results:  Lab Results  Component Value Date   WBC 9.5 09/24/2016   HGB 8.5 (L) 09/24/2016   HCT 27.6 (L) 09/24/2016   MCV 108.2 (H) 09/24/2016   PLT 490 (H) 09/24/2016   NEUTROABS 4.4 09/24/2016   Ferritin-pending  Medications: I have reviewed the patient's current medications.  Assessment/Plan: 1. Hereditary pyruvate kinase deficiency, chronic anemia 2. Secondary iron overload maintained on Exjade, currently taking Exjade every other day 3. History of atrial fibrillation/flutter, status post a Maze procedure 4. History of mitral regurgitation, status post mitral valve repair by Dr. [name] in February 2010 5. 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 History of right diaphragm paralysis 7. Admission 04/02/2016 with left lung pneumoni     Disposition:  She appears stable from a hematologic standpoint. We will follow-up on the ferritin level from today. She will  continue Exjade. [name] will return for lab visit in 3 months and an office visit in 6 months.  15 minutes were spent with the patient today. The majority of the time was used for counseling and coordination of care.  [name], MD  09/24/2016  3:13 PM

## 2016-09-24 NOTE — Telephone Encounter (Signed)
Lab scheduled for 3 months, per 09/24/16 los.  Lab and follow up with Dr Benay Spice, scheduled for six months per 09/24/16 los Patient was given a copy of the AVS report and appointment schedule, per 09/24/16 los.

## 2016-09-25 ENCOUNTER — Encounter: Payer: Self-pay | Admitting: *Deleted

## 2016-09-25 LAB — FERRITIN: FERRITIN: 34 ng/mL (ref 9–269)

## 2016-09-25 NOTE — Progress Notes (Signed)
Message left for Stefanie Libel regarding possible financial assistance with patient's Exjade prescription per order of Dr. Benay Spice.

## 2016-09-26 ENCOUNTER — Telehealth: Payer: Self-pay | Admitting: *Deleted

## 2016-09-26 ENCOUNTER — Encounter: Payer: Self-pay | Admitting: Oncology

## 2016-09-26 NOTE — Telephone Encounter (Signed)
Patient notified per order of Dr. Benay Spice that Ferritin looks good, to continue Exjade and to f/u as scheduled.  Patient appreciative of call and has no questions at this time.

## 2016-09-26 NOTE — Progress Notes (Signed)
Received voicemail regarding possible assistance with Exjade copay. Researched through [facility] and found the only assistance showed [employer] which it says you must have no prescription coverage. Advised RN. Came back and called [employer] and spoke with Lexine Baton whom advised me of the process once denied. She states that the patient may possibly be eligible through the assistance program once a denial has been received. Then an appeal form must be requested and letter of medical necessity and EOB would be required. Then the application would be processed under another program. Gave physician form to RN to have physician complete. Contacted patient to schedule an appointment to complete application. Patient will come on Monday 5/7 per her convenience at 11am to complete. She has been advised what to bring. She has my name and number for any additional financial questions or concerns.

## 2016-09-28 ENCOUNTER — Other Ambulatory Visit (HOSPITAL_COMMUNITY): Payer: Self-pay | Admitting: Internal Medicine

## 2016-10-01 ENCOUNTER — Ambulatory Visit: Payer: Medicare Other

## 2016-10-01 ENCOUNTER — Encounter: Payer: Self-pay | Admitting: Oncology

## 2016-10-01 NOTE — Progress Notes (Signed)
Patient came in to provide income documentation and sign application for [employer]. Explained to patient per my previous note the process for the application. Patient verbalized understanding. Faxed application to [employer]. Fax received ok.

## 2016-10-05 ENCOUNTER — Other Ambulatory Visit: Payer: Self-pay | Admitting: Cardiology

## 2016-10-11 ENCOUNTER — Ambulatory Visit
Admission: RE | Admit: 2016-10-11 | Discharge: 2016-10-11 | Disposition: A | Discharge status: Home / Self Care | Payer: Medicare Other | Source: Ambulatory Visit | Attending: Internal Medicine | Admitting: Internal Medicine

## 2016-10-11 ENCOUNTER — Other Ambulatory Visit: Payer: Self-pay | Admitting: Internal Medicine

## 2016-10-11 DIAGNOSIS — G4761 Periodic limb movement disorder: Secondary | ICD-10-CM | POA: Diagnosis not present

## 2016-10-11 DIAGNOSIS — I2729 Other secondary pulmonary hypertension: Secondary | ICD-10-CM | POA: Diagnosis not present

## 2016-10-11 DIAGNOSIS — J45909 Unspecified asthma, uncomplicated: Secondary | ICD-10-CM | POA: Diagnosis not present

## 2016-10-11 DIAGNOSIS — M545 Low back pain: Secondary | ICD-10-CM

## 2016-10-11 DIAGNOSIS — R0902 Hypoxemia: Secondary | ICD-10-CM | POA: Diagnosis not present

## 2016-10-11 DIAGNOSIS — Z1211 Encounter for screening for malignant neoplasm of colon: Secondary | ICD-10-CM | POA: Diagnosis not present

## 2016-10-11 DIAGNOSIS — G2581 Restless legs syndrome: Secondary | ICD-10-CM | POA: Diagnosis not present

## 2016-10-11 DIAGNOSIS — I481 Persistent atrial fibrillation: Secondary | ICD-10-CM | POA: Diagnosis not present

## 2016-10-11 DIAGNOSIS — D649 Anemia, unspecified: Secondary | ICD-10-CM | POA: Diagnosis not present

## 2016-10-11 DIAGNOSIS — I10 Essential (primary) hypertension: Secondary | ICD-10-CM | POA: Diagnosis not present

## 2016-10-11 DIAGNOSIS — E559 Vitamin D deficiency, unspecified: Secondary | ICD-10-CM | POA: Diagnosis not present

## 2016-10-11 DIAGNOSIS — Z91013 Allergy to seafood: Secondary | ICD-10-CM | POA: Diagnosis not present

## 2016-10-11 DIAGNOSIS — M810 Age-related osteoporosis without current pathological fracture: Secondary | ICD-10-CM | POA: Diagnosis not present

## 2016-10-11 DIAGNOSIS — Z23 Encounter for immunization: Secondary | ICD-10-CM | POA: Diagnosis not present

## 2016-10-11 DIAGNOSIS — J309 Allergic rhinitis, unspecified: Secondary | ICD-10-CM | POA: Diagnosis not present

## 2016-10-11 DIAGNOSIS — M48061 Spinal stenosis, lumbar region without neurogenic claudication: Secondary | ICD-10-CM | POA: Diagnosis not present

## 2016-10-11 DIAGNOSIS — I119 Hypertensive heart disease without heart failure: Secondary | ICD-10-CM | POA: Diagnosis not present

## 2016-10-11 DIAGNOSIS — Z0000 Encounter for general adult medical examination without abnormal findings: Secondary | ICD-10-CM | POA: Diagnosis not present

## 2016-10-11 DIAGNOSIS — Z79899 Other long term (current) drug therapy: Secondary | ICD-10-CM | POA: Diagnosis not present

## 2016-10-11 DIAGNOSIS — Z1389 Encounter for screening for other disorder: Secondary | ICD-10-CM | POA: Diagnosis not present

## 2016-10-11 DIAGNOSIS — G4733 Obstructive sleep apnea (adult) (pediatric): Secondary | ICD-10-CM | POA: Diagnosis not present

## 2016-10-26 ENCOUNTER — Encounter (HOSPITAL_COMMUNITY): Payer: Self-pay | Admitting: *Deleted

## 2016-10-26 ENCOUNTER — Ambulatory Visit (HOSPITAL_COMMUNITY)
Admission: RE | Admit: 2016-10-26 | Discharge: 2016-10-26 | Disposition: A | Discharge status: Home / Self Care | Payer: Medicare Other | Source: Ambulatory Visit | Attending: Internal Medicine | Admitting: Internal Medicine

## 2016-10-26 ENCOUNTER — Other Ambulatory Visit (HOSPITAL_COMMUNITY): Payer: Self-pay | Admitting: *Deleted

## 2016-10-26 VITALS — BP 122/62 | HR 81 | Wt 157.2 lb

## 2016-10-26 DIAGNOSIS — E669 Obesity, unspecified: Secondary | ICD-10-CM | POA: Insufficient documentation

## 2016-10-26 DIAGNOSIS — I5032 Chronic diastolic (congestive) heart failure: Secondary | ICD-10-CM | POA: Diagnosis not present

## 2016-10-26 DIAGNOSIS — F329 Major depressive disorder, single episode, unspecified: Secondary | ICD-10-CM | POA: Diagnosis not present

## 2016-10-26 DIAGNOSIS — I110 Hypertensive heart disease with heart failure: Secondary | ICD-10-CM | POA: Insufficient documentation

## 2016-10-26 DIAGNOSIS — Z7901 Long term (current) use of anticoagulants: Secondary | ICD-10-CM | POA: Diagnosis not present

## 2016-10-26 DIAGNOSIS — I4891 Unspecified atrial fibrillation: Secondary | ICD-10-CM | POA: Diagnosis not present

## 2016-10-26 DIAGNOSIS — I272 Pulmonary hypertension, unspecified: Secondary | ICD-10-CM

## 2016-10-26 DIAGNOSIS — I2721 Secondary pulmonary arterial hypertension: Secondary | ICD-10-CM | POA: Insufficient documentation

## 2016-10-26 DIAGNOSIS — I340 Nonrheumatic mitral (valve) insufficiency: Secondary | ICD-10-CM | POA: Diagnosis not present

## 2016-10-26 DIAGNOSIS — K219 Gastro-esophageal reflux disease without esophagitis: Secondary | ICD-10-CM | POA: Insufficient documentation

## 2016-10-26 DIAGNOSIS — Z9889 Other specified postprocedural states: Secondary | ICD-10-CM | POA: Insufficient documentation

## 2016-10-26 DIAGNOSIS — I480 Paroxysmal atrial fibrillation: Secondary | ICD-10-CM | POA: Diagnosis not present

## 2016-10-26 DIAGNOSIS — J45909 Unspecified asthma, uncomplicated: Secondary | ICD-10-CM | POA: Diagnosis not present

## 2016-10-26 DIAGNOSIS — Z79899 Other long term (current) drug therapy: Secondary | ICD-10-CM | POA: Insufficient documentation

## 2016-10-26 DIAGNOSIS — J9610 Chronic respiratory failure, unspecified whether with hypoxia or hypercapnia: Secondary | ICD-10-CM | POA: Insufficient documentation

## 2016-10-26 DIAGNOSIS — Z9981 Dependence on supplemental oxygen: Secondary | ICD-10-CM | POA: Insufficient documentation

## 2016-10-26 DIAGNOSIS — D649 Anemia, unspecified: Secondary | ICD-10-CM | POA: Diagnosis not present

## 2016-10-26 DIAGNOSIS — G4733 Obstructive sleep apnea (adult) (pediatric): Secondary | ICD-10-CM | POA: Insufficient documentation

## 2016-10-26 LAB — BASIC METABOLIC PANEL
Anion gap: 9 (ref 5–15)
BUN: 26 mg/dL — AB (ref 6–20)
CHLORIDE: 91 mmol/L — AB (ref 101–111)
CO2: 37 mmol/L — ABNORMAL HIGH (ref 22–32)
CREATININE: 1.13 mg/dL — AB (ref 0.44–1.00)
Calcium: 9.6 mg/dL (ref 8.9–10.3)
GFR calc Af Amer: 57 mL/min — ABNORMAL LOW (ref >60)
GFR calc non Af Amer: 49 mL/min — ABNORMAL LOW (ref >60)
Glucose, Bld: 106 mg/dL — ABNORMAL HIGH (ref 65–99)
Potassium: 2.8 mmol/L — ABNORMAL LOW (ref 3.5–5.1)
SODIUM: 137 mmol/L (ref 135–145)

## 2016-10-26 LAB — CBC
HCT: 28.9 % — ABNORMAL LOW (ref 36.0–46.0)
Hemoglobin: 8.8 g/dL — ABNORMAL LOW (ref 12.0–15.0)
MCH: 33.3 pg (ref 26.0–34.0)
MCHC: 30.4 g/dL (ref 30.0–36.0)
MCV: 109.5 fL — AB (ref 78.0–100.0)
PLATELETS: 486 10*3/uL — AB (ref 150–400)
RBC: 2.64 MIL/uL — ABNORMAL LOW (ref 3.87–5.11)
RDW: 16.8 % — AB (ref 11.5–15.5)
WBC: 11.7 10*3/uL — AB (ref 4.0–10.5)

## 2016-10-26 MED ORDER — METOLAZONE 2.5 MG PO TABS: 2.5000 mg | ORAL_TABLET | ORAL | 3 refills | Status: DC | PRN

## 2016-10-26 NOTE — Progress Notes (Signed)
    ReDS Vest - 10/26/16 1100      ReDS Vest   MR  No   Fitting Posture Standing   Height Marker Short   Ruler Value 16   Center Strip Aligned   ReDS Value 27

## 2016-10-26 NOTE — Patient Instructions (Addendum)
Your physician has recommended that you have a pulmonary function test. Pulmonary Function Tests are a group of tests that measure how well air moves in and out of your lungs.  Your physician has recommended that you have a Cardioversion (DCCV). Electrical Cardioversion uses a jolt of electricity to your heart either through paddles or wired patches attached to your chest. This is a controlled, usually prescheduled, procedure. Defibrillation is done under light anesthesia in the hospital, and you usually go home the day of the procedure. This is done to get your heart back into a normal rhythm. You are not awake for the procedure. Please see the instruction sheet given to you today.  Right Heart Catheterization on Tue 6/12, see instruction sheet  Your physician recommends that you schedule a follow-up appointment in: 2 months.  Pulmonary Artery Catheterization Pulmonary artery catheterization is a procedure that is used to test blood movement through the heart and to monitor the heart's function. In this procedure, a thin, flexible tube (catheter) is passed into the right side of the heart and into the main artery that carries blood from your heart to your lungs (pulmonary artery). The procedure may be used to evaluate or help diagnose various problems, such as:  Heart failure.  Shock.  Leaky heart valves (valvular regurgitation).  Congenital heart disease.  Burns.  Kidney disease.  High blood pressure within the arteries in the lungs (pulmonary hypertension).  A buildup of fluid around the heart that prevents the heart from functioning normally (cardiac tamponade).  A disease that causes the heart muscle to become rigid (restrictive cardiomyopathy).  Abnormal blood flow between two areas of the heart (shunt).  After a heart attack, this procedure may be used to monitor for further problems and to see if medicines are working. The procedure may be done in a cardiac catheterization lab  or in an intensive care unit (ICU). Tell a health care provider about:  Any allergies you have.  All medicines you are taking, including vitamins, herbs, eye drops, creams, and over-the-counter medicines.  Any problems you or family members have had with anesthetic medicines.  Any blood disorders you have.  Any surgeries you have had.  Any medical conditions you have. What are the risks? Generally, this is a safe procedure. However, problems may occur, including:  Bruising or bleeding at the catheter insertion site.  Injury to the vein where the catheter was inserted.  Puncture to the lung. This is a risk if neck or chest veins are used.  The following problems may also occur, but they are very rare:  Abnormal heart rhythms.  Low blood pressure.  Infection.  Cardiac tamponade.  Blocked blood vessel caused by a blood clot or foreign material circulating in the blood (embolism). This can be caused by blood clots at the tip of the catheter.  What happens before the procedure?  Follow instructions from your health care provider about eating or drinking restrictions.  Ask your health care provider about: ? Changing or stopping your regular medicines. This is especially important if you are taking diabetes medicines or blood thinners. ? Taking medicines such as aspirin and ibuprofen. These medicines can thin your blood. Do not take these medicines before your procedure if your health care provider instructs you not to. What happens during the procedure?  An IV tube will be inserted into one of your veins.  You may be given a medicine that helps you relax (sedative).  The area of your body that is  chosen for insertion of the catheter will be cleaned. This is usually the neck or groin, but the insertion is sometimes done in another area. ? You will be given a medicine that numbs this area (local anesthetic). ? A small incision will be made in a vein in this area.  A  catheter will be inserted through the incision and into the vein. The health care provider will carefully move the catheter into the upper chamber of the heart (right atrium). X-rays may be used to help guide the catheter to the right place.  The catheter will be threaded through two heart valves (tricuspid valve and pulmonary valve) and placed into the pulmonary artery.  As soon as the catheter is in place, the blood pressure in the pulmonary artery will be measured.  During the procedure, your heart's rhythm will be watched constantly using an electrocardiogram (ECG).  The catheter will be removed after tests and monitoring have been completed. The procedure may vary among health care providers and hospitals. What happens after the procedure?  Your blood pressure, heart rate, breathing rate, and blood oxygen level will be monitored often until the medicines you were given have worn off. This information is not intended to replace advice given to you by your health care provider. Make sure you discuss any questions you have with your health care provider. Document Released: 09/17/2006 Document Revised: 10/20/2015 Document Reviewed: 05/18/2014 Elsevier Interactive Patient Education  2018 [employer].   Hospital doctor cardioversion is the delivery of a jolt of electricity to restore a normal rhythm to the heart. A rhythm that is too fast or is not regular keeps the heart from pumping well. In this procedure, sticky patches or metal paddles are placed on the chest to deliver electricity to the heart from a device. This procedure may be done in an emergency if:  There is low or no blood pressure as a result of the heart rhythm.  Normal rhythm must be restored as fast as possible to protect the brain and heart from further damage.  It may save a life.  This procedure may also be done for irregular or fast heart rhythms that are not immediately life-threatening. Tell a  health care provider about:  Any allergies you have.  All medicines you are taking, including vitamins, herbs, eye drops, creams, and over-the-counter medicines.  Any problems you or family members have had with anesthetic medicines.  Any blood disorders you have.  Any surgeries you have had.  Any medical conditions you have.  Whether you are pregnant or may be pregnant. What are the risks? Generally, this is a safe procedure. However, problems may occur, including:  Allergic reactions to medicines.  A blood clot that breaks free and travels to other parts of your body.  The possible return of an abnormal heart rhythm within hours or days after the procedure.  Your heart stopping (cardiac arrest). This is rare.  What happens before the procedure? Medicines  Your health care provider may have you start taking: ? Blood-thinning medicines (anticoagulants) so your blood does not clot as easily. ? Medicines may be given to help stabilize your heart rate and rhythm.  Ask your health care provider about changing or stopping your regular medicines. This is especially important if you are taking diabetes medicines or blood thinners. General instructions  Plan to have someone take you home from the hospital or clinic.  If you will be going home right after the procedure, plan to have  someone with you for 24 hours.  Follow instructions from your health care provider about eating or drinking restrictions. What happens during the procedure?  To lower your risk of infection: ? Your health care team will wash or sanitize their hands. ? Your skin will be washed with soap.  An IV tube will be inserted into one of your veins.  You will be given a medicine to help you relax (sedative).  Sticky patches (electrodes) or metal paddles may be placed on your chest.  An electrical shock will be delivered. The procedure may vary among health care providers and hospitals. What happens  after the procedure?  Your blood pressure, heart rate, breathing rate, and blood oxygen level will be monitored until the medicines you were given have worn off.  Do not drive for 24 hours if you were given a sedative.  Your heart rhythm will be watched to make sure it does not change. This information is not intended to replace advice given to you by your health care provider. Make sure you discuss any questions you have with your health care provider. Document Released: 05/04/2002 Document Revised: 01/11/2016 Document Reviewed: 11/18/2015 Elsevier Interactive Patient Education  2017 [employer].

## 2016-10-26 NOTE — Progress Notes (Signed)
Advanced Heart Failure Clinic Note   PCP: Dr Inda Merlin  HF: Dr. Haroldine Laws  Pulmonary: Dr Lamonte Sakai   HPI: [name] is a 66 y/o woman with h/o HTN, pyruvate kinase deficiency with hemolytic anemia, hemochromatosis, obesity, asthma, OSA (on CPAP), mitral valve prolapse s/p mini MV repair/Maze (2011 with Dr. [name]), PAF s/p RFA followed by surgical Maze 3419 and diastolic dysfunction.  Pre-op cath with normal coronaries in 2011. 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   Baseline hgb 7-9. Doesn't get transfused unless 6 or below.  Echo 11/17 EF 55-60% RV dilated. D-shaped septum RVSP 19mmHG   She presents today for f/u. Trying to stay active. Taking care of 2 grandkids. Going to store. Has good days and bad days. On bad days exhausted and hard to get around. Over past 10 days has gained 10 pounds. Took metolazone last night and lost 7 pounds. O2 requirement has gone up from 2L to 3L with activity. Sleeps with 2 pillows. No syncope or presyncope. No edema. + belly bloating    ReDS 27%   RHC 06/24/12 RA = 10  RV = 60/7/12  PA = 58/25 (40)  PCW = 19 (no significant v-waves)  Fick cardiac output/index = 6.6/3.7  PVR = 3.1 Woods  FA sat = 91%  PA sat = 66%, 67% PVR was not significantly elevated so selective pulmonary vasodilators were noted started.   RHC 6/16 RA = 17 PA = 74/31 (46) PCWP = 27 CI 3.89 PVR 2.8 WU  Echo 12/13 EF 60-65% mild LVH. Grade 2 DD. MVR stable (no MS noted). Moderate LAE. RV mildly dilated. Mod-severe TR with estimated RVSP 80-90 range (in 2011 was 40-45 mm HG) ECHO 10/22/12 EF 55% RV mildly decreased + Septal bounce. Moderate TR. RVSP 65-70. +diastolic dysfunction (likely restrictive - but tissue Doppler not performed).  ECHO 05/17/2014: EF 50-55%  TEE (4/15): EF 60-65%, severe bilateral atrial enlargement, s/p MV ring with no MR and mild mitral stenosis with meean gradient 6, MVA 1.7 cm^2.  PASP on TTE in 4/15 was 60 mmHg.  Echo  (6/16): EF 50-55%, moderate diastolic dysfunction, D-shaped interventricular septum, moderately dilated RV with mildly decreased systolic function, PA systolic pressure 60 mmHg, IVC not dilated, s/p MV repair with mild MS and trivial MR.   PFTs 2/14 FEV1 1.20L         FVC    1.68L         DLCO 36%  Labs (7/14): Potassium 4.6 BUN 16 Creatinine 0.68 hemoglobin 9, LDL 63 Labs (4/15): K 4.3, creatinine 0.9, HCT 26.5 Labs 09/21/13:  K 3.5  Labs 10/21/13 K 4.1 Creatinine 0.76 Labs 7/15: Hgb 9.1 Labs (6/16): K 3.9, creatinine 0.88 => 0.95, HCT 29.1, plts 512, BNP 811 Labs (11/24/15): K 3.4, creatinine 0.90   ROS: All systems negative except as listed in HPI, PMH and Problem List.  Past Medical History:  Diagnosis Date  . Allergic rhinitis   . Anemia   . Asthma   . Asthma   . Atrial fibrillation ([place])    s/p RFA and Maze procedure - resolved  . Atrial tachycardia ([place])   . Complication of anesthesia    trouble waking up  . Depression   . Dyspnea   . Exercise hypoxemia    supplemental O2 PRN  . Fatigue   . Gastritis    NSAID induced  . GERD (gastroesophageal reflux disease)   . Heart murmur  severe MR s/p MV repair  . Hx of asplenia    surgical  . Hypertension   . 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 Myoclonus   . Osteoporosis   . Pulmonary HTN ([place])    mild to moderate by [place] 05/2012  . Pyruvate kinase deficiency hemolytic anemia ([place])   . Syncope and collapse 2012   r/t anemia  . Trochanteric bursitis    left hip    Current Outpatient Prescriptions  Medication Sig Dispense Refill  . albuterol (PROVENTIL HFA;VENTOLIN HFA) 108 (90 Base) MCG/ACT inhaler Inhale 2 puffs into the lungs every 6 (six) hours as needed for wheezing or shortness of breath. 1 Inhaler 2  . bisoprolol (ZEBETA) 5 MG tablet TAKE 1 TABLET(5 MG) BY MOUTH TWICE DAILY 180 tablet 3  . calcium-vitamin D (OSCAL WITH D) 500-200 MG-UNIT tablet Take 1 tablet by mouth  2 (two) times daily.    . CHLORPHENIRAMINE MALEATE PO Take 4 mg by mouth at bedtime.    . cholecalciferol (VITAMIN D) 1000 UNITS tablet Take 1,000 Units by mouth 2 (two) times daily.     . clonazePAM (KLONOPIN) 1 MG tablet Take 2 mg by mouth at bedtime.     Marland Kitchen deferasirox (EXJADE) 500 MG disintegrating tablet Take 3 tablets (1,500 mg total) by mouth daily before breakfast. 90 tablet 1  . dextromethorphan (DELSYM) 30 MG/5ML liquid Take 60 mg by mouth at bedtime as needed for cough.    . diclofenac sodium (VOLTAREN) 1 % GEL Apply 2 g topically 4 (four) times daily as needed. (Patient taking differently: Apply 2 g topically 4 (four) times daily as needed (for pain). ) 100 g 0  . diltiazem (CARTIA XT) 240 MG 24 hr capsule Take 1 capsule (240 mg total) by mouth daily with breakfast. 90 capsule 3  . ELIQUIS 5 MG TABS tablet TAKE 1 TABLET BY MOUTH TWICE A DAY 60 tablet 5  . EPINEPHrine (EPI-PEN) 0.3 mg/0.3 mL DEVI Inject 0.3 mg into the muscle once as needed (for allergic reaction).     Marland Kitchen escitalopram (LEXAPRO) 10 MG tablet Take 10 mg by mouth at bedtime.     . fexofenadine (ALLEGRA) 180 MG tablet Take 180 mg by mouth daily.     . fluticasone (FLONASE) 50 MCG/ACT nasal spray Place 1 spray into both nostrils daily.    . fluticasone furoate-vilanterol (BREO ELLIPTA) 100-25 MCG/INH AEPB INHALE 1 PUFF BY MOUTH EVERY DAY (Patient taking differently: Inhale 1 puff into the lungs daily. ) 60 each 5  . folic acid (FOLVITE) 1 MG tablet Take 1 mg by mouth daily with breakfast.     . metolazone (ZAROXOLYN) 2.5 MG tablet Take 1 tablet (2.5 mg total) by mouth as needed. 8 tablet 0  . Multiple Vitamins-Minerals (AIRBORNE) TBEF Take 1 tablet by mouth 2 (two) times daily. *Dissolves in water twice a day*    . OXYGEN Inhale 3 L into the lungs continuous.     . pantoprazole (PROTONIX) 40 MG tablet Take 40 mg by mouth daily with breakfast.     . potassium chloride SA (KLOR-CON M20) 20 MEQ tablet Take 3 tablets (60 mEq  total) by mouth daily. 270 tablet 3  . Probiotic Product (ALIGN) 4 MG CAPS Take 1 tablet by mouth daily with breakfast.     . raloxifene (EVISTA) 60 MG tablet Take 60 mg by mouth daily with breakfast.     . senna-docusate (SENOKOT-S) 8.6-50 MG tablet  Take 1 tablet by mouth at bedtime as needed for mild constipation. 30 tablet 0  . sodium chloride (OCEAN) 0.65 % SOLN nasal spray Place 1 spray into both nostrils 2 (two) times daily.     Marland Kitchen spironolactone (ALDACTONE) 25 MG tablet TAKE 1/2 TABLET(12.5 MG) BY MOUTH DAILY 45 tablet 0  . torsemide (DEMADEX) 20 MG tablet Take 2 tablets (40 mg total) by mouth 2 (two) times daily. 120 tablet 3  . traMADol (ULTRAM) 50 MG tablet Take 50 mg by mouth 3 (three) times daily.      No current facility-administered medications for this encounter.    Social History  Substance Use Topics  . Smoking status: Never Smoker  . Smokeless tobacco: Never Used  . Alcohol use Yes     Comment: socially glass red wine monthly   Family History  Problem Relation Age of Onset  . Breast cancer Mother 103       ER+  . Melanoma Father 74  . Adrenal disorder Sister        pheochromacytoma - rt. adrenal gland  . COPD Maternal Aunt   . Stomach cancer Paternal Uncle        onset in 60s  . Lung cancer Maternal Grandmother        died late 84s, heavy smoker  . Heart disease Maternal Grandfather        hardening of the arteries, poor circulation  . Ovarian cancer Sister 81       stage IV, now 70    Vitals:   10/26/16 1056  BP: 122/62  Pulse: 81  SpO2: 94%  Weight: 157 lb 4 oz (71.3 kg)   Wt Readings from Last 3 Encounters:  10/26/16 157 lb 4 oz (71.3 kg)  09/24/16 160 lb 1.6 oz (72.6 kg)  09/17/16 160 lb 12.8 oz (72.9 kg)     PHYSICAL EXAM: General: Ambulated in the clinic without difficulty. Wearing 2 liters oxygen. NAD HEENT: Normal anicteric Neck: Supple. JVP 6-7 cm. Carotids 2+ bilaterally; no bruits. No thyromegaly or nodule noted.   Cor: PMI  non-displaced. IRR. 1/6 HSM LLSB. Loud P2 no RV lift.   Lungs: Diminished basilar sounds on 02 at 2L Abdomen: Soft, NT, ND, no HSM. No bruits or masses. +BS  Extremities: No cyanosis, clubbing, or rash. trace edema.   Neuro: Alert & oriented x 3. Cranial nerves grossly intact. Moves all 4 extremities w/o difficulty. Affect pleasant  ECG: AF 77 rightward axis Personally reviewed    ASSESSMENT & PLAN:  1. Atrial fibrillation, paroxysmal:  - She Is back in AF today. Duration unknown but suspect it is contributing to her symptoms.  -Continue Eliquis -Will arrange for DCCV - Continue bisoprolol 5 mg BID and diltiazem CD 240 mg daily 2. Pulmonary arterial hypertension: PVR was only 2.8 WU on 6/16 RHC. The PAH appears to be primarily Group 3 in the setting of OSA and chronic respiratory failure (on home oxygen, ?OHS).  Echo 6/16 showed that the RV is moderately dilated with mildly decreased systolic function. ECHO 03/2016 EF 55-60% Peak PA pressure 86 mm hg.   - By echo now with progressive PAH and I am concerned she may be developing a component of WHO Group I disease. NYHA III-IIIB with increasing O2 demand - Will schedule RHC 2-3 weeks after DC-CV - Continue OSA at night and oxygen during the day.   3. Chronic diastolic CHF: - Volume status labile. Improved today after metolazone dose yesterday. - ReDS 27%  -  Continue torsemide + prn metolazone 2.5 mg - .Reinforced fluid restriction to < 2 L daily, sodium restriction to less than 2000 mg daily, and the importance of daily weights.    4. OHS/OSA:  -Stable on home oxygen and CPAP. Per Pulmonary. No change.  5. 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   - Follows with Dr. Benay Spice. Has been stable.   6. S/p MV repair:  - Stable with mild residual MS. Echo reviewed personally today..  Total time spent 40 minutes. Over half that time spent discussing above.   Amanda Bickers, MD  10/26/2016

## 2016-10-31 ENCOUNTER — Telehealth (HOSPITAL_COMMUNITY): Payer: Self-pay | Admitting: *Deleted

## 2016-10-31 DIAGNOSIS — E876 Hypokalemia: Secondary | ICD-10-CM

## 2016-10-31 MED ORDER — POTASSIUM CHLORIDE CRYS ER 20 MEQ PO TBCR: EXTENDED_RELEASE_TABLET | ORAL | 3 refills | Status: DC

## 2016-10-31 NOTE — Telephone Encounter (Signed)
Notes recorded by Scarlette Calico, RN on 10/31/2016 at 2:11 PM EDT Pt aware, agreeable and verbalizes understanding, new rx sent in, repeat labs sch 6/11

## 2016-10-31 NOTE — Telephone Encounter (Signed)
-----   Message from Jolaine Artist, MD sent at 10/30/2016  8:51 PM EDT ----- Was this called back? Needs kcl 80 asap. Then increase kcl by 40 daily. Repeat on Monday. Thanks

## 2016-11-01 ENCOUNTER — Ambulatory Visit (HOSPITAL_COMMUNITY)
Admission: RE | Admit: 2016-11-01 | Discharge: 2016-11-01 | Disposition: A | Discharge status: Home / Self Care | Payer: Medicare Other | Source: Ambulatory Visit | Attending: Internal Medicine | Admitting: Internal Medicine

## 2016-11-01 DIAGNOSIS — J449 Chronic obstructive pulmonary disease, unspecified: Secondary | ICD-10-CM | POA: Insufficient documentation

## 2016-11-01 DIAGNOSIS — I272 Pulmonary hypertension, unspecified: Secondary | ICD-10-CM | POA: Diagnosis not present

## 2016-11-01 LAB — PULMONARY FUNCTION TEST
DL/VA % pred: 56 %
DL/VA: 2.59 ml/min/mmHg/L
DLCO UNC % PRED: 30 %
DLCO unc: 6.52 ml/min/mmHg
FEF 25-75 PRE: 0.55 L/s
FEF 25-75 Post: 0.75 L/sec
FEF2575-%CHANGE-POST: 37 %
FEF2575-%Pred-Post: 40 %
FEF2575-%Pred-Pre: 29 %
FEV1-%Change-Post: 6 %
FEV1-%PRED-POST: 47 %
FEV1-%Pred-Pre: 44 %
FEV1-POST: 1.02 L
FEV1-Pre: 0.96 L
FEV1FVC-%Change-Post: 0 %
FEV1FVC-%Pred-Pre: 94 %
FEV6-%Change-Post: 4 %
FEV6-%PRED-POST: 51 %
FEV6-%PRED-PRE: 49 %
FEV6-POST: 1.38 L
FEV6-PRE: 1.33 L
FEV6FVC-%CHANGE-POST: -2 %
FEV6FVC-%PRED-POST: 101 %
FEV6FVC-%PRED-PRE: 104 %
FVC-%CHANGE-POST: 7 %
FVC-%Pred-Post: 50 %
FVC-%Pred-Pre: 47 %
FVC-Post: 1.42 L
FVC-Pre: 1.33 L
POST FEV6/FVC RATIO: 97 %
Post FEV1/FVC ratio: 71 %
Pre FEV1/FVC ratio: 72 %
Pre FEV6/FVC Ratio: 100 %
RV % PRED: 79 %
RV: 1.62 L
TLC % PRED: 63 %
TLC: 3.04 L

## 2016-11-01 MED ORDER — ALBUTEROL SULFATE (2.5 MG/3ML) 0.083% IN NEBU
2.5000 mg | INHALATION_SOLUTION | Freq: Once | RESPIRATORY_TRACT | Status: AC
  Administered 2016-11-01: 2.5 mg via RESPIRATORY_TRACT

## 2016-11-05 ENCOUNTER — Ambulatory Visit (HOSPITAL_COMMUNITY)
Admission: RE | Admit: 2016-11-05 | Discharge: 2016-11-05 | Disposition: A | Discharge status: Home / Self Care | Payer: Medicare Other | Source: Ambulatory Visit | Attending: Internal Medicine | Admitting: Internal Medicine

## 2016-11-05 DIAGNOSIS — E876 Hypokalemia: Secondary | ICD-10-CM

## 2016-11-05 LAB — BASIC METABOLIC PANEL
Anion gap: 8 (ref 5–15)
BUN: 20 mg/dL (ref 6–20)
CO2: 30 mmol/L (ref 22–32)
CREATININE: 1.14 mg/dL — AB (ref 0.44–1.00)
Calcium: 9.1 mg/dL (ref 8.9–10.3)
Chloride: 98 mmol/L — ABNORMAL LOW (ref 101–111)
GFR calc Af Amer: 56 mL/min — ABNORMAL LOW (ref >60)
GFR calc non Af Amer: 48 mL/min — ABNORMAL LOW (ref >60)
GLUCOSE: 98 mg/dL (ref 65–99)
Potassium: 3.6 mmol/L (ref 3.5–5.1)
Sodium: 136 mmol/L (ref 135–145)

## 2016-11-06 ENCOUNTER — Other Ambulatory Visit (HOSPITAL_COMMUNITY): Payer: Self-pay | Admitting: Internal Medicine

## 2016-11-08 ENCOUNTER — Ambulatory Visit (HOSPITAL_COMMUNITY): Payer: Medicare Other | Admitting: Critical Care Medicine

## 2016-11-08 ENCOUNTER — Encounter (HOSPITAL_COMMUNITY)
Admission: RE | Disposition: A | Discharge status: Home / Self Care | Payer: Self-pay | Source: Ambulatory Visit | Attending: Internal Medicine

## 2016-11-08 ENCOUNTER — Ambulatory Visit (HOSPITAL_COMMUNITY)
Admission: RE | Admit: 2016-11-08 | Discharge: 2016-11-08 | Disposition: A | Discharge status: Home / Self Care | Payer: Medicare Other | Source: Ambulatory Visit | Attending: Internal Medicine | Admitting: Internal Medicine

## 2016-11-08 ENCOUNTER — Encounter (HOSPITAL_COMMUNITY): Payer: Self-pay | Admitting: *Deleted

## 2016-11-08 DIAGNOSIS — I5032 Chronic diastolic (congestive) heart failure: Secondary | ICD-10-CM | POA: Insufficient documentation

## 2016-11-08 DIAGNOSIS — I509 Heart failure, unspecified: Secondary | ICD-10-CM | POA: Diagnosis not present

## 2016-11-08 DIAGNOSIS — J45909 Unspecified asthma, uncomplicated: Secondary | ICD-10-CM | POA: Insufficient documentation

## 2016-11-08 DIAGNOSIS — E669 Obesity, unspecified: Secondary | ICD-10-CM | POA: Diagnosis not present

## 2016-11-08 DIAGNOSIS — I480 Paroxysmal atrial fibrillation: Secondary | ICD-10-CM | POA: Diagnosis not present

## 2016-11-08 DIAGNOSIS — I4892 Unspecified atrial flutter: Secondary | ICD-10-CM | POA: Insufficient documentation

## 2016-11-08 DIAGNOSIS — K219 Gastro-esophageal reflux disease without esophagitis: Secondary | ICD-10-CM | POA: Insufficient documentation

## 2016-11-08 DIAGNOSIS — F329 Major depressive disorder, single episode, unspecified: Secondary | ICD-10-CM | POA: Insufficient documentation

## 2016-11-08 DIAGNOSIS — I2721 Secondary pulmonary arterial hypertension: Secondary | ICD-10-CM | POA: Diagnosis not present

## 2016-11-08 DIAGNOSIS — Z7901 Long term (current) use of anticoagulants: Secondary | ICD-10-CM | POA: Diagnosis not present

## 2016-11-08 DIAGNOSIS — Z7951 Long term (current) use of inhaled steroids: Secondary | ICD-10-CM | POA: Insufficient documentation

## 2016-11-08 DIAGNOSIS — Z9981 Dependence on supplemental oxygen: Secondary | ICD-10-CM | POA: Insufficient documentation

## 2016-11-08 DIAGNOSIS — Z79899 Other long term (current) drug therapy: Secondary | ICD-10-CM | POA: Insufficient documentation

## 2016-11-08 DIAGNOSIS — I110 Hypertensive heart disease with heart failure: Secondary | ICD-10-CM | POA: Insufficient documentation

## 2016-11-08 DIAGNOSIS — Z79891 Long term (current) use of opiate analgesic: Secondary | ICD-10-CM | POA: Insufficient documentation

## 2016-11-08 DIAGNOSIS — I4891 Unspecified atrial fibrillation: Secondary | ICD-10-CM

## 2016-11-08 DIAGNOSIS — I10 Essential (primary) hypertension: Secondary | ICD-10-CM | POA: Diagnosis not present

## 2016-11-08 DIAGNOSIS — G4733 Obstructive sleep apnea (adult) (pediatric): Secondary | ICD-10-CM | POA: Diagnosis not present

## 2016-11-08 HISTORY — PX: CARDIOVERSION: SHX1299

## 2016-11-08 SURGERY — CARDIOVERSION
Anesthesia: General

## 2016-11-08 MED ORDER — SODIUM CHLORIDE 0.9 % IV SOLN
INTRAVENOUS | Status: DC
  Administered 2016-11-08: 12:00:00 via INTRAVENOUS

## 2016-11-08 MED ORDER — PROPOFOL 10 MG/ML IV BOLUS
INTRAVENOUS | Status: DC | PRN
  Administered 2016-11-08: 70 mg via INTRAVENOUS

## 2016-11-08 MED ORDER — LIDOCAINE 2% (20 MG/ML) 5 ML SYRINGE
INTRAMUSCULAR | Status: DC | PRN
  Administered 2016-11-08: 20 mg via INTRAVENOUS

## 2016-11-08 NOTE — Anesthesia Postprocedure Evaluation (Signed)
Anesthesia Post Note  Patient: [name]  Procedure(s) Performed: Procedure(s) (LRB): CARDIOVERSION (N/A)     Patient location during evaluation: Endoscopy Anesthesia Type: General Level of consciousness: awake, awake and alert and oriented Pain management: pain level controlled Vital Signs Assessment: post-procedure vital signs reviewed and stable Respiratory status: spontaneous breathing, nonlabored ventilation and respiratory function stable Cardiovascular status: blood pressure returned to baseline Anesthetic complications: no    Last Vitals:  Vitals:   11/08/16 1325 11/08/16 1330  BP: 111/61 126/73  Pulse:  (!) 55  Resp:  (!) 27  Temp:      Last Pain:  Vitals:   11/08/16 1315  TempSrc: Oral                 Dmarco Baldus COKER

## 2016-11-08 NOTE — Progress Notes (Signed)
Pt wears oxygen at home 2 to 3 L. Switched to her personal O2 device at 1330 for her to dress with husband assisting her.

## 2016-11-08 NOTE — Transfer of Care (Signed)
Immediate Anesthesia Transfer of Care Note  Patient: [name]  Procedure(s) Performed: Procedure(s): CARDIOVERSION (N/A)  Patient Location: Endoscopy Unit  Anesthesia Type:General  Level of Consciousness: awake and alert   Airway & Oxygen Therapy: Patient Spontanous Breathing and Patient connected to nasal cannula oxygen  Post-op Assessment: Report given to RN, Post -op Vital signs reviewed and stable and Patient moving all extremities X 4  Post vital signs: Reviewed and stable  Last Vitals:  Vitals:   11/08/16 1149  BP: (!) 124/59  Pulse: 74  Resp: 13  Temp: 36.9 C    Last Pain:  Vitals:   11/08/16 1149  TempSrc: Oral         Complications: No apparent anesthesia complications

## 2016-11-08 NOTE — H&P (View-Only) (Signed)
Advanced Heart Failure Clinic Note   PCP: Dr Inda Merlin  HF: Dr. Haroldine Laws  Pulmonary: Dr Lamonte Sakai   HPI: [name] is a 66 y/o woman with h/o HTN, pyruvate kinase deficiency with hemolytic anemia, hemochromatosis, obesity, asthma, OSA (on CPAP), mitral valve prolapse s/p mini MV repair/Maze (2011 with Dr. [name]), PAF s/p RFA followed by surgical Maze 3220 and diastolic dysfunction.  Pre-op cath with normal coronaries in 2011. 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   Baseline hgb 7-9. Doesn't get transfused unless 6 or below.  Echo 11/17 EF 55-60% RV dilated. D-shaped septum RVSP 25mmHG   She presents today for f/u. Trying to stay active. Taking care of 2 grandkids. Going to store. Has good days and bad days. On bad days exhausted and hard to get around. Over past 10 days has gained 10 pounds. Took metolazone last night and lost 7 pounds. O2 requirement has gone up from 2L to 3L with activity. Sleeps with 2 pillows. No syncope or presyncope. No edema. + belly bloating    ReDS 27%   RHC 06/24/12 RA = 10  RV = 60/7/12  PA = 58/25 (40)  PCW = 19 (no significant v-waves)  Fick cardiac output/index = 6.6/3.7  PVR = 3.1 Woods  FA sat = 91%  PA sat = 66%, 67% PVR was not significantly elevated so selective pulmonary vasodilators were noted started.   RHC 6/16 RA = 17 PA = 74/31 (46) PCWP = 27 CI 3.89 PVR 2.8 WU  Echo 12/13 EF 60-65% mild LVH. Grade 2 DD. MVR stable (no MS noted). Moderate LAE. RV mildly dilated. Mod-severe TR with estimated RVSP 80-90 range (in 2011 was 40-45 mm HG) ECHO 10/22/12 EF 55% RV mildly decreased + Septal bounce. Moderate TR. RVSP 65-70. +diastolic dysfunction (likely restrictive - but tissue Doppler not performed).  ECHO 05/17/2014: EF 50-55%  TEE (4/15): EF 60-65%, severe bilateral atrial enlargement, s/p MV ring with no MR and mild mitral stenosis with meean gradient 6, MVA 1.7 cm^2.  PASP on TTE in 4/15 was 60 mmHg.  Echo  (6/16): EF 50-55%, moderate diastolic dysfunction, D-shaped interventricular septum, moderately dilated RV with mildly decreased systolic function, PA systolic pressure 60 mmHg, IVC not dilated, s/p MV repair with mild MS and trivial MR.   PFTs 2/14 FEV1 1.20L         FVC    1.68L         DLCO 36%  Labs (7/14): Potassium 4.6 BUN 16 Creatinine 0.68 hemoglobin 9, LDL 63 Labs (4/15): K 4.3, creatinine 0.9, HCT 26.5 Labs 09/21/13:  K 3.5  Labs 10/21/13 K 4.1 Creatinine 0.76 Labs 7/15: Hgb 9.1 Labs (6/16): K 3.9, creatinine 0.88 => 0.95, HCT 29.1, plts 512, BNP 811 Labs (11/24/15): K 3.4, creatinine 0.90   ROS: All systems negative except as listed in HPI, PMH and Problem List.  Past Medical History:  Diagnosis Date  . Allergic rhinitis   . Anemia   . Asthma   . Asthma   . Atrial fibrillation ([place])    s/p RFA and Maze procedure - resolved  . Atrial tachycardia ([place])   . Complication of anesthesia    trouble waking up  . Depression   . Dyspnea   . Exercise hypoxemia    supplemental O2 PRN  . Fatigue   . Gastritis    NSAID induced  . GERD (gastroesophageal reflux disease)   . Heart murmur  severe MR s/p MV repair  . Hx of asplenia    surgical  . Hypertension   . 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 Myoclonus   . Osteoporosis   . Pulmonary HTN ([place])    mild to moderate by [place] 05/2012  . Pyruvate kinase deficiency hemolytic anemia ([place])   . Syncope and collapse 2012   r/t anemia  . Trochanteric bursitis    left hip    Current Outpatient Prescriptions  Medication Sig Dispense Refill  . albuterol (PROVENTIL HFA;VENTOLIN HFA) 108 (90 Base) MCG/ACT inhaler Inhale 2 puffs into the lungs every 6 (six) hours as needed for wheezing or shortness of breath. 1 Inhaler 2  . bisoprolol (ZEBETA) 5 MG tablet TAKE 1 TABLET(5 MG) BY MOUTH TWICE DAILY 180 tablet 3  . calcium-vitamin D (OSCAL WITH D) 500-200 MG-UNIT tablet Take 1 tablet by mouth  2 (two) times daily.    . CHLORPHENIRAMINE MALEATE PO Take 4 mg by mouth at bedtime.    . cholecalciferol (VITAMIN D) 1000 UNITS tablet Take 1,000 Units by mouth 2 (two) times daily.     . clonazePAM (KLONOPIN) 1 MG tablet Take 2 mg by mouth at bedtime.     Marland Kitchen deferasirox (EXJADE) 500 MG disintegrating tablet Take 3 tablets (1,500 mg total) by mouth daily before breakfast. 90 tablet 1  . dextromethorphan (DELSYM) 30 MG/5ML liquid Take 60 mg by mouth at bedtime as needed for cough.    . diclofenac sodium (VOLTAREN) 1 % GEL Apply 2 g topically 4 (four) times daily as needed. (Patient taking differently: Apply 2 g topically 4 (four) times daily as needed (for pain). ) 100 g 0  . diltiazem (CARTIA XT) 240 MG 24 hr capsule Take 1 capsule (240 mg total) by mouth daily with breakfast. 90 capsule 3  . ELIQUIS 5 MG TABS tablet TAKE 1 TABLET BY MOUTH TWICE A DAY 60 tablet 5  . EPINEPHrine (EPI-PEN) 0.3 mg/0.3 mL DEVI Inject 0.3 mg into the muscle once as needed (for allergic reaction).     Marland Kitchen escitalopram (LEXAPRO) 10 MG tablet Take 10 mg by mouth at bedtime.     . fexofenadine (ALLEGRA) 180 MG tablet Take 180 mg by mouth daily.     . fluticasone (FLONASE) 50 MCG/ACT nasal spray Place 1 spray into both nostrils daily.    . fluticasone furoate-vilanterol (BREO ELLIPTA) 100-25 MCG/INH AEPB INHALE 1 PUFF BY MOUTH EVERY DAY (Patient taking differently: Inhale 1 puff into the lungs daily. ) 60 each 5  . folic acid (FOLVITE) 1 MG tablet Take 1 mg by mouth daily with breakfast.     . metolazone (ZAROXOLYN) 2.5 MG tablet Take 1 tablet (2.5 mg total) by mouth as needed. 8 tablet 0  . Multiple Vitamins-Minerals (AIRBORNE) TBEF Take 1 tablet by mouth 2 (two) times daily. *Dissolves in water twice a day*    . OXYGEN Inhale 3 L into the lungs continuous.     . pantoprazole (PROTONIX) 40 MG tablet Take 40 mg by mouth daily with breakfast.     . potassium chloride SA (KLOR-CON M20) 20 MEQ tablet Take 3 tablets (60 mEq  total) by mouth daily. 270 tablet 3  . Probiotic Product (ALIGN) 4 MG CAPS Take 1 tablet by mouth daily with breakfast.     . raloxifene (EVISTA) 60 MG tablet Take 60 mg by mouth daily with breakfast.     . senna-docusate (SENOKOT-S) 8.6-50 MG tablet  Take 1 tablet by mouth at bedtime as needed for mild constipation. 30 tablet 0  . sodium chloride (OCEAN) 0.65 % SOLN nasal spray Place 1 spray into both nostrils 2 (two) times daily.     Marland Kitchen spironolactone (ALDACTONE) 25 MG tablet TAKE 1/2 TABLET(12.5 MG) BY MOUTH DAILY 45 tablet 0  . torsemide (DEMADEX) 20 MG tablet Take 2 tablets (40 mg total) by mouth 2 (two) times daily. 120 tablet 3  . traMADol (ULTRAM) 50 MG tablet Take 50 mg by mouth 3 (three) times daily.      No current facility-administered medications for this encounter.    Social History  Substance Use Topics  . Smoking status: Never Smoker  . Smokeless tobacco: Never Used  . Alcohol use Yes     Comment: socially glass red wine monthly   Family History  Problem Relation Age of Onset  . Breast cancer Mother 43       ER+  . Melanoma Father 57  . Adrenal disorder Sister        pheochromacytoma - rt. adrenal gland  . COPD Maternal Aunt   . Stomach cancer Paternal Uncle        onset in 49s  . Lung cancer Maternal Grandmother        died late 21s, heavy smoker  . Heart disease Maternal Grandfather        hardening of the arteries, poor circulation  . Ovarian cancer Sister 5       stage IV, now 61    Vitals:   10/26/16 1056  BP: 122/62  Pulse: 81  SpO2: 94%  Weight: 157 lb 4 oz (71.3 kg)   Wt Readings from Last 3 Encounters:  10/26/16 157 lb 4 oz (71.3 kg)  09/24/16 160 lb 1.6 oz (72.6 kg)  09/17/16 160 lb 12.8 oz (72.9 kg)     PHYSICAL EXAM: General: Ambulated in the clinic without difficulty. Wearing 2 liters oxygen. NAD HEENT: Normal anicteric Neck: Supple. JVP 6-7 cm. Carotids 2+ bilaterally; no bruits. No thyromegaly or nodule noted.   Cor: PMI  non-displaced. IRR. 1/6 HSM LLSB. Loud P2 no RV lift.   Lungs: Diminished basilar sounds on 02 at 2L Abdomen: Soft, NT, ND, no HSM. No bruits or masses. +BS  Extremities: No cyanosis, clubbing, or rash. trace edema.   Neuro: Alert & oriented x 3. Cranial nerves grossly intact. Moves all 4 extremities w/o difficulty. Affect pleasant  ECG: AF 77 rightward axis Personally reviewed    ASSESSMENT & PLAN:  1. Atrial fibrillation, paroxysmal:  - She Is back in AF today. Duration unknown but suspect it is contributing to her symptoms.  -Continue Eliquis -Will arrange for DCCV - Continue bisoprolol 5 mg BID and diltiazem CD 240 mg daily 2. Pulmonary arterial hypertension: PVR was only 2.8 WU on 6/16 RHC. The PAH appears to be primarily Group 3 in the setting of OSA and chronic respiratory failure (on home oxygen, ?OHS).  Echo 6/16 showed that the RV is moderately dilated with mildly decreased systolic function. ECHO 03/2016 EF 55-60% Peak PA pressure 86 mm hg.   - By echo now with progressive PAH and I am concerned she may be developing a component of WHO Group I disease. NYHA III-IIIB with increasing O2 demand - Will schedule RHC 2-3 weeks after DC-CV - Continue OSA at night and oxygen during the day.   3. Chronic diastolic CHF: - Volume status labile. Improved today after metolazone dose yesterday. - ReDS 27%  -  Continue torsemide + prn metolazone 2.5 mg - .Reinforced fluid restriction to < 2 L daily, sodium restriction to less than 2000 mg daily, and the importance of daily weights.    4. OHS/OSA:  -Stable on home oxygen and CPAP. Per Pulmonary. No change.  5. 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   - Follows with Dr. Benay Spice. Has been stable.   6. S/p MV repair:  - Stable with mild residual MS. Echo reviewed personally today..  Total time spent 40 minutes. Over half that time spent discussing above.   Glori Bickers, MD  10/26/2016

## 2016-11-08 NOTE — Anesthesia Preprocedure Evaluation (Deleted)
Anesthesia Evaluation    Airway Mallampati: I  TM Distance: >3 FB Neck ROM: Full    Dental  (+) Teeth Intact, Dental Advisory Given   Pulmonary           Cardiovascular hypertension,      Neuro/Psych    GI/Hepatic   Endo/Other    Renal/GU      Musculoskeletal   Abdominal   Peds  Hematology   Anesthesia Other Findings   Reproductive/Obstetrics                             Anesthesia Physical Anesthesia Plan Anesthesia Quick Evaluation

## 2016-11-08 NOTE — Discharge Instructions (Signed)
Electrical Cardioversion, Care After °This sheet gives you information about how to care for yourself after your procedure. Your health care provider may also give you more specific instructions. If you have problems or questions, contact your health care provider. °What can I expect after the procedure? °After the procedure, it is common to have: °· Some redness on the skin where the shocks were given. ° °Follow these instructions at home: °· Do not drive for 24 hours if you were given a medicine to help you relax (sedative). °· Take over-the-counter and prescription medicines only as told by your health care provider. °· Ask your health care provider how to check your pulse. Check it often. °· Rest for 48 hours after the procedure or as told by your health care provider. °· Avoid or limit your caffeine use as told by your health care provider. °Contact a health care provider if: °· You feel like your heart is beating too quickly or your pulse is not regular. °· You have a serious muscle cramp that does not go away. °Get help right away if: °· You have discomfort in your chest. °· You are dizzy or you feel faint. °· You have trouble breathing or you are short of breath. °· Your speech is slurred. °· You have trouble moving an arm or leg on one side of your body. °· Your fingers or toes turn cold or blue. °This information is not intended to replace advice given to you by your health care provider. Make sure you discuss any questions you have with your health care provider. °Document Released: 03/04/2013 Document Revised: 12/16/2015 Document Reviewed: 11/18/2015 °Elsevier Interactive Patient Education © 2018 [employer] ° °

## 2016-11-08 NOTE — CV Procedure (Signed)
    DIRECT CURRENT CARDIOVERSION  NAME:  [name]   MRN: [id number] DOB:  09-07-1950   ADMIT DATE: 11/08/2016   INDICATIONS: Atrial fibrillation/flutter    PROCEDURE:   Informed consent was obtained prior to the procedure. The risks, benefits and alternatives for the procedure were discussed and the patient comprehended these risks. Once an appropriate time out was taken, the patient had the defibrillator pads placed in the anterior and posterior position. The patient then underwent sedation by the anesthesia service. Once an appropriate level of sedation was achieved, the patient received a single biphasic, synchronized 200J shock with prompt conversion to sinus rhythm. No apparent complications.  Glori Bickers, MD  1:06 PM

## 2016-11-08 NOTE — Interval H&P Note (Signed)
History and Physical Interval Note:  11/08/2016 12:56 PM  [name]  has presented today for surgery, with the diagnosis of AFIB  The various methods of treatment have been discussed with the patient and family. After consideration of risks, benefits and other options for treatment, the patient has consented to  Procedure(s): CARDIOVERSION (N/A) as a surgical intervention .  The patient's history has been reviewed, patient examined, no change in status, stable for surgery.  I have reviewed the patient's chart and labs.  Questions were answered to the patient's satisfaction.     Ian Castagna, Quillian Quince

## 2016-11-08 NOTE — Anesthesia Preprocedure Evaluation (Addendum)
Anesthesia Evaluation  Patient identified by MRN, date of birth, ID band Patient awake    Reviewed: Allergy & Precautions, NPO status , Patient's Chart, lab work & pertinent test results  Airway Mallampati: II  TM Distance: >3 FB Neck ROM: Full    Dental  (+) Teeth Intact   Pulmonary shortness of breath, asthma , sleep apnea, Continuous Positive Airway Pressure Ventilation and Oxygen sleep apnea ,    breath sounds clear to auscultation       Cardiovascular hypertension, +CHF  + dysrhythmias Atrial Fibrillation  Rhythm:Irregular Rate:Normal     Neuro/Psych    GI/Hepatic GERD  Controlled,  Endo/Other    Renal/GU      Musculoskeletal   Abdominal   Peds  Hematology   Anesthesia Other Findings   Reproductive/Obstetrics                            Anesthesia Physical Anesthesia Plan  ASA: III  Anesthesia Plan: General   Post-op Pain Management:    Induction: Intravenous  PONV Risk Score and Plan: 1 and Ondansetron, Dexamethasone and Propofol  Airway Management Planned: Mask  Additional Equipment:   Intra-op Plan:   Post-operative Plan:   Informed Consent: I have reviewed the patients History and Physical, chart, labs and discussed the procedure including the risks, benefits and alternatives for the proposed anesthesia with the patient or authorized representative who has indicated his/her understanding and acceptance.     Plan Discussed with: CRNA and Anesthesiologist  Anesthesia Plan Comments:         Anesthesia Quick Evaluation

## 2016-11-10 ENCOUNTER — Encounter (HOSPITAL_COMMUNITY): Payer: Self-pay | Admitting: Internal Medicine

## 2016-11-22 ENCOUNTER — Ambulatory Visit: Payer: Medicare Other | Admitting: Emergency Medicine

## 2016-11-22 ENCOUNTER — Ambulatory Visit (HOSPITAL_COMMUNITY)
Admission: RE | Admit: 2016-11-22 | Discharge: 2016-11-22 | Disposition: A | Discharge status: Home / Self Care | Payer: Medicare Other | Source: Ambulatory Visit | Attending: Internal Medicine | Admitting: Internal Medicine

## 2016-11-22 ENCOUNTER — Encounter (HOSPITAL_COMMUNITY)
Admission: RE | Disposition: A | Discharge status: Home / Self Care | Payer: Self-pay | Source: Ambulatory Visit | Attending: Internal Medicine

## 2016-11-22 DIAGNOSIS — Z8249 Family history of ischemic heart disease and other diseases of the circulatory system: Secondary | ICD-10-CM | POA: Insufficient documentation

## 2016-11-22 DIAGNOSIS — J9611 Chronic respiratory failure with hypoxia: Secondary | ICD-10-CM | POA: Diagnosis not present

## 2016-11-22 DIAGNOSIS — K219 Gastro-esophageal reflux disease without esophagitis: Secondary | ICD-10-CM | POA: Diagnosis not present

## 2016-11-22 DIAGNOSIS — F329 Major depressive disorder, single episode, unspecified: Secondary | ICD-10-CM | POA: Diagnosis not present

## 2016-11-22 DIAGNOSIS — Z9981 Dependence on supplemental oxygen: Secondary | ICD-10-CM | POA: Diagnosis not present

## 2016-11-22 DIAGNOSIS — G4733 Obstructive sleep apnea (adult) (pediatric): Secondary | ICD-10-CM | POA: Insufficient documentation

## 2016-11-22 DIAGNOSIS — I5032 Chronic diastolic (congestive) heart failure: Secondary | ICD-10-CM | POA: Insufficient documentation

## 2016-11-22 DIAGNOSIS — Z801 Family history of malignant neoplasm of trachea, bronchus and lung: Secondary | ICD-10-CM | POA: Diagnosis not present

## 2016-11-22 DIAGNOSIS — Z9889 Other specified postprocedural states: Secondary | ICD-10-CM | POA: Diagnosis not present

## 2016-11-22 DIAGNOSIS — Z803 Family history of malignant neoplasm of breast: Secondary | ICD-10-CM | POA: Insufficient documentation

## 2016-11-22 DIAGNOSIS — I110 Hypertensive heart disease with heart failure: Secondary | ICD-10-CM | POA: Insufficient documentation

## 2016-11-22 DIAGNOSIS — D552 Anemia due to disorders of glycolytic enzymes: Secondary | ICD-10-CM | POA: Insufficient documentation

## 2016-11-22 DIAGNOSIS — I480 Paroxysmal atrial fibrillation: Secondary | ICD-10-CM | POA: Insufficient documentation

## 2016-11-22 DIAGNOSIS — Z79899 Other long term (current) drug therapy: Secondary | ICD-10-CM | POA: Insufficient documentation

## 2016-11-22 DIAGNOSIS — Z800 Family history of malignant neoplasm of digestive organs: Secondary | ICD-10-CM | POA: Diagnosis not present

## 2016-11-22 DIAGNOSIS — J449 Chronic obstructive pulmonary disease, unspecified: Secondary | ICD-10-CM | POA: Insufficient documentation

## 2016-11-22 DIAGNOSIS — M810 Age-related osteoporosis without current pathological fracture: Secondary | ICD-10-CM | POA: Diagnosis not present

## 2016-11-22 DIAGNOSIS — Z7901 Long term (current) use of anticoagulants: Secondary | ICD-10-CM | POA: Insufficient documentation

## 2016-11-22 DIAGNOSIS — I2721 Secondary pulmonary arterial hypertension: Secondary | ICD-10-CM | POA: Insufficient documentation

## 2016-11-22 DIAGNOSIS — Z8041 Family history of malignant neoplasm of ovary: Secondary | ICD-10-CM | POA: Insufficient documentation

## 2016-11-22 DIAGNOSIS — I471 Supraventricular tachycardia: Secondary | ICD-10-CM | POA: Insufficient documentation

## 2016-11-22 DIAGNOSIS — I272 Pulmonary hypertension, unspecified: Secondary | ICD-10-CM

## 2016-11-22 HISTORY — PX: RIGHT HEART CATH: CATH118263

## 2016-11-22 LAB — BASIC METABOLIC PANEL
Anion gap: 8 (ref 5–15)
BUN: 30 mg/dL — AB (ref 6–20)
CHLORIDE: 98 mmol/L — AB (ref 101–111)
CO2: 34 mmol/L — ABNORMAL HIGH (ref 22–32)
Calcium: 9.2 mg/dL (ref 8.9–10.3)
Creatinine, Ser: 1.3 mg/dL — ABNORMAL HIGH (ref 0.44–1.00)
GFR calc Af Amer: 48 mL/min — ABNORMAL LOW (ref >60)
GFR calc non Af Amer: 41 mL/min — ABNORMAL LOW (ref >60)
GLUCOSE: 108 mg/dL — AB (ref 65–99)
POTASSIUM: 3.8 mmol/L (ref 3.5–5.1)
Sodium: 140 mmol/L (ref 135–145)

## 2016-11-22 LAB — CBC
HEMATOCRIT: 28.3 % — AB (ref 36.0–46.0)
Hemoglobin: 8.6 g/dL — ABNORMAL LOW (ref 12.0–15.0)
MCH: 34.1 pg — ABNORMAL HIGH (ref 26.0–34.0)
MCHC: 30.4 g/dL (ref 30.0–36.0)
MCV: 112.3 fL — AB (ref 78.0–100.0)
Platelets: 515 10*3/uL — ABNORMAL HIGH (ref 150–400)
RBC: 2.52 MIL/uL — ABNORMAL LOW (ref 3.87–5.11)
RDW: 17.1 % — AB (ref 11.5–15.5)
WBC: 9.7 10*3/uL (ref 4.0–10.5)

## 2016-11-22 LAB — POCT I-STAT 3, VENOUS BLOOD GAS (G3P V)
ACID-BASE EXCESS: 8 mmol/L — AB (ref 0.0–2.0)
ACID-BASE EXCESS: 9 mmol/L — AB (ref 0.0–2.0)
BICARBONATE: 33.6 mmol/L — AB (ref 20.0–28.0)
BICARBONATE: 34.5 mmol/L — AB (ref 20.0–28.0)
O2 SAT: 69 %
O2 Saturation: 68 %
PO2 VEN: 37 mmHg (ref 32.0–45.0)
TCO2: 35 mmol/L (ref 0–100)
TCO2: 36 mmol/L (ref 0–100)
pCO2, Ven: 54.2 mmHg (ref 44.0–60.0)
pCO2, Ven: 55.3 mmHg (ref 44.0–60.0)
pH, Ven: 7.401 (ref 7.250–7.430)
pH, Ven: 7.404 (ref 7.250–7.430)
pO2, Ven: 36 mmHg (ref 32.0–45.0)

## 2016-11-22 LAB — PROTIME-INR
INR: 1.35
Prothrombin Time: 16.8 seconds — ABNORMAL HIGH (ref 11.4–15.2)

## 2016-11-22 SURGERY — RIGHT HEART CATH
Anesthesia: LOCAL

## 2016-11-22 MED ORDER — HEPARIN (PORCINE) IN NACL 2-0.9 UNIT/ML-% IJ SOLN
INTRAMUSCULAR | Status: AC
  Filled 2016-11-22: qty 500

## 2016-11-22 MED ORDER — HEPARIN (PORCINE) IN NACL 2-0.9 UNIT/ML-% IJ SOLN
INTRAMUSCULAR | Status: AC | PRN
  Administered 2016-11-22: 500 mL

## 2016-11-22 MED ORDER — ASPIRIN 81 MG PO CHEW
81.0000 mg | CHEWABLE_TABLET | ORAL | Status: AC
  Administered 2016-11-22: 81 mg via ORAL

## 2016-11-22 MED ORDER — SODIUM CHLORIDE 0.9 % IV SOLN: 250.0000 mL | INTRAVENOUS | Status: DC | PRN

## 2016-11-22 MED ORDER — SODIUM CHLORIDE 0.9 % IV SOLN
INTRAVENOUS | Status: DC
  Administered 2016-11-22: 11:00:00 via INTRAVENOUS

## 2016-11-22 MED ORDER — LIDOCAINE HCL (PF) 1 % IJ SOLN
INTRAMUSCULAR | Status: DC | PRN
  Administered 2016-11-22: 2 mL
  Administered 2016-11-22: 15 mL

## 2016-11-22 MED ORDER — ASPIRIN 81 MG PO CHEW
CHEWABLE_TABLET | ORAL | Status: AC
  Administered 2016-11-22: 81 mg via ORAL
  Filled 2016-11-22: qty 1

## 2016-11-22 MED ORDER — LIDOCAINE HCL (PF) 1 % IJ SOLN
INTRAMUSCULAR | Status: AC
  Filled 2016-11-22: qty 30

## 2016-11-22 MED ORDER — SODIUM CHLORIDE 0.9% FLUSH: 3.0000 mL | Freq: Two times a day (BID) | INTRAVENOUS | Status: DC

## 2016-11-22 MED ORDER — SODIUM CHLORIDE 0.9% FLUSH: 3.0000 mL | INTRAVENOUS | Status: DC | PRN

## 2016-11-22 SURGICAL SUPPLY — 14 items
CATH BALLN WEDGE 5F 110CM (CATHETERS) ×1 IMPLANT
CATH SWAN GANZ 7F STRAIGHT (CATHETERS) ×1 IMPLANT
GUIDEWIRE .025 260CM (WIRE) ×1 IMPLANT
KIT ESSENTIALS PG (KITS) ×1 IMPLANT
KIT HEART RIGHT NAMIC (KITS) ×1 IMPLANT
PACK CARDIAC CATHETERIZATION (CUSTOM PROCEDURE TRAY) ×2 IMPLANT
PROTECTION STATION PRESSURIZED (MISCELLANEOUS) ×2
SHEATH GLIDE SLENDER 4/5FR (SHEATH) ×1 IMPLANT
SHEATH PINNACLE 7F 10CM (SHEATH) ×1 IMPLANT
STATION PROTECTION PRESSURIZED (MISCELLANEOUS) IMPLANT
TRANSDUCER W/STOPCOCK (MISCELLANEOUS) ×2 IMPLANT
TUBING ART PRESS 72  MALE/FEM (TUBING) ×1
TUBING ART PRESS 72 MALE/FEM (TUBING) IMPLANT
WIRE EMERALD 3MM-J .025X260CM (WIRE) ×1 IMPLANT

## 2016-11-22 NOTE — Discharge Instructions (Signed)

## 2016-11-22 NOTE — CV Procedure (Signed)
Site area: Right Femoral Vein Site Prior to Removal:  Level 0 Pressure Applied For: 10 mins Manual: yes Patient Status During Pull: stable Post Pull Site: Level 0 Post Pull Instructions Given:  yes Post Pull Pulses Present: DP +2 Dressing Applied: gauze and tegaderm Bedrest begins @ 1450 Comments: 4 hours of bedrest ordered

## 2016-11-23 ENCOUNTER — Encounter (HOSPITAL_COMMUNITY): Payer: Self-pay | Admitting: Internal Medicine

## 2016-11-24 NOTE — Interval H&P Note (Signed)
History and Physical Interval Note:  11/24/2016 11:56 PM  [name]  has presented today for surgery, with the diagnosis of pulmonary hypertension  The various methods of treatment have been discussed with the patient and family. After consideration of risks, benefits and other options for treatment, the patient has consented to  Procedure(s): Right Heart Cath (N/A) as a surgical intervention .  The patient's history has been reviewed, patient examined, no change in status, stable for surgery.  I have reviewed the patient's chart and labs.  Questions were answered to the patient's satisfaction.     Etheleen Valtierra, Quillian Quince

## 2016-11-24 NOTE — H&P (View-Only) (Signed)
Advanced Heart Failure Clinic Note   PCP: Dr Inda Merlin  HF: Dr. Haroldine Laws  Pulmonary: Dr Lamonte Sakai   HPI: Ms. Biby is a 66 y/o woman with h/o HTN, pyruvate kinase deficiency with hemolytic anemia, hemochromatosis, obesity, asthma, OSA (on CPAP), mitral valve prolapse s/p mini MV repair/Maze (2011 with Dr. [name]), PAF s/p RFA followed by surgical Maze 0626 and diastolic dysfunction.  Pre-op cath with normal coronaries in 2011. 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   Baseline hgb 7-9. Doesn't get transfused unless 6 or below.  Echo 11/17 EF 55-60% RV dilated. D-shaped septum RVSP 70mmHG   She presents today for f/u. Trying to stay active. Taking care of 2 grandkids. Going to store. Has good days and bad days. On bad days exhausted and hard to get around. Over past 10 days has gained 10 pounds. Took metolazone last night and lost 7 pounds. O2 requirement has gone up from 2L to 3L with activity. Sleeps with 2 pillows. No syncope or presyncope. No edema. + belly bloating    ReDS 27%   RHC 06/24/12 RA = 10  RV = 60/7/12  PA = 58/25 (40)  PCW = 19 (no significant v-waves)  Fick cardiac output/index = 6.6/3.7  PVR = 3.1 [place]  FA sat = 91%  PA sat = 66%, 67% PVR was not significantly elevated so selective pulmonary vasodilators were noted started.   RHC 6/16 RA = 17 PA = 74/31 (46) PCWP = 27 CI 3.89 PVR 2.8 WU  Echo 12/13 EF 60-65% mild LVH. Grade 2 DD. MVR stable (no MS noted). Moderate LAE. RV mildly dilated. Mod-severe TR with estimated RVSP 80-90 range (in 2011 was 40-45 mm HG) ECHO 10/22/12 EF 55% RV mildly decreased + Septal bounce. Moderate TR. RVSP 65-70. +diastolic dysfunction (likely restrictive - but tissue Doppler not performed).  ECHO 05/17/2014: EF 50-55%  TEE (4/15): EF 60-65%, severe bilateral atrial enlargement, s/p MV ring with no MR and mild mitral stenosis with meean gradient 6, MVA 1.7 cm^2.  PASP on TTE in 4/15 was 60 mmHg.  Echo  (6/16): EF 50-55%, moderate diastolic dysfunction, D-shaped interventricular septum, moderately dilated RV with mildly decreased systolic function, PA systolic pressure 60 mmHg, IVC not dilated, s/p MV repair with mild MS and trivial MR.   PFTs 2/14 FEV1 1.20L         FVC    1.68L         DLCO 36%  Labs (7/14): Potassium 4.6 BUN 16 Creatinine 0.68 hemoglobin 9, LDL 63 Labs (4/15): K 4.3, creatinine 0.9, HCT 26.5 Labs 09/21/13:  K 3.5  Labs 10/21/13 K 4.1 Creatinine 0.76 Labs 7/15: Hgb 9.1 Labs (6/16): K 3.9, creatinine 0.88 => 0.95, HCT 29.1, plts 512, BNP 811 Labs (11/24/15): K 3.4, creatinine 0.90   ROS: All systems negative except as listed in HPI, PMH and Problem List.  Past Medical History:  Diagnosis Date  . Allergic rhinitis   . Anemia   . Asthma   . Asthma   . Atrial fibrillation ([place])    s/p RFA and Maze procedure - resolved  . Atrial tachycardia ([place])   . Complication of anesthesia    trouble waking up  . Depression   . Dyspnea   . Exercise hypoxemia    supplemental O2 PRN  . Fatigue   . Gastritis    NSAID induced  . GERD (gastroesophageal reflux disease)   . Heart murmur  severe MR s/p MV repair  . Hx of asplenia    surgical  . Hypertension   . 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 Myoclonus   . Osteoporosis   . Pulmonary HTN ([place])    mild to moderate by [place] 05/2012  . Pyruvate kinase deficiency hemolytic anemia ([place])   . Syncope and collapse 2012   r/t anemia  . Trochanteric bursitis    left hip    Current Outpatient Prescriptions  Medication Sig Dispense Refill  . albuterol (PROVENTIL HFA;VENTOLIN HFA) 108 (90 Base) MCG/ACT inhaler Inhale 2 puffs into the lungs every 6 (six) hours as needed for wheezing or shortness of breath. 1 Inhaler 2  . bisoprolol (ZEBETA) 5 MG tablet TAKE 1 TABLET(5 MG) BY MOUTH TWICE DAILY 180 tablet 3  . calcium-vitamin D (OSCAL WITH D) 500-200 MG-UNIT tablet Take 1 tablet by mouth  2 (two) times daily.    . CHLORPHENIRAMINE MALEATE PO Take 4 mg by mouth at bedtime.    . cholecalciferol (VITAMIN D) 1000 UNITS tablet Take 1,000 Units by mouth 2 (two) times daily.     . clonazePAM (KLONOPIN) 1 MG tablet Take 2 mg by mouth at bedtime.     Marland Kitchen deferasirox (EXJADE) 500 MG disintegrating tablet Take 3 tablets (1,500 mg total) by mouth daily before breakfast. 90 tablet 1  . dextromethorphan (DELSYM) 30 MG/5ML liquid Take 60 mg by mouth at bedtime as needed for cough.    . diclofenac sodium (VOLTAREN) 1 % GEL Apply 2 g topically 4 (four) times daily as needed. (Patient taking differently: Apply 2 g topically 4 (four) times daily as needed (for pain). ) 100 g 0  . diltiazem (CARTIA XT) 240 MG 24 hr capsule Take 1 capsule (240 mg total) by mouth daily with breakfast. 90 capsule 3  . ELIQUIS 5 MG TABS tablet TAKE 1 TABLET BY MOUTH TWICE A DAY 60 tablet 5  . EPINEPHrine (EPI-PEN) 0.3 mg/0.3 mL DEVI Inject 0.3 mg into the muscle once as needed (for allergic reaction).     Marland Kitchen escitalopram (LEXAPRO) 10 MG tablet Take 10 mg by mouth at bedtime.     . fexofenadine (ALLEGRA) 180 MG tablet Take 180 mg by mouth daily.     . fluticasone (FLONASE) 50 MCG/ACT nasal spray Place 1 spray into both nostrils daily.    . fluticasone furoate-vilanterol (BREO ELLIPTA) 100-25 MCG/INH AEPB INHALE 1 PUFF BY MOUTH EVERY DAY (Patient taking differently: Inhale 1 puff into the lungs daily. ) 60 each 5  . folic acid (FOLVITE) 1 MG tablet Take 1 mg by mouth daily with breakfast.     . metolazone (ZAROXOLYN) 2.5 MG tablet Take 1 tablet (2.5 mg total) by mouth as needed. 8 tablet 0  . Multiple Vitamins-Minerals (AIRBORNE) TBEF Take 1 tablet by mouth 2 (two) times daily. *Dissolves in water twice a day*    . OXYGEN Inhale 3 L into the lungs continuous.     . pantoprazole (PROTONIX) 40 MG tablet Take 40 mg by mouth daily with breakfast.     . potassium chloride SA (KLOR-CON M20) 20 MEQ tablet Take 3 tablets (60 mEq  total) by mouth daily. 270 tablet 3  . Probiotic Product (ALIGN) 4 MG CAPS Take 1 tablet by mouth daily with breakfast.     . raloxifene (EVISTA) 60 MG tablet Take 60 mg by mouth daily with breakfast.     . senna-docusate (SENOKOT-S) 8.6-50 MG tablet  Take 1 tablet by mouth at bedtime as needed for mild constipation. 30 tablet 0  . sodium chloride (OCEAN) 0.65 % SOLN nasal spray Place 1 spray into both nostrils 2 (two) times daily.     Marland Kitchen spironolactone (ALDACTONE) 25 MG tablet TAKE 1/2 TABLET(12.5 MG) BY MOUTH DAILY 45 tablet 0  . torsemide (DEMADEX) 20 MG tablet Take 2 tablets (40 mg total) by mouth 2 (two) times daily. 120 tablet 3  . traMADol (ULTRAM) 50 MG tablet Take 50 mg by mouth 3 (three) times daily.      No current facility-administered medications for this encounter.    Social History  Substance Use Topics  . Smoking status: Never Smoker  . Smokeless tobacco: Never Used  . Alcohol use Yes     Comment: socially glass red wine monthly   Family History  Problem Relation Age of Onset  . Breast cancer Mother 29       ER+  . Melanoma Father 38  . Adrenal disorder Sister        pheochromacytoma - rt. adrenal gland  . COPD Maternal Aunt   . Stomach cancer Paternal Uncle        onset in 67s  . Lung cancer Maternal Grandmother        died late 69s, heavy smoker  . Heart disease Maternal Grandfather        hardening of the arteries, poor circulation  . Ovarian cancer Sister 72       stage IV, now 23    Vitals:   10/26/16 1056  BP: 122/62  Pulse: 81  SpO2: 94%  Weight: 157 lb 4 oz (71.3 kg)   Wt Readings from Last 3 Encounters:  10/26/16 157 lb 4 oz (71.3 kg)  09/24/16 160 lb 1.6 oz (72.6 kg)  09/17/16 160 lb 12.8 oz (72.9 kg)     PHYSICAL EXAM: General: Ambulated in the clinic without difficulty. Wearing 2 liters oxygen. NAD HEENT: Normal anicteric Neck: Supple. JVP 6-7 cm. Carotids 2+ bilaterally; no bruits. No thyromegaly or nodule noted.   Cor: PMI  non-displaced. IRR. 1/6 HSM LLSB. Loud P2 no RV lift.   Lungs: Diminished basilar sounds on 02 at 2L Abdomen: Soft, NT, ND, no HSM. No bruits or masses. +BS  Extremities: No cyanosis, clubbing, or rash. trace edema.   Neuro: Alert & oriented x 3. Cranial nerves grossly intact. Moves all 4 extremities w/o difficulty. Affect pleasant  ECG: AF 77 rightward axis Personally reviewed    ASSESSMENT & PLAN:  1. Atrial fibrillation, paroxysmal:  - She Is back in AF today. Duration unknown but suspect it is contributing to her symptoms.  -Continue Eliquis -Will arrange for DCCV - Continue bisoprolol 5 mg BID and diltiazem CD 240 mg daily 2. Pulmonary arterial hypertension: PVR was only 2.8 WU on 6/16 RHC. The PAH appears to be primarily Group 3 in the setting of OSA and chronic respiratory failure (on home oxygen, ?OHS).  Echo 6/16 showed that the RV is moderately dilated with mildly decreased systolic function. ECHO 03/2016 EF 55-60% Peak PA pressure 86 mm hg.   - By echo now with progressive PAH and I am concerned she may be developing a component of WHO Group I disease. NYHA III-IIIB with increasing O2 demand - Will schedule RHC 2-3 weeks after DC-CV - Continue OSA at night and oxygen during the day.   3. Chronic diastolic CHF: - Volume status labile. Improved today after metolazone dose yesterday. - ReDS 27%  -  Continue torsemide + prn metolazone 2.5 mg - .Reinforced fluid restriction to < 2 L daily, sodium restriction to less than 2000 mg daily, and the importance of daily weights.    4. OHS/OSA:  -Stable on home oxygen and CPAP. Per Pulmonary. No change.  5. 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   - Follows with Dr. Benay Spice. Has been stable.   6. S/p MV repair:  - Stable with mild residual MS. Echo reviewed personally today..  Total time spent 40 minutes. Over half that time spent discussing above.   Glori Bickers, MD  10/26/2016

## 2016-12-04 ENCOUNTER — Other Ambulatory Visit: Payer: Self-pay | Admitting: Emergency Medicine

## 2016-12-10 ENCOUNTER — Encounter: Payer: Self-pay | Admitting: Oncology

## 2016-12-10 NOTE — Progress Notes (Signed)
Received approval from [employer] of approval for remainder of calendar year.

## 2016-12-13 ENCOUNTER — Ambulatory Visit (INDEPENDENT_AMBULATORY_CARE_PROVIDER_SITE_OTHER): Payer: Medicare Other | Admitting: Acute Care

## 2016-12-13 ENCOUNTER — Ambulatory Visit (INDEPENDENT_AMBULATORY_CARE_PROVIDER_SITE_OTHER)
Admission: RE | Admit: 2016-12-13 | Discharge: 2016-12-13 | Disposition: A | Discharge status: Home / Self Care | Payer: Medicare Other | Source: Ambulatory Visit | Attending: Acute Care | Admitting: Acute Care

## 2016-12-13 ENCOUNTER — Encounter: Payer: Self-pay | Admitting: Acute Care

## 2016-12-13 VITALS — BP 114/70 | HR 46 | Ht 62.0 in | Wt 134.0 lb

## 2016-12-13 DIAGNOSIS — R0602 Shortness of breath: Secondary | ICD-10-CM

## 2016-12-13 NOTE — Patient Instructions (Addendum)
It is nice to meet you today. CXR today. We will call you with results Continue Breo as you have been doing. We will call with resuts Maintain your saturations 88-92%. Increase and decrease your oxygen as needed to maintain saturations 88-92% Throat soothing with sugar free jolly ranchers Small sips of water instead of throat clearing Continue Saline   And Flonaseas needed for sinus congestion Continue wearing oxygen at 2L at rest, and 3L with exertion 3L at night as needed to maintain sats 88-92% Delsym 5 mL every 12 hours as needed for cough Follow up in 3 months with Dr. Lamonte Sakai Flu shot in the fall. Please contact office for sooner follow up if symptoms do not improve or worsen or seek emergency care

## 2016-12-13 NOTE — Progress Notes (Signed)
History of Present Illness [name] is a 66 y.o. 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 ) She is followed by Dr. 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  Hereditary pyruvate kinase deficiency, chronic anemia ([name]) Secondary iron overload maintained on Exjade History of mitral regurgitation, status post mitral valve repair by Dr. Roxy [name] in February 2010   12/13/2016  2 month Follow up Asthma, Pulmonary HTN and chronic pneumonia.:  Pt. Presents for follow up. This is her 2 month follow-up of a April 2018 visit with Dr. Lamonte [name] Pt. States that she continues to notice benefit regarding dyspnea with use of her diuretic  She takes Demadex 60 mg in the morning and 40 mg in the evening. She is compliant with her Breo and Allegra.. She is states that she has been doing well with her oxygen use.she is using 2 L at rest and 3 L with exertion. She has complaints of her oxygen saturations drop at night as her oxygen is bled through he CPAP. She states at 2 L her sats drop to 85% on 2L, and on 3L it gets to 92%. She has sinus congestion, with  scant yellow secretions. She has a new cough, not constant that started about 1 week ago without secretions.She denies fever,chest pain, orthopnea or hemoptysis She is Compliant with Allegra, Flonase, and  chlorpheniramine qhs. She is also compliant with her daily protonix. She does not want to take prednisone taper for the cough, as she feels it is not bad enough.   Test Results: 12/13/2016>> CXR:  CBC Latest Ref Rng & Units 11/22/2016 10/26/2016 09/24/2016  WBC 4.0 - 10.5 K/uL 9.7 11.7(H) 9.5  Hemoglobin 12.0 - 15.0 g/dL 8.6(L) 8.8(L) 8.5(L)  Hematocrit 36.0 - 46.0 % 28.3(L) 28.9(L) 27.6(L)  Platelets 150 - 400 K/uL 515(H)  486(H) 490(H)    BMP Latest Ref Rng & Units 11/22/2016 11/05/2016 10/26/2016  Glucose 65 - 99 mg/dL 108(H) 98 106(H)  BUN 6 - 20 mg/dL 30(H) 20 26(H)  Creatinine 0.44 - 1.00 mg/dL 1.30(H) 1.14(H) 1.13(H)  Sodium 135 - 145 mmol/L 140 136 137  Potassium 3.5 - 5.1 mmol/L 3.8 3.6 2.8(L)  Chloride 101 - 111 mmol/L 98(L) 98(L) 91(L)  CO2 22 - 32 mmol/L 34(H) 30 37(H)  Calcium 8.9 - 10.3 mg/dL 9.2 9.1 9.6    BNP    Component Value Date/Time   BNP 878.0 (H) 09/11/2016 1209    ProBNP    Component Value Date/Time   PROBNP 936.3 (H) 09/21/2013 0909    PFT    Component Value Date/Time   FEV1PRE 0.96 11/01/2016 1053   FEV1POST 1.02 11/01/2016 1053   FVCPRE 1.33 11/01/2016 1053   FVCPOST 1.42 11/01/2016 1053   TLC 3.04 11/01/2016 1053   DLCOUNC 6.52 11/01/2016 1053   PREFEV1FVCRT 72 11/01/2016 1053   PSTFEV1FVCRT 71 11/01/2016 1053    No results found.   Past medical hx Past Medical History:  Diagnosis Date  . Allergic rhinitis   . Anemia   . Asthma   . Asthma   . Atrial fibrillation ([place])    s/p RFA and Maze procedure - resolved  . Atrial tachycardia ([place])   . Complication of anesthesia    trouble waking up  . Depression   . Dyspnea   . Exercise hypoxemia  supplemental O2 PRN  . Fatigue   . Gastritis    NSAID induced  . GERD (gastroesophageal reflux disease)   . Heart murmur    severe MR s/p MV repair  . Hx of asplenia    surgical  . Hypertension   . 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 Myoclonus   . Osteoporosis   . Pulmonary HTN ([place])    mild to moderate by [place] 05/2012  . Pyruvate kinase deficiency hemolytic anemia ([place])   . Syncope and collapse 2012   r/t anemia  . Trochanteric bursitis    left hip     Social History  Substance Use Topics  . Smoking status: Never Smoker  . Smokeless tobacco: Never Used  . Alcohol use Yes     Comment: socially glass red wine monthly    Tobacco Cessation: Never  smoker  Past surgical hx, Family hx, Social hx all reviewed.  Current Outpatient Prescriptions on File Prior to Visit  Medication Sig  . albuterol (PROVENTIL HFA;VENTOLIN HFA) 108 (90 Base) MCG/ACT inhaler Inhale 2 puffs into the lungs every 6 (six) hours as needed for wheezing or shortness of breath.  Marland Kitchen augmented betamethasone dipropionate (DIPROLENE-AF) 0.05 % ointment Apply 1 application topically daily as needed for rash.  . bisoprolol (ZEBETA) 5 MG tablet TAKE 1 TABLET(5 MG) BY MOUTH TWICE DAILY  . BREO ELLIPTA 100-25 MCG/INH AEPB INHALE 1 PUFF BY MOUTH EVERY DAY  . calcium-vitamin D (OSCAL WITH D) 500-200 MG-UNIT tablet Take 1 tablet by mouth 2 (two) times daily.  . chlorpheniramine (CHLOR-TRIMETON) 4 MG tablet Take 4 mg by mouth at bedtime.  . cholecalciferol (VITAMIN D) 1000 UNITS tablet Take 1,000 Units by mouth 2 (two) times daily.   . clonazePAM (KLONOPIN) 1 MG tablet Take 2 mg by mouth at bedtime.   Marland Kitchen deferasirox (EXJADE) 500 MG disintegrating tablet Take 3 tablets (1,500 mg total) by mouth daily before breakfast. (Patient taking differently: Take 1,500 mg by mouth daily before supper. )  . dextromethorphan (DELSYM) 30 MG/5ML liquid Take 60 mg by mouth at bedtime as needed for cough.  . diclofenac sodium (VOLTAREN) 1 % GEL Apply 2 g topically 4 (four) times daily as needed. (Patient taking differently: Apply 2 g topically 4 (four) times daily as needed (for pain). )  . diltiazem (CARTIA XT) 240 MG 24 hr capsule Take 1 capsule (240 mg total) by mouth daily with breakfast.  . ELIQUIS 5 MG TABS tablet TAKE 1 TABLET BY MOUTH TWICE A DAY  . EPINEPHrine (EPI-PEN) 0.3 mg/0.3 mL DEVI Inject 0.3 mg into the muscle once as needed (for allergic reaction).   Marland Kitchen escitalopram (LEXAPRO) 10 MG tablet Take 10 mg by mouth at bedtime.   . fexofenadine (ALLEGRA) 180 MG tablet Take 180 mg by mouth daily.   . fluticasone (FLONASE) 50 MCG/ACT nasal spray Place 1 spray into both nostrils daily.  . folic  acid (FOLVITE) 750 MCG tablet Take 400 mcg by mouth daily.  . Lactobacillus Rhamnosus, GG, (CULTURELLE) CAPS Take 1 capsule by mouth daily.  . metolazone (ZAROXOLYN) 2.5 MG tablet Take 1 tablet (2.5 mg total) by mouth as needed. (Patient taking differently: Take 2.5 mg by mouth as needed (swelling). If weight gain of 5lbs in 1 week or 3 lbs over night.  No more than 2.5mg s per week)  . Multiple Vitamins-Minerals (AIRBORNE) TBEF Take 1 tablet by mouth 2 (two) times daily. *Dissolves in water twice a  day*  . OXYGEN Inhale 3 L into the lungs continuous.   . pantoprazole (PROTONIX) 40 MG tablet Take 40 mg by mouth daily before breakfast.   . potassium chloride SA (KLOR-CON M20) 20 MEQ tablet Take 3 tabs in AM and 2 tabs in PM  . raloxifene (EVISTA) 60 MG tablet Take 60 mg by mouth daily with breakfast.   . senna-docusate (SENOKOT-S) 8.6-50 MG tablet Take 1 tablet by mouth at bedtime as needed for mild constipation.  . sodium chloride (OCEAN) 0.65 % SOLN nasal spray Place 1 spray into both nostrils 2 (two) times daily.   Marland Kitchen spironolactone (ALDACTONE) 25 MG tablet TAKE 1/2 TABLET(12.5 MG) BY MOUTH DAILY  . torsemide (DEMADEX) 20 MG tablet Take 2 tablets (40 mg total) by mouth 2 (two) times daily.  . traMADol (ULTRAM) 50 MG tablet Take 50-100 mg by mouth 3 (three) times daily. Take 50 mg in the morning, 50mg s in the afternoon, and 100 mgs at bedtime   No current facility-administered medications on file prior to visit.      Allergies  Allergen Reactions  . Iodine Anaphylaxis  . Shellfish Allergy Anaphylaxis  . Iohexol      Code: HIVES, Desc: reaction since childhood, Onset Date: [id number]   . Levaquin [Levofloxacin In D5w] Nausea And Vomiting    dizziness    Review Of Systems:  Constitutional:   No  weight loss, night sweats,  Fevers, chills, fatigue, or  lassitude.  HEENT:   No headaches,  Difficulty swallowing,  Tooth/dental problems, or  Sore throat,                No sneezing, itching,  ear ache,+ nasal congestion,+ post nasal drip, hoarse voice  CV:  No chest pain,  Orthopnea, PND, swelling in lower extremities, anasarca, dizziness, palpitations, syncope.   GI  No heartburn, indigestion, abdominal pain, nausea, vomiting, diarrhea, change in bowel habits, loss of appetite, bloody stools.   Resp: + shortness of breath with exertion less at rest.  + excess mucus, + productive cough,  No non-productive cough,  No coughing up of blood.  No change in color of mucus.  No wheezing.  No chest wall deformity  Skin: no rash or lesions.  GU: no dysuria, change in color of urine, no urgency or frequency.  No flank pain, no hematuria   MS:  No joint pain or swelling.  No decreased range of motion.  No back pain.  Psych:  No change in mood or affect. No depression or anxiety.  No memory loss.   Vital Signs BP 114/70 (BP Location: Left Arm, Patient Position: Sitting, Cuff Size: Normal)   Pulse (!) 46   Ht 5\' 2"  (1.575 m)   Wt 134 lb (60.8 kg)   SpO2 96%   BMI 24.51 kg/m    Physical Exam:  General- No distress,  A&Ox3, pleasant ENT: No sinus tenderness, TM clear, pale nasal mucosa, no oral exudate,no post nasal drip, no LAN Cardiac: S1, S2, regular rate and rhythm, no murmur Chest: No wheeze/ rales/ dullness; no accessory muscle use, no nasal flaring, no sternal retractions Abd.: Soft Non-tender, nondistended, bowel sounds positive, obese Ext: No clubbing cyanosis, edema Neuro:  Deconditioned at baseline, uses walker Skin: No rashes, warm and dry Psych: normal mood and behavior   Assessment/Plan  No problem-specific Assessment & Plan notes found for this encounter.  OSA Stable on current CPAP settings Continue on CPAP at bedtime. You appear to be benefiting from the treatment  Goal is to wear for at least 4-6 hours each night for maximal clinical benefit. Continue to work on weight loss, as the link between excess weight  and sleep apnea is well established.  Do  not drive if sleepy. Okay to wear CPAP with 3 L of oxygen to maintain saturations between 88-92 percent.  Pulmonary hypertension Multifactorial, stable with increase in diuretic Currently on Demadex 60 mg a.m., 40 mg p.m. Continue to aggressively treat underlying conditions  Chronic respiratory failure Oxygen saturation goals are 88-92 percent Continue wearing oxygen at 2 L at rest Continue wearing oxygen at 3 L nasal cannula with exertion  Intrinsic asthma Continue Breo1 puff once daily as you have been doing Rescue inhaler as needed for breakthrough shortness of breath Continue Allegra daily  Allergic rhinitis Continue Flonase twice daily,  continue Allegra daily,  continue chlorpheniramine at daily at bedtime,  consider saline rinses daily,  Cough Chest x-ray Delsym 5 mL every 12 hours for cough We will call you with results Continue treatment for allergic rhinitis as you have been doing Let us know if you feel you need a prednisone taper  Follow up with Dr. Lamonte [name] in 3 months Flu shot without fail and the fall  Magdalen Spatz, NP 12/13/2016  12:43 PM

## 2016-12-17 ENCOUNTER — Telehealth: Payer: Self-pay | Admitting: *Deleted

## 2016-12-17 NOTE — Telephone Encounter (Signed)
Received fax from [place]. Pt has been approved for assistance for the remainder of this calendar year.

## 2016-12-24 ENCOUNTER — Other Ambulatory Visit: Payer: Medicare Other

## 2016-12-28 ENCOUNTER — Inpatient Hospital Stay (HOSPITAL_COMMUNITY): Admission: RE | Admit: 2016-12-28 | Payer: Medicare Other | Source: Ambulatory Visit | Admitting: Internal Medicine

## 2017-01-04 ENCOUNTER — Telehealth: Payer: Self-pay | Admitting: *Deleted

## 2017-01-04 DIAGNOSIS — D594 Other nonautoimmune hemolytic anemias: Secondary | ICD-10-CM

## 2017-01-04 MED ORDER — TRAMADOL HCL 50 MG PO TABS: 50.0000 mg | ORAL_TABLET | Freq: Three times a day (TID) | ORAL | 0 refills | Status: DC

## 2017-01-04 NOTE — Addendum Note (Signed)
Addended by: Rosalio Macadamia C on: 01/04/2017 11:19 AM   Modules accepted: Orders

## 2017-01-04 NOTE — Telephone Encounter (Signed)
Call from pt requesting Tramadol refill for her bone pain. Medication is usually filled by Dr. Inda Merlin but he is out of town. Pt was given a 90 day supply (#360 tablets) that should have lasted through the end of this month. She was recently instructed by PCP to take up to 5 per day (originally written for 4 per day) due to new back pain. She is now out of Tramadol. Pt reports Dr. Inda Merlin' nurse recommended she call one of her other doctors since he is out of town. Script will need to include "OK to fill early"] Discussed with Dr. Benay Spice: OK to refill for one month supply.

## 2017-01-04 NOTE — Telephone Encounter (Addendum)
Script called to [facility] initially, pt requested it be sent to [facility]. Same done. Canceled [place].

## 2017-01-12 ENCOUNTER — Other Ambulatory Visit: Payer: Self-pay | Admitting: Cardiology

## 2017-01-14 ENCOUNTER — Other Ambulatory Visit (HOSPITAL_COMMUNITY): Payer: Self-pay | Admitting: Student

## 2017-01-22 ENCOUNTER — Ambulatory Visit (HOSPITAL_COMMUNITY)
Admission: RE | Admit: 2017-01-22 | Discharge: 2017-01-22 | Disposition: A | Discharge status: Home / Self Care | Payer: Medicare Other | Source: Ambulatory Visit | Attending: Internal Medicine | Admitting: Internal Medicine

## 2017-01-22 ENCOUNTER — Encounter (HOSPITAL_COMMUNITY): Payer: Self-pay | Admitting: Internal Medicine

## 2017-01-22 VITALS — BP 130/81 | HR 93 | Wt 163.1 lb

## 2017-01-22 DIAGNOSIS — J45909 Unspecified asthma, uncomplicated: Secondary | ICD-10-CM | POA: Insufficient documentation

## 2017-01-22 DIAGNOSIS — I272 Pulmonary hypertension, unspecified: Secondary | ICD-10-CM | POA: Diagnosis not present

## 2017-01-22 DIAGNOSIS — G4733 Obstructive sleep apnea (adult) (pediatric): Secondary | ICD-10-CM | POA: Diagnosis not present

## 2017-01-22 DIAGNOSIS — D649 Anemia, unspecified: Secondary | ICD-10-CM | POA: Diagnosis not present

## 2017-01-22 DIAGNOSIS — I110 Hypertensive heart disease with heart failure: Secondary | ICD-10-CM | POA: Diagnosis not present

## 2017-01-22 DIAGNOSIS — Z9981 Dependence on supplemental oxygen: Secondary | ICD-10-CM | POA: Insufficient documentation

## 2017-01-22 DIAGNOSIS — I2721 Secondary pulmonary arterial hypertension: Secondary | ICD-10-CM | POA: Insufficient documentation

## 2017-01-22 DIAGNOSIS — I480 Paroxysmal atrial fibrillation: Secondary | ICD-10-CM | POA: Insufficient documentation

## 2017-01-22 DIAGNOSIS — E669 Obesity, unspecified: Secondary | ICD-10-CM | POA: Insufficient documentation

## 2017-01-22 DIAGNOSIS — Z79899 Other long term (current) drug therapy: Secondary | ICD-10-CM | POA: Diagnosis not present

## 2017-01-22 DIAGNOSIS — I5032 Chronic diastolic (congestive) heart failure: Secondary | ICD-10-CM | POA: Diagnosis not present

## 2017-01-22 DIAGNOSIS — J9611 Chronic respiratory failure with hypoxia: Secondary | ICD-10-CM

## 2017-01-22 DIAGNOSIS — Z7901 Long term (current) use of anticoagulants: Secondary | ICD-10-CM | POA: Insufficient documentation

## 2017-01-22 DIAGNOSIS — Z6829 Body mass index (BMI) 29.0-29.9, adult: Secondary | ICD-10-CM | POA: Insufficient documentation

## 2017-01-22 LAB — BASIC METABOLIC PANEL
ANION GAP: 6 (ref 5–15)
BUN: 19 mg/dL (ref 6–20)
CALCIUM: 9.3 mg/dL (ref 8.9–10.3)
CO2: 34 mmol/L — AB (ref 22–32)
Chloride: 98 mmol/L — ABNORMAL LOW (ref 101–111)
Creatinine, Ser: 0.98 mg/dL (ref 0.44–1.00)
GFR calc Af Amer: >60 mL/min (ref >60)
GFR, EST NON AFRICAN AMERICAN: 58 mL/min — AB (ref >60)
GLUCOSE: 102 mg/dL — AB (ref 65–99)
POTASSIUM: 3.4 mmol/L — AB (ref 3.5–5.1)
Sodium: 138 mmol/L (ref 135–145)

## 2017-01-22 NOTE — Progress Notes (Signed)
Advanced Heart Failure Clinic Note   PCP: Dr Inda Merlin  HF: Dr. Haroldine Laws  Pulmonary: Dr Lamonte Sakai   HPI: [name] is a 66 y/o woman with h/o HTN, pyruvate kinase deficiency with hemolytic anemia, hemochromatosis, obesity, asthma, OSA (on CPAP), mitral valve prolapse s/p mini MV repair/Maze (2011 with Dr. [name]), PAF s/p RFA followed by surgical Maze 7829 and diastolic dysfunction.  Pre-op cath with normal coronaries in 2011. 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   Baseline hgb 7-9. Doesn't get transfused unless 6 or below.  Today she returns for HF follow. Since the last visit she had successful cardioversion. Weight athome 156-162 pounds. Today she took a metolazone due to weight gain. Usually taking metolazone weeklyl. Had take out last night with high salt meal. SOB Exertion. Denies syncope/presyncope.  Remain on 3 liters oxygen. Taking all medications. No BRBPR    RHC 06/24/12 RA = 10  RV = 60/7/12  PA = 58/25 (40)  PCW = 19 (no significant v-waves)  Fick cardiac output/index = 6.6/3.7  PVR = 3.1 [place]  FA sat = 91%  PA sat = 66%, 67% PVR was not significantly elevated so selective pulmonary vasodilators were noted started.   RHC 6/16 RA = 17 PA = 74/31 (46) PCWP = 27 CI 3.89 PVR 2.8 WU  RHC 01/22/2017 RA = 10 RV = 74/14 PA = 74/27 (44) PCW = 23 (v waves to 40) Fick cardiac output/index = 6.1/3.8 PVR = 3.4 WU Ao sat = 98% PA sat = 68%, 69%  Echo 12/13 EF 60-65% mild LVH. Grade 2 DD. MVR stable (no MS noted). Moderate LAE. RV mildly dilated. Mod-severe TR with estimated RVSP 80-90 range (in 2011 was 40-45 mm HG) ECHO 10/22/12 EF 55% RV mildly decreased + Septal bounce. Moderate TR. RVSP 65-70. +diastolic dysfunction (likely restrictive - but tissue Doppler not performed).  ECHO 05/17/2014: EF 50-55%  TEE (4/15): EF 60-65%, severe bilateral atrial enlargement, s/p MV ring with no MR and mild mitral stenosis with meean gradient 6, MVA 1.7 cm^2.   PASP on TTE in 4/15 was 60 mmHg.  Echo (6/16): EF 50-55%, moderate diastolic dysfunction, D-shaped interventricular septum, moderately dilated RV with mildly decreased systolic function, PA systolic pressure 60 mmHg, Echo 11/17 EF 55-60% RV dilated. D-shaped septum RVSP 68mmHG C not dilated, s/p MV repair with mild MS and trivial MR.    PFTs 2/14 FEV1 1.20L         FVC    1.68L         DLCO 36%   ROS: All systems negative except as listed in HPI, PMH and Problem List.  Past Medical History:  Diagnosis Date  . Allergic rhinitis   . Anemia   . Asthma   . Asthma   . Atrial fibrillation ([place])    s/p RFA and Maze procedure - resolved  . Atrial tachycardia ([place])   . Complication of anesthesia    trouble waking up  . Depression   . Dyspnea   . Exercise hypoxemia    supplemental O2 PRN  . Fatigue   . Gastritis    NSAID induced  . GERD (gastroesophageal reflux disease)   . Heart murmur    severe MR s/p MV repair  . Hx of asplenia    surgical  . Hypertension   . 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 Myoclonus   . Osteoporosis   .  Pulmonary HTN ([place])    mild to moderate by [place] 05/2012  . Pyruvate kinase deficiency hemolytic anemia ([place])   . Syncope and collapse 2012   r/t anemia  . Trochanteric bursitis    left hip    Current Outpatient Prescriptions  Medication Sig Dispense Refill  . albuterol (PROVENTIL HFA;VENTOLIN HFA) 108 (90 Base) MCG/ACT inhaler Inhale 2 puffs into the lungs every 6 (six) hours as needed for wheezing or shortness of breath. 1 Inhaler 2  . augmented betamethasone dipropionate (DIPROLENE-AF) 0.05 % ointment Apply 1 application topically daily as needed for rash.    . bisoprolol (ZEBETA) 5 MG tablet TAKE 1 TABLET(5 MG) BY MOUTH TWICE DAILY 180 tablet 3  . BREO ELLIPTA 100-25 MCG/INH AEPB INHALE 1 PUFF BY MOUTH EVERY DAY 60 each 5  . calcium-vitamin D (OSCAL WITH D) 500-200 MG-UNIT tablet Take 1 tablet by mouth  2 (two) times daily.    . chlorpheniramine (CHLOR-TRIMETON) 4 MG tablet Take 4 mg by mouth at bedtime.    . cholecalciferol (VITAMIN D) 1000 UNITS tablet Take 1,000 Units by mouth 2 (two) times daily.     . clonazePAM (KLONOPIN) 1 MG tablet Take 2 mg by mouth at bedtime.     Marland Kitchen deferasirox (EXJADE) 500 MG disintegrating tablet Take 3 tablets (1,500 mg total) by mouth daily before breakfast. (Patient taking differently: Take 1,500 mg by mouth daily before supper. ) 90 tablet 1  . dextromethorphan (DELSYM) 30 MG/5ML liquid Take 60 mg by mouth at bedtime as needed for cough.    . diclofenac sodium (VOLTAREN) 1 % GEL Apply 2 g topically 4 (four) times daily as needed. (Patient taking differently: Apply 2 g topically 4 (four) times daily as needed (for pain). ) 100 g 0  . diltiazem (CARTIA XT) 240 MG 24 hr capsule Take 1 capsule (240 mg total) by mouth daily with breakfast. 90 capsule 3  . ELIQUIS 5 MG TABS tablet TAKE 1 TABLET BY MOUTH TWICE A DAY 60 tablet 5  . EPINEPHrine (EPI-PEN) 0.3 mg/0.3 mL DEVI Inject 0.3 mg into the muscle once as needed (for allergic reaction).     Marland Kitchen escitalopram (LEXAPRO) 10 MG tablet Take 10 mg by mouth at bedtime.     . fexofenadine (ALLEGRA) 180 MG tablet Take 180 mg by mouth daily.     . fluticasone (FLONASE) 50 MCG/ACT nasal spray Place 1 spray into both nostrils daily.    . folic acid (FOLVITE) 867 MCG tablet Take 400 mcg by mouth daily.    . Lactobacillus Rhamnosus, GG, (CULTURELLE) CAPS Take 1 capsule by mouth daily.    . metolazone (ZAROXOLYN) 2.5 MG tablet Take 1 tablet (2.5 mg total) by mouth as needed. (Patient taking differently: Take 2.5 mg by mouth as needed (swelling). If weight gain of 5lbs in 1 week or 3 lbs over night.  No more than 2.5mg s per week) 10 tablet 3  . Multiple Vitamins-Minerals (AIRBORNE) TBEF Take 1 tablet by mouth 2 (two) times daily. *Dissolves in water twice a day*    . OXYGEN Inhale 3 L into the lungs continuous.     . pantoprazole  (PROTONIX) 40 MG tablet Take 40 mg by mouth daily before breakfast.     . potassium chloride SA (KLOR-CON M20) 20 MEQ tablet Take 3 tabs in AM and 2 tabs in PM 150 tablet 3  . raloxifene (EVISTA) 60 MG tablet Take 60 mg by mouth daily with breakfast.     . senna-docusate (  SENOKOT-S) 8.6-50 MG tablet Take 1 tablet by mouth at bedtime as needed for mild constipation. 30 tablet 0  . sodium chloride (OCEAN) 0.65 % SOLN nasal spray Place 1 spray into both nostrils 2 (two) times daily.     Marland Kitchen spironolactone (ALDACTONE) 25 MG tablet TAKE ONE-HALF (12.5 MG) TABLET BY MOUTH DAILY 45 tablet 3  . torsemide (DEMADEX) 20 MG tablet TAKE 2 TABLETS (40 MG TOTAL) BY MOUTH 2 (TWO) TIMES DAILY. 120 tablet 6  . traMADol (ULTRAM) 50 MG tablet Take 1-2 tablets (50-100 mg total) by mouth 3 (three) times daily. Take 100 mg in the morning, 50mg s in the afternoon, and 100 mgs at bedtime 150 tablet 0   No current facility-administered medications for this encounter.    Social History  Substance Use Topics  . Smoking status: Never Smoker  . Smokeless tobacco: Never Used  . Alcohol use Yes     Comment: socially glass red wine monthly   Family History  Problem Relation Age of Onset  . Breast cancer Mother 68       ER+  . Melanoma Father 73  . Adrenal disorder Sister        pheochromacytoma - rt. adrenal gland  . COPD Maternal Aunt   . Stomach cancer Paternal Uncle        onset in 45s  . Lung cancer Maternal Grandmother        died late 70s, heavy smoker  . Heart disease Maternal Grandfather        hardening of the arteries, poor circulation  . Ovarian cancer Sister 62       stage IV, now 50    There were no vitals filed for this visit. Wt Readings from Last 3 Encounters:  01/22/17 163 lb 1.9 oz (74 kg)  12/13/16 134 lb (60.8 kg)  11/22/16 134 lb 14.7 oz (61.2 kg)   Reds Vest 28%.   PHYSICAL EXAM: General:  Well appearing. No resp difficulty HEENT: normal Neck: supple. JVP ~10  Carotids 2+ bilat; no  bruits. No lymphadenopathy or thryomegaly appreciated. Cor: PMI nondisplaced. Regular rate & rhythm. No rubs, gallops or murmurs. Lungs: clear on 3 liters oxygen.  Abdomen: soft, nontender, nondistended. No hepatosplenomegaly. No bruits or masses. Good bowel sounds. Extremities: no cyanosis, clubbing, rash, edema Neuro: alert & orientedx3, cranial nerves grossly intact. moves all 4 extremities w/o difficulty. Affect pleasant   ECG: NSR 73 bpm    ASSESSMENT & PLAN:  1. Atrial fibrillation, paroxysmal:  - -Maintaining NSR. Continue Eliquis - Continue bisoprolol 5 mg BID and diltiazem CD 240 mg daily 2. Pulmonary arterial hypertension: PVR was only 2.8 WU on 6/16 RHC. The PAH appears to be primarily Group 3 in the setting of OSA and chronic respiratory failure (on home oxygen, ?OHS).  Echo 6/16 showed that the RV is moderately dilated with mildly decreased systolic function. ECHO 03/2016 EF 55-60% Peak PA pressure 86 mm hg.   - NYHA IIIb.  On 3 liters oxygen.  - Continue OSA at night and oxygen during the day.   3. Chronic diastolic CHF: - Volume status stable. Reds Vest 28%. . Already had metolazone today.  - Continue torsemide + prn metolazone 2.5 mg - .Reinforced fluid restriction to < 2 L daily, sodium restriction to less than 2000 mg daily, and the importance of daily weights.   Discussed limting high salt food.   4. OHS/OSA:  -Continue nightly CPAP.   5. Anemia: h/o pyruvate kinase deficiency with hemolytic  anemia.   - Follows with Dr. Benay Spice. Has been stable.   6. S/p MV repair:  - Stable with mild residual MS.    BMET today.   Follow up 2-3  months   Amanda Grinder, NP  01/22/2017   Patient seen and examined with Amanda Grinder, NP. We discussed all aspects of the encounter. I agree with the assessment and plan as stated above.   Recent RHC data reviewed with her. On exam appears mildly volume overload but ReDS reading ok. Weight up 5 pounds. Discussed use of prn metolazone to  keep weight 155-159. Reinforced need for daily weights and reviewed use of sliding scale diuretics.  Remains in NSR after recent DC-CV. Continue Eliquis.   Amanda Bickers, MD  12:00 AM

## 2017-01-22 NOTE — Patient Instructions (Signed)
Follow up with Dr. Bensimhon in 2 months 

## 2017-01-24 ENCOUNTER — Telehealth (HOSPITAL_COMMUNITY): Payer: Self-pay | Admitting: *Deleted

## 2017-01-24 DIAGNOSIS — I5032 Chronic diastolic (congestive) heart failure: Secondary | ICD-10-CM

## 2017-01-24 MED ORDER — POTASSIUM CHLORIDE CRYS ER 20 MEQ PO TBCR: 60.0000 meq | EXTENDED_RELEASE_TABLET | Freq: Two times a day (BID) | ORAL | 3 refills | Status: DC

## 2017-01-24 NOTE — Telephone Encounter (Signed)
Notes recorded by Scarlette Calico, RN on 01/24/2017 at 3:58 PM EDT Pt aware and agreeable, new rx sent in, labs sch for 9/6 ------  Notes recorded by Scarlette Calico, RN on 01/24/2017 at 11:01 AM EDT Left message to call back ------  Notes recorded by Jolaine Artist, MD on 01/23/2017 at 10:58 AM EDT Increase kcl by 20 meq daily. Repeat 1 week.

## 2017-01-31 ENCOUNTER — Ambulatory Visit (HOSPITAL_COMMUNITY)
Admission: RE | Admit: 2017-01-31 | Discharge: 2017-01-31 | Disposition: A | Discharge status: Home / Self Care | Payer: Medicare Other | Source: Ambulatory Visit | Attending: Internal Medicine | Admitting: Internal Medicine

## 2017-01-31 DIAGNOSIS — I5032 Chronic diastolic (congestive) heart failure: Secondary | ICD-10-CM | POA: Diagnosis not present

## 2017-01-31 LAB — BASIC METABOLIC PANEL
ANION GAP: 13 (ref 5–15)
BUN: 31 mg/dL — AB (ref 6–20)
CALCIUM: 9.2 mg/dL (ref 8.9–10.3)
CHLORIDE: 89 mmol/L — AB (ref 101–111)
CO2: 32 mmol/L (ref 22–32)
Creatinine, Ser: 1.33 mg/dL — ABNORMAL HIGH (ref 0.44–1.00)
GFR calc non Af Amer: 40 mL/min — ABNORMAL LOW (ref >60)
GFR, EST AFRICAN AMERICAN: 46 mL/min — AB (ref >60)
Glucose, Bld: 151 mg/dL — ABNORMAL HIGH (ref 65–99)
Potassium: 2.9 mmol/L — ABNORMAL LOW (ref 3.5–5.1)
SODIUM: 134 mmol/L — AB (ref 135–145)

## 2017-02-04 ENCOUNTER — Telehealth (HOSPITAL_COMMUNITY): Payer: Self-pay | Admitting: *Deleted

## 2017-02-04 DIAGNOSIS — E876 Hypokalemia: Secondary | ICD-10-CM

## 2017-02-04 NOTE — Telephone Encounter (Signed)
Notes recorded by Scarlette Calico, RN on 02/04/2017 at 12:18 PM EDT Per Dr Haroldine Laws have pt take extra 40 meq today and repeat bmet tomorrow, pt is aware and agreeable w/plan ------  Notes recorded by Scarlette Calico, RN on 02/04/2017 at 9:40 AM EDT Spoke w/pt, her KCL was increased to 60 meq BID on 8/28, she states she has been taking this dose as well as Spiro 12.5 mg daily and Torsemide 40 mg BID. She states she has not missed any doses of KCL. Will discuss w/Dr Bensimhon and call her back. ------  Notes recorded by Jolaine Artist, MD on 02/03/2017 at 8:58 PM EDT Did anyone call to replace her K? If not lets do this Monday am ASAP.

## 2017-02-04 NOTE — Telephone Encounter (Signed)
-----   Message from Jolaine Artist, MD sent at 02/03/2017  8:58 PM EDT ----- Did anyone call to replace her K? If not lets do this Monday am ASAP.

## 2017-02-05 ENCOUNTER — Telehealth (HOSPITAL_COMMUNITY): Payer: Self-pay | Admitting: *Deleted

## 2017-02-05 ENCOUNTER — Ambulatory Visit (HOSPITAL_COMMUNITY)
Admission: RE | Admit: 2017-02-05 | Discharge: 2017-02-05 | Disposition: A | Discharge status: Home / Self Care | Payer: Medicare Other | Source: Ambulatory Visit | Attending: Cardiology | Admitting: Cardiology

## 2017-02-05 DIAGNOSIS — I5022 Chronic systolic (congestive) heart failure: Secondary | ICD-10-CM

## 2017-02-05 DIAGNOSIS — E876 Hypokalemia: Secondary | ICD-10-CM | POA: Insufficient documentation

## 2017-02-05 LAB — BASIC METABOLIC PANEL
ANION GAP: 11 (ref 5–15)
BUN: 35 mg/dL — AB (ref 6–20)
CALCIUM: 9.4 mg/dL (ref 8.9–10.3)
CO2: 36 mmol/L — AB (ref 22–32)
CREATININE: 1.49 mg/dL — AB (ref 0.44–1.00)
Chloride: 88 mmol/L — ABNORMAL LOW (ref 101–111)
GFR calc Af Amer: 40 mL/min — ABNORMAL LOW (ref >60)
GFR calc non Af Amer: 35 mL/min — ABNORMAL LOW (ref >60)
GLUCOSE: 94 mg/dL (ref 65–99)
Potassium: 2.6 mmol/L — CL (ref 3.5–5.1)
Sodium: 135 mmol/L (ref 135–145)

## 2017-02-05 NOTE — Telephone Encounter (Signed)
Critical Lab results K: 2.6  Per Dr.Bensimhon take 133meq today. Increase to 80bid for three days then back to 60 bid. Recheck labs Friday. No answer left VM for pt to call for lab results

## 2017-02-06 MED ORDER — SPIRONOLACTONE 25 MG PO TABS: 25.0000 mg | ORAL_TABLET | Freq: Every day | ORAL | 3 refills | Status: DC

## 2017-02-06 NOTE — Telephone Encounter (Signed)
Pt called back pt is taking 60-40-60 of K not 60 bid. Dr.Bensimhon wants pt to start 25mg  of spiro and come for labs tomorrow. Pt aware and agreeable.

## 2017-02-06 NOTE — Addendum Note (Signed)
Addended by: Harvie Junior on: 02/06/2017 12:53 PM   Modules accepted: Orders

## 2017-02-07 ENCOUNTER — Ambulatory Visit (HOSPITAL_COMMUNITY)
Admission: RE | Admit: 2017-02-07 | Discharge: 2017-02-07 | Disposition: A | Discharge status: Home / Self Care | Payer: Medicare Other | Source: Ambulatory Visit | Attending: Internal Medicine | Admitting: Internal Medicine

## 2017-02-07 DIAGNOSIS — I5022 Chronic systolic (congestive) heart failure: Secondary | ICD-10-CM | POA: Insufficient documentation

## 2017-02-07 LAB — BASIC METABOLIC PANEL
Anion gap: 8 (ref 5–15)
BUN: 37 mg/dL — AB (ref 6–20)
CALCIUM: 9.6 mg/dL (ref 8.9–10.3)
CO2: 34 mmol/L — ABNORMAL HIGH (ref 22–32)
Chloride: 91 mmol/L — ABNORMAL LOW (ref 101–111)
Creatinine, Ser: 1.26 mg/dL — ABNORMAL HIGH (ref 0.44–1.00)
GFR calc Af Amer: 50 mL/min — ABNORMAL LOW (ref >60)
GFR, EST NON AFRICAN AMERICAN: 43 mL/min — AB (ref >60)
GLUCOSE: 104 mg/dL — AB (ref 65–99)
POTASSIUM: 3 mmol/L — AB (ref 3.5–5.1)
SODIUM: 133 mmol/L — AB (ref 135–145)

## 2017-02-13 ENCOUNTER — Telehealth (HOSPITAL_COMMUNITY): Payer: Self-pay | Admitting: *Deleted

## 2017-02-13 DIAGNOSIS — I272 Pulmonary hypertension, unspecified: Secondary | ICD-10-CM

## 2017-02-13 MED ORDER — POTASSIUM CHLORIDE CRYS ER 20 MEQ PO TBCR: 60.0000 meq | EXTENDED_RELEASE_TABLET | Freq: Three times a day (TID) | ORAL | 3 refills | Status: DC

## 2017-02-13 NOTE — Telephone Encounter (Signed)
Advanced Heart Failure Triage Encounter  Patient Name: [name]  Date of Call: 02/13/17  Problem: wt gain/fatigue  Patient called regarding last week lab results and also that she had gained 4 lbs overnight.  She last took metolazone on the 16th where she lost 4 lbs but weight is back up at 158.9 from 154.9 yesterday.  She also stated she has been feeling really tired and feels she may have a fever but when she checks her temp she is normal.    Plan:  Spoke with Barrington Ellison, PA and he advises patient to increase daily potassium to 60 mEq TID.  Also take 2.5 mg of metolazone today with extra 40 of potassium  Wants patient to come in for bmet next week.  Patient understands and agrees with plan.  Appointment scheduled, bmet ordered, and medications updated.  No further questions.   Darron Doom, RN

## 2017-02-18 DIAGNOSIS — H2513 Age-related nuclear cataract, bilateral: Secondary | ICD-10-CM | POA: Diagnosis not present

## 2017-02-18 DIAGNOSIS — H524 Presbyopia: Secondary | ICD-10-CM | POA: Diagnosis not present

## 2017-02-18 DIAGNOSIS — H43813 Vitreous degeneration, bilateral: Secondary | ICD-10-CM | POA: Diagnosis not present

## 2017-02-18 DIAGNOSIS — H5211 Myopia, right eye: Secondary | ICD-10-CM | POA: Diagnosis not present

## 2017-02-18 DIAGNOSIS — H5202 Hypermetropia, left eye: Secondary | ICD-10-CM | POA: Diagnosis not present

## 2017-02-18 DIAGNOSIS — H52223 Regular astigmatism, bilateral: Secondary | ICD-10-CM | POA: Diagnosis not present

## 2017-02-19 ENCOUNTER — Ambulatory Visit (HOSPITAL_COMMUNITY)
Admission: RE | Admit: 2017-02-19 | Discharge: 2017-02-19 | Disposition: A | Discharge status: Home / Self Care | Payer: Medicare Other | Source: Ambulatory Visit | Attending: Cardiology | Admitting: Cardiology

## 2017-02-19 DIAGNOSIS — I272 Pulmonary hypertension, unspecified: Secondary | ICD-10-CM | POA: Diagnosis not present

## 2017-02-19 LAB — BASIC METABOLIC PANEL
Anion gap: 4 — ABNORMAL LOW (ref 5–15)
BUN: 28 mg/dL — ABNORMAL HIGH (ref 6–20)
CALCIUM: 9.3 mg/dL (ref 8.9–10.3)
CO2: 29 mmol/L (ref 22–32)
CREATININE: 1.09 mg/dL — AB (ref 0.44–1.00)
Chloride: 101 mmol/L (ref 101–111)
GFR calc Af Amer: 59 mL/min — ABNORMAL LOW (ref >60)
GFR calc non Af Amer: 51 mL/min — ABNORMAL LOW (ref >60)
GLUCOSE: 106 mg/dL — AB (ref 65–99)
Potassium: 4.5 mmol/L (ref 3.5–5.1)
Sodium: 134 mmol/L — ABNORMAL LOW (ref 135–145)

## 2017-02-20 ENCOUNTER — Other Ambulatory Visit (HOSPITAL_COMMUNITY): Payer: Medicare Other

## 2017-02-27 ENCOUNTER — Other Ambulatory Visit: Payer: Self-pay | Admitting: *Deleted

## 2017-02-27 DIAGNOSIS — L570 Actinic keratosis: Secondary | ICD-10-CM | POA: Diagnosis not present

## 2017-02-27 DIAGNOSIS — D692 Other nonthrombocytopenic purpura: Secondary | ICD-10-CM | POA: Diagnosis not present

## 2017-02-27 DIAGNOSIS — L821 Other seborrheic keratosis: Secondary | ICD-10-CM | POA: Diagnosis not present

## 2017-02-27 DIAGNOSIS — D225 Melanocytic nevi of trunk: Secondary | ICD-10-CM | POA: Diagnosis not present

## 2017-02-27 DIAGNOSIS — D594 Other nonautoimmune hemolytic anemias: Secondary | ICD-10-CM

## 2017-02-27 DIAGNOSIS — D485 Neoplasm of uncertain behavior of skin: Secondary | ICD-10-CM | POA: Diagnosis not present

## 2017-02-27 DIAGNOSIS — D1801 Hemangioma of skin and subcutaneous tissue: Secondary | ICD-10-CM | POA: Diagnosis not present

## 2017-02-27 MED ORDER — DEFERASIROX 500 MG PO TBSO: 1500.0000 mg | ORAL_TABLET | Freq: Every day | ORAL | 3 refills | Status: AC

## 2017-03-02 ENCOUNTER — Other Ambulatory Visit (HOSPITAL_COMMUNITY): Payer: Self-pay | Admitting: Internal Medicine

## 2017-03-12 ENCOUNTER — Ambulatory Visit (INDEPENDENT_AMBULATORY_CARE_PROVIDER_SITE_OTHER): Payer: Medicare Other | Admitting: Emergency Medicine

## 2017-03-12 ENCOUNTER — Telehealth: Payer: Self-pay | Admitting: Emergency Medicine

## 2017-03-12 ENCOUNTER — Encounter: Payer: Self-pay | Admitting: Emergency Medicine

## 2017-03-12 DIAGNOSIS — G4733 Obstructive sleep apnea (adult) (pediatric): Secondary | ICD-10-CM

## 2017-03-12 DIAGNOSIS — I119 Hypertensive heart disease without heart failure: Secondary | ICD-10-CM | POA: Diagnosis not present

## 2017-03-12 DIAGNOSIS — J45909 Unspecified asthma, uncomplicated: Secondary | ICD-10-CM | POA: Diagnosis not present

## 2017-03-12 DIAGNOSIS — M810 Age-related osteoporosis without current pathological fracture: Secondary | ICD-10-CM | POA: Diagnosis not present

## 2017-03-12 DIAGNOSIS — N182 Chronic kidney disease, stage 2 (mild): Secondary | ICD-10-CM | POA: Diagnosis not present

## 2017-03-12 DIAGNOSIS — J209 Acute bronchitis, unspecified: Secondary | ICD-10-CM

## 2017-03-12 DIAGNOSIS — I10 Essential (primary) hypertension: Secondary | ICD-10-CM | POA: Diagnosis not present

## 2017-03-12 DIAGNOSIS — J301 Allergic rhinitis due to pollen: Secondary | ICD-10-CM

## 2017-03-12 DIAGNOSIS — I481 Persistent atrial fibrillation: Secondary | ICD-10-CM | POA: Diagnosis not present

## 2017-03-12 DIAGNOSIS — D649 Anemia, unspecified: Secondary | ICD-10-CM | POA: Diagnosis not present

## 2017-03-12 MED ORDER — PREDNISONE 10 MG PO TABS: ORAL_TABLET | ORAL | 0 refills | Status: DC

## 2017-03-12 MED ORDER — AZITHROMYCIN 250 MG PO TABS: 250.0000 mg | ORAL_TABLET | Freq: Every day | ORAL | 0 refills | Status: DC

## 2017-03-12 NOTE — Assessment & Plan Note (Signed)
Continue CPAP as ordered 

## 2017-03-12 NOTE — Assessment & Plan Note (Signed)
Please take prednisone as directed until completely gone Please take azithromycin as directed until completely gone

## 2017-03-12 NOTE — Progress Notes (Signed)
Subjective:    Patient ID: [name], female    DOB: 10/26/1950, 66 y.o.   MRN: [id number]  Asthma  She complains of cough, shortness of breath and wheezing. Associated symptoms include postnasal drip. Pertinent negatives include no ear pain, fever, headaches, rhinorrhea, sneezing, sore throat or trouble swallowing. Her past medical history is significant for asthma.   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  She was treated here for an acute exacerbation of her asthma in October. Unfortunately she was subsequently admitted to the hospital for a presumed CAP. The presentation was actually for back pain. Additionally there may have been some component of CHF at that time as well. A repeat chest x-ray was done on 04/26/16 that showed clearing of her infiltrates. Her current bronchodilator regimen includes Breo. Has albuterol available but has not used it. She is breathing better. She has daily hoarseness. No real cough. She has been getting over a lot of congestion and nasal gtt. She is on chlorpheniramine qhs, flonase, allegra. She is on CPAP reliably.   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 Also with diastolic CHF, atrial fibrillation, history of mitral valve repair. Secondary pulmonary hypertension due to all of the above. He has had some increased edema, increased weight, diuresis increased by cardiology 09/11/16.  She notes that her dyspnea has improved with the increase in her diuresis. The edema was  mostly abdominal and in her neck. She still has exertional dyspnea and has seen exertional desats on 2L/min. She was treated for AE's in January and February, CAP and pleuritic pain. Not resolved. Currently no wheeze, cough. She does have hoarse voice. She is on allegra and flonase qd, chlorpheniramine qhs.   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 Also followed elsewhere for diastolic CHF, atrial fibrillation, history mitral valve repair and secondary pulmonary hypertension. She is here today reporting that she has been having problems since 1 week ago > increased nasal congestion and drainage, led to wheeze, productive cough. She remains on breo, has tried her albuterol without a lot of change. She has chest and throat noise. Her wt has been stable. No real fever. She is on allegra, flonase, chlorpheniramine qhs.    Review of Systems  Constitutional: Negative for fever and unexpected weight change.  HENT: Positive for congestion and postnasal drip. Negative for dental problem, ear pain, nosebleeds, rhinorrhea, sinus pressure, sneezing, sore throat and trouble swallowing.   Eyes: Negative for redness and itching.  Respiratory: Positive for cough, shortness of breath and wheezing. Negative for chest tightness.   Cardiovascular: Negative for palpitations and leg swelling.  Gastrointestinal: Negative for nausea and vomiting.  Genitourinary: Negative for dysuria.  Musculoskeletal: Negative for joint swelling.  Skin: Negative for rash.  Neurological: Negative for headaches.  Hematological: Does not bruise/bleed easily.  Psychiatric/Behavioral: Negative for dysphoric mood. The patient is not nervous/anxious.        Objective:   Physical Exam  Vitals:   03/12/17  1213  BP: 128/78  Pulse: 74  SpO2: 97%  Weight: 156 lb 2 oz (70.8 kg)  Height: 5\' 2"  (1.575 m)  Gen:  Pleasant, well-nourished, in no distress,  normal affect  ENT: No lesions,  mouth clear,  oropharynx clear, some postnasal drip, hoarse voice  Neck: No JVD, no TMG, no carotid bruits  Lungs: No use of accessory muscles, clear without rales or rhonchi  Cardiovascular: RRR, 2/6 M  Musculoskeletal: No deformities, no cyanosis or clubbing  Neuro: alert, non focal  Skin: Warm, no lesions or rashes      Assessment & Plan:  Obstructive sleep apnea Continue CPAP as ordered  Allergic rhinitis Continue current regimen  Asthma, intrinsic No wheezing on exam today. Suspect that this is mostly upper airway irritation plus acute bronchitis  Please take prednisone as directed until completely gone Please take azithromycin as directed until completely gone Continue your Allegra, Flonase, chlorpheniramine as you have been taking them Continue Breo daily Take albuterol 2 puffs up to every 4 hours if needed for shortness of breath. Follow with Dr Lamonte Sakai next week as already scheduled.   Acute bronchitis Please take prednisone as directed until completely gone Please take azithromycin as directed until completely gone  Baltazar Apo, MD, PhD 03/12/2017, 12:45 PM [facility] [phone] or if no answer [phone]

## 2017-03-12 NOTE — Telephone Encounter (Signed)
Sent Rxs to [facility] on [address]. Pt called and said [facility]/ Nothing further is needed.

## 2017-03-12 NOTE — Addendum Note (Signed)
Addended by: Georjean Mode on: 03/12/2017 12:49 PM   Modules accepted: Orders

## 2017-03-12 NOTE — Patient Instructions (Addendum)
Please take prednisone as directed until completely gone Please take azithromycin as directed until completely gone Continue your Allegra, Flonase, chlorpheniramine as you have been taking them Continue Breo daily Take albuterol 2 puffs up to every 4 hours if needed for shortness of breath. Follow with Dr Lamonte Sakai next week as already scheduled.

## 2017-03-12 NOTE — Assessment & Plan Note (Signed)
No wheezing on exam today. Suspect that this is mostly upper airway irritation plus acute bronchitis  Please take prednisone as directed until completely gone Please take azithromycin as directed until completely gone Continue your Allegra, Flonase, chlorpheniramine as you have been taking them Continue Breo daily Take albuterol 2 puffs up to every 4 hours if needed for shortness of breath. Follow with Dr Lamonte Sakai next week as already scheduled.

## 2017-03-12 NOTE — Assessment & Plan Note (Signed)
Continue current regimen

## 2017-03-18 ENCOUNTER — Ambulatory Visit (INDEPENDENT_AMBULATORY_CARE_PROVIDER_SITE_OTHER): Payer: Medicare Other | Admitting: Emergency Medicine

## 2017-03-18 ENCOUNTER — Encounter: Payer: Self-pay | Admitting: Emergency Medicine

## 2017-03-18 ENCOUNTER — Telehealth: Payer: Self-pay | Admitting: Emergency Medicine

## 2017-03-18 DIAGNOSIS — J209 Acute bronchitis, unspecified: Secondary | ICD-10-CM | POA: Diagnosis not present

## 2017-03-18 DIAGNOSIS — J45909 Unspecified asthma, uncomplicated: Secondary | ICD-10-CM

## 2017-03-18 DIAGNOSIS — J301 Allergic rhinitis due to pollen: Secondary | ICD-10-CM

## 2017-03-18 MED ORDER — PREDNISONE 10 MG PO TABS: ORAL_TABLET | ORAL | 0 refills | Status: DC

## 2017-03-18 NOTE — Addendum Note (Signed)
Addended by: Desmond Dike C on: 03/18/2017 11:29 AM   Modules accepted: Orders

## 2017-03-18 NOTE — Progress Notes (Signed)
Subjective:    Patient ID: [name], female    DOB: November 12, 1950, 66 y.o.   MRN: [id number]  Asthma  She complains of cough, shortness of breath and wheezing. Associated symptoms include postnasal drip. Pertinent negatives include no ear pain, fever, headaches, rhinorrhea, sneezing, sore throat or trouble swallowing. Her past medical history is significant for asthma.    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 Also with diastolic CHF, atrial fibrillation, history of mitral valve repair. Secondary pulmonary hypertension due to all of the above. He has had some increased edema, increased weight, diuresis increased by cardiology 09/11/16.  She notes that her dyspnea has improved with the increase in her diuresis. The edema was mostly abdominal and in her neck. She still has exertional dyspnea and has seen exertional desats on 2L/min. She was treated for AE's in January and February, CAP and pleuritic pain. Not resolved. Currently no wheeze, cough. She does have hoarse voice. She is on allegra and flonase qd, chlorpheniramine qhs.   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 Also followed elsewhere for diastolic CHF, atrial fibrillation, history mitral valve repair and secondary pulmonary hypertension. She is here today reporting that she has been having problems since 1 week ago > increased nasal congestion and drainage, led to wheeze, productive cough. She remains on breo, has tried her albuterol without a lot of change. She has chest and throat noise. Her wt has been stable. No real fever. She is on allegra, flonase, chlorpheniramine qhs.   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 She also has diastolic CHF, atrial fibrillation, mitral valve disease with associated secondary pulmonary hypertension. I saw her one week ago with what sounded like a flare of her upper airway irritation associated with an acute bronchitis. I treated her with azithromycin. She only received partial prescription for prednisone taper. She tells me today that she is doing better, her mucous is starting to become more clear. She is taking her flonase once a day, wonders about going to [facility] during the Fall / Spring.    Review of Systems  Constitutional: Negative for fever and unexpected weight change.  HENT: Positive for congestion and postnasal drip. Negative for dental problem, ear pain, nosebleeds, rhinorrhea, sinus pressure, sneezing, sore throat and trouble swallowing.   Eyes: Negative for redness and itching.  Respiratory: Positive for cough, shortness of breath and wheezing. Negative for chest tightness.   Cardiovascular: Negative for palpitations and leg swelling.  Gastrointestinal: Negative for nausea and vomiting.  Genitourinary: Negative for dysuria.  Musculoskeletal: Negative for joint swelling.  Skin: Negative for rash.  Neurological: Negative for headaches.  Hematological: Does not bruise/bleed easily.  Psychiatric/Behavioral: Negative for dysphoric mood. The patient is not nervous/anxious.        Objective:   Physical Exam  Vitals:   03/18/17 1112  BP: 136/72  Pulse: 74  SpO2: 92%  Weight: 156 lb (70.8 kg)  Height: 5\' 2"  (1.575 m)  Gen: Pleasant, well-nourished, in no distress,  normal affect  ENT: No lesions,  mouth clear,  oropharynx clear, some postnasal drip, hoarse voice is improved  Neck: No JVD, no TMG, no carotid bruits  Lungs: No use  of accessory muscles, clear without rales or rhonchi  Cardiovascular: RRR, 2/6 M  Musculoskeletal: No deformities, no cyanosis or clubbing  Neuro: alert, non  focal  Skin: Warm, no lesions or rashes      Assessment & Plan:  Allergic rhinitis She has benefited in the past from increasing her fluticasone nasal spray to twice a day. Currently on once a day. We will go back to twice a day, continue Zyrtec, continue chlorpheniramine. She likely needs the nasal steroid and increased frequency during most of the year   Please increase fluticasone nasal spray to twice a day during the fall and spring months. You may need to use it twice a day during the summer as well. You should be able to decrease to once a day during the winter Continue your Zyrtec as you have been taking it, continue chlorpheniramine at night   Acute bronchitis Improving from 1 week ago. Azithromycin completed. She needs more prednisone in order to complete her prednisone taper as ordered.  Asthma, intrinsic  Continue Breo once a day.  Use albuterol 2 puffs if needed for shortness of breath.  Wear your oxygen as you have been doing.  Follow with Dr Lamonte Sakai in 6 months or sooner if you have any problems  Baltazar Apo, MD, PhD 03/18/2017, 11:26 AM [facility] [phone] or if no answer [phone]

## 2017-03-18 NOTE — Telephone Encounter (Signed)
Spoke with patient. She was upset that the additional 10 tabs of prednisone had been called into the wrong pharmacy. Apologized to patient and advise that I would in the medication to the correct pharmacy. She verbalized understanding. Nothing else needed at time of call.

## 2017-03-18 NOTE — Assessment & Plan Note (Signed)
She has benefited in the past from increasing her fluticasone nasal spray to twice a day. Currently on once a day. We will go back to twice a day, continue Zyrtec, continue chlorpheniramine. She likely needs the nasal steroid and increased frequency during most of the year   Please increase fluticasone nasal spray to twice a day during the fall and spring months. You may need to use it twice a day during the summer as well. You should be able to decrease to once a day during the winter Continue your Zyrtec as you have been taking it, continue chlorpheniramine at night

## 2017-03-18 NOTE — Assessment & Plan Note (Signed)
Improving from 1 week ago. Azithromycin completed. She needs more prednisone in order to complete her prednisone taper as ordered.

## 2017-03-18 NOTE — Assessment & Plan Note (Signed)
  Continue Breo once a day.  Use albuterol 2 puffs if needed for shortness of breath.  Wear your oxygen as you have been doing.  Follow with Dr Lamonte Sakai in 6 months or sooner if you have any problems

## 2017-03-18 NOTE — Patient Instructions (Addendum)
We will replenish year prednisone prescription so that he can finish our standard taper Please increase fluticasone nasal spray to twice a day during the fall and spring months. You may need to use it twice a day during the summer as well. You should be able to decrease to once a day during the winter Continue your Zyrtec as you have been taking it, continue chlorpheniramine at night Continue Breo once a day.  Use albuterol 2 puffs if needed for shortness of breath.  Wear your oxygen as you have been doing.  Follow with Dr Lamonte Sakai in 6 months or sooner if you have any problems

## 2017-03-20 ENCOUNTER — Other Ambulatory Visit (HOSPITAL_COMMUNITY): Payer: Self-pay | Admitting: *Deleted

## 2017-03-20 MED ORDER — SPIRONOLACTONE 25 MG PO TABS: 25.0000 mg | ORAL_TABLET | Freq: Every day | ORAL | 3 refills | Status: DC

## 2017-03-25 ENCOUNTER — Telehealth: Payer: Self-pay

## 2017-03-25 ENCOUNTER — Ambulatory Visit: Payer: Medicare Other | Admitting: Oncology

## 2017-03-25 ENCOUNTER — Other Ambulatory Visit: Payer: Medicare Other

## 2017-03-25 NOTE — Telephone Encounter (Signed)
Pt called to cancel her appt. She had bronchitis 3 weeks ago and was rx antibiotic and prednisone. She is still on prednisone from that. She has a stomach virus. Started last Wednesday. She had diarrhea and vomiting. The vomiting has stopped.  She has explosive diarrhea when she eats solid food. When she eats soft foods she has diarrhea that she can control and will go about every 6 hours.  No fever. Improving slowly. She will call in a few days to r/s.

## 2017-03-27 ENCOUNTER — Encounter (HOSPITAL_COMMUNITY): Payer: Self-pay | Admitting: Internal Medicine

## 2017-03-27 ENCOUNTER — Telehealth: Payer: Self-pay | Admitting: Oncology

## 2017-03-27 ENCOUNTER — Ambulatory Visit (HOSPITAL_COMMUNITY)
Admission: RE | Admit: 2017-03-27 | Discharge: 2017-03-27 | Disposition: A | Discharge status: Home / Self Care | Payer: Medicare Other | Source: Ambulatory Visit | Attending: Internal Medicine | Admitting: Internal Medicine

## 2017-03-27 VITALS — BP 108/80 | HR 91 | Wt 156.0 lb

## 2017-03-27 DIAGNOSIS — Z8249 Family history of ischemic heart disease and other diseases of the circulatory system: Secondary | ICD-10-CM | POA: Insufficient documentation

## 2017-03-27 DIAGNOSIS — I480 Paroxysmal atrial fibrillation: Secondary | ICD-10-CM | POA: Insufficient documentation

## 2017-03-27 DIAGNOSIS — Z825 Family history of asthma and other chronic lower respiratory diseases: Secondary | ICD-10-CM | POA: Insufficient documentation

## 2017-03-27 DIAGNOSIS — Z803 Family history of malignant neoplasm of breast: Secondary | ICD-10-CM | POA: Diagnosis not present

## 2017-03-27 DIAGNOSIS — F329 Major depressive disorder, single episode, unspecified: Secondary | ICD-10-CM | POA: Insufficient documentation

## 2017-03-27 DIAGNOSIS — D649 Anemia, unspecified: Secondary | ICD-10-CM | POA: Insufficient documentation

## 2017-03-27 DIAGNOSIS — I340 Nonrheumatic mitral (valve) insufficiency: Secondary | ICD-10-CM | POA: Insufficient documentation

## 2017-03-27 DIAGNOSIS — K219 Gastro-esophageal reflux disease without esophagitis: Secondary | ICD-10-CM | POA: Diagnosis not present

## 2017-03-27 DIAGNOSIS — G4733 Obstructive sleep apnea (adult) (pediatric): Secondary | ICD-10-CM | POA: Diagnosis not present

## 2017-03-27 DIAGNOSIS — I110 Hypertensive heart disease with heart failure: Secondary | ICD-10-CM | POA: Diagnosis not present

## 2017-03-27 DIAGNOSIS — Z9889 Other specified postprocedural states: Secondary | ICD-10-CM | POA: Diagnosis not present

## 2017-03-27 DIAGNOSIS — I341 Nonrheumatic mitral (valve) prolapse: Secondary | ICD-10-CM | POA: Diagnosis not present

## 2017-03-27 DIAGNOSIS — G253 Myoclonus: Secondary | ICD-10-CM | POA: Diagnosis not present

## 2017-03-27 DIAGNOSIS — I2721 Secondary pulmonary arterial hypertension: Secondary | ICD-10-CM | POA: Insufficient documentation

## 2017-03-27 DIAGNOSIS — Z7901 Long term (current) use of anticoagulants: Secondary | ICD-10-CM | POA: Insufficient documentation

## 2017-03-27 DIAGNOSIS — Z801 Family history of malignant neoplasm of trachea, bronchus and lung: Secondary | ICD-10-CM | POA: Insufficient documentation

## 2017-03-27 DIAGNOSIS — Z79899 Other long term (current) drug therapy: Secondary | ICD-10-CM | POA: Insufficient documentation

## 2017-03-27 DIAGNOSIS — Z79891 Long term (current) use of opiate analgesic: Secondary | ICD-10-CM | POA: Diagnosis not present

## 2017-03-27 DIAGNOSIS — I5032 Chronic diastolic (congestive) heart failure: Secondary | ICD-10-CM | POA: Insufficient documentation

## 2017-03-27 DIAGNOSIS — E669 Obesity, unspecified: Secondary | ICD-10-CM | POA: Insufficient documentation

## 2017-03-27 DIAGNOSIS — Z8041 Family history of malignant neoplasm of ovary: Secondary | ICD-10-CM | POA: Diagnosis not present

## 2017-03-27 DIAGNOSIS — I272 Pulmonary hypertension, unspecified: Secondary | ICD-10-CM

## 2017-03-27 DIAGNOSIS — J45909 Unspecified asthma, uncomplicated: Secondary | ICD-10-CM | POA: Insufficient documentation

## 2017-03-27 DIAGNOSIS — Z800 Family history of malignant neoplasm of digestive organs: Secondary | ICD-10-CM | POA: Diagnosis not present

## 2017-03-27 LAB — BASIC METABOLIC PANEL
Anion gap: 9 (ref 5–15)
BUN: 41 mg/dL — AB (ref 6–20)
CHLORIDE: 98 mmol/L — AB (ref 101–111)
CO2: 28 mmol/L (ref 22–32)
CREATININE: 1.74 mg/dL — AB (ref 0.44–1.00)
Calcium: 9.3 mg/dL (ref 8.9–10.3)
GFR calc non Af Amer: 29 mL/min — ABNORMAL LOW (ref >60)
GFR, EST AFRICAN AMERICAN: 34 mL/min — AB (ref >60)
Glucose, Bld: 103 mg/dL — ABNORMAL HIGH (ref 65–99)
POTASSIUM: 4.7 mmol/L (ref 3.5–5.1)
SODIUM: 135 mmol/L (ref 135–145)

## 2017-03-27 LAB — CBC
HCT: 28 % — ABNORMAL LOW (ref 36.0–46.0)
HEMOGLOBIN: 8.7 g/dL — AB (ref 12.0–15.0)
MCH: 35.5 pg — AB (ref 26.0–34.0)
MCHC: 31.1 g/dL (ref 30.0–36.0)
MCV: 114.3 fL — ABNORMAL HIGH (ref 78.0–100.0)
Platelets: 478 10*3/uL — ABNORMAL HIGH (ref 150–400)
RBC: 2.45 MIL/uL — ABNORMAL LOW (ref 3.87–5.11)
RDW: 16.7 % — AB (ref 11.5–15.5)
WBC: 21.4 10*3/uL — ABNORMAL HIGH (ref 4.0–10.5)

## 2017-03-27 NOTE — Progress Notes (Signed)
Advanced Heart Failure Clinic Note   PCP: Dr Inda Merlin  HF: Dr. Haroldine Laws  Pulmonary: Dr Lamonte Sakai   HPI: [name] is a 66 y/o woman with h/o HTN, pyruvate kinase deficiency with hemolytic anemia, hemochromatosis, obesity, asthma, OSA (on CPAP), mitral valve prolapse s/p mini MV repair/Maze (2011 with Dr. [name]), PAF s/p RFA followed by surgical Maze 6237 and diastolic dysfunction.  Pre-op cath with normal coronaries in 2011. 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   Baseline hgb 7-9. Doesn't get transfused unless 6 or below.  Underwent DC-CV in 6/18  Today she returns for HF follow. Not feeling well. Recently had GI bug with watery diarrhea and ab cramping. Poor appetite. Lost about 10 pounds. Now diarrhea resolving. No fevers or chills. Also had bronchitis. Taking torsemide 40 bid. Hasn't needed metolazone. Edema well controlled. No orthopnea or PND. No palpitations. No productive cough. Finished Pulmonary Rehab but didn't enroll in the maintenance program.     RHC 06/24/12 RA = 10  RV = 60/7/12  PA = 58/25 (40)  PCW = 19 (no significant v-waves)  Fick cardiac output/index = 6.6/3.7  PVR = 3.1 [place]  FA sat = 91%  PA sat = 66%, 67% PVR was not significantly elevated so selective pulmonary vasodilators were noted started.   RHC 6/16 RA = 17 PA = 74/31 (46) PCWP = 27 CI 3.89 PVR 2.8 WU  RHC 01/22/2017 RA = 10 RV = 74/14 PA = 74/27 (44) PCW = 23 (v waves to 40) Fick cardiac output/index = 6.1/3.8 PVR = 3.4 WU Ao sat = 98% PA sat = 68%, 69%  Echo 12/13 EF 60-65% mild LVH. Grade 2 DD. MVR stable (no MS noted). Moderate LAE. RV mildly dilated. Mod-severe TR with estimated RVSP 80-90 range (in 2011 was 40-45 mm HG) ECHO 10/22/12 EF 55% RV mildly decreased + Septal bounce. Moderate TR. RVSP 65-70. +diastolic dysfunction (likely restrictive - but tissue Doppler not performed).  ECHO 05/17/2014: EF 50-55%  TEE (4/15): EF 60-65%, severe bilateral atrial  enlargement, s/p MV ring with no MR and mild mitral stenosis with meean gradient 6, MVA 1.7 cm^2.  PASP on TTE in 4/15 was 60 mmHg.  Echo (6/16): EF 50-55%, moderate diastolic dysfunction, D-shaped interventricular septum, moderately dilated RV with mildly decreased systolic function, PA systolic pressure 60 mmHg, Echo 11/17 EF 55-60% RV dilated. D-shaped septum RVSP 70mmHG C not dilated, s/p MV repair with mild MS and trivial MR.    PFTs 2/14 FEV1 1.20L         FVC    1.68L         DLCO 36%   ROS: All systems negative except as listed in HPI, PMH and Problem List.  Past Medical History:  Diagnosis Date  . Allergic rhinitis   . Anemia   . Asthma   . Asthma   . Atrial fibrillation ([place])    s/p RFA and Maze procedure - resolved  . Atrial tachycardia ([place])   . Complication of anesthesia    trouble waking up  . Depression   . Dyspnea   . Exercise hypoxemia    supplemental O2 PRN  . Fatigue   . Gastritis    NSAID induced  . GERD (gastroesophageal reflux disease)   . Heart murmur    severe MR s/p MV repair  . Hx of asplenia    surgical  . Hypertension   . Mitral regurgitation    s/p  MV repair complicated by diaphragmatic paralysis  . Myoclonus   . Osteoporosis   . Pulmonary HTN ([place])    mild to moderate by [place] 05/2012  . Pyruvate kinase deficiency hemolytic anemia ([place])   . Syncope and collapse 2012   r/t anemia  . Trochanteric bursitis    left hip    Current Outpatient Prescriptions  Medication Sig Dispense Refill  . albuterol (PROVENTIL HFA;VENTOLIN HFA) 108 (90 Base) MCG/ACT inhaler Inhale 2 puffs into the lungs every 6 (six) hours as needed for wheezing or shortness of breath. 1 Inhaler 2  . augmented betamethasone dipropionate (DIPROLENE-AF) 0.05 % ointment Apply 1 application topically daily as needed for rash.    . bisoprolol (ZEBETA) 5 MG tablet TAKE 1 TABLET(5 MG) BY MOUTH TWICE DAILY 180 tablet 3  . BREO ELLIPTA 100-25 MCG/INH AEPB INHALE 1 PUFF BY MOUTH EVERY  DAY 60 each 5  . calcium-vitamin D (OSCAL WITH D) 500-200 MG-UNIT tablet Take 1 tablet by mouth 2 (two) times daily.    . chlorpheniramine (CHLOR-TRIMETON) 4 MG tablet Take 4 mg by mouth at bedtime.    . cholecalciferol (VITAMIN D) 1000 UNITS tablet Take 1,000 Units by mouth 2 (two) times daily.     . clonazePAM (KLONOPIN) 1 MG tablet Take 2 mg by mouth at bedtime.     Marland Kitchen deferasirox (EXJADE) 500 MG disintegrating tablet Take 3 tablets (1,500 mg total) by mouth daily before breakfast. Faxed to [employer] [facility] [phone] 90 tablet 3  . dextromethorphan (DELSYM) 30 MG/5ML liquid Take 60 mg by mouth at bedtime as needed for cough.    . diclofenac sodium (VOLTAREN) 1 % GEL Apply 2 g topically 4 (four) times daily as needed. (Patient taking differently: Apply 2 g topically 4 (four) times daily as needed (for pain). ) 100 g 0  . diltiazem (CARDIZEM CD) 240 MG 24 hr capsule TAKE 1 CAPSULE (240 MG TOTAL) BY MOUTH DAILY. 30 capsule 3  . diltiazem (CARTIA XT) 240 MG 24 hr capsule Take 1 capsule (240 mg total) by mouth daily with breakfast. 90 capsule 3  . ELIQUIS 5 MG TABS tablet TAKE 1 TABLET BY MOUTH TWICE A DAY 60 tablet 5  . EPINEPHrine (EPI-PEN) 0.3 mg/0.3 mL DEVI Inject 0.3 mg into the muscle once as needed (for allergic reaction).     Marland Kitchen escitalopram (LEXAPRO) 10 MG tablet Take 10 mg by mouth at bedtime.     . fexofenadine (ALLEGRA) 180 MG tablet Take 180 mg by mouth daily.     . fluticasone (FLONASE) 50 MCG/ACT nasal spray Place 1 spray into both nostrils daily.    . folic acid (FOLVITE) 347 MCG tablet Take 400 mcg by mouth daily.    . Lactobacillus Rhamnosus, GG, (CULTURELLE) CAPS Take 1 capsule by mouth daily.    . Multiple Vitamins-Minerals (AIRBORNE) TBEF Take 1 tablet by mouth 2 (two) times daily. *Dissolves in water twice a day*    . OXYGEN Inhale 3 L into the lungs continuous.     . pantoprazole (PROTONIX) 40 MG tablet Take 40 mg by mouth daily before breakfast.     .  potassium chloride SA (KLOR-CON M20) 20 MEQ tablet Take 3 tablets (60 mEq total) by mouth 3 (three) times daily. 270 tablet 3  . raloxifene (EVISTA) 60 MG tablet Take 60 mg by mouth daily with breakfast.     . senna-docusate (SENOKOT-S) 8.6-50 MG tablet Take 1 tablet by mouth at bedtime as needed for mild constipation.  30 tablet 0  . sodium chloride (OCEAN) 0.65 % SOLN nasal spray Place 1 spray into both nostrils 2 (two) times daily.     Marland Kitchen spironolactone (ALDACTONE) 25 MG tablet Take 1 tablet (25 mg total) by mouth daily. 90 tablet 3  . torsemide (DEMADEX) 20 MG tablet TAKE 2 TABLETS (40 MG TOTAL) BY MOUTH 2 (TWO) TIMES DAILY. 120 tablet 6  . traMADol (ULTRAM) 50 MG tablet Take 1-2 tablets (50-100 mg total) by mouth 3 (three) times daily. Take 100 mg in the morning, 50mg s in the afternoon, and 100 mgs at bedtime 150 tablet 0  . metolazone (ZAROXOLYN) 2.5 MG tablet Take 1 tablet (2.5 mg total) by mouth as needed. (Patient not taking: Reported on 03/27/2017) 10 tablet 3   No current facility-administered medications for this encounter.    Social History  Substance Use Topics  . Smoking status: Never Smoker  . Smokeless tobacco: Never Used  . Alcohol use Yes     Comment: socially glass red wine monthly   Family History  Problem Relation Age of Onset  . Breast cancer Mother 37       ER+  . Melanoma Father 61  . Adrenal disorder Sister        pheochromacytoma - rt. adrenal gland  . COPD Maternal Aunt   . Stomach cancer Paternal Uncle        onset in 83s  . Lung cancer Maternal Grandmother        died late 74s, heavy smoker  . Heart disease Maternal Grandfather        hardening of the arteries, poor circulation  . Ovarian cancer Sister 67       stage IV, now 41    Vitals:   03/27/17 1139  BP: 108/80  Pulse: 91  SpO2: 97%  Weight: 156 lb (70.8 kg)   Wt Readings from Last 3 Encounters:  03/27/17 156 lb (70.8 kg)  03/18/17 156 lb (70.8 kg)  03/12/17 156 lb 2 oz (70.8 kg)   Reds  Vest 28%.   PHYSICAL EXAM: General:  Weak appearing No resp difficulty Wearing o2 HEENT: normal Neck: supple. no JVD. Carotids 2+ bilat; no bruits. No lymphadenopathy or thryomegaly appreciated. Cor: PMI nondisplaced. Regular rate & rhythm. No rubs, gallops or murmurs. Lungs: clear but very decreaed throughout Abdomen: obese soft, nontender, nondistended. No hepatosplenomegaly. No bruits or masses. Good bowel sounds. Extremities: no cyanosis, clubbing, rash, edema Neuro: alert & orientedx3, cranial nerves grossly intact. moves all 4 extremities w/o difficulty. Affect pleasant  ECG: NSR 73 bpm    ASSESSMENT & PLAN:  1. Atrial fibrillation, paroxysmal:  - s/p DC-CV 6/18 - Maintaining NSR. Continue Eliquis. No bleeding, - Continue bisoprolol 5 mg BID and diltiazem CD 240 mg daily 2. Pulmonary arterial hypertension: PVR was only 2.8 WU on 6/16 RHC. The PAH appears to be primarily Group 3 in the setting of OSA and chronic respiratory failure (on home oxygen, ?OHS).  Echo 6/16 showed that the RV is moderately dilated with mildly decreased systolic function. ECHO 03/2016 EF 55-60% Peak PA pressure 86 mm hg.   - Remains NYHA IIIb.  On 3 liters oxygen.  - Continue OSA at night and oxygen during the day. - Stressed need to continue with exercise program after finishing Pulmonary Rehab   3. Chronic diastolic CHF: - Volume status looks good today. Recently had GI bug.  - Continue torsemide and metolazone 2.5 mg (as needed) - .Reinforced fluid restriction to < 2  L daily, sodium restriction to less than 2000 mg daily, and the importance of daily weights.   Discussed limting high salt food.  - B<ET today  4. OHS/OSA:  -Continue nightly CPAP.   5. 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   - Follows with Dr. Benay Spice.   - Will check CBC today 6. S/p MV repair:  - Stable with mild residual MS.     Amanda Bickers, MD  03/27/2017

## 2017-03-27 NOTE — Patient Instructions (Signed)
Labs today  We will contact you in 4 months to schedule your next appointment.  

## 2017-03-27 NOTE — Telephone Encounter (Signed)
Scheduled appt per 10/29 sch msg. Patient was sick and missed her appt on Monday - she said she would call back to discuss the date.

## 2017-03-29 ENCOUNTER — Telehealth (HOSPITAL_COMMUNITY): Payer: Self-pay | Admitting: *Deleted

## 2017-03-29 NOTE — Telephone Encounter (Signed)
-----   Message from Jolaine Artist, MD sent at 03/27/2017 11:10 PM EDT ----- Wbc 21K. She is just getting over a viral infection but that is very high. No more fevers.  Heather - Please check UA & Ucx. Repeat CBC next week with manual diff. Thanks

## 2017-03-30 ENCOUNTER — Other Ambulatory Visit: Payer: Self-pay | Admitting: Cardiology

## 2017-04-01 ENCOUNTER — Ambulatory Visit (HOSPITAL_COMMUNITY)
Admission: RE | Admit: 2017-04-01 | Discharge: 2017-04-01 | Disposition: A | Discharge status: Home / Self Care | Payer: Medicare Other | Source: Ambulatory Visit | Attending: Cardiology | Admitting: Cardiology

## 2017-04-01 DIAGNOSIS — I5032 Chronic diastolic (congestive) heart failure: Secondary | ICD-10-CM | POA: Diagnosis not present

## 2017-04-01 LAB — URINALYSIS, ROUTINE W REFLEX MICROSCOPIC
BILIRUBIN URINE: NEGATIVE
GLUCOSE, UA: NEGATIVE mg/dL
HGB URINE DIPSTICK: NEGATIVE
Ketones, ur: NEGATIVE mg/dL
Leukocytes, UA: NEGATIVE
Nitrite: NEGATIVE
PROTEIN: NEGATIVE mg/dL
Specific Gravity, Urine: 1.005 (ref 1.005–1.030)
pH: 6 (ref 5.0–8.0)

## 2017-04-02 ENCOUNTER — Telehealth: Payer: Self-pay

## 2017-04-02 LAB — URINE CULTURE: CULTURE: NO GROWTH

## 2017-04-02 NOTE — Telephone Encounter (Signed)
Pt called about r/s missed appt when she was sick. She has appt on 11/20. She stated she wrote it on her calendar.

## 2017-04-09 ENCOUNTER — Telehealth (HOSPITAL_COMMUNITY): Payer: Self-pay

## 2017-04-09 MED ORDER — POTASSIUM CHLORIDE CRYS ER 20 MEQ PO TBCR: 60.0000 meq | EXTENDED_RELEASE_TABLET | Freq: Three times a day (TID) | ORAL | 3 refills | Status: DC

## 2017-04-09 NOTE — Telephone Encounter (Signed)
Patient requesting updated Rx for potassium 60 meq TID be sent to [facility] preferred [facility] [address] as they still have her old Rx of 60 meq BID. New Rx sent electronically.  Renee Pain, RN

## 2017-04-15 DIAGNOSIS — I481 Persistent atrial fibrillation: Secondary | ICD-10-CM | POA: Diagnosis not present

## 2017-04-15 DIAGNOSIS — G2581 Restless legs syndrome: Secondary | ICD-10-CM | POA: Diagnosis not present

## 2017-04-15 DIAGNOSIS — R0902 Hypoxemia: Secondary | ICD-10-CM | POA: Diagnosis not present

## 2017-04-15 DIAGNOSIS — D649 Anemia, unspecified: Secondary | ICD-10-CM | POA: Diagnosis not present

## 2017-04-15 DIAGNOSIS — Z23 Encounter for immunization: Secondary | ICD-10-CM | POA: Diagnosis not present

## 2017-04-15 DIAGNOSIS — K219 Gastro-esophageal reflux disease without esophagitis: Secondary | ICD-10-CM | POA: Diagnosis not present

## 2017-04-15 DIAGNOSIS — G4733 Obstructive sleep apnea (adult) (pediatric): Secondary | ICD-10-CM | POA: Diagnosis not present

## 2017-04-15 DIAGNOSIS — M545 Low back pain: Secondary | ICD-10-CM | POA: Diagnosis not present

## 2017-04-15 DIAGNOSIS — I272 Pulmonary hypertension, unspecified: Secondary | ICD-10-CM | POA: Diagnosis not present

## 2017-04-15 DIAGNOSIS — I10 Essential (primary) hypertension: Secondary | ICD-10-CM | POA: Diagnosis not present

## 2017-04-15 DIAGNOSIS — J309 Allergic rhinitis, unspecified: Secondary | ICD-10-CM | POA: Diagnosis not present

## 2017-04-15 DIAGNOSIS — E559 Vitamin D deficiency, unspecified: Secondary | ICD-10-CM | POA: Diagnosis not present

## 2017-04-16 ENCOUNTER — Other Ambulatory Visit (HOSPITAL_BASED_OUTPATIENT_CLINIC_OR_DEPARTMENT_OTHER): Payer: Medicare Other

## 2017-04-16 ENCOUNTER — Telehealth: Payer: Self-pay | Admitting: Oncology

## 2017-04-16 ENCOUNTER — Ambulatory Visit (HOSPITAL_BASED_OUTPATIENT_CLINIC_OR_DEPARTMENT_OTHER): Payer: Medicare Other | Admitting: Oncology

## 2017-04-16 VITALS — BP 130/52 | HR 64 | Temp 98.5°F | Resp 17 | Ht 62.0 in | Wt 165.7 lb

## 2017-04-16 DIAGNOSIS — D594 Other nonautoimmune hemolytic anemias: Secondary | ICD-10-CM | POA: Diagnosis not present

## 2017-04-16 LAB — CBC WITH DIFFERENTIAL/PLATELET
BASO%: 0.8 % (ref 0.0–2.0)
BASOS ABS: 0.1 10*3/uL (ref 0.0–0.1)
EOS%: 5.4 % (ref 0.0–7.0)
Eosinophils Absolute: 0.5 10*3/uL (ref 0.0–0.5)
HCT: 25.6 % — ABNORMAL LOW (ref 34.8–46.6)
HEMOGLOBIN: 7.8 g/dL — AB (ref 11.6–15.9)
LYMPH#: 2.5 10*3/uL (ref 0.9–3.3)
LYMPH%: 25.2 % (ref 14.0–49.7)
MCH: 35.1 pg — AB (ref 25.1–34.0)
MCHC: 30.5 g/dL — ABNORMAL LOW (ref 31.5–36.0)
MCV: 115.3 fL — AB (ref 79.5–101.0)
MONO#: 2.1 10*3/uL — ABNORMAL HIGH (ref 0.1–0.9)
MONO%: 20.9 % — AB (ref 0.0–14.0)
NEUT#: 4.8 10*3/uL (ref 1.5–6.5)
NEUT%: 47.7 % (ref 38.4–76.8)
NRBC: 1 % — AB (ref 0–0)
Platelets: 499 10*3/uL — ABNORMAL HIGH (ref 145–400)
RBC: 2.22 10*6/uL — AB (ref 3.70–5.45)
RDW: 15.1 % — AB (ref 11.2–14.5)
WBC: 10.1 10*3/uL (ref 3.9–10.3)

## 2017-04-16 NOTE — Progress Notes (Signed)
  [facility] OFFICE PROGRESS NOTE   Diagnosis: Pyruvate kinase deficiency  INTERVAL HISTORY:   [name] returns as scheduled.  She continues Exjade.  She reports persistent bone pain.  She takes 4 tramadol tablets per day. She is followed closely by Dr. Lamonte Sakai for asthma and upper respiratory infections.  She is now maintained on 3 L nasal cannula oxygen.  Objective:  Vital signs in last 24 hours:  Blood pressure (!) 130/52, pulse 64, temperature 98.5 F (36.9 C), temperature source Oral, resp. rate 17, height 5\' 2"  (1.575 m), weight 165 lb 11.2 oz (75.2 kg), SpO2 93 %.    Resp: Lungs clear bilaterally Cardio: Regular rate and rhythm with premature beats GI: No hepatomegaly, nontender Vascular: No leg edema   Lab Results:  Lab Results  Component Value Date   WBC 10.1 04/16/2017   HGB 7.8 (L) 04/16/2017   HCT 25.6 (L) 04/16/2017   MCV 115.3 (H) 04/16/2017   PLT 499 (H) 04/16/2017   NEUTROABS 4.8 04/16/2017    CMP     Component Value Date/Time   NA 135 03/27/2017 1222   NA 141 08/05/2012 1028   K 4.7 03/27/2017 1222   K 3.4 (L) 08/05/2012 1028   CL 98 (L) 03/27/2017 1222   CL 105 08/05/2012 1028   CO2 28 03/27/2017 1222   CO2 27 08/05/2012 1028   GLUCOSE 103 (H) 03/27/2017 1222   GLUCOSE 95 08/05/2012 1028   BUN 41 (H) 03/27/2017 1222   BUN 17.1 08/05/2012 1028   CREATININE 1.74 (H) 03/27/2017 1222   CREATININE 0.8 08/05/2012 1028   CALCIUM 9.3 03/27/2017 1222   CALCIUM 9.0 08/05/2012 1028   PROT 6.7 07/13/2016 0510   PROT 7.2 08/05/2012 1028   ALBUMIN 3.4 (L) 07/13/2016 0510   ALBUMIN 3.6 08/05/2012 1028   AST 19 07/13/2016 0510   AST 21 08/05/2012 1028   ALT 13 (L) 07/13/2016 0510   ALT 13 08/05/2012 1028   ALKPHOS 67 07/13/2016 0510   ALKPHOS 84 08/05/2012 1028   BILITOT 3.6 (H) 07/13/2016 0510   BILITOT 2.34 (H) 08/05/2012 1028   GFRNONAA 29 (L) 03/27/2017 1222   GFRAA 34 (L) 03/27/2017 1222     Medications: I have reviewed  the patient's current medications.  Assessment/Plan: 1. Hereditary pyruvate kinase deficiency, chronic anemia 2. Secondary iron overload maintained on Exjade, currently taking Exjade every other day 3. History of atrial fibrillation/flutter, status post a Maze procedure 4. History of mitral regurgitation, status post mitral valve repair by Dr. [name] in February 2010 5. 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 History of right diaphragm paralysis 7. Admission 04/02/2016 with left lung pneumoni  Disposition:  [name] appears unchanged.  We will follow-up in the ferritin level from today.  She will return for a lab visit in 3 months and an office visit in 6 months.  The hemoglobin is slightly lower today, but has been lower than this in the past.  We will check a reticulocyte count to be sure she is mounting an appropriate reticulocytosis.  She is taking folic acid.  15 minutes were spent with the patient today.  The majority of the time was used for counseling and coordination of care.  [name], MD  04/16/2017  3:51 PM

## 2017-04-16 NOTE — Telephone Encounter (Signed)
Gave avs and calendar for February and May 2019 °

## 2017-04-17 LAB — RETICULOCYTES

## 2017-04-17 LAB — FERRITIN: Ferritin: 34 ng/ml (ref 9–269)

## 2017-04-30 ENCOUNTER — Telehealth: Payer: Self-pay | Admitting: Pharmacy Technician

## 2017-04-30 DIAGNOSIS — N958 Other specified menopausal and perimenopausal disorders: Secondary | ICD-10-CM | POA: Diagnosis not present

## 2017-04-30 DIAGNOSIS — M8588 Other specified disorders of bone density and structure, other site: Secondary | ICD-10-CM | POA: Diagnosis not present

## 2017-04-30 NOTE — Telephone Encounter (Signed)
Oral Oncology Patient Advocate Encounter  Met patient in [place] to complete a renewal application for [employer] ([employer]) in an effort to keep the patient's out of pocket expense for Exjade at $0.    Application completed and faxed to [phone].   [employer] phone number for follow up is [phone].   This encounter will be updated until final determination.   [name]. [name], [place] Patient [place] [phone] 04/30/2017 3:05 PM

## 2017-05-01 ENCOUNTER — Other Ambulatory Visit: Payer: Self-pay | Admitting: Cardiology

## 2017-05-14 ENCOUNTER — Other Ambulatory Visit (HOSPITAL_COMMUNITY): Payer: Self-pay | Admitting: Internal Medicine

## 2017-05-17 NOTE — Telephone Encounter (Signed)
Received a fax from [employer] that they had not received the application for the patient's 2019 re-enrollment.  I contacted [employer].  The representative informed me that they HAD received the application and that it was currently being processed.   [name]. [name], [name] [name] [phone] 05/17/2017 11:42 AM

## 2017-05-20 ENCOUNTER — Telehealth (HOSPITAL_COMMUNITY): Payer: Self-pay | Admitting: Pharmacist

## 2017-05-20 MED ORDER — METOPROLOL SUCCINATE ER 50 MG PO TB24: 50.0000 mg | ORAL_TABLET | Freq: Two times a day (BID) | ORAL | 2 refills | Status: DC

## 2017-05-20 NOTE — Telephone Encounter (Signed)
[facility] until at least February. Per discussion with Dr. Haroldine Laws, will switch to metoprolol succinate 50 mg BID in the meantime.   Ruta Hinds. Velva Harman, PharmD, BCPS, CPP Clinical Pharmacist Pager: [phone] Phone: [phone] 05/20/2017 12:09 PM

## 2017-05-23 DIAGNOSIS — R21 Rash and other nonspecific skin eruption: Secondary | ICD-10-CM | POA: Diagnosis not present

## 2017-05-31 ENCOUNTER — Telehealth: Payer: Self-pay | Admitting: Emergency Medicine

## 2017-05-31 ENCOUNTER — Telehealth: Payer: Self-pay | Admitting: Pharmacy Technician

## 2017-05-31 NOTE — Telephone Encounter (Signed)
Oral Oncology Patient Advocate Encounter  Prior Authorization for Exjade has been approved.    PA# [id number] Effective dates: 05/26/17 through 05/27/18  Oral Oncology Clinic will continue to follow.   [name]. [name], [place] Patient [place] [phone] 05/31/2017 12:37 PM

## 2017-05-31 NOTE — Telephone Encounter (Signed)
Spoke with [name]. States that her current [employer] home concentrator has stopped working. Reports that she is currently using her POC at night time. [name] will need a new concentrator order to be sent to [employer]. She was instructed by [employer] that she would need a walk test before they could process the order. [name] has been made an appointment with RB on 06/05/17 at 1:30pm to take care of all of this. Nothing further was needed.

## 2017-05-31 NOTE — Telephone Encounter (Signed)
Oral Oncology Patient Advocate Encounter  Received notification from [place] that the existing prior authorization for Exjade is due for renewal.  PA request was submitted on CoverMyMeds   KEY: [id number] Status is pending  Oral Oncology Clinic will continue to follow.  [name]. [name], [place] Patient [name] [phone] 05/31/2017 8:37 AM

## 2017-06-04 ENCOUNTER — Other Ambulatory Visit: Payer: Self-pay | Admitting: Emergency Medicine

## 2017-06-05 ENCOUNTER — Encounter: Payer: Self-pay | Admitting: Emergency Medicine

## 2017-06-05 ENCOUNTER — Ambulatory Visit (INDEPENDENT_AMBULATORY_CARE_PROVIDER_SITE_OTHER): Payer: Medicare Other | Admitting: Emergency Medicine

## 2017-06-05 DIAGNOSIS — J45909 Unspecified asthma, uncomplicated: Secondary | ICD-10-CM | POA: Diagnosis not present

## 2017-06-05 MED ORDER — PREDNISONE 20 MG PO TABS: 40.0000 mg | ORAL_TABLET | Freq: Every day | ORAL | 0 refills | Status: DC

## 2017-06-05 MED ORDER — AZITHROMYCIN 250 MG PO TABS: ORAL_TABLET | ORAL | 0 refills | Status: AC

## 2017-06-05 NOTE — Patient Instructions (Signed)
We will check your oxygen saturations to requalify you for supplemental oxygen.  This will allow [place] to write prescription for your [employer] device.  Please continue Breo once a day Take prednisone as directed until completely gone Take azithromycin until completely gone  Continue Flonase, Allegra, chlorpheniramine Use over-the-counter decongestants as needed until your upper respiratory infection has resolved Follow with Dr Lamonte Sakai in 3 months or sooner if you have any problems.

## 2017-06-05 NOTE — Progress Notes (Signed)
Subjective:    Amanda [name] ID: Amanda [name], female    DOB: [birth year], 67 y.o.   MRN: [id number]  Asthma  Amanda [name] complains of cough, shortness of breath and wheezing. Associated symptoms include postnasal drip. Pertinent negatives include no ear pain, fever, headaches, rhinorrhea, sneezing, sore throat or trouble swallowing. Amanda [name] past medical history is significant for asthma.    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 Also with diastolic CHF, atrial fibrillation, history of mitral valve repair. Secondary pulmonary hypertension due to all of Amanda above. He has had some increased edema, increased weight, diuresis increased by cardiology 09/11/16.  Amanda [name] notes that Amanda [name] dyspnea has improved with Amanda increase in Amanda [name] diuresis. Amanda edema was mostly abdominal and in Amanda [name] neck. Amanda [name] still has exertional dyspnea and has seen exertional desats on 2L/min. Amanda [name] was treated for AE's in January and February, CAP and pleuritic pain. Not resolved. Currently no wheeze, cough. Amanda [name] does have hoarse voice. Amanda [name] is on allegra and [name] qd, chlorpheniramine qhs.   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 Also followed elsewhere for diastolic CHF, atrial fibrillation, history mitral valve repair and secondary pulmonary hypertension. Amanda [name] is here today reporting that Amanda [name] has been having problems since 1 week ago > increased nasal congestion and drainage, led to wheeze, productive cough. Amanda [name] remains on breo, has tried Amanda [name] albuterol without a lot of change. Amanda [name] has chest and throat noise. Amanda [name] wt has been stable. No real fever. Amanda [name] is on allegra, [name], chlorpheniramine qhs.   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 Amanda [name] also has diastolic CHF, atrial fibrillation, mitral valve disease with associated secondary pulmonary hypertension. Amanda [name] one week ago with what sounded like a flare of Amanda [name] upper airway irritation associated with an acute bronchitis. Amanda treated Amanda [name] with azithromycin. Amanda [name] only received partial prescription for prednisone taper. Amanda [name] today that Amanda [name] is doing better, Amanda [name] is starting to become more clear. Amanda [name] once a day, wonders about going to bid during Amanda Fall / Spring.   ROV 06/05/17 --Amanda [name] is 67 year old [name] with a history of asthma, obstructive sleep apnea on CPAP, restrictive lung disease due to a paralyzed hemidiaphragm.  Also with a history of secondary pulmonary hypertension, diastolic CHF in Amanda setting of atrial fibrillation, hypertension, mitral valve disease.  Amanda [name] uses an [employer] oxygen system both in house and portable concentrator's.  Amanda [name] needs maintenance on these.  Currently managed on Breo, [name], Allegra, chlorpheniramine.  Uses albuterol rarely until recent URI, has been using more frequently since then. Has had more cough, some wheeze for last 2 days. Amanda [name] will need to be requalified for Amanda [name] O2 today.    Review of Systems  Constitutional: Negative for fever and unexpected weight change.  HENT: Positive for congestion and postnasal drip. Negative for dental problem, ear pain, nosebleeds, rhinorrhea, sinus pressure, sneezing, sore throat and trouble swallowing.   Eyes: Negative for redness and itching.  Respiratory: Positive for cough, shortness of breath and wheezing. Negative for chest tightness.   Cardiovascular: Negative for palpitations and leg swelling.  Gastrointestinal: Negative for nausea and vomiting.  Genitourinary: Negative for dysuria.  Musculoskeletal: Negative for  joint swelling.  Skin: Negative for rash.  Neurological: Negative for headaches.  Hematological: Does not  bruise/bleed easily.  Psychiatric/Behavioral: Negative for dysphoric mood. Amanda [name] is not nervous/anxious.        Objective:   Physical Exam  Vitals:   06/05/17 1333  BP: 104/64  Pulse: 79  Weight: 158 lb (71.7 kg)  Height: 5\' 2"  (1.575 m)  Gen: Pleasant, well-nourished, in no distress,  normal affect  ENT: No lesions,  mouth clear,  oropharynx clear, some postnasal drip, hoarse voice   Neck: No JVD, no TMG, no carotid bruits  Lungs: No use of accessory muscles, bilateral coarse low pitched expiratory wheezes  Cardiovascular: RRR, 2/6 M  Musculoskeletal: No deformities, no cyanosis or clubbing  Neuro: alert, non focal  Skin: Warm, no lesions or rashes      Assessment & Plan:  Obstructive sleep apnea Continue CPAP  Allergic rhinitis Continue [name], Allegra, chlorpheniramine  Asthma, intrinsic With a mild acute flare, some low pitched wheezing on exam.  This in Amanda setting of an upper respiratory infection.  Please continue Breo once a day Take prednisone as directed until completely gone Take azithromycin until completely gone   Chronic respiratory failure (HCC) Chronic hypoxemic respiratory failure.  Recheck of Amanda [name] saturations documents that Amanda [name] desaturated to 81% on room air at rest today.  We will order new equipment from [place].  Amanda [name] will need a portable oxygen concentrator because Amanda [name] is unable to carry and adequately ambulate with standard tanks due to weakness and to arthritic back disease and back pain.  Baltazar Apo, MD, PhD 06/05/2017, 1:55 PM [facility] [phone] or if no answer [phone]

## 2017-06-05 NOTE — Assessment & Plan Note (Signed)
Continue Flonase, Allegra, chlorpheniramine

## 2017-06-05 NOTE — Assessment & Plan Note (Signed)
Continue CPAP.  

## 2017-06-05 NOTE — Assessment & Plan Note (Signed)
With a mild acute flare, some low pitched wheezing on exam.  This in the setting of an upper respiratory infection.  Please continue Breo once a day Take prednisone as directed until completely gone Take azithromycin until completely gone

## 2017-06-05 NOTE — Assessment & Plan Note (Signed)
Chronic hypoxemic respiratory failure.  Recheck of her saturations documents that she desaturated to 81% on room air at rest today.  We will order new equipment from [place].  She will need a portable oxygen concentrator because she is unable to carry and adequately ambulate with standard tanks due to weakness and to arthritic back disease and back pain.

## 2017-06-10 ENCOUNTER — Telehealth: Payer: Self-pay | Admitting: Emergency Medicine

## 2017-06-10 NOTE — Telephone Encounter (Signed)
Spoke with [name]. States that her [employer] systems are being denied by Medicare. When she was here on 06/07/17, she was requalified for her In[employer] systems. At this OV she was also sick and was treated for a flare up. Due to this Medicare is denying oxygen coverage. [name] will have to have to another OV, walk test and another order placed for oxygen. Per Medicare, [name] has to wait 14 days from her last OV before another walk test could be performed. She has been scheduled with TP on 06/21/17 at 10:30am. Nothing further was needed.

## 2017-06-21 ENCOUNTER — Encounter: Payer: Self-pay | Admitting: Adult Health

## 2017-06-21 ENCOUNTER — Ambulatory Visit (INDEPENDENT_AMBULATORY_CARE_PROVIDER_SITE_OTHER): Payer: Medicare Other | Admitting: Adult Health

## 2017-06-21 VITALS — BP 118/68 | HR 79 | Ht 62.0 in | Wt 158.0 lb

## 2017-06-21 DIAGNOSIS — J9611 Chronic respiratory failure with hypoxia: Secondary | ICD-10-CM | POA: Diagnosis not present

## 2017-06-21 DIAGNOSIS — J45909 Unspecified asthma, uncomplicated: Secondary | ICD-10-CM | POA: Diagnosis not present

## 2017-06-21 DIAGNOSIS — I272 Pulmonary hypertension, unspecified: Secondary | ICD-10-CM

## 2017-06-21 NOTE — Patient Instructions (Signed)
Continue on current regimen .  Continue on Oxygen 3l/m .  Order for portable oxygen .  Follow up with Dr. Lamonte Sakai  In 3 months and.As needed   Please contact office for sooner follow up if symptoms do not improve or worsen or seek emergency care

## 2017-06-21 NOTE — Progress Notes (Signed)
@Patient  ID: [name], female    DOB: 1950-12-19, 67 y.o.   MRN: [id number]  No chief complaint on file.   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  Patient was seen last visit for a upper respiratory infection and asthma flare She was treated with a Z-Pak and a prednisone taper. Patient says that she is feeling improved with decreased cough and shortness of breath. Patient is on oxygen at 3 L.  She says her oxygen helps her with her shortness of breath.  Today patient's oxygen was 87% on room air.  At 3 L patient was able to keep her oxygen level above 90% .  Allergies  Allergen Reactions  . Iodine Anaphylaxis  . Shellfish Allergy Anaphylaxis  . Iohexol      Code: HIVES, Desc: reaction since childhood, Onset Date: [id number]   . Levaquin [Levofloxacin In D5w] Nausea And Vomiting    dizziness    Immunization History  Administered Date(s) Administered  . Influenza Split 02/26/2012  . Influenza Whole 02/25/2009  . Influenza, High Dose Seasonal PF 02/26/2016  . Influenza,inj,Quad PF,6+ Mos 04/27/2013, 02/28/2015  . Influenza-Unspecified 03/03/2014, 03/28/2017  . Pneumococcal Conjugate-13 03/26/2014  . Pneumococcal Polysaccharide-23 05/28/2006, 04/08/2012    Past Medical History:  Diagnosis Date  . Allergic rhinitis   . Anemia   . Asthma   . Asthma   . Atrial fibrillation ([place])    s/p RFA and Maze procedure - resolved  . Atrial tachycardia ([place])   . Complication of anesthesia    trouble waking up  . Depression   . Dyspnea   . Exercise hypoxemia    supplemental O2 PRN  . Fatigue   . Gastritis    NSAID induced  . GERD (gastroesophageal reflux disease)   . Heart murmur    severe MR s/p MV repair  . Hx of asplenia    surgical  . Hypertension    . 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 Myoclonus   . Osteoporosis   . Pulmonary HTN ([place])    mild to moderate by [place] 05/2012  . 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 Syncope and collapse 2012   r/t anemia  . Trochanteric bursitis    left hip    Tobacco History: Social History   Tobacco Use  Smoking Status Never Smoker  Smokeless Tobacco Never Used   Counseling given: Not Answered   Outpatient Encounter Medications as of 06/21/2017  Medication Sig  . albuterol (PROVENTIL HFA;VENTOLIN HFA) 108 (90 Base) MCG/ACT inhaler Inhale 2 puffs into the lungs every 6 (six) hours as needed for wheezing or shortness of breath.  Marland Kitchen augmented betamethasone dipropionate (DIPROLENE-AF) 0.05 % ointment Apply 1 application topically daily as needed for rash.  Marland Kitchen BREO ELLIPTA 100-25 MCG/INH AEPB INHALE 1 PUFF BY MOUTH EVERY DAY  . calcium-vitamin D (OSCAL WITH D) 500-200 MG-UNIT tablet Take 1 tablet by mouth 2 (two) times daily.  . chlorpheniramine (CHLOR-TRIMETON) 4 MG tablet Take 4 mg by mouth at bedtime.  . cholecalciferol (VITAMIN D) 1000 UNITS tablet Take 1,000 Units by mouth 2 (two) times daily.   . clonazePAM (KLONOPIN) 1 MG tablet Take 2 mg by mouth at bedtime.   Marland Kitchen deferasirox (EXJADE) 500 MG  disintegrating tablet Take 3 tablets (1,500 mg total) by mouth daily before breakfast. Faxed to [employer] [phone]  . dextromethorphan (DELSYM) 30 MG/5ML liquid Take 60 mg by mouth at bedtime as needed for cough.  . diclofenac sodium (VOLTAREN) 1 % GEL Apply 2 g topically 4 (four) times daily as needed. (Patient taking differently: Apply 2 g topically 4 (four) times daily as needed (for pain). )  . diltiazem (CARDIZEM CD) 240 MG 24 hr capsule TAKE 1 CAPSULE (240 MG TOTAL) BY MOUTH DAILY.  Marland Kitchen ELIQUIS 5 MG TABS tablet TAKE 1 TABLET BY MOUTH TWICE A DAY  . EPINEPHrine (EPI-PEN) 0.3 mg/0.3 mL DEVI Inject 0.3 mg into the  muscle once as needed (for allergic reaction).   Marland Kitchen escitalopram (LEXAPRO) 10 MG tablet Take 10 mg by mouth at bedtime.   . fexofenadine (ALLEGRA) 180 MG tablet Take 180 mg by mouth daily.   . fluticasone (FLONASE) 50 MCG/ACT nasal spray Place 1 spray into both nostrils daily.  . folic acid (FOLVITE) 284 MCG tablet Take 400 mcg by mouth daily.  . Lactobacillus Rhamnosus, GG, (CULTURELLE) CAPS Take 1 capsule by mouth daily.  . metolazone (ZAROXOLYN) 2.5 MG tablet Take 1 tablet (2.5 mg total) by mouth as needed.  . metoprolol succinate (TOPROL-XL) 50 MG 24 hr tablet Take 1 tablet (50 mg total) by mouth 2 (two) times daily. Take with or immediately following a meal.  . Multiple Vitamins-Minerals (A[place]NE) TBEF Take 1 tablet by mouth 2 (two) times daily. *Dissolves in water twice a day*  . OXYGEN Inhale 3 L into the lungs continuous.   . pantoprazole (PROTONIX) 40 MG tablet Take 40 mg by mouth daily before breakfast.   . potassium chloride SA (KLOR-CON M20) 20 MEQ tablet Take 3 tablets (60 mEq total) 3 (three) times daily by mouth.  . predniSONE (DELTASONE) 20 MG tablet Take 2 tablets (40 mg total) by mouth daily with breakfast.  . raloxifene (EVISTA) 60 MG tablet Take 60 mg by mouth daily with breakfast.   . senna-docusate (SENOKOT-S) 8.6-50 MG tablet Take 1 tablet by mouth at bedtime as needed for mild constipation.  . sodium chloride (OCEAN) 0.65 % SOLN nasal spray Place 1 spray into both nostrils 2 (two) times daily.   Marland Kitchen spironolactone (ALDACTONE) 25 MG tablet Take 1 tablet (25 mg total) by mouth daily.  Marland Kitchen torsemide (DEMADEX) 20 MG tablet TAKE 2 TABLETS (40 MG TOTAL) BY MOUTH 2 (TWO) TIMES DAILY.  . traMADol (ULTRAM) 50 MG tablet Take 1-2 tablets (50-100 mg total) by mouth 3 (three) times daily. Take 100 mg in the morning, 50mg s in the afternoon, and 100 mgs at bedtime  . [DISCONTINUED] diltiazem (CARTIA XT) 240 MG 24 hr capsule Take 1 capsule (240 mg total) by mouth daily with breakfast.  .  [DISCONTINUED] KLOR-CON M20 20 MEQ tablet TAKE 3 TABLETS (60 MEQ TOTAL) BY MOUTH 2 (TWO) TIMES DAILY.   No facility-administered encounter medications on file as of 06/21/2017.      Review of Systems  Constitutional:   No  weight loss, night sweats,  Fevers, chills,  +fatigue, or  lassitude.  HEENT:   No headaches,  Difficulty swallowing,  Tooth/dental problems, or  Sore throat,                No sneezing, itching, ear ache,  =nasal congestion, post nasal drip,   CV:  No chest pain,  Orthopnea, PND, swelling in lower extremities, anasarca, dizziness, palpitations, syncope.  GI  No heartburn, indigestion, abdominal pain, nausea, vomiting, diarrhea, change in bowel habits, loss of appetite, bloody stools.   Resp:    No chest wall deformity  Skin: no rash or lesions.  GU: no dysuria, change in color of urine, no urgency or frequency.  No flank pain, no hematuria   MS:  No joint pain or swelling.  No decreased range of motion.  No back pain.    Physical Exam  BP 118/68 (BP Location: Left Arm, Cuff Size: Normal)   Pulse 79   Ht 5\' 2"  (1.575 m)   Wt 158 lb (71.7 kg)   SpO2 99%   BMI 28.90 kg/m   GEN: A/Ox3; pleasant , NAD, elderly   HEENT:  [place]/AT,  EACs-clear, TMs-wnl, NOSE-clear, THROAT-clear, no lesions, no postnasal drip or exudate noted.   NECK:  Supple w/ fair ROM; no JVD; normal carotid impulses w/o bruits; no thyromegaly or nodules palpated; no lymphadenopathy.    RESP decreased breath sounds in the bases  no accessory muscle use, no dullness to percussion  CARD:  RRR, no m/r/g, no peripheral edema, pulses intact, no cyanosis or clubbing.  GI:   Soft & nt; nml bowel sounds; no organomegaly or masses detected.   Musco: Warm bil, no deformities or joint swelling noted.   Neuro: alert, no focal deficits noted.    Skin: Warm, no lesions or rashes    Lab Results:  CBC  BMET  BNP  No results found.   Assessment & Plan:   Asthma, intrinsic Recent  exacerbation now resolved.  Patient is doing well.  Continue on her current regimen  Chronic respiratory failure ([place]) Compensated on oxygen.  Patient is continue on oxygen at 3 L.     Rexene Edison, NP 06/21/2017

## 2017-06-21 NOTE — Assessment & Plan Note (Signed)
Recent exacerbation now resolved.  Patient is doing well.  Continue on her current regimen

## 2017-06-21 NOTE — Assessment & Plan Note (Signed)
Compensated on oxygen.  Patient is continue on oxygen at 3 L.

## 2017-07-09 NOTE — Telephone Encounter (Signed)
Thanks

## 2017-07-09 NOTE — Telephone Encounter (Signed)
Oral Oncology Patient Advocate Encounter  Received notification from [employer] Patient Assistance program that patient has been successfully re-enrolled into their program to continue to receive Exjade from the manufacturer at $0 out of pocket until 05/27/2018.   Prescriptions can continue to be faxed to [employer] at [phone]  I called and spoke with patient.  She knows to call the office with questions or concerns.  Oral Oncology Clinic will continue to follow.  Gilmore Laroche, CPhT, [facility] Patient Advocate [phone] 07/09/2017 12:51 PM

## 2017-07-17 ENCOUNTER — Inpatient Hospital Stay: Payer: Medicare Other | Attending: Oncology

## 2017-07-17 DIAGNOSIS — D552 Anemia due to disorders of glycolytic enzymes: Secondary | ICD-10-CM | POA: Diagnosis not present

## 2017-07-17 DIAGNOSIS — D594 Other nonautoimmune hemolytic anemias: Secondary | ICD-10-CM

## 2017-07-17 LAB — CBC WITH DIFFERENTIAL/PLATELET
Basophils Absolute: 0.1 10*3/uL (ref 0.0–0.1)
Basophils Relative: 1 %
EOS ABS: 0.5 10*3/uL (ref 0.0–0.5)
EOS PCT: 4 %
HCT: 26.7 % — ABNORMAL LOW (ref 34.8–46.6)
Hemoglobin: 8.2 g/dL — ABNORMAL LOW (ref 11.6–15.9)
LYMPHS ABS: 2.9 10*3/uL (ref 0.9–3.3)
LYMPHS PCT: 26 %
MCH: 36 pg — AB (ref 25.1–34.0)
MCHC: 30.7 g/dL — AB (ref 31.5–36.0)
MCV: 117.1 fL — AB (ref 79.5–101.0)
MONOS PCT: 19 %
Monocytes Absolute: 2.1 10*3/uL — ABNORMAL HIGH (ref 0.1–0.9)
Neutro Abs: 5.7 10*3/uL (ref 1.5–6.5)
Neutrophils Relative %: 50 %
PLATELETS: 510 10*3/uL — AB (ref 145–400)
RBC: 2.28 MIL/uL — ABNORMAL LOW (ref 3.70–5.45)
RDW: 15.8 % — ABNORMAL HIGH (ref 11.2–14.5)
WBC: 11.3 10*3/uL — ABNORMAL HIGH (ref 3.9–10.3)

## 2017-07-17 LAB — FERRITIN: Ferritin: 49 ng/mL (ref 9–269)

## 2017-07-18 ENCOUNTER — Telehealth: Payer: Self-pay | Admitting: *Deleted

## 2017-07-18 NOTE — Telephone Encounter (Signed)
Called pt with ferritin result, per MD note below. She voiced appreciation for call.

## 2017-07-18 NOTE — Telephone Encounter (Signed)
-----   Message from Ladell Pier, MD sent at 07/17/2017  1:56 PM EST ----- Please call patient, ferritin looks good, follow-up as scheduled

## 2017-07-22 ENCOUNTER — Encounter: Payer: Self-pay | Admitting: Emergency Medicine

## 2017-07-22 ENCOUNTER — Ambulatory Visit (INDEPENDENT_AMBULATORY_CARE_PROVIDER_SITE_OTHER): Payer: Medicare Other | Admitting: Emergency Medicine

## 2017-07-22 ENCOUNTER — Ambulatory Visit (INDEPENDENT_AMBULATORY_CARE_PROVIDER_SITE_OTHER)
Admission: RE | Admit: 2017-07-22 | Discharge: 2017-07-22 | Disposition: A | Discharge status: Home / Self Care | Payer: Medicare Other | Source: Ambulatory Visit | Attending: Emergency Medicine | Admitting: Emergency Medicine

## 2017-07-22 VITALS — BP 128/72 | HR 88 | Temp 98.4°F | Ht 62.0 in | Wt 161.0 lb

## 2017-07-22 DIAGNOSIS — J301 Allergic rhinitis due to pollen: Secondary | ICD-10-CM

## 2017-07-22 DIAGNOSIS — R05 Cough: Secondary | ICD-10-CM | POA: Diagnosis not present

## 2017-07-22 DIAGNOSIS — G4733 Obstructive sleep apnea (adult) (pediatric): Secondary | ICD-10-CM | POA: Diagnosis not present

## 2017-07-22 DIAGNOSIS — R059 Cough, unspecified: Secondary | ICD-10-CM

## 2017-07-22 MED ORDER — DOXYCYCLINE HYCLATE 100 MG PO TABS: 100.0000 mg | ORAL_TABLET | Freq: Two times a day (BID) | ORAL | 0 refills | Status: DC

## 2017-07-22 MED ORDER — PREDNISONE 10 MG PO TABS: ORAL_TABLET | ORAL | 0 refills | Status: DC

## 2017-07-22 NOTE — Assessment & Plan Note (Signed)
With an acute exacerbation, wheeze that appears to be focal on the right.  We will treat with doxycycline, prednisone taper.  Check chest x-ray to evaluate for possible mild pneumonia given her focal exam.

## 2017-07-22 NOTE — Assessment & Plan Note (Signed)
Good compliance with CPAP.  Continue same 

## 2017-07-22 NOTE — Progress Notes (Signed)
Subjective:    Patient ID: [name], female    DOB: 1950-08-19, 67 y.o.   MRN: [id number]  Asthma  She complains of cough, shortness of breath and wheezing. Associated symptoms include postnasal drip. Pertinent negatives include no ear pain, fever, headaches, rhinorrhea, sneezing, sore throat or trouble swallowing. Her past medical history is significant for asthma.    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 Also with diastolic CHF, atrial fibrillation, history of mitral valve repair. Secondary pulmonary hypertension due to all of the above. He has had some increased edema, increased weight, diuresis increased by cardiology 09/11/16.  She notes that her dyspnea has improved with the increase in her diuresis. The edema was mostly abdominal and in her neck. She still has exertional dyspnea and has seen exertional desats on 2L/min. She was treated for AE's in January and February, CAP and pleuritic pain. Not resolved. Currently no wheeze, cough. She does have hoarse voice. She is on allegra and flonase qd, chlorpheniramine qhs.   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 Also followed elsewhere for diastolic CHF, atrial fibrillation, history mitral valve repair and secondary pulmonary hypertension. She is here today reporting that she has been having problems since 1 week ago > increased nasal congestion and drainage, led to wheeze, productive cough. She remains on breo, has tried her albuterol without a lot of change. She has chest and throat noise. Her wt has been stable. No real fever. She is on allegra, flonase, chlorpheniramine qhs.   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 She also has diastolic CHF, atrial fibrillation, mitral valve disease with associated secondary pulmonary hypertension. I saw her one week ago with what sounded like a flare of her upper airway irritation associated with an acute bronchitis. I treated her with azithromycin. She only received partial prescription for prednisone taper. She tells me today that she is doing better, her mucous is starting to become more clear. She is taking her flonase once a day, wonders about going to bid during the Fall / Spring.   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  Also with a history of secondary pulmonary hypertension, diastolic CHF in the setting of atrial fibrillation, hypertension, mitral valve disease.  She uses an [employer] oxygen system both in house and portable concentrator's.  She needs maintenance on these.  Currently managed on Breo, Flonase, Allegra, chlorpheniramine.  Uses albuterol rarely until recent URI, has been using more frequently since then. Has had more cough, some wheeze for last 2 days. She will need to be requalified for her O2 today.   Acute OV 07/22/17 --this is an acute visit.  Patient is 30 with a history of obstructive sleep apnea on CPAP, mixed restriction and obstruction by pulmonary function testing (paralyzed hemidiaphragm).  Upper airway irritation with chronic allergic rhinitis.  She has secondary pulmonary hypertension in the setting of this as well as hypertension, diastolic CHF, atrial fibrillation.  She presents today reporting that she had been better since January when I treated her for AE. She began to have new sx about 1  weeks ago > dry cough, then developed aches, some low grade fever, clear drainage. Then developed some wheeze, yellow mucous. She is taking her allergy regimen reliably.    Review of  Systems  Constitutional: Negative for fever and unexpected weight change.  HENT: Positive for congestion and postnasal drip. Negative for dental problem, ear pain, nosebleeds, rhinorrhea, sinus pressure, sneezing, sore throat and trouble swallowing.   Eyes: Negative for redness and itching.  Respiratory: Positive for cough, shortness of breath and wheezing. Negative for chest tightness.   Cardiovascular: Negative for palpitations and leg swelling.  Gastrointestinal: Negative for nausea and vomiting.  Genitourinary: Negative for dysuria.  Musculoskeletal: Negative for joint swelling.  Skin: Negative for rash.  Neurological: Negative for headaches.  Hematological: Does not bruise/bleed easily.  Psychiatric/Behavioral: Negative for dysphoric mood. The patient is not nervous/anxious.        Objective:   Physical Exam  Vitals:   07/22/17 1020  BP: 128/72  Pulse: 88  Temp: 98.4 F (36.9 C)  TempSrc: Oral  SpO2: 90%  Weight: 161 lb (73 kg)  Height: 5\' 2"  (1.575 m)  Gen: Pleasant, well-nourished, in no distress,  normal affect  ENT: No lesions,  mouth clear,  oropharynx clear, some postnasal drip, hoarse voice   Neck: No JVD, no TMG, no carotid bruits  Lungs: No use of accessory muscles, some focal coarse wheezes on the right, more clear on the left  Cardiovascular: RRR, 2/6 M  Musculoskeletal: No deformities, no cyanosis or clubbing  Neuro: alert, non focal  Skin: Warm, no lesions or rashes      Assessment & Plan:  Obstructive sleep apnea Good compliance with CPAP.  Continue same.  Allergic rhinitis Continue current stable regimen.  Asthma, intrinsic With an acute exacerbation, wheeze that appears to be focal on the right.  We will treat with doxycycline, prednisone taper.  Check chest x-ray to evaluate for possible mild pneumonia given her focal exam.  Baltazar Apo, MD, PhD 07/22/2017, 10:39 AM [facility] [phone] or if no answer  [phone]

## 2017-07-22 NOTE — Assessment & Plan Note (Signed)
Continue current stable regimen.

## 2017-07-22 NOTE — Patient Instructions (Signed)
Chest x-ray today. Doxycycline 100 mg twice a day for 7 days.  Please take until completely gone. Take prednisone as directed.  Take this until completely gone. Please continue your other medications as you have been taking them. Continue CPAP every night as you have been doing  Follow with Dr Lamonte Sakai in 3 months or sooner if you have any problems.

## 2017-08-07 ENCOUNTER — Other Ambulatory Visit (HOSPITAL_COMMUNITY): Payer: Self-pay | Admitting: Cardiology

## 2017-08-14 ENCOUNTER — Other Ambulatory Visit (HOSPITAL_COMMUNITY): Payer: Self-pay | Admitting: Internal Medicine

## 2017-09-02 ENCOUNTER — Telehealth: Payer: Self-pay | Admitting: Acute Care

## 2017-09-02 ENCOUNTER — Other Ambulatory Visit (INDEPENDENT_AMBULATORY_CARE_PROVIDER_SITE_OTHER): Payer: Medicare Other

## 2017-09-02 ENCOUNTER — Ambulatory Visit (INDEPENDENT_AMBULATORY_CARE_PROVIDER_SITE_OTHER): Payer: Medicare Other | Admitting: Acute Care

## 2017-09-02 ENCOUNTER — Ambulatory Visit: Payer: Medicare Other | Admitting: Emergency Medicine

## 2017-09-02 ENCOUNTER — Encounter: Payer: Self-pay | Admitting: Acute Care

## 2017-09-02 ENCOUNTER — Ambulatory Visit (INDEPENDENT_AMBULATORY_CARE_PROVIDER_SITE_OTHER)
Admission: RE | Admit: 2017-09-02 | Discharge: 2017-09-02 | Disposition: A | Discharge status: Home / Self Care | Payer: Medicare Other | Source: Ambulatory Visit | Attending: Acute Care | Admitting: Acute Care

## 2017-09-02 VITALS — BP 102/76 | HR 111 | Temp 99.2°F | Ht 62.0 in | Wt 157.0 lb

## 2017-09-02 DIAGNOSIS — R05 Cough: Secondary | ICD-10-CM

## 2017-09-02 DIAGNOSIS — R059 Cough, unspecified: Secondary | ICD-10-CM

## 2017-09-02 DIAGNOSIS — J181 Lobar pneumonia, unspecified organism: Secondary | ICD-10-CM | POA: Diagnosis not present

## 2017-09-02 DIAGNOSIS — J189 Pneumonia, unspecified organism: Secondary | ICD-10-CM

## 2017-09-02 LAB — CBC
HEMATOCRIT: 25.8 % — AB (ref 36.0–46.0)
Hemoglobin: 8.4 g/dL — ABNORMAL LOW (ref 12.0–15.0)
MCHC: 32.6 g/dL (ref 30.0–36.0)
MCV: 112.9 fl — AB (ref 78.0–100.0)
Platelets: 544 10*3/uL — ABNORMAL HIGH (ref 150.0–400.0)
RBC: 2.29 Mil/uL — ABNORMAL LOW (ref 3.87–5.11)
RDW: 14.1 % (ref 11.5–15.5)
WBC: 21.1 10*3/uL (ref 4.0–10.5)

## 2017-09-02 LAB — BASIC METABOLIC PANEL
BUN: 22 mg/dL (ref 6–23)
CALCIUM: 9.7 mg/dL (ref 8.4–10.5)
CO2: 31 meq/L (ref 19–32)
CREATININE: 1.12 mg/dL (ref 0.40–1.20)
Chloride: 96 mEq/L (ref 96–112)
GFR: 51.27 mL/min — ABNORMAL LOW (ref >60.00)
GLUCOSE: 116 mg/dL — AB (ref 70–99)
Potassium: 4.4 mEq/L (ref 3.5–5.1)
Sodium: 134 mEq/L — ABNORMAL LOW (ref 135–145)

## 2017-09-02 MED ORDER — PREDNISONE 10 MG PO TABS: ORAL_TABLET | ORAL | 0 refills | Status: DC

## 2017-09-02 MED ORDER — AMOXICILLIN-POT CLAVULANATE 875-125 MG PO TABS: 1.0000 | ORAL_TABLET | Freq: Two times a day (BID) | ORAL | 0 refills | Status: DC

## 2017-09-02 MED ORDER — HYDROCODONE-HOMATROPINE 5-1.5 MG/5ML PO SYRP: 5.0000 mL | ORAL_SOLUTION | Freq: Four times a day (QID) | ORAL | 0 refills | Status: DC | PRN

## 2017-09-02 NOTE — Assessment & Plan Note (Addendum)
Acute URI with fever and leukocytosis Plan: CXR today.>> RML pneumonia We will call results CBC, BMET today>> WBC 21.1 We will call results Augmentin 875 mg twice daily x 7 days. Hydromet cough syrup 5 cc's at bedtime for cough. Continue Delsym every 12 hours as you have been doing. Prednisone taper; 10 mg tablets: 3 tabs x 2 days, 2 tabs x 2 days, 1 tabs x 2 days and then stop. Continue Breo daily as you have been doing Rinse mouth after use Continue Allegra and Flonase as you have been doing. Follow up in 2 weeks with Dr. Lamonte Sakai or Judson Roch NP to ensure better. Please contact office for sooner follow up if symptoms do not improve or worsen or seek emergency care    Consider Singulair at follow up for worsening allergies.

## 2017-09-02 NOTE — Patient Instructions (Addendum)
We will do a Flu Swab CXR today. We will call results CBC, BMET today>> WBC 21.1 We will call results Augmentin 875 mg twice daily x 7 days. Hydromet cough syrup 5 cc's at bedtime for cough. Continue Delsym every 12 hours as you have been doing. Prednisone taper; 10 mg tablets: 3 tabs x 2 days, 2 tabs x 2 days, 1 tabs x 2 days and then stop. Continue Breo daily as you have been doing Rinse mouth after use Continue Allegra and Flonase as you have been doing. Follow up in 2 weeks with Dr. Lamonte Sakai or Judson Roch NP to ensure better. Please contact office for sooner follow up if symptoms do not improve or worsen or seek emergency care    Consider Singulair at follow up for worsening allergies.

## 2017-09-02 NOTE — Telephone Encounter (Signed)
Please call patient and let her know her CXR   does show right middle lobe pneumonia. Have her finish the antibiotic we filled for her today and to follow up as scheduled in 2 weeks.  Please place an order for follow up CXR prior to the follow up visit. Have her  contact office for sooner follow up if symptoms do not improve or worsen or seek emergency care. Thanks so much.

## 2017-09-02 NOTE — Progress Notes (Signed)
History of Present Illness [name] is a 67 y.o. 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  Upper airway irritation with chronic allergic rhinitis.  She has secondary pulmonary hypertension in the setting of this as well as hypertension, diastolic CHF, atrial fibrillation. She is followed by Dr. Lamonte [name].  Last flu shot 03/2017  09/02/2017 Acute OV Pt. Presents for sick visit. She states she has had fever, chills, nausea for the last 4 days ( Since Friday 08/30/2017). This  has been associated productive cough with green secretions. She has a lot of drainage, and she has nasal secretions that are also green.Temp is 99 in the office today , patient states she did take tylenol this am at 9. She states she has had a hard time with her secretions. She is compliant with her nasal saline daily. She states this does not feel like sinusitis.She takes Allegra, Flonase for her allergies and she takes Breo for her asthma. She states she has had a much worse time with her allergies this Spring. She does not recollect a sick contact.She has been going to Dr. Jess [name] office once weekly with her husband after he had recent  foot surgery.She has been using Delsym.She started an old prescription of Augmentin on Saturday. She took it Saturday x 1 dose and ,Sunday x 2 doses. She states she has never done this before.She has been compliant with her 3 L oxygen 24/7. She has been using her humidified oxygen.She denies chest pain, orthopnea or hemoptysis.  Test Results: CXR 09/02/2017>>  Right middle lobe pneumonia. Followup PA and lateral chest X-ray is recommended in 3-4 weeks following trial of antibiotic therapy to ensure resolution.  09/02/2017>> WBC count: 21.1  09/02/2017>> Flu Swab : Negative  CBC Latest Ref Rng & Units 07/17/2017 04/16/2017 03/27/2017  WBC 3.9 - 10.3 K/uL 11.3(H) 10.1 21.4(H)  Hemoglobin 11.6 - 15.9  g/dL 8.2(L) 7.8(L) 8.7(L)  Hematocrit 34.8 - 46.6 % 26.7(L) 25.6(L) 28.0(L)  Platelets 145 - 400 K/uL 510(H) 499(H) 478(H)    BMP Latest Ref Rng & Units 03/27/2017 02/19/2017 02/07/2017  Glucose 65 - 99 mg/dL 103(H) 106(H) 104(H)  BUN 6 - 20 mg/dL 41(H) 28(H) 37(H)  Creatinine 0.44 - 1.00 mg/dL 1.74(H) 1.09(H) 1.26(H)  Sodium 135 - 145 mmol/L 135 134(L) 133(L)  Potassium 3.5 - 5.1 mmol/L 4.7 4.5 3.0(L)  Chloride 101 - 111 mmol/L 98(L) 101 91(L)  CO2 22 - 32 mmol/L 28 29 34(H)  Calcium 8.9 - 10.3 mg/dL 9.3 9.3 9.6    BNP    Component Value Date/Time   BNP 878.0 (H) 09/11/2016 1209    ProBNP    Component Value Date/Time   PROBNP 936.3 (H) 09/21/2013 0909    PFT    Component Value Date/Time   FEV1PRE 0.96 11/01/2016 1053   FEV1POST 1.02 11/01/2016 1053   FVCPRE 1.33 11/01/2016 1053   FVCPOST 1.42 11/01/2016 1053   TLC 3.04 11/01/2016 1053   DLCOUNC 6.52 11/01/2016 1053   PREFEV1FVCRT 72 11/01/2016 1053   PSTFEV1FVCRT 71 11/01/2016 1053    Dg Chest 2 View  Result Date: 09/02/2017 CLINICAL DATA:  Cough and congestion.  Fever. EXAM: CHEST - 2 VIEW COMPARISON:  07/22/2017 FINDINGS: Chronic cardiomegaly and aortic tortuosity. Right middle lobe opacity with a focal consolidative appearance on the lateral view. No visible effusion or cavitation. Negative for pulmonary edema. IMPRESSION: Right middle lobe pneumonia. Followup PA and lateral chest X-ray is recommended  in 3-4 weeks following trial of antibiotic therapy to ensure resolution. Electronically Signed   By: Monte Fantasia M.D.   On: 09/02/2017 11:34     Past medical hx Past Medical History:  Diagnosis Date  . Allergic rhinitis   . Anemia   . Asthma   . Asthma   . Atrial fibrillation ([place])    s/p RFA and Maze procedure - resolved  . Atrial tachycardia ([place])   . Complication of anesthesia    trouble waking up  . Depression   . Dyspnea   . Exercise hypoxemia    supplemental O2 PRN  . Fatigue   . Gastritis     NSAID induced  . GERD (gastroesophageal reflux disease)   . Heart murmur    severe MR s/p MV repair  . Hx of asplenia    surgical  . Hypertension   . 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 Myoclonus   . Osteoporosis   . Pulmonary HTN ([place])    mild to moderate by [place] 05/2012  . 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 Syncope and collapse 2012   r/t anemia  . Trochanteric bursitis    left hip     Social History   Tobacco Use  . Smoking status: Never Smoker  . Smokeless tobacco: Never Used  Substance Use Topics  . Alcohol use: Yes    Comment: socially glass red wine monthly  . Drug use: No    Ms.[name] reports that she has never smoked. She has never used smokeless tobacco. She reports that she drinks alcohol. She reports that she does not use drugs.  Tobacco Cessation: Never smoker  Past surgical hx, Family hx, Social hx all reviewed.  Current Outpatient Medications on File Prior to Visit  Medication Sig  . albuterol (PROVENTIL HFA;VENTOLIN HFA) 108 (90 Base) MCG/ACT inhaler Inhale 2 puffs into the lungs every 6 (six) hours as needed for wheezing or shortness of breath.  Marland Kitchen augmented betamethasone dipropionate (DIPROLENE-AF) 0.05 % ointment Apply 1 application topically daily as needed for rash.  Marland Kitchen BREO ELLIPTA 100-25 MCG/INH AEPB INHALE 1 PUFF BY MOUTH EVERY DAY  . calcium-vitamin D (OSCAL WITH D) 500-200 MG-UNIT tablet Take 1 tablet by mouth 2 (two) times daily.  . chlorpheniramine (CHLOR-TRIMETON) 4 MG tablet Take 4 mg by mouth at bedtime.  . cholecalciferol (VITAMIN D) 1000 UNITS tablet Take 1,000 Units by mouth 2 (two) times daily.   . clonazePAM (KLONOPIN) 1 MG tablet Take 2 mg by mouth at bedtime.   Marland Kitchen deferasirox (EXJADE) 500 MG disintegrating tablet Take 3 tablets (1,500 mg total) by mouth daily before breakfast. Faxed to [employer] [phone]  . dextromethorphan (DELSYM) 30 MG/5ML  liquid Take 60 mg by mouth at bedtime as needed for cough.  . diclofenac sodium (VOLTAREN) 1 % GEL Apply 2 g topically 4 (four) times daily as needed. (Patient taking differently: Apply 2 g topically 4 (four) times daily as needed (for pain). )  . diltiazem (CARDIZEM CD) 240 MG 24 hr capsule TAKE 1 CAPSULE (240 MG TOTAL) BY MOUTH DAILY.  Marland Kitchen ELIQUIS 5 MG TABS tablet TAKE 1 TABLET BY MOUTH TWICE A DAY  . EPINEPHrine (EPI-PEN) 0.3 mg/0.3 mL DEVI Inject 0.3 mg into the muscle once as needed (for allergic reaction).   Marland Kitchen escitalopram (LEXAPRO) 10 MG tablet Take 10 mg by mouth at bedtime.   . fexofenadine (ALLEGRA) 180 MG tablet Take  180 mg by mouth daily.   . fluticasone (FLONASE) 50 MCG/ACT nasal spray Place 1 spray into both nostrils daily.  . folic acid (FOLVITE) 191 MCG tablet Take 400 mcg by mouth daily.  . Lactobacillus Rhamnosus, GG, (CULTURELLE) CAPS Take 1 capsule by mouth daily.  . metolazone (ZAROXOLYN) 2.5 MG tablet Take 1 tablet (2.5 mg total) by mouth as needed.  . metoprolol succinate (TOPROL-XL) 50 MG 24 hr tablet TAKE 1 TABLET (50 MG TOTAL) BY MOUTH 2 (TWO) TIMES DAILY. TAKE WITH OR IMMEDIATELY FOLLOWING A MEAL.  . Multiple Vitamins-Minerals (AIRBORNE) TBEF Take 1 tablet by mouth 2 (two) times daily. *Dissolves in water twice a day*  . OXYGEN Inhale 3 L into the lungs continuous.   . pantoprazole (PROTONIX) 40 MG tablet Take 40 mg by mouth daily before breakfast.   . potassium chloride SA (KLOR-CON M20) 20 MEQ tablet Take 3 tablets (60 mEq total) 3 (three) times daily by mouth.  . raloxifene (EVISTA) 60 MG tablet Take 60 mg by mouth daily with breakfast.   . senna-docusate (SENOKOT-S) 8.6-50 MG tablet Take 1 tablet by mouth at bedtime as needed for mild constipation.  . sodium chloride (OCEAN) 0.65 % SOLN nasal spray Place 1 spray into both nostrils 2 (two) times daily.   Marland Kitchen spironolactone (ALDACTONE) 25 MG tablet Take 1 tablet (25 mg total) by mouth daily.  Marland Kitchen torsemide (DEMADEX) 20 MG  tablet TAKE 2 TABLETS (40 MG TOTAL) BY MOUTH 2 (TWO) TIMES DAILY.  . traMADol (ULTRAM) 50 MG tablet Take 1-2 tablets (50-100 mg total) by mouth 3 (three) times daily. Take 100 mg in the morning, 50mg s in the afternoon, and 100 mgs at bedtime   No current facility-administered medications on file prior to visit.      Allergies  Allergen Reactions  . Iodine Anaphylaxis  . Shellfish Allergy Anaphylaxis  . Iohexol      Code: HIVES, Desc: reaction since childhood, Onset Date: [id number]   . Levaquin [Levofloxacin In D5w] Nausea And Vomiting    dizziness    Review Of Systems:  Constitutional:   No  weight loss, night sweats,  +Fevers,+ chills, +fatigue, or  lassitude.  HEENT:   No headaches,  Difficulty swallowing,  Tooth/dental problems, or  Sore throat,                No sneezing, itching, ear ache, nasal congestion, post nasal drip,   CV:  No chest pain,  Orthopnea, PND, swelling in lower extremities, anasarca, dizziness, palpitations, syncope.   GI  No heartburn, indigestion, abdominal pain, nausea, vomiting, diarrhea, change in bowel habits, loss of appetite, bloody stools.   Resp: + shortness of breath with exertion or at rest.  + excess mucus, + productive cough,  No non-productive cough,  No coughing up of blood.  + change in color of mucus.  + wheezing.  No chest wall deformity  Skin: no rash or lesions.  GU: no dysuria, change in color of urine, no urgency or frequency.  No flank pain, no hematuria   MS:  No joint pain or swelling.  No decreased range of motion.  No back pain.  Psych:  No change in mood or affect. No depression or anxiety.  No memory loss.   Vital Signs BP 102/76 (BP Location: Left Arm, Cuff Size: Normal)   Pulse (!) 111   Temp 99.2 F (37.3 C)   Ht 5\' 2"  (1.575 m)   Wt 157 lb (71.2 kg)   SpO2  97%   BMI 28.72 kg/m    Physical Exam:  General- No distress,  A&Ox3, pleasant ENT: No sinus tenderness, TM clear, pale nasal mucosa, no oral exudate,no  post nasal drip, no LAN Cardiac: S1, S2, regular rate and rhythm, no murmur Chest: No wheeze/ rales/ dullness; no accessory muscle use, no nasal flaring, no sternal retractions, diminished per right side Abd.: Soft Non-tender, ND Obese Ext: No clubbing cyanosis, edema Neuro:  normal strength Skin: No rashes, warm and dry Psych: normal mood and behavior   Assessment/Plan  CAP (community acquired pneumonia) Acute URI with fever and leukocytosis Plan: CXR today.>> RML pneumonia We will call results CBC, BMET today>> WBC 21.1 We will call results Augmentin 875 mg twice daily x 7 days. Hydromet cough syrup 5 cc's at bedtime for cough. Continue Delsym every 12 hours as you have been doing. Prednisone taper; 10 mg tablets: 3 tabs x 2 days, 2 tabs x 2 days, 1 tabs x 2 days and then stop. Continue Breo daily as you have been doing Rinse mouth after use Continue Allegra and Flonase as you have been doing. Follow up in 2 weeks with Dr. Lamonte [name] or Judson Roch NP to ensure better. Please contact office for sooner follow up if symptoms do not improve or worsen or seek emergency care    Consider Singulair at follow up for worsening allergies.      Magdalen Spatz, NP 09/02/2017  2:26 PM

## 2017-09-02 NOTE — Telephone Encounter (Signed)
Called patients about CXR /recs. Patient verbalized understanding placed order for f/u CXR  nothing further needed at this time.

## 2017-09-13 ENCOUNTER — Emergency Department (HOSPITAL_COMMUNITY)
Admission: EM | Admit: 2017-09-13 | Discharge: 2017-09-14 | Disposition: A | Discharge status: Home / Self Care | Payer: Medicare Other | Attending: Emergency Medicine | Admitting: Emergency Medicine

## 2017-09-13 ENCOUNTER — Encounter (HOSPITAL_COMMUNITY): Payer: Self-pay | Admitting: Emergency Medicine

## 2017-09-13 ENCOUNTER — Emergency Department (HOSPITAL_COMMUNITY): Payer: Medicare Other

## 2017-09-13 ENCOUNTER — Other Ambulatory Visit: Payer: Self-pay

## 2017-09-13 DIAGNOSIS — S300XXA Contusion of lower back and pelvis, initial encounter: Secondary | ICD-10-CM

## 2017-09-13 DIAGNOSIS — I5032 Chronic diastolic (congestive) heart failure: Secondary | ICD-10-CM | POA: Insufficient documentation

## 2017-09-13 DIAGNOSIS — Y939 Activity, unspecified: Secondary | ICD-10-CM | POA: Insufficient documentation

## 2017-09-13 DIAGNOSIS — W01198A Fall on same level from slipping, tripping and stumbling with subsequent striking against other object, initial encounter: Secondary | ICD-10-CM | POA: Diagnosis not present

## 2017-09-13 DIAGNOSIS — I110 Hypertensive heart disease with heart failure: Secondary | ICD-10-CM | POA: Diagnosis not present

## 2017-09-13 DIAGNOSIS — J45909 Unspecified asthma, uncomplicated: Secondary | ICD-10-CM | POA: Diagnosis not present

## 2017-09-13 DIAGNOSIS — E875 Hyperkalemia: Secondary | ICD-10-CM | POA: Diagnosis not present

## 2017-09-13 DIAGNOSIS — Y999 Unspecified external cause status: Secondary | ICD-10-CM | POA: Diagnosis not present

## 2017-09-13 DIAGNOSIS — S3991XA Unspecified injury of abdomen, initial encounter: Secondary | ICD-10-CM | POA: Diagnosis not present

## 2017-09-13 DIAGNOSIS — Y929 Unspecified place or not applicable: Secondary | ICD-10-CM | POA: Diagnosis not present

## 2017-09-13 DIAGNOSIS — S0990XA Unspecified injury of head, initial encounter: Secondary | ICD-10-CM | POA: Diagnosis not present

## 2017-09-13 DIAGNOSIS — I4892 Unspecified atrial flutter: Secondary | ICD-10-CM | POA: Diagnosis not present

## 2017-09-13 DIAGNOSIS — I6782 Cerebral ischemia: Secondary | ICD-10-CM | POA: Insufficient documentation

## 2017-09-13 DIAGNOSIS — W19XXXA Unspecified fall, initial encounter: Secondary | ICD-10-CM

## 2017-09-13 MED ORDER — FENTANYL CITRATE (PF) 100 MCG/2ML IJ SOLN
75.0000 ug | Freq: Once | INTRAMUSCULAR | Status: AC
  Administered 2017-09-14: 75 ug via INTRAVENOUS
  Filled 2017-09-13: qty 2

## 2017-09-13 NOTE — ED Triage Notes (Signed)
Pt comes in with back pain s/p fall. Patient states she fell getting out of the car. Patient states the brick by her house was wet and she slipped and fell and hit her back. Patient states she did not hit her head. Patient complaining of a burning and spasming pain in her left thoracic, flank and back.

## 2017-09-13 NOTE — ED Notes (Signed)
X-ray at bedside

## 2017-09-14 ENCOUNTER — Emergency Department (HOSPITAL_COMMUNITY): Payer: Medicare Other

## 2017-09-14 DIAGNOSIS — S300XXA Contusion of lower back and pelvis, initial encounter: Secondary | ICD-10-CM | POA: Diagnosis not present

## 2017-09-14 DIAGNOSIS — S0990XA Unspecified injury of head, initial encounter: Secondary | ICD-10-CM | POA: Diagnosis not present

## 2017-09-14 LAB — CBC WITH DIFFERENTIAL/PLATELET
BAND NEUTROPHILS: 4 %
Basophils Absolute: 0 10*3/uL (ref 0.0–0.1)
Basophils Relative: 0 %
EOS ABS: 0 10*3/uL (ref 0.0–0.7)
Eosinophils Relative: 0 %
HEMATOCRIT: 27.6 % — AB (ref 36.0–46.0)
HEMOGLOBIN: 8.5 g/dL — AB (ref 12.0–15.0)
LYMPHS ABS: 2.7 10*3/uL (ref 0.7–4.0)
Lymphocytes Relative: 13 %
MCH: 35.3 pg — AB (ref 26.0–34.0)
MCHC: 30.8 g/dL (ref 30.0–36.0)
MCV: 114.5 fL — AB (ref 78.0–100.0)
MYELOCYTES: 2 %
Metamyelocytes Relative: 3 %
Monocytes Absolute: 1.1 10*3/uL — ABNORMAL HIGH (ref 0.1–1.0)
Monocytes Relative: 5 %
NEUTROS ABS: 17.2 10*3/uL — AB (ref 1.7–7.7)
Neutrophils Relative %: 73 %
Platelets: 624 10*3/uL — ABNORMAL HIGH (ref 150–400)
RBC: 2.41 MIL/uL — ABNORMAL LOW (ref 3.87–5.11)
RDW: 14.3 % (ref 11.5–15.5)
WBC: 21 10*3/uL — ABNORMAL HIGH (ref 4.0–10.5)

## 2017-09-14 LAB — COMPREHENSIVE METABOLIC PANEL
ALT: 14 U/L (ref 14–54)
ANION GAP: 6 (ref 5–15)
AST: 24 U/L (ref 15–41)
Albumin: 3.6 g/dL (ref 3.5–5.0)
Alkaline Phosphatase: 80 U/L (ref 38–126)
BILIRUBIN TOTAL: 1.7 mg/dL — AB (ref 0.3–1.2)
BUN: 30 mg/dL — ABNORMAL HIGH (ref 6–20)
CO2: 26 mmol/L (ref 22–32)
CREATININE: 1.29 mg/dL — AB (ref 0.44–1.00)
Calcium: 9.2 mg/dL (ref 8.9–10.3)
Chloride: 106 mmol/L (ref 101–111)
GFR calc Af Amer: 48 mL/min — ABNORMAL LOW (ref >60)
GFR, EST NON AFRICAN AMERICAN: 42 mL/min — AB (ref >60)
Glucose, Bld: 117 mg/dL — ABNORMAL HIGH (ref 65–99)
Potassium: 6 mmol/L — ABNORMAL HIGH (ref 3.5–5.1)
SODIUM: 138 mmol/L (ref 135–145)
TOTAL PROTEIN: 7 g/dL (ref 6.5–8.1)

## 2017-09-14 LAB — URINALYSIS, ROUTINE W REFLEX MICROSCOPIC
BACTERIA UA: NONE SEEN
BILIRUBIN URINE: NEGATIVE
GLUCOSE, UA: NEGATIVE mg/dL
Hgb urine dipstick: NEGATIVE
KETONES UR: NEGATIVE mg/dL
Nitrite: NEGATIVE
PROTEIN: NEGATIVE mg/dL
Specific Gravity, Urine: 1.01 (ref 1.005–1.030)
pH: 7 (ref 5.0–8.0)

## 2017-09-14 MED ORDER — HYDROCODONE-ACETAMINOPHEN 5-325 MG PO TABS
1.0000 | ORAL_TABLET | Freq: Once | ORAL | Status: AC
Start: 2017-09-14 — End: 2017-09-14
  Administered 2017-09-14: 1 via ORAL
  Filled 2017-09-14: qty 1

## 2017-09-14 MED ORDER — HYDROCODONE-ACETAMINOPHEN 5-325 MG PO TABS: 1.0000 | ORAL_TABLET | Freq: Two times a day (BID) | ORAL | 0 refills | Status: DC | PRN

## 2017-09-14 MED ORDER — SODIUM POLYSTYRENE SULFONATE 15 GM/60ML PO SUSP
30.0000 g | Freq: Once | ORAL | Status: AC
  Administered 2017-09-14: 30 g via ORAL
  Filled 2017-09-14: qty 120

## 2017-09-14 MED ORDER — ACETAMINOPHEN ER 650 MG PO TBCR: 650.0000 mg | EXTENDED_RELEASE_TABLET | Freq: Three times a day (TID) | ORAL | 0 refills | Status: AC | PRN

## 2017-09-14 MED ORDER — LIDOCAINE 4 % EX PTCH: 1.0000 | MEDICATED_PATCH | Freq: Two times a day (BID) | CUTANEOUS | 0 refills | Status: DC

## 2017-09-14 MED ORDER — FUROSEMIDE 10 MG/ML IJ SOLN: 20.0000 mg | Freq: Once | INTRAMUSCULAR | Status: DC

## 2017-09-14 MED ORDER — FUROSEMIDE 10 MG/ML IJ SOLN
40.0000 mg | Freq: Once | INTRAMUSCULAR | Status: AC
  Administered 2017-09-14: 40 mg via INTRAVENOUS
  Filled 2017-09-14 (×2): qty 4

## 2017-09-14 MED ORDER — SODIUM CHLORIDE 0.9 % IV SOLN
1.0000 g | Freq: Once | INTRAVENOUS | Status: AC
  Administered 2017-09-14: 1 g via INTRAVENOUS
  Filled 2017-09-14: qty 10

## 2017-09-14 NOTE — Discharge Instructions (Addendum)
We saw you in the ER after you had a fall. All the imaging results are normal, no fractures seen. No evidence of brain bleed. Please be very careful with walking, and do everything possible to prevent falls.  Please stop potassium pills for the next 3 days and call the Cardiology team to see if they want you to stop the medicine or decrease the dose. You potassium level was high in the ER.

## 2017-09-14 NOTE — ED Provider Notes (Signed)
[facility] DEPT Provider Note   CSN: [id number] Arrival date & time: 09/13/17  2050     History   Chief Complaint Chief Complaint  Patient presents with  . Fall  . Back Pain    HPI [name] is a 67 y.o. female.  HPI 67 year old female with multiple medical problems, specifically history of A. fib, pulmonary hypertension on Eliquis and blood dyscrasias comes in with chief complaint of fall.  Patient states that she was trying to get out of the car, when her feet slipped and she had a fall.  Patient struck her body onto concrete surface.  Since then patient has been having severe pain over the left lateral back and hip.  Patient denies any radiation of the pain to her feet.  Patient also denies any pain over the midline/lumbar region.  Patient denies any new numbness, tingling, loss of consciousness, vision change.  Patient's pain is severe and is present even at rest, but it gets worse with any movement.  Patient has taken her home tramadol with no relief.  Past Medical History:  Diagnosis Date  . Allergic rhinitis   . Anemia   . Asthma   . Asthma   . Atrial fibrillation ([place])    s/p RFA and Maze procedure - resolved  . Atrial tachycardia ([place])   . Complication of anesthesia    trouble waking up  . Depression   . Dyspnea   . Exercise hypoxemia    supplemental O2 PRN  . Fatigue   . Gastritis    NSAID induced  . GERD (gastroesophageal reflux disease)   . Heart murmur    severe MR s/p MV repair  . Hx of asplenia    surgical  . Hypertension   . 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 Myoclonus   . Osteoporosis   . Pulmonary HTN ([place])    mild to moderate by [place] 05/2012  . 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 Syncope and collapse 2012   r/t anemia  . Trochanteric bursitis    left hip    Patient Active Problem List   Diagnosis Date Noted  . CAP (community acquired  pneumonia) 09/02/2017  . Hemolytic anemia ([place]) 07/12/2016  . FUO (fever of unknown origin) 07/11/2016  . Fever 07/10/2016  . Acute bronchitis 06/25/2016  . Anemia   . Intractable back pain   . Absolute anemia   . Abdominal pain, acute, generalized 04/02/2016  . Chronic respiratory failure ([place]) 10/12/2014  . Chronic anticoagulation- Eliquis started 08/26/13 09/08/2013  . Normal coronary arteries- 2011 09/08/2013  . Chronic diastolic CHF (congestive heart failure) ([place]) 08/26/2013  . Heart murmur   . Mitral regurgitation- MV repair 9/11   . Exercise hypoxemia   . Pulmonary HTN ([place]) 06/16/2012  .  hemolytic anemia 09/02/2009  . Asthma, intrinsic 09/02/2009  . 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  09/02/2009  . Obstructive sleep apnea 08/03/2009  . DYSPNEA 08/03/2009  . Essential hypertension 08/02/2009  . PAF- recurrent, Maze 9/11 08/02/2009  . Allergic rhinitis 08/02/2009  . GERD 08/02/2009    Past Surgical History:  Procedure Laterality Date  . ABLATION     afib  . CARDIAC CATHETERIZATION N/A 10/29/2014   Procedure: Right Heart Cath;  Surgeon: Larey Dresser, MD;  Location: [facility];  Service: Cardiovascular;  Laterality: N/A;  . CARDIOVERSION N/A 09/11/2013   Procedure: CARDIOVERSION;  Surgeon:  Jolaine Artist, MD;  Location: [facility] ENDOSCOPY;  Service: Cardiovascular;  Laterality: N/A;  . CARDIOVERSION N/A 11/08/2016   Procedure: CARDIOVERSION;  Surgeon: Jolaine Artist, MD;  Location: [facility] ENDOSCOPY;  Service: Cardiovascular;  Laterality: N/A;  . CHOLECYSTECTOMY    . MAZE     with MV repair  . MITRAL VALVE REPAIR  01/2010   with MAZE  . myocardial window  2009   s/p afib ablation  . RIGHT HEART CATH N/A 11/22/2016   Procedure: Right Heart Cath;  Surgeon: Jolaine Artist, MD;  Location: [facility];  Service: Cardiovascular;  Laterality: N/A;  . SPLENECTOMY    . TEE WITHOUT CARDIOVERSION N/A 09/11/2013   Procedure: TRANSESOPHAGEAL ECHOCARDIOGRAM  (TEE);  Surgeon: Jolaine Artist, MD;  Location: [facility] ENDOSCOPY;  Service: Cardiovascular;  Laterality: N/A;     OB History   None      Home Medications    Prior to Admission medications   Medication Sig Start Date End Date Taking? Authorizing Provider  albuterol (PROVENTIL HFA;VENTOLIN HFA) 108 (90 Base) MCG/ACT inhaler Inhale 2 puffs into the lungs every 6 (six) hours as needed for wheezing or shortness of breath. 03/14/16  Yes Whiteheart, Cristal Ford, NP  augmented betamethasone dipropionate (DIPROLENE-AF) 0.05 % ointment Apply 1 application topically daily as needed for rash. 09/05/16  Yes [provider]  BREO ELLIPTA 100-25 MCG/INH AEPB INHALE 1 PUFF BY MOUTH EVERY DAY 06/04/17  Yes Byrum, Rose Fillers, MD  calcium-vitamin D (OSCAL WITH D) 500-200 MG-UNIT tablet Take 1 tablet by mouth 2 (two) times daily.   Yes [provider]  chlorpheniramine (CHLOR-TRIMETON) 4 MG tablet Take 4 mg by mouth at bedtime.   Yes [provider]  cholecalciferol (VITAMIN D) 1000 UNITS tablet Take 1,000 Units by mouth 2 (two) times daily.    Yes [provider]  clonazePAM (KLONOPIN) 1 MG tablet Take 2 mg by mouth at bedtime.    Yes [provider]  deferasirox (EXJADE) 500 MG disintegrating tablet Take 3 tablets (1,500 mg total) by mouth daily before breakfast. Faxed to [employer] [facility] [place] [phone] 02/27/17  Yes Ladell Pier, MD  dextromethorphan (DELSYM) 30 MG/5ML liquid Take 60 mg by mouth at bedtime as needed for cough.   Yes [provider]  diclofenac sodium (VOLTAREN) 1 % GEL Apply 2 g topically 4 (four) times daily as needed. Patient taking differently: Apply 2 g topically 4 (four) times daily as needed (for pain).  04/08/16  Yes Sheikh, Omair Latif, DO  diltiazem (CARDIZEM CD) 240 MG 24 hr capsule TAKE 1 CAPSULE (240 MG TOTAL) BY MOUTH DAILY. 03/04/17  Yes Bensimhon, Shaune Pascal, MD  ELIQUIS 5 MG TABS tablet TAKE 1 TABLET BY MOUTH  TWICE A DAY 04/02/17  Yes Larey Dresser, MD  EPINEPHrine (EPI-PEN) 0.3 mg/0.3 mL DEVI Inject 0.3 mg into the muscle once as needed (for allergic reaction).    Yes [provider]  escitalopram (LEXAPRO) 10 MG tablet Take 10 mg by mouth at bedtime.  03/03/14  Yes [provider]  fexofenadine (ALLEGRA) 180 MG tablet Take 180 mg by mouth daily after breakfast.    Yes [provider]  fluticasone (FLONASE) 50 MCG/ACT nasal spray Place 1 spray into both nostrils daily after breakfast.    Yes [provider]  folic acid (FOLVITE) 829 MCG tablet Take 400 mcg by mouth daily after breakfast.    Yes [provider]  Lactobacillus Rhamnosus, GG, (CULTURELLE) CAPS  Take 1 capsule by mouth daily after breakfast.    Yes [provider]  metolazone (ZAROXOLYN) 2.5 MG tablet Take 1 tablet (2.5 mg total) by mouth as needed. Patient taking differently: Take 2.5 mg by mouth daily as needed (arthritis).  10/26/16  Yes Bensimhon, Shaune Pascal, MD  metoprolol succinate (TOPROL-XL) 50 MG 24 hr tablet TAKE 1 TABLET (50 MG TOTAL) BY MOUTH 2 (TWO) TIMES DAILY. TAKE WITH OR IMMEDIATELY FOLLOWING A MEAL. 08/15/17 11/13/17 Yes Bensimhon, Shaune Pascal, MD  Multiple Vitamins-Minerals (AIRBORNE) TBEF Take 1 tablet by mouth 2 (two) times daily. *Dissolves in water twice a day*   Yes [provider]  OXYGEN Inhale 3 L into the lungs continuous.    Yes [provider]  pantoprazole (PROTONIX) 40 MG tablet Take 40 mg by mouth daily before breakfast.    Yes [provider]  potassium chloride SA (KLOR-CON M20) 20 MEQ tablet Take 3 tablets (60 mEq total) 3 (three) times daily by mouth. 04/09/17  Yes Bensimhon, Shaune Pascal, MD  raloxifene (EVISTA) 60 MG tablet Take 60 mg by mouth daily with breakfast.    Yes [provider]  senna-docusate (SENOKOT-S) 8.6-50 MG tablet Take 1 tablet by mouth at bedtime as needed for mild constipation. 04/08/16  Yes Sheikh, Omair  Latif, DO  sodium chloride (OCEAN) 0.65 % SOLN nasal spray Place 1 spray into both nostrils 2 (two) times daily.    Yes [provider]  spironolactone (ALDACTONE) 25 MG tablet Take 1 tablet (25 mg total) by mouth daily. 03/20/17  Yes Bensimhon, Shaune Pascal, MD  torsemide (DEMADEX) 20 MG tablet TAKE 2 TABLETS (40 MG TOTAL) BY MOUTH 2 (TWO) TIMES DAILY. 08/08/17  Yes Bensimhon, Shaune Pascal, MD  traMADol (ULTRAM) 50 MG tablet Take 1-2 tablets (50-100 mg total) by mouth 3 (three) times daily. Take 100 mg in the morning, 50mg s in the afternoon, and 100 mgs at bedtime Patient taking differently: Take 50-100 mg by mouth 2 (two) times daily. 50 mg every morning and afternoon, 100 mg at night 01/04/17  Yes Ladell Pier, MD  acetaminophen (TYLENOL 8 HOUR) 650 MG CR tablet Take 1 tablet (650 mg total) by mouth every 8 (eight) hours as needed. 09/14/17   Varney Biles, MD  amoxicillin-clavulanate (AUGMENTIN) 875-125 MG tablet Take 1 tablet by mouth 2 (two) times daily. Patient not taking: Reported on 09/14/2017 09/02/17   Magdalen Spatz, NP  HYDROcodone-acetaminophen (NORCO/VICODIN) 5-325 MG tablet Take 1 tablet by mouth every 12 (twelve) hours as needed for severe pain. 09/14/17   Varney Biles, MD  HYDROcodone-homatropine (HYDROMET) 5-1.5 MG/5ML syrup Take 5 mLs by mouth every 6 (six) hours as needed for cough. Patient not taking: Reported on 09/14/2017 09/02/17   Magdalen Spatz, NP  Lidocaine 4 % PTCH Apply 1 patch topically 2 (two) times daily. 09/14/17   Varney Biles, MD  predniSONE (DELTASONE) 10 MG tablet 3 tabs x 2days , 2tabs x2days , 1 tab x2 days then stop. Patient not taking: Reported on 09/14/2017 09/02/17   Magdalen Spatz, NP    Family History Family History  Problem Relation Age of Onset  . Breast cancer Mother 38       ER+  . Melanoma Father 37  . Adrenal disorder Sister        pheochromacytoma - rt. adrenal gland  . COPD Maternal Aunt   . Stomach cancer Paternal Uncle        onset in  53s  . Lung  cancer Maternal Grandmother        died late 8s, heavy smoker  . Heart disease Maternal Grandfather        hardening of the arteries, poor circulation  . Ovarian cancer Sister 15       stage IV, now 40    Social History Social History   Tobacco Use  . Smoking status: Never Smoker  . Smokeless tobacco: Never Used  Substance Use Topics  . Alcohol use: Yes    Comment: socially glass red wine monthly  . Drug use: No     Allergies   Iodine; Shellfish allergy; Iohexol; and Levaquin [levofloxacin in d5w]   Review of Systems Review of Systems  Constitutional: Positive for activity change.  Respiratory: Negative for shortness of breath.   Cardiovascular: Negative for chest pain.  Gastrointestinal: Negative for abdominal pain.  Musculoskeletal: Positive for arthralgias and myalgias.  Skin: Negative for rash.  Hematological: Bruises/bleeds easily.     Physical Exam Updated Vital Signs BP 117/63   Pulse 64   Temp 98.1 F (36.7 C) (Oral)   Resp 15   Ht 5\' 2"  (1.575 m)   Wt 71.2 kg (157 lb)   SpO2 98%   BMI 28.72 kg/m   Physical Exam  Constitutional: She is oriented to person, place, and time. She appears well-developed.  HENT:  Head: Normocephalic and atraumatic.  Eyes: EOM are normal.  Neck: Normal range of motion. Neck supple.  Cardiovascular: Normal rate.  Pulmonary/Chest: Effort normal.  Abdominal: Bowel sounds are normal.  Musculoskeletal:  Patient has tenderness to palpation over the left hip and the entire left lower back laterally.  No midline lumbar spine tenderness appreciated.    Neurological: She is alert and oriented to person, place, and time.  Skin: Skin is warm and dry.  No ecchymosis or hematoma.  Nursing note and vitals reviewed.    ED Treatments / Results  Labs (all labs ordered are listed, but only abnormal results are displayed) Labs Reviewed  URINALYSIS, ROUTINE W REFLEX MICROSCOPIC - Abnormal; Notable for the following  components:      Result Value   Leukocytes, UA TRACE (*)    Squamous Epithelial / LPF 0-5 (*)    All other components within normal limits  CBC WITH DIFFERENTIAL/PLATELET - Abnormal; Notable for the following components:   WBC 21.0 (*)    RBC 2.41 (*)    Hemoglobin 8.5 (*)    HCT 27.6 (*)    MCV 114.5 (*)    MCH 35.3 (*)    Platelets 624 (*)    Neutro Abs 17.2 (*)    Monocytes Absolute 1.1 (*)    All other components within normal limits  COMPREHENSIVE METABOLIC PANEL - Abnormal; Notable for the following components:   Potassium 6.0 (*)    Glucose, Bld 117 (*)    BUN 30 (*)    Creatinine, Ser 1.29 (*)    Total Bilirubin 1.7 (*)    GFR calc non Af Amer 42 (*)    GFR calc Af Amer 48 (*)    All other components within normal limits    EKG EKG Interpretation  Date/Time:  Saturday September 14 2017 02:05:53 EDT Ventricular Rate:  64 PR Interval:    QRS Duration: 104 QT Interval:  443 QTC Calculation: 458 R Axis:   93 Text Interpretation:  Atrial flutter Right axis deviation No acute changes Nonspecific ST and T wave abnormality Confirmed by Varney Biles ([id number]) on 09/14/2017 2:14:23 AM  Radiology Dg Pelvis 1-2 Views  Result Date: 09/14/2017 CLINICAL DATA:  Left hip pain after fall EXAM: PELVIS - 1-2 VIEW COMPARISON:  CT 04/05/2016 FINDINGS: There is no evidence of pelvic fracture or diastasis. No hip fracture or dislocation. No pelvic bone lesions are seen. Surgical clips project over the right ischium. There is lumbar degenerative disc disease mild levoconvex curvature, apex at L2. IMPRESSION: Negative for acute pelvic or hip fracture. Levoconvex curvature of the lumbar spine with degenerative disc disease. Electronically Signed   By: Ashley Royalty M.D.   On: 09/14/2017 00:02   Ct Head Wo Contrast  Result Date: 09/14/2017 CLINICAL DATA:  Initial evaluation for acute trauma, fall. EXAM: CT HEAD WITHOUT CONTRAST TECHNIQUE: Contiguous axial images were obtained from the base of  the skull through the vertex without intravenous contrast. COMPARISON:  Prior CT from 09/30/2009. FINDINGS: Brain: Age-related cerebral atrophy with mild chronic small vessel ischemic disease. No acute intracranial hemorrhage. No acute large vessel territory infarct. No mass lesion, midline shift or mass effect. No hydrocephalus. No extra-axial fluid collection. Vascular: No hyperdense vessel. Skull: Scalp soft tissues and calvarium within normal limits. Sinuses/Orbits: Globes and orbital soft tissues within normal limits. Mild mucosal thickening with small retention cyst noted within the right maxillary sinus. Paranasal sinuses are otherwise clear. No mastoid effusion. Other: None. IMPRESSION: 1. No acute intracranial abnormality. 2. Mild atrophy with chronic small vessel ischemic disease. Electronically Signed   By: Jeannine Boga M.D.   On: 09/14/2017 00:51    Procedures Procedures (including critical care time)  Medications Ordered in ED Medications  furosemide (LASIX) injection 40 mg (has no administration in time range)  fentaNYL (SUBLIMAZE) injection 75 mcg (75 mcg Intravenous Given 09/14/17 0007)  calcium gluconate 1 g in sodium chloride 0.9 % 100 mL IVPB (0 g Intravenous Stopped 09/14/17 0237)  HYDROcodone-acetaminophen (NORCO/VICODIN) 5-325 MG per tablet 1 tablet (1 tablet Oral Given 09/14/17 0200)  sodium polystyrene (KAYEXALATE) 15 GM/60ML suspension 30 g (30 g Oral Given 09/14/17 0200)     Initial Impression / Assessment and Plan / ED Course  I have reviewed the triage vital signs and the nursing notes.  Pertinent labs & imaging results that were available during my care of the patient were reviewed by me and considered in my medical decision making (see chart for details).  Clinical Course as of Sep 14 245  Sat Sep 14, 2017  0121 Patient had elevated white count 2 weeks ago as well.  At that time she was diagnosed with pneumonia and started on Augmentin.  At this time patient  denies any new infection-like symptoms.  I am not sure what to make of her leukocytosis and the left shift.  I will send an email to patient's hematologist to ensure they can close the loop on this matter.  WBC(!): 21.0 [AN]  0122 Patient has received IV pain medication.  On reassessment she feels a lot better.  Oral pain meds provided.  Patient's K is 6.  Could be a lab error.  We have ordered calcium gluconate and Kayexalate..   [AN]  0246 Patient's K is high.  She is on oral potassium.  We will give patient IV Lasix, calcium gluconate and Kayexalate.  Patient has been advised to stop taking potassium and to contact cardiology.  EKG does not show any effects of hyperkalemia.  I do not think patient needs to stay in the ER for her potassium, as the underlying etiology is supplemental K and not kidney  problem.  Potassium(!): 6.0 [AN]  0246 Repeat exam is unchanged.  Hemoglobin is 8.5, which is around her normal level. Stable for discharge.    [AN]  0247 Oncology - Dr. Benay Spice notified via secure message about the left shift.   [AN]    Clinical Course User Index [AN] Varney Biles, MD    68 year old female with history of A. fib, pulmonary hypertension, blood dyscrasia comes in after a fall.  Patient is on Eliquis and she also has known history of severe anemia.  On exam patient is having left-sided tenderness and hip tenderness.  We will get x-ray of her hip and CT scan of the head.  C-spine has been cleared clinically.  Patient is status post splenectomy which is reassuring.  I doubt that patient had severe enough blunt trauma to cause kidney fracture or perforated viscus.  Given that she is on Eliquis there is a possibility of intraperitoneal bleed.  Pretest probability for that is low.  We will get basic labs to compare the hemoglobin with her last hemoglobin that was drawn on 4 8.. We will also start to address her pain.  Final Clinical Impressions(s) / ED Diagnoses   Final diagnoses:    Fall, initial encounter  Contusion of lower back, initial encounter  Acute hyperkalemia    ED Discharge Orders        Ordered    HYDROcodone-acetaminophen (NORCO/VICODIN) 5-325 MG tablet  Every 12 hours PRN     09/14/17 0237    acetaminophen (TYLENOL 8 HOUR) 650 MG CR tablet  Every 8 hours PRN     09/14/17 0237    Lidocaine 4 % PTCH  2 times daily     09/14/17 0237       Varney Biles, MD 09/14/17 [phone]

## 2017-09-23 ENCOUNTER — Other Ambulatory Visit: Payer: Self-pay | Admitting: Cardiology

## 2017-09-24 ENCOUNTER — Encounter: Payer: Self-pay | Admitting: Emergency Medicine

## 2017-09-24 ENCOUNTER — Ambulatory Visit (INDEPENDENT_AMBULATORY_CARE_PROVIDER_SITE_OTHER): Payer: Medicare Other | Admitting: Emergency Medicine

## 2017-09-24 DIAGNOSIS — J301 Allergic rhinitis due to pollen: Secondary | ICD-10-CM

## 2017-09-24 DIAGNOSIS — J9611 Chronic respiratory failure with hypoxia: Secondary | ICD-10-CM | POA: Diagnosis not present

## 2017-09-24 DIAGNOSIS — G4733 Obstructive sleep apnea (adult) (pediatric): Secondary | ICD-10-CM

## 2017-09-24 DIAGNOSIS — J45909 Unspecified asthma, uncomplicated: Secondary | ICD-10-CM

## 2017-09-24 DIAGNOSIS — J181 Lobar pneumonia, unspecified organism: Secondary | ICD-10-CM

## 2017-09-24 DIAGNOSIS — J189 Pneumonia, unspecified organism: Secondary | ICD-10-CM

## 2017-09-24 NOTE — Assessment & Plan Note (Signed)
Please continue your Breo, albuterol as your are taking them

## 2017-09-24 NOTE — Patient Instructions (Signed)
You need CXR in 1 month to look for resolution of pneumonia.  Please continue your Breo, albuterol as your are taking them  Please continue your flonase and allegra as you are taking them  Oxygen at 2-3 L/min Continue your CPAP every night  Follow with Dr Lamonte Sakai in 1 month or next available to review the CXR.

## 2017-09-24 NOTE — Assessment & Plan Note (Signed)
Oxygen at 2-3 L/min

## 2017-09-24 NOTE — Assessment & Plan Note (Signed)
Please continue your flonase and allegra as you are taking them

## 2017-09-24 NOTE — Addendum Note (Signed)
Addended by: Desmond Dike C on: 09/24/2017 11:54 AM   Modules accepted: Orders

## 2017-09-24 NOTE — Assessment & Plan Note (Signed)
Clinically improved.  Plan for repeat chest x-ray in about 1 month, review to ensure resolution of her right middle lobe infiltrate.  If it has not resolved and we will decide whether she needs further imaging.

## 2017-09-24 NOTE — Assessment & Plan Note (Signed)
  Continue your CPAP every night. 

## 2017-09-24 NOTE — Progress Notes (Signed)
   Subjective:    Patient ID: [name], female    DOB: 15-Nov-1950, 67 y.o.   MRN: [id number]  Asthma  She complains of cough, shortness of breath and wheezing. Associated symptoms include postnasal drip. Pertinent negatives include no ear pain, fever, headaches, rhinorrhea, sneezing, sore throat or trouble swallowing. Her past medical history is significant for asthma.  Cough  Associated symptoms include postnasal drip, shortness of breath and wheezing. Pertinent negatives include no ear pain, eye redness, fever, headaches, rash, rhinorrhea or sore throat. Her past medical history is significant for asthma.    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  She has hypoxemic respiratory failure and uses home oxygen set at. She was seen here about 3 weeks ago with an acute exacerbation of her asthma, suspected due to allergies. On allegra and flonase.  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 She was treated with pred and Augmentin.  She reports today feeling better, both with her cough and her congestion. She is maintained on Breo. She is using albuterol about once a month at baseline. She has good CPAP compliance, feels that she clinically benefits.    Review of Systems  Constitutional: Negative for fever and unexpected weight change.  HENT: Positive for congestion and postnasal drip. Negative for dental problem, ear pain, nosebleeds, rhinorrhea, sinus pressure, sneezing, sore throat and trouble swallowing.   Eyes: Negative for redness and itching.  Respiratory: Positive for cough, shortness of breath and wheezing. Negative for chest tightness.   Cardiovascular: Negative for palpitations and leg swelling.  Gastrointestinal: Negative for nausea and vomiting.  Genitourinary:  Negative for dysuria.  Musculoskeletal: Negative for joint swelling.  Skin: Negative for rash.  Neurological: Negative for headaches.  Hematological: Does not bruise/bleed easily.  Psychiatric/Behavioral: Negative for dysphoric mood. The patient is not nervous/anxious.        Objective:   Physical Exam  Vitals:   09/24/17 1123 09/24/17 1124  BP:  120/80  Pulse:  94  SpO2:  94%  Weight: 158 lb (71.7 kg)   Height: 5\' 2"  (1.575 m)   Gen: Pleasant, well-nourished, in no distress,  normal affect  ENT: No lesions,  mouth clear,  oropharynx clear, some postnasal drip, hoarse voice   Neck: No JVD, no stridor  Lungs: No use of accessory muscles, clear B   Cardiovascular: RRR, 2/6 M  Musculoskeletal: No deformities, no cyanosis or clubbing  Neuro: alert, non focal  Skin: Warm, no lesions or rashes      Assessment & Plan:  CAP (community acquired pneumonia) Clinically improved.  Plan for repeat chest x-ray in about 1 month, review to ensure resolution of her right middle lobe infiltrate.  If it has not resolved and we will decide whether she needs further imaging.  Asthma, intrinsic Please continue your Breo, albuterol as your are taking them   Allergic rhinitis Please continue your flonase and allegra as you are taking them   Obstructive sleep apnea  Continue your CPAP every night   Chronic respiratory failure ([place]) Oxygen at 2-3 L/min  Baltazar Apo, MD, PhD 09/24/2017, 11:47 AM [facility] [phone] or if no answer [phone]

## 2017-09-28 ENCOUNTER — Other Ambulatory Visit (HOSPITAL_COMMUNITY): Payer: Self-pay | Admitting: Internal Medicine

## 2017-10-07 ENCOUNTER — Other Ambulatory Visit (HOSPITAL_COMMUNITY): Payer: Self-pay | Admitting: *Deleted

## 2017-10-07 MED ORDER — DILTIAZEM HCL ER COATED BEADS 240 MG PO CP24: 240.0000 mg | ORAL_CAPSULE | Freq: Every day | ORAL | 3 refills | Status: DC

## 2017-10-14 ENCOUNTER — Inpatient Hospital Stay: Payer: Medicare Other

## 2017-10-14 ENCOUNTER — Inpatient Hospital Stay: Payer: Medicare Other | Attending: Oncology | Admitting: Oncology

## 2017-10-17 DIAGNOSIS — Z01419 Encounter for gynecological examination (general) (routine) without abnormal findings: Secondary | ICD-10-CM | POA: Diagnosis not present

## 2017-10-17 DIAGNOSIS — Z1231 Encounter for screening mammogram for malignant neoplasm of breast: Secondary | ICD-10-CM | POA: Diagnosis not present

## 2017-10-17 DIAGNOSIS — Z6830 Body mass index (BMI) 30.0-30.9, adult: Secondary | ICD-10-CM | POA: Diagnosis not present

## 2017-10-30 ENCOUNTER — Other Ambulatory Visit (HOSPITAL_COMMUNITY): Payer: Self-pay | Admitting: Internal Medicine

## 2017-11-05 ENCOUNTER — Ambulatory Visit (INDEPENDENT_AMBULATORY_CARE_PROVIDER_SITE_OTHER): Payer: Medicare Other | Admitting: Emergency Medicine

## 2017-11-05 ENCOUNTER — Telehealth: Payer: Self-pay | Admitting: Oncology

## 2017-11-05 ENCOUNTER — Inpatient Hospital Stay: Payer: Medicare Other | Attending: Oncology

## 2017-11-05 ENCOUNTER — Inpatient Hospital Stay (HOSPITAL_BASED_OUTPATIENT_CLINIC_OR_DEPARTMENT_OTHER): Payer: Medicare Other | Admitting: Oncology

## 2017-11-05 ENCOUNTER — Encounter: Payer: Self-pay | Admitting: Emergency Medicine

## 2017-11-05 VITALS — BP 124/78 | HR 90 | Temp 98.4°F | Resp 18 | Ht 62.0 in | Wt 159.7 lb

## 2017-11-05 DIAGNOSIS — K219 Gastro-esophageal reflux disease without esophagitis: Secondary | ICD-10-CM

## 2017-11-05 DIAGNOSIS — D594 Other nonautoimmune hemolytic anemias: Secondary | ICD-10-CM

## 2017-11-05 DIAGNOSIS — J45909 Unspecified asthma, uncomplicated: Secondary | ICD-10-CM | POA: Diagnosis not present

## 2017-11-05 DIAGNOSIS — J301 Allergic rhinitis due to pollen: Secondary | ICD-10-CM

## 2017-11-05 DIAGNOSIS — I4891 Unspecified atrial fibrillation: Secondary | ICD-10-CM | POA: Insufficient documentation

## 2017-11-05 DIAGNOSIS — I340 Nonrheumatic mitral (valve) insufficiency: Secondary | ICD-10-CM | POA: Insufficient documentation

## 2017-11-05 DIAGNOSIS — D552 Anemia due to disorders of glycolytic enzymes: Secondary | ICD-10-CM | POA: Diagnosis not present

## 2017-11-05 DIAGNOSIS — J986 Disorders of diaphragm: Secondary | ICD-10-CM | POA: Diagnosis not present

## 2017-11-05 DIAGNOSIS — J9611 Chronic respiratory failure with hypoxia: Secondary | ICD-10-CM

## 2017-11-05 DIAGNOSIS — G4733 Obstructive sleep apnea (adult) (pediatric): Secondary | ICD-10-CM | POA: Diagnosis not present

## 2017-11-05 DIAGNOSIS — I4892 Unspecified atrial flutter: Secondary | ICD-10-CM | POA: Insufficient documentation

## 2017-11-05 LAB — FERRITIN: Ferritin: 51 ng/mL (ref 9–269)

## 2017-11-05 LAB — CBC WITH DIFFERENTIAL/PLATELET
BASOS PCT: 0 %
Basophils Absolute: 0 10*3/uL (ref 0.0–0.1)
Eosinophils Absolute: 0.4 10*3/uL (ref 0.0–0.5)
Eosinophils Relative: 4 %
HEMATOCRIT: 27 % — AB (ref 34.8–46.6)
Hemoglobin: 8.9 g/dL — ABNORMAL LOW (ref 11.6–15.9)
LYMPHS ABS: 2.1 10*3/uL (ref 0.9–3.3)
LYMPHS PCT: 23 %
MCH: 37.4 pg — AB (ref 25.1–34.0)
MCHC: 32.9 g/dL (ref 31.5–36.0)
MCV: 113.8 fL — AB (ref 79.5–101.0)
MONO ABS: 1.9 10*3/uL — AB (ref 0.1–0.9)
MONOS PCT: 21 %
NEUTROS ABS: 4.7 10*3/uL (ref 1.5–6.5)
Neutrophils Relative %: 52 %
Platelets: 467 10*3/uL — ABNORMAL HIGH (ref 145–400)
RBC: 2.37 MIL/uL — ABNORMAL LOW (ref 3.70–5.45)
RDW: 14.6 % — ABNORMAL HIGH (ref 11.2–14.5)
WBC: 9.1 10*3/uL (ref 3.9–10.3)

## 2017-11-05 MED ORDER — PANTOPRAZOLE SODIUM 40 MG PO TBEC: 40.0000 mg | DELAYED_RELEASE_TABLET | Freq: Every day | ORAL | 5 refills | Status: DC

## 2017-11-05 NOTE — Assessment & Plan Note (Signed)
Please continue Breo 1 inhalation daily.  Remember to rinse and gargle after using this medication Please keep albuterol available to use 2 puffs if needed for shortness of breath, wheezing, chest tightness.

## 2017-11-05 NOTE — Assessment & Plan Note (Signed)
Please continue Allegra, Flonase, chlorpheniramine as you have been using them.

## 2017-11-05 NOTE — Progress Notes (Signed)
  [place] OFFICE PROGRESS NOTE   Diagnosis: Pyruvate kinase deficiency  INTERVAL HISTORY:   [name] returns as scheduled.  She reports stable dyspnea.  She is maintained on oxygen.  She is scheduled to see Dr. Lamonte [name] later today.  She is taking Exjade every other day.  Stable bone pain, relieved with tramadol.  Objective:  Vital signs in last 24 hours:  Blood pressure 124/78, pulse 90, temperature 98.4 F (36.9 C), temperature source Oral, resp. rate 18, height 5\' 2"  (1.575 m), weight 159 lb 11.2 oz (72.4 kg), SpO2 93 %.    HEENT: Mild scleral icterus Resp: End inspiratory rhonchi at the right base, no respiratory distress Cardio: Regular rate and rhythm GI: No hepatomegaly, nontender Vascular: No leg edema   Lab Results:  Lab Results  Component Value Date   WBC 9.1 11/05/2017   HGB 8.9 (L) 11/05/2017   HCT 27.0 (L) 11/05/2017   MCV 113.8 (H) 11/05/2017   PLT 467 (H) 11/05/2017   NEUTROABS 4.7 11/05/2017  Ferritin-51  Medications: I have reviewed the patient's current medications.   Assessment/Plan: 1. Hereditary pyruvate kinase deficiency, chronic anemia 2. 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 History of atrial fibrillation/flutter, status post a Maze procedure 4. History of mitral regurgitation, status post mitral valve repair by Dr. [name] in February 2010 5. 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 History of right diaphragm paralysis 7. Admission 04/02/2016 with left lung pneumonia  Disposition: [name] appears unchanged.  The ferritin remains in goal range.  She will continue Exjade at the current dose.  She will return for a ferritin level in 3 months and an office visit in 6 months.  15 minutes were spent with the patient today.  The majority of the time was used for counseling and coordination of care.  [name], MD  11/05/2017  11:22 AM

## 2017-11-05 NOTE — Assessment & Plan Note (Signed)
Continue CPAP.  

## 2017-11-05 NOTE — Telephone Encounter (Signed)
Appointments schedule AVS/Calendar printed per 6/11 los

## 2017-11-05 NOTE — Patient Instructions (Signed)
Please continue Breo 1 inhalation daily.  Remember to rinse and gargle after using this medication Please keep albuterol available to use 2 puffs if needed for shortness of breath, wheezing, chest tightness. Please continue Allegra, Flonase, chlorpheniramine as you have been using them. Please continue Protonix once daily. Please continue your oxygen at 3 L/min at all times. Get your flu shot this fall. Follow with Dr Lamonte Sakai in 6 months or sooner if you have any problems

## 2017-11-05 NOTE — Progress Notes (Signed)
Subjective:    Patient ID: [name], female    DOB: Mar 20, 1951, 67 y.o.   MRN: [id number]  Asthma  She complains of cough, shortness of breath and wheezing. Associated symptoms include postnasal drip. Pertinent negatives include no ear pain, fever, headaches, rhinorrhea, sneezing, sore throat or trouble swallowing. Her past medical history is significant for asthma.  Cough  Associated symptoms include postnasal drip, shortness of breath and wheezing. Pertinent negatives include no ear pain, eye redness, fever, headaches, rash, rhinorrhea or sore throat. Her past medical history is significant for asthma.    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  She has hypoxemic respiratory failure and uses home oxygen set at. She was seen here about 3 weeks ago with an acute exacerbation of her asthma, suspected due to allergies. On allegra and flonase.  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 She was treated with pred and Augmentin.  She reports today feeling better, both with her cough and her congestion. She is maintained on Breo. She is using albuterol about once a month at baseline. She has good CPAP compliance, feels that she clinically benefits.   ROV 11/05/17 --follow-up visit for history of asthma, obstructive sleep apnea for which she uses CPAP.  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  She has atrial fibrillation, hypertension, mitral valve disease resulting in diastolic dysfunction and secondary pulmonary hypertension.  She wears oxygen set at 3L/min.  She is currently managed on Breo.  She is been using albuterol rarely. She is feeling closer to baseline now. She has been having some increased anxiety, has been back in A fib. She is not  having any cough or wheeze. She is on allegra, flonase, chlorpheniramine. She is on protonix.    Review of Systems  Constitutional: Negative for fever and unexpected weight change.  HENT: Positive for congestion and postnasal drip. Negative for dental problem, ear pain, nosebleeds, rhinorrhea, sinus pressure, sneezing, sore throat and trouble swallowing.   Eyes: Negative for redness and itching.  Respiratory: Positive for cough, shortness of breath and wheezing. Negative for chest tightness.   Cardiovascular: Negative for palpitations and leg swelling.  Gastrointestinal: Negative for nausea and vomiting.  Genitourinary: Negative for dysuria.  Musculoskeletal: Negative for joint swelling.  Skin: Negative for rash.  Neurological: Negative for headaches.  Hematological: Does not bruise/bleed easily.  Psychiatric/Behavioral: Negative for dysphoric mood. The patient is not nervous/anxious.        Objective:   Physical Exam  Vitals:   11/05/17 1601 11/05/17 1603  BP:  (!) 128/92  Pulse:  97  SpO2:  93%  Weight: 159 lb (72.1 kg)   Height: 5' 2.25" (1.581 m)   Gen: Pleasant, well-nourished, in no distress,  normal affect  ENT: No lesions,  mouth clear,  oropharynx clear, some postnasal drip, hoarse voice   Neck: No JVD, no stridor  Lungs: No use of accessory muscles, clear B   Cardiovascular: RRR, 2/6 M  Musculoskeletal: No deformities, no cyanosis or clubbing  Neuro: alert, non focal  Skin: Warm, no lesions or rashes      Assessment & Plan:  Asthma, intrinsic Please continue Breo 1 inhalation daily.  Remember to rinse and gargle after using this medication Please keep albuterol available to use 2 puffs if needed for shortness of breath, wheezing, chest  tightness.   Allergic rhinitis Please continue Allegra, Flonase, chlorpheniramine as you have been using them.  Obstructive sleep apnea Continue CPAP   Chronic respiratory failure (HCC) O2 at 3l/min at all times.    GERD Continue protonix, will refill today  Baltazar Apo, MD, PhD 11/05/2017, 4:30 PM [facility] [phone] or if no answer [phone]

## 2017-11-05 NOTE — Assessment & Plan Note (Signed)
Continue protonix, will refill today

## 2017-11-05 NOTE — Assessment & Plan Note (Signed)
O2 at 3l/min at all times.

## 2017-11-06 ENCOUNTER — Telehealth: Payer: Self-pay | Admitting: Oncology

## 2017-11-06 NOTE — Telephone Encounter (Signed)
Appointment for lab added to patient schedule for December / letter/calendar mailed to patient per 6/11 staff message

## 2017-11-11 IMAGING — MR MR THORACIC SPINE W/O CM
4 of 6 series · 20 of 48 positions shown · non-contrast
Comparison: CT 04/05/2016

CLINICAL DATA: One week history of left-sided back pain which is
progressive. Difficulty ambulating.

EXAM:
MRI THORACIC AND LUMBAR SPINE WITHOUT CONTRAST
TECHNIQUE: Multiplanar and multiecho pulse sequences of the thoracic and lumbar
spine were obtained without intravenous contrast.

[Series 2: sag (id) · sagittal · 3.0mm · 1.88mm/px · 3 of 12 slices shown]
[im 1/12]
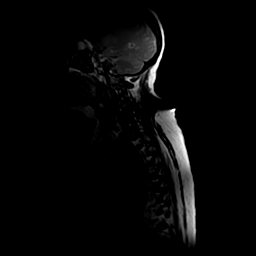
[im 6/12]
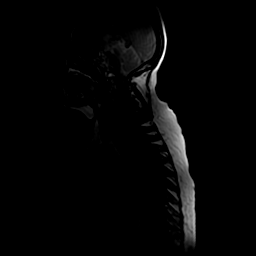
[im 12/12]
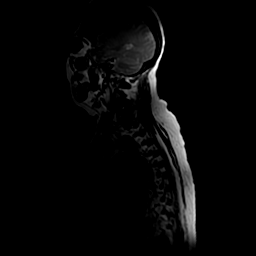

[Series 5: T1 · sagittal · 3.0mm · 0.62mm/px · 4 of 15 slices shown]
[im 1/15]
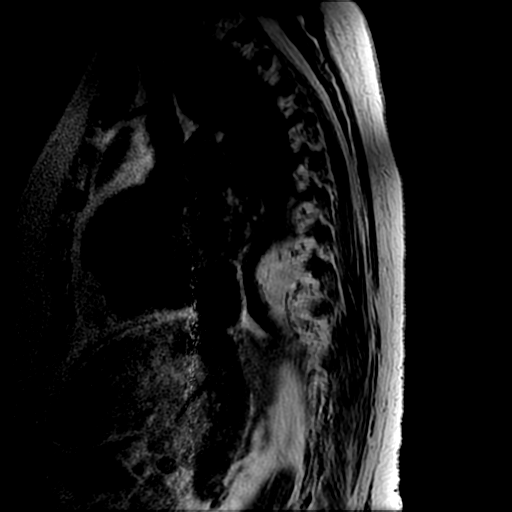
[im 4/15]
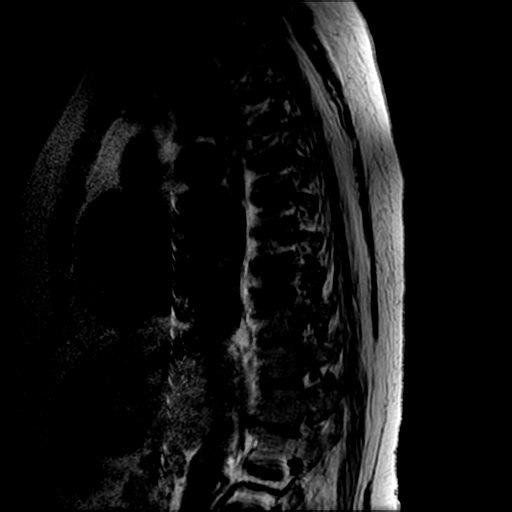
[im 8/15]
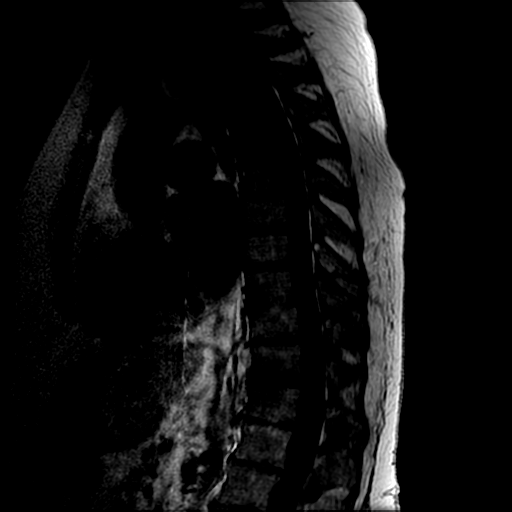
[im 15/15]
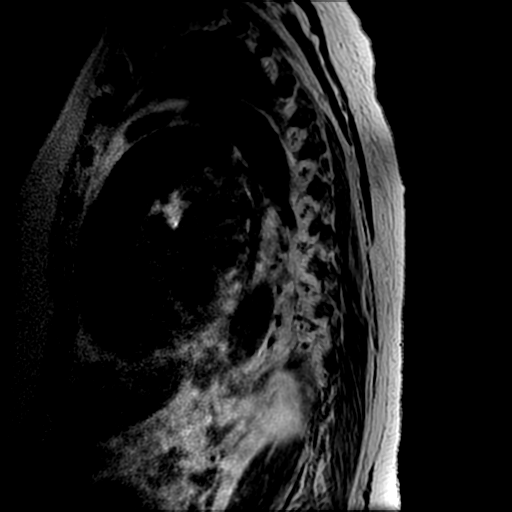

[Series 7: T2 post-contrast · sagittal · 3.0mm · 0.62mm/px · 5 of 15 slices shown]
[im 1/15]
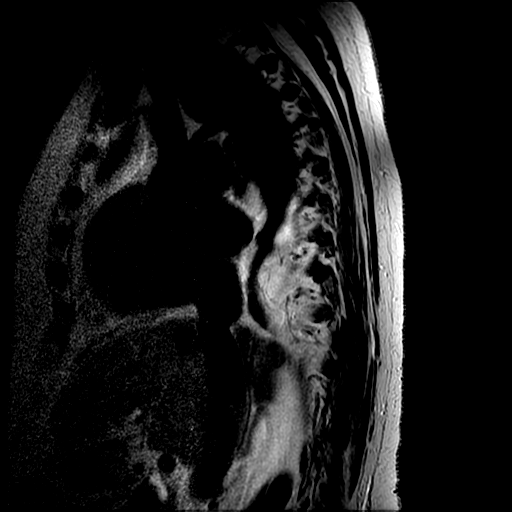
[im 4/15]
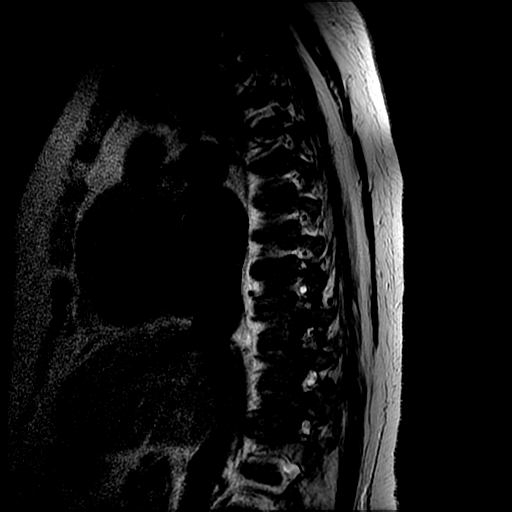
[im 8/15]
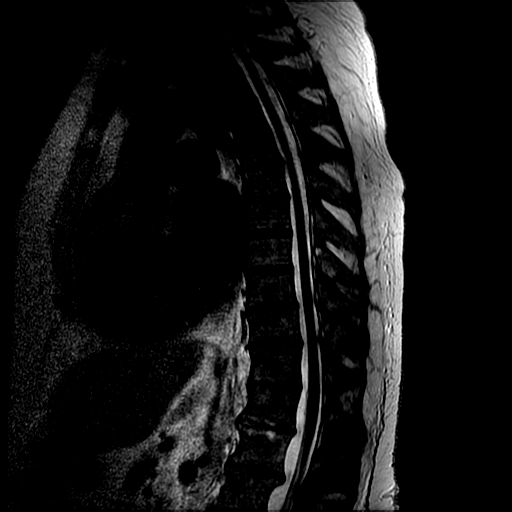
[im 11/15]
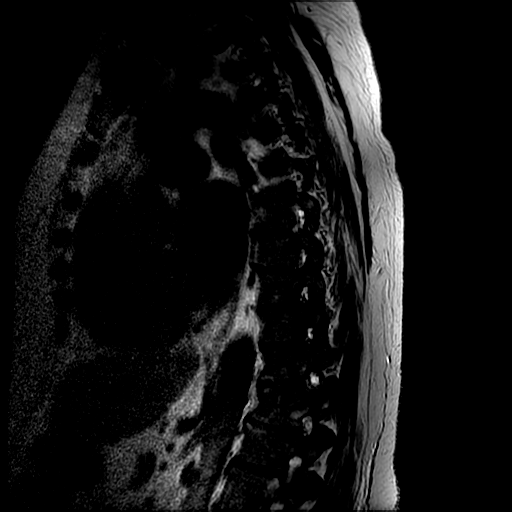
[im 15/15]
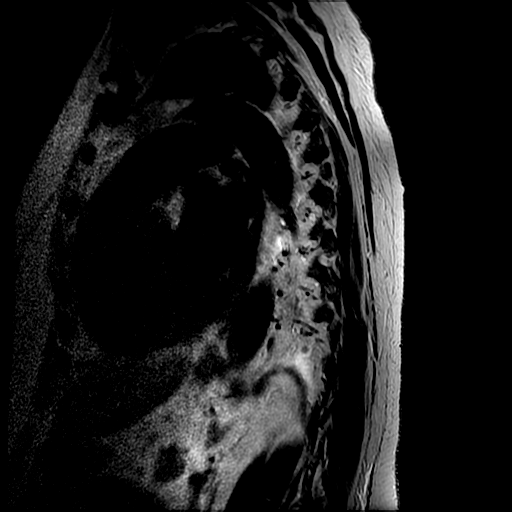

[Series 8: T2 · axial · 4.0mm · 0.39mm/px · z∈[-249,-42]mm · 8 of 39 slices shown]
[im 1/39]
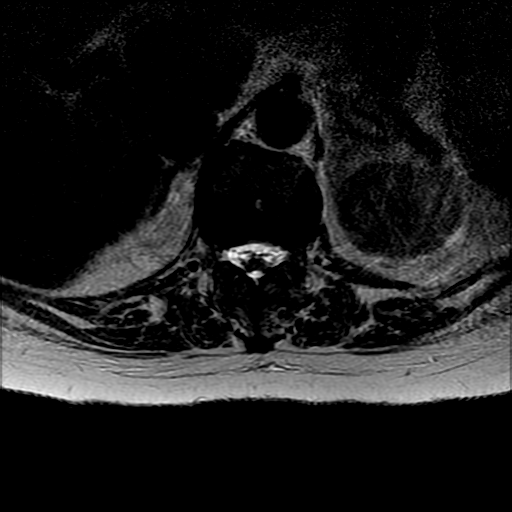
[im 6/39]
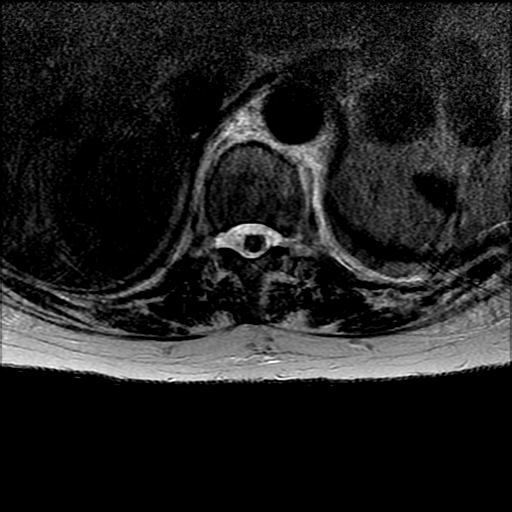
[im 12/39]
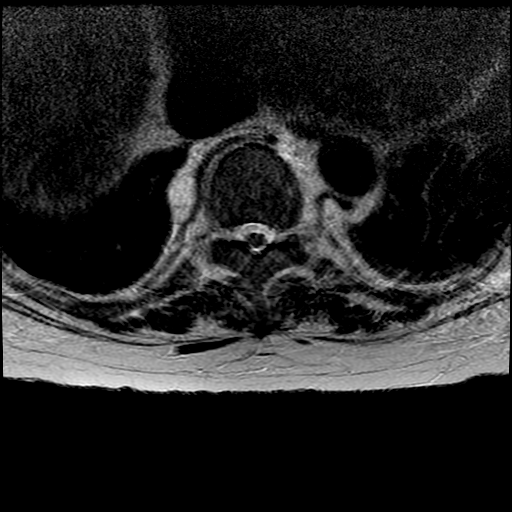
[im 18/39]
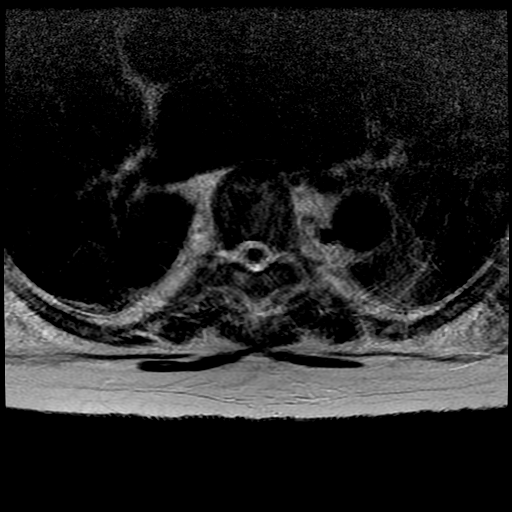
[im 21/39]
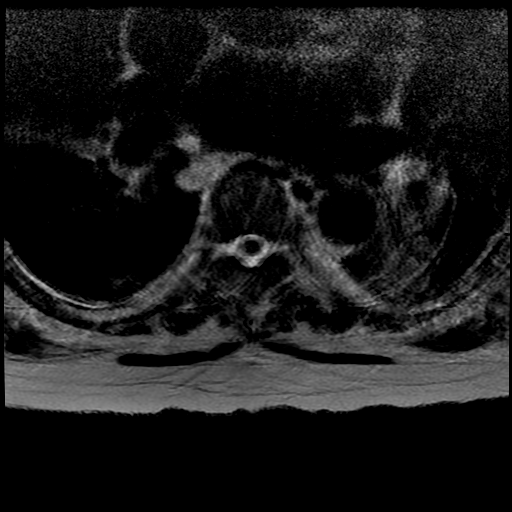
[im 27/39]
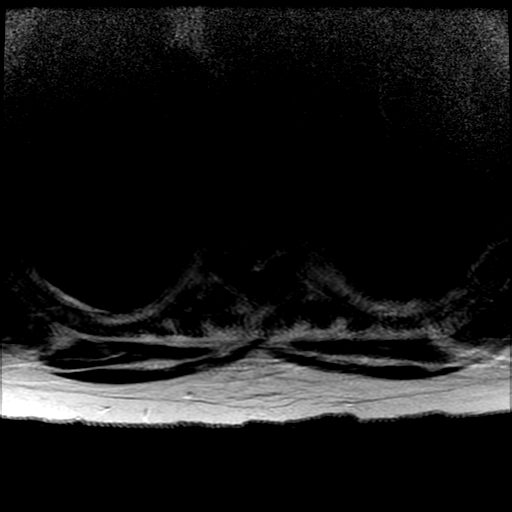
[im 33/39]
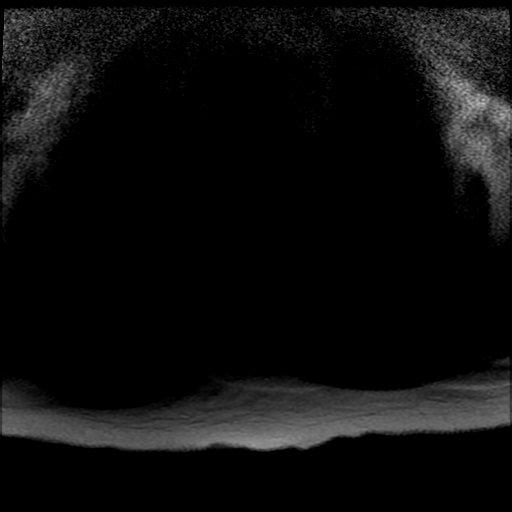
[im 39/39]
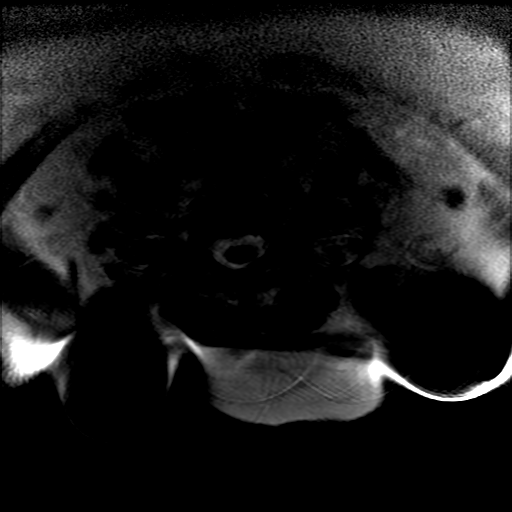

[20 of 48 positions shown; findings below may reference images not displayed]

FINDINGS: MRI THORACIC SPINE FINDINGS

Alignment:  Mild straightening of the thoracic alignment.

Vertebrae: No fracture. Marrow edema anteriorly at T11-12 which is
nonspecific. See below.

Cord:  No cord compression or primary cord lesion.

Paraspinal and other soft tissues: Small left effusion with
dependent left lung atelectasis or pneumonia.

Disc levels:

No significant finding from L1-2 through T10-11. Mild desiccation of
the discs with minimal bulges but no herniation or stenosis. No
edematous facet arthropathy.

At T11-12, as noted above, there is edema of within the anterior
inferior portion of the T11 vertebral body in the anterior superior
portion of the T12 vertebral body. There is degenerative disc
disease with small endplate irregularities. Usually, this is
discogenic. This can not be differentiated from early discitis
osteomyelitis.

T12-L1 shows mild degenerative change as discussed in the lumbar
section. There is mild T2 signal in the disc, but this is likely
degenerative.

MRI LUMBAR SPINE FINDINGS

Segmentation:  S1 is transitional.

Alignment:  Normal

Vertebrae:  No fracture or primary bone lesion.

Conus medullaris: Extends to the L2 level and appears normal.

Paraspinal and other soft tissues: No significant finding.

Disc levels:

L1-2: Disc degeneration with loss of height. Mild T2 signal in the
disc which can be degenerative. Minimal degenerative endplate
changes. Mild bulging of the disc. No stenosis or neural
compression.

L2-3: Endplate osteophytes and bulging of the disc. Mild narrowing
of the lateral recesses right more than left but no compressive
stenosis.

L3-4: Endplate osteophytes mild bulging of the disc. Mild narrowing
of the lateral recesses but no neural compression.

L4-5: Mild bulging of the disc. Mild facet hypertrophy. Mild
narrowing of the lateral recesses but no neural compression.

L5-S1: Endplate osteophytes and bulging of the disc. Mild facet
hypertrophy. No compressive stenosis.

S1-2:  Transitional level.  No disc pathology.  No stenosis
IMPRESSION: MR THORACIC SPINE IMPRESSION

Ordinary mild degenerative changes. Edema within the anterior
aspects of the T11 and T12 vertebral bodies that is favored to be
discogenic secondary to degenerative change. However, early discitis
osteomyelitis can have this appearance. If there is clinical
concern, follow-up would be suggested.

MR LUMBAR SPINE IMPRESSION

In the lumbar region, there is ordinary mild degenerative disc
disease and degenerative facet disease but no advanced pathology.
Mild lateral recess stenosis at L4-5.

## 2017-11-11 IMAGING — MR MR LUMBAR SPINE W/O CM
4 of 5 series · 18 of 48 positions shown · non-contrast
Comparison: CT 04/05/2016

CLINICAL DATA: One week history of left-sided back pain which is
progressive. Difficulty ambulating.

EXAM:
MRI THORACIC AND LUMBAR SPINE WITHOUT CONTRAST
TECHNIQUE: Multiplanar and multiecho pulse sequences of the thoracic and lumbar
spine were obtained without intravenous contrast.

[Series 3: T1 · sagittal · 4.0mm · 0.51mm/px · 3 of 15 slices shown (1 of 2)]
[im 3/15]
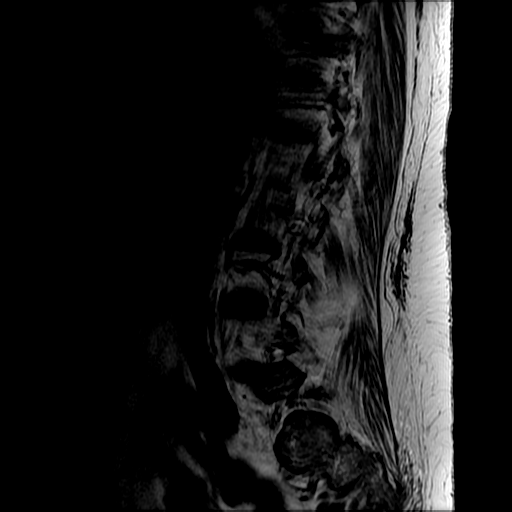
[im 8/15]
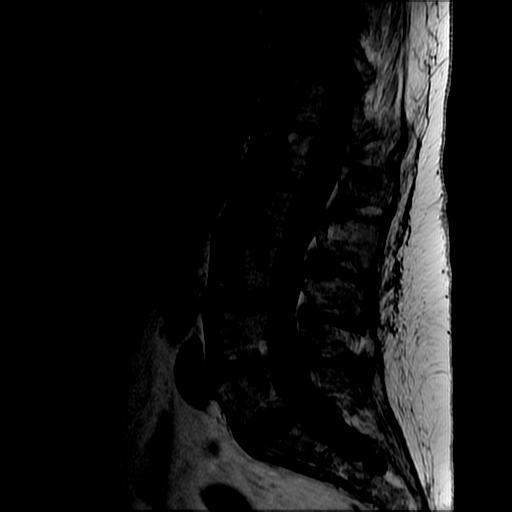
[im 12/15]
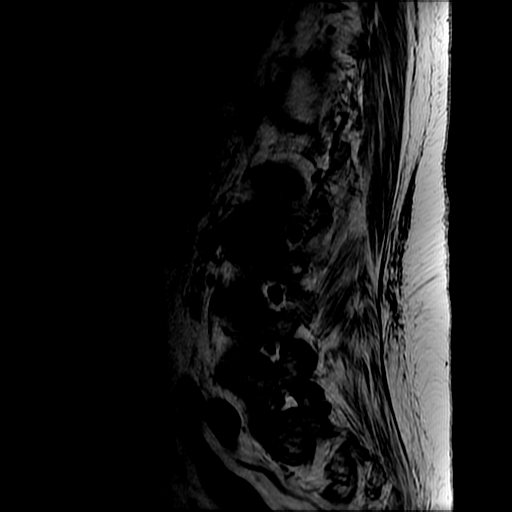

[Series 4: T2 post-contrast · sagittal · 4.0mm · 0.51mm/px · 7 of 15 slices shown]
[im 1/15]
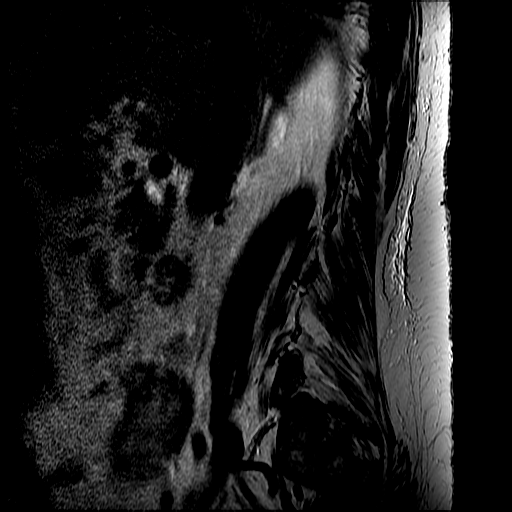
[im 3/15]
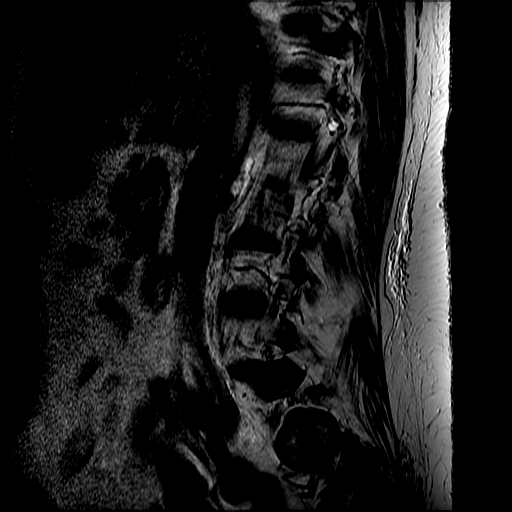
[im 5/15]
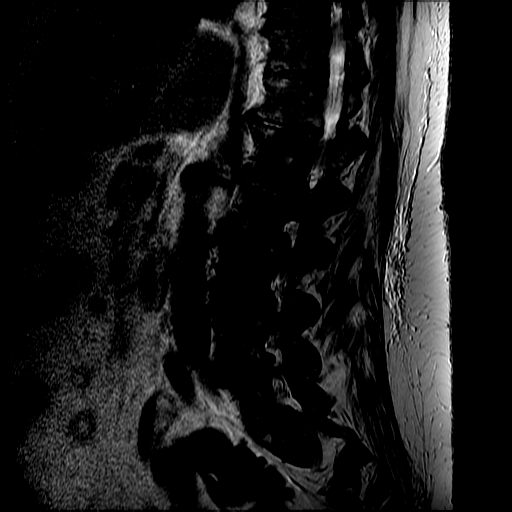
[im 8/15]
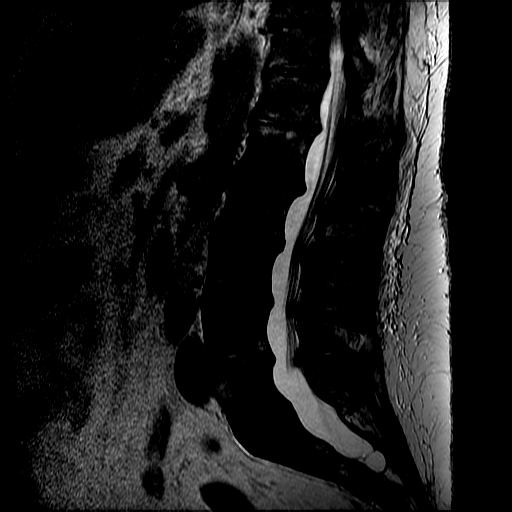
[im 10/15]
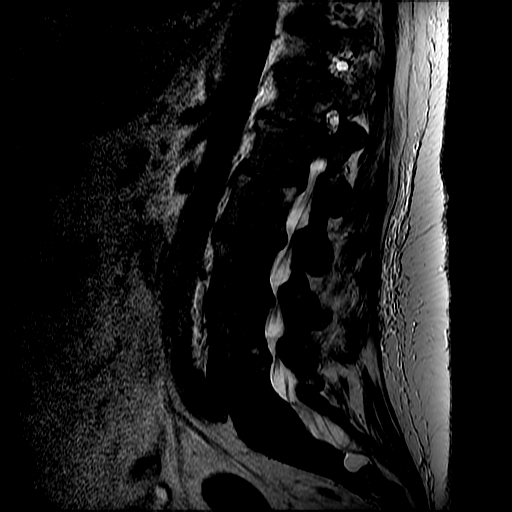
[im 12/15]
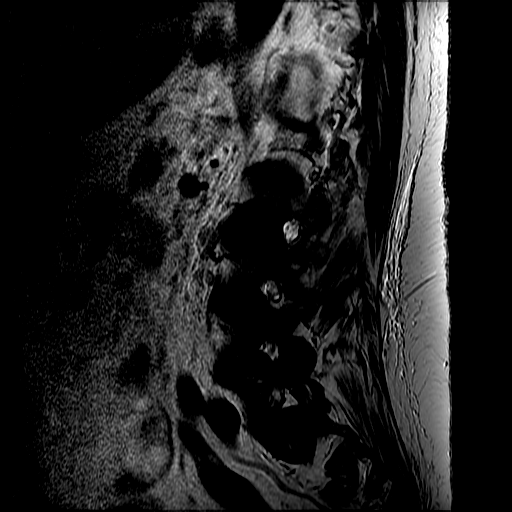
[im 15/15]
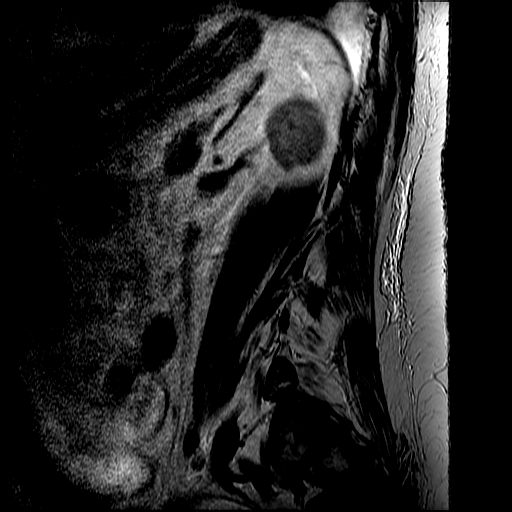

[Series 6: T2 · axial · 4.0mm · 0.39mm/px · z∈[-81,+67]mm · 5 of 34 slices shown]
[im 1/34]
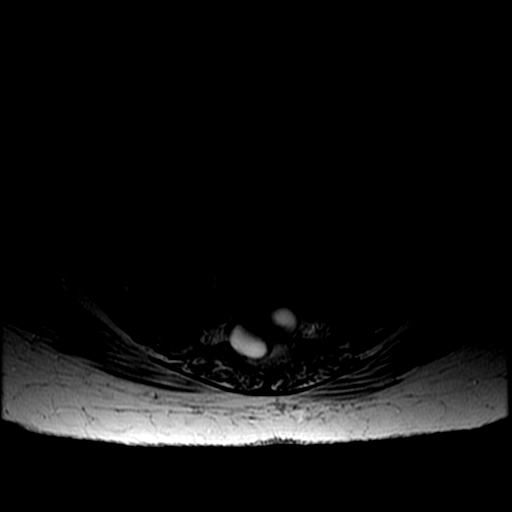
[im 6/34]
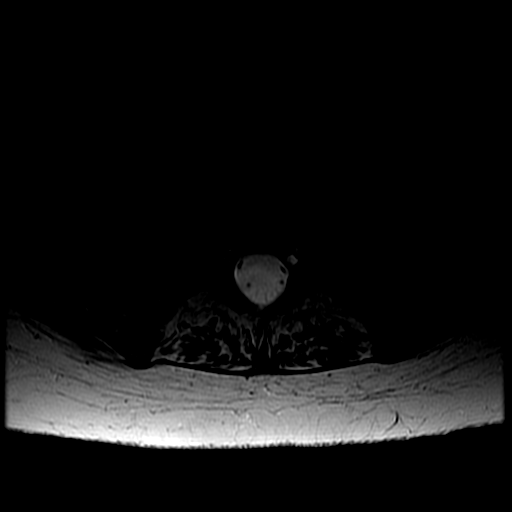
[im 11/34]
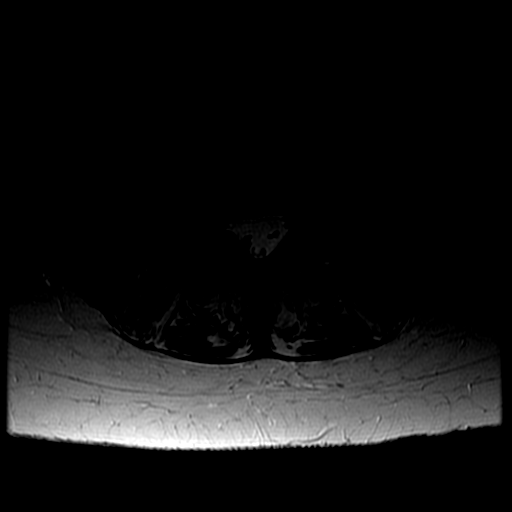
[im 18/34]
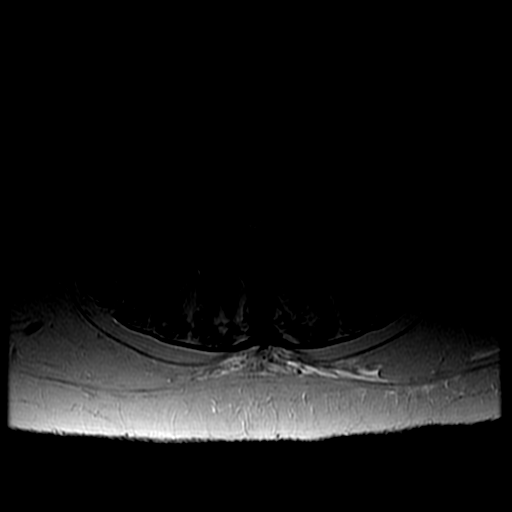
[im 28/34]
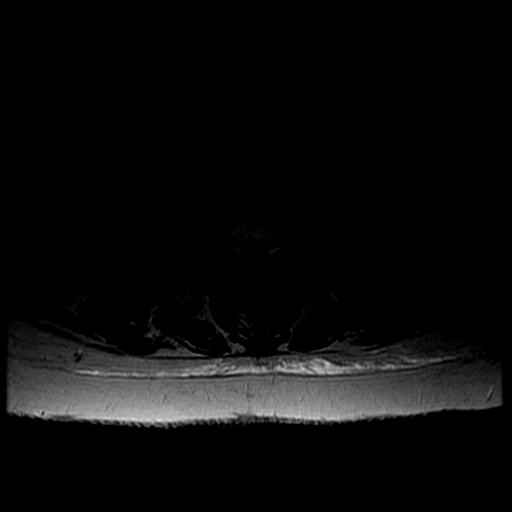

[Series 7: T1 · axial · 4.0mm · 0.39mm/px · z∈[-56,+67]mm · 3 of 34 slices shown (2 of 2)]
[im 6/34]
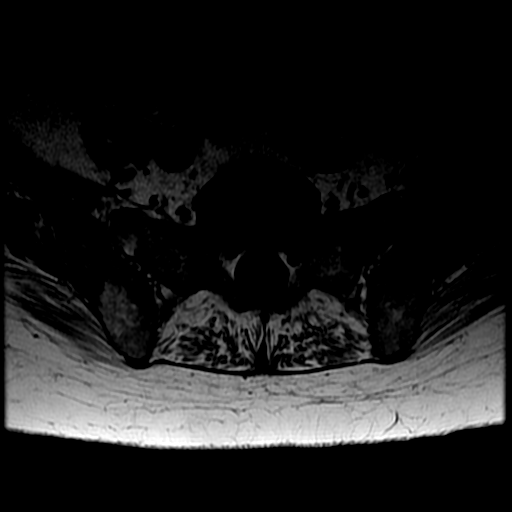
[im 18/34]
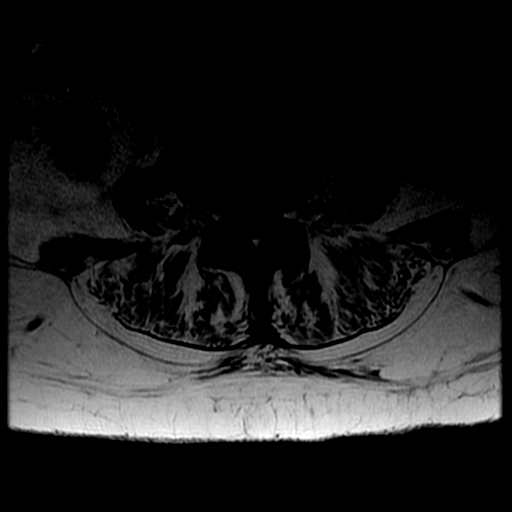
[im 28/34]
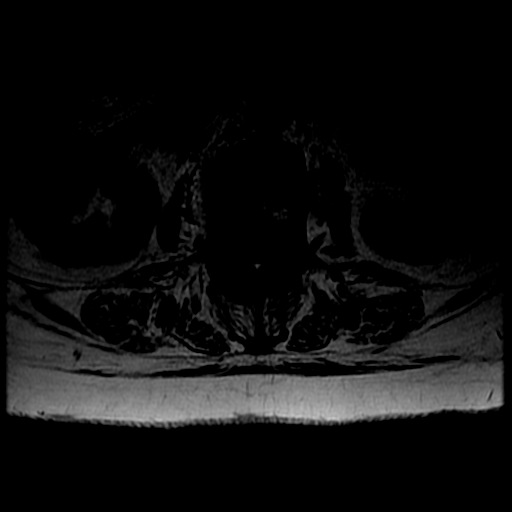

[18 of 48 positions shown; findings below may reference images not displayed]

FINDINGS: MRI THORACIC SPINE FINDINGS

Alignment:  Mild straightening of the thoracic alignment.

Vertebrae: No fracture. Marrow edema anteriorly at T11-12 which is
nonspecific. See below.

Cord:  No cord compression or primary cord lesion.

Paraspinal and other soft tissues: Small left effusion with
dependent left lung atelectasis or pneumonia.

Disc levels:

No significant finding from L1-2 through T10-11. Mild desiccation of
the discs with minimal bulges but no herniation or stenosis. No
edematous facet arthropathy.

At T11-12, as noted above, there is edema of within the anterior
inferior portion of the T11 vertebral body in the anterior superior
portion of the T12 vertebral body. There is degenerative disc
disease with small endplate irregularities. Usually, this is
discogenic. This can not be differentiated from early discitis
osteomyelitis.

T12-L1 shows mild degenerative change as discussed in the lumbar
section. There is mild T2 signal in the disc, but this is likely
degenerative.

MRI LUMBAR SPINE FINDINGS

Segmentation:  S1 is transitional.

Alignment:  Normal

Vertebrae:  No fracture or primary bone lesion.

Conus medullaris: Extends to the L2 level and appears normal.

Paraspinal and other soft tissues: No significant finding.

Disc levels:

L1-2: Disc degeneration with loss of height. Mild T2 signal in the
disc which can be degenerative. Minimal degenerative endplate
changes. Mild bulging of the disc. No stenosis or neural
compression.

L2-3: Endplate osteophytes and bulging of the disc. Mild narrowing
of the lateral recesses right more than left but no compressive
stenosis.

L3-4: Endplate osteophytes mild bulging of the disc. Mild narrowing
of the lateral recesses but no neural compression.

L4-5: Mild bulging of the disc. Mild facet hypertrophy. Mild
narrowing of the lateral recesses but no neural compression.

L5-S1: Endplate osteophytes and bulging of the disc. Mild facet
hypertrophy. No compressive stenosis.

S1-2:  Transitional level.  No disc pathology.  No stenosis
IMPRESSION: MR THORACIC SPINE IMPRESSION

Ordinary mild degenerative changes. Edema within the anterior
aspects of the T11 and T12 vertebral bodies that is favored to be
discogenic secondary to degenerative change. However, early discitis
osteomyelitis can have this appearance. If there is clinical
concern, follow-up would be suggested.

MR LUMBAR SPINE IMPRESSION

In the lumbar region, there is ordinary mild degenerative disc
disease and degenerative facet disease but no advanced pathology.
Mild lateral recess stenosis at L4-5.

## 2017-11-17 ENCOUNTER — Other Ambulatory Visit (HOSPITAL_COMMUNITY): Payer: Self-pay | Admitting: Internal Medicine

## 2017-11-27 ENCOUNTER — Other Ambulatory Visit: Payer: Self-pay | Admitting: Emergency Medicine

## 2018-01-20 DIAGNOSIS — I119 Hypertensive heart disease without heart failure: Secondary | ICD-10-CM | POA: Diagnosis not present

## 2018-01-20 DIAGNOSIS — E559 Vitamin D deficiency, unspecified: Secondary | ICD-10-CM | POA: Diagnosis not present

## 2018-01-20 DIAGNOSIS — I481 Persistent atrial fibrillation: Secondary | ICD-10-CM | POA: Diagnosis not present

## 2018-01-20 DIAGNOSIS — I348 Other nonrheumatic mitral valve disorders: Secondary | ICD-10-CM | POA: Diagnosis not present

## 2018-01-20 DIAGNOSIS — I10 Essential (primary) hypertension: Secondary | ICD-10-CM | POA: Diagnosis not present

## 2018-01-20 DIAGNOSIS — G4761 Periodic limb movement disorder: Secondary | ICD-10-CM | POA: Diagnosis not present

## 2018-01-20 DIAGNOSIS — Z79899 Other long term (current) drug therapy: Secondary | ICD-10-CM | POA: Diagnosis not present

## 2018-01-20 DIAGNOSIS — N182 Chronic kidney disease, stage 2 (mild): Secondary | ICD-10-CM | POA: Diagnosis not present

## 2018-01-20 DIAGNOSIS — F418 Other specified anxiety disorders: Secondary | ICD-10-CM | POA: Diagnosis not present

## 2018-01-20 DIAGNOSIS — Z1389 Encounter for screening for other disorder: Secondary | ICD-10-CM | POA: Diagnosis not present

## 2018-01-20 DIAGNOSIS — J45909 Unspecified asthma, uncomplicated: Secondary | ICD-10-CM | POA: Diagnosis not present

## 2018-01-20 DIAGNOSIS — Z0001 Encounter for general adult medical examination with abnormal findings: Secondary | ICD-10-CM | POA: Diagnosis not present

## 2018-01-23 ENCOUNTER — Other Ambulatory Visit (HOSPITAL_COMMUNITY): Payer: Self-pay | Admitting: Internal Medicine

## 2018-02-05 ENCOUNTER — Other Ambulatory Visit: Payer: Medicare Other

## 2018-02-14 ENCOUNTER — Other Ambulatory Visit (HOSPITAL_COMMUNITY): Payer: Self-pay | Admitting: Internal Medicine

## 2018-02-17 ENCOUNTER — Inpatient Hospital Stay: Payer: Medicare Other

## 2018-02-17 ENCOUNTER — Telehealth: Payer: Self-pay

## 2018-02-17 NOTE — Telephone Encounter (Signed)
Patient rescheduled her lab appointment for today until the 30 th. Wasn't feeling well today. Per 9/23 phone tree

## 2018-02-24 ENCOUNTER — Other Ambulatory Visit (HOSPITAL_COMMUNITY): Payer: Self-pay | Admitting: Internal Medicine

## 2018-02-24 ENCOUNTER — Inpatient Hospital Stay: Payer: Medicare Other | Attending: Oncology

## 2018-02-24 DIAGNOSIS — D594 Other nonautoimmune hemolytic anemias: Secondary | ICD-10-CM | POA: Diagnosis not present

## 2018-02-24 LAB — CBC WITH DIFFERENTIAL (CANCER CENTER ONLY)
Basophils Absolute: 0.1 10*3/uL (ref 0.0–0.1)
Basophils Relative: 1 %
EOS ABS: 0.4 10*3/uL (ref 0.0–0.5)
EOS PCT: 4 %
HCT: 28.1 % — ABNORMAL LOW (ref 34.8–46.6)
Hemoglobin: 8.7 g/dL — ABNORMAL LOW (ref 11.6–15.9)
LYMPHS ABS: 2.4 10*3/uL (ref 0.9–3.3)
Lymphocytes Relative: 20 %
MCH: 36.7 pg — AB (ref 25.1–34.0)
MCHC: 31 g/dL — ABNORMAL LOW (ref 31.5–36.0)
MCV: 118.6 fL — AB (ref 79.5–101.0)
MONO ABS: 2.4 10*3/uL — AB (ref 0.1–0.9)
MONOS PCT: 20 %
NEUTROS PCT: 55 %
NRBC: 1 /100{WBCs} — AB
Neutro Abs: 6.8 10*3/uL — ABNORMAL HIGH (ref 1.5–6.5)
PLATELETS: 510 10*3/uL — AB (ref 145–400)
RBC: 2.37 MIL/uL — ABNORMAL LOW (ref 3.70–5.45)
RDW: 14.6 % — ABNORMAL HIGH (ref 11.2–14.5)
WBC Count: 12.1 10*3/uL — ABNORMAL HIGH (ref 3.9–10.3)

## 2018-02-24 LAB — FERRITIN: Ferritin: 60 ng/mL (ref 11–307)

## 2018-03-07 ENCOUNTER — Other Ambulatory Visit (HOSPITAL_COMMUNITY): Payer: Self-pay | Admitting: Internal Medicine

## 2018-03-10 ENCOUNTER — Ambulatory Visit (HOSPITAL_COMMUNITY)
Admission: RE | Admit: 2018-03-10 | Discharge: 2018-03-10 | Disposition: A | Discharge status: Home / Self Care | Payer: Medicare Other | Source: Ambulatory Visit | Attending: Cardiology | Admitting: Cardiology

## 2018-03-10 ENCOUNTER — Encounter (HOSPITAL_COMMUNITY): Payer: Self-pay | Admitting: Cardiology

## 2018-03-10 ENCOUNTER — Telehealth (HOSPITAL_COMMUNITY): Payer: Self-pay | Admitting: *Deleted

## 2018-03-10 ENCOUNTER — Other Ambulatory Visit (HOSPITAL_COMMUNITY): Payer: Self-pay | Admitting: *Deleted

## 2018-03-10 VITALS — BP 110/60 | HR 99 | Wt 159.4 lb

## 2018-03-10 DIAGNOSIS — Z7901 Long term (current) use of anticoagulants: Secondary | ICD-10-CM | POA: Diagnosis not present

## 2018-03-10 DIAGNOSIS — K219 Gastro-esophageal reflux disease without esophagitis: Secondary | ICD-10-CM | POA: Diagnosis not present

## 2018-03-10 DIAGNOSIS — J9611 Chronic respiratory failure with hypoxia: Secondary | ICD-10-CM | POA: Diagnosis not present

## 2018-03-10 DIAGNOSIS — Z8249 Family history of ischemic heart disease and other diseases of the circulatory system: Secondary | ICD-10-CM | POA: Diagnosis not present

## 2018-03-10 DIAGNOSIS — G4733 Obstructive sleep apnea (adult) (pediatric): Secondary | ICD-10-CM | POA: Diagnosis not present

## 2018-03-10 DIAGNOSIS — Z803 Family history of malignant neoplasm of breast: Secondary | ICD-10-CM | POA: Diagnosis not present

## 2018-03-10 DIAGNOSIS — Z9981 Dependence on supplemental oxygen: Secondary | ICD-10-CM | POA: Insufficient documentation

## 2018-03-10 DIAGNOSIS — I2721 Secondary pulmonary arterial hypertension: Secondary | ICD-10-CM | POA: Insufficient documentation

## 2018-03-10 DIAGNOSIS — D649 Anemia, unspecified: Secondary | ICD-10-CM | POA: Insufficient documentation

## 2018-03-10 DIAGNOSIS — I5032 Chronic diastolic (congestive) heart failure: Secondary | ICD-10-CM | POA: Insufficient documentation

## 2018-03-10 DIAGNOSIS — Z79891 Long term (current) use of opiate analgesic: Secondary | ICD-10-CM | POA: Insufficient documentation

## 2018-03-10 DIAGNOSIS — Z79899 Other long term (current) drug therapy: Secondary | ICD-10-CM | POA: Insufficient documentation

## 2018-03-10 DIAGNOSIS — I110 Hypertensive heart disease with heart failure: Secondary | ICD-10-CM | POA: Insufficient documentation

## 2018-03-10 DIAGNOSIS — Z9989 Dependence on other enabling machines and devices: Secondary | ICD-10-CM | POA: Diagnosis not present

## 2018-03-10 DIAGNOSIS — I341 Nonrheumatic mitral (valve) prolapse: Secondary | ICD-10-CM | POA: Insufficient documentation

## 2018-03-10 DIAGNOSIS — I480 Paroxysmal atrial fibrillation: Secondary | ICD-10-CM | POA: Insufficient documentation

## 2018-03-10 DIAGNOSIS — J449 Chronic obstructive pulmonary disease, unspecified: Secondary | ICD-10-CM | POA: Insufficient documentation

## 2018-03-10 DIAGNOSIS — I482 Chronic atrial fibrillation, unspecified: Secondary | ICD-10-CM | POA: Diagnosis not present

## 2018-03-10 DIAGNOSIS — E669 Obesity, unspecified: Secondary | ICD-10-CM | POA: Insufficient documentation

## 2018-03-10 DIAGNOSIS — I272 Pulmonary hypertension, unspecified: Secondary | ICD-10-CM

## 2018-03-10 LAB — BASIC METABOLIC PANEL
Anion gap: 10 (ref 5–15)
BUN: 43 mg/dL — ABNORMAL HIGH (ref 8–23)
CALCIUM: 9.6 mg/dL (ref 8.9–10.3)
CO2: 32 mmol/L (ref 22–32)
Chloride: 93 mmol/L — ABNORMAL LOW (ref 98–111)
Creatinine, Ser: 1.68 mg/dL — ABNORMAL HIGH (ref 0.44–1.00)
GFR calc non Af Amer: 30 mL/min — ABNORMAL LOW (ref >60)
GFR, EST AFRICAN AMERICAN: 35 mL/min — AB (ref >60)
Glucose, Bld: 111 mg/dL — ABNORMAL HIGH (ref 70–99)
Potassium: 3.8 mmol/L (ref 3.5–5.1)
SODIUM: 135 mmol/L (ref 135–145)

## 2018-03-10 LAB — MAGNESIUM: Magnesium: 2.4 mg/dL (ref 1.7–2.4)

## 2018-03-10 NOTE — H&P (View-Only) (Signed)
Advanced Heart Failure Clinic Note   PCP: Dr Inda Merlin  HF: Dr. Haroldine Laws  Pulmonary: Dr Lamonte Sakai   HPI: Ms. Delira is a 67 y/o woman with h/o HTN, pyruvate kinase deficiency with hemolytic anemia, hemochromatosis, obesity, asthma, OSA (on CPAP), mitral valve prolapse s/p mini MV repair/Maze (2011 with Dr. [name]), PAF s/p RFA followed by surgical Maze 6295 and diastolic dysfunction.  Pre-op cath with normal coronaries in 2011. 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   Baseline hgb 7-9. Doesn't get transfused unless 6 or below.  Underwent DC-CV in 6/18  Today she returns as an add-on for fatigue with concern that she is back in afib. She took metolazone 2x over the weekend with weight gain up to 163 lbs. Overall doing poorly. She has been back in afib for at least 6 months. She says her PCP first noticed and was to contact Dr Haroldine Laws to let him know. She has been taking more metolazone since she has been back in afib, about twice per week. Over the last 10 days, she has become SOB with ADLs. She is also more fatigued and has palpitations. She is sometimes dizzy with activity. Her HR was up to 120s at rest last night. Denies edema, orthopnea, or PND. She has a dry cough. No fever or chills. No bleeding on Eliquis. No missed doses of any medications. Weights 155-166 lbs.   [place] 06/24/12 RA = 10  RV = 60/7/12  PA = 58/25 (40)  PCW = 19 (no significant v-waves)  Fick cardiac output/index = 6.6/3.7  PVR = 3.1 Woods  FA sat = 91%  PA sat = 66%, 67% PVR was not significantly elevated so selective pulmonary vasodilators were noted started.   RHC 6/16 RA = 17 PA = 74/31 (46) PCWP = 27 CI 3.89 PVR 2.8 WU  RHC 01/22/2017 RA = 10 RV = 74/14 PA = 74/27 (44) PCW = 23 (v waves to 40) Fick cardiac output/index = 6.1/3.8 PVR = 3.4 WU Ao sat = 98% PA sat = 68%, 69%  Echo 12/13 EF 60-65% mild LVH. Grade 2 DD. MVR stable (no MS noted). Moderate LAE. RV mildly dilated.  Mod-severe TR with estimated RVSP 80-90 range (in 2011 was 40-45 mm HG) ECHO 10/22/12 EF 55% RV mildly decreased + Septal bounce. Moderate TR. RVSP 65-70. +diastolic dysfunction (likely restrictive - but tissue Doppler not performed).  ECHO 05/17/2014: EF 50-55%  TEE (4/15): EF 60-65%, severe bilateral atrial enlargement, s/p MV ring with no MR and mild mitral stenosis with meean gradient 6, MVA 1.7 cm^2.  PASP on TTE in 4/15 was 60 mmHg.  Echo (6/16): EF 50-55%, moderate diastolic dysfunction, D-shaped interventricular septum, moderately dilated RV with mildly decreased systolic function, PA systolic pressure 60 mmHg, Echo 11/17 EF 55-60% RV dilated. D-shaped septum RVSP 55mmHG C not dilated, s/p MV repair with mild MS and trivial MR.   PFTs 2/14 FEV1 1.20L         FVC    1.68L         DLCO 36%  Review of systems complete and found to be negative unless listed in HPI.   Past Medical History:  Diagnosis Date  . Allergic rhinitis   . Anemia   . Asthma   . Asthma   . Atrial fibrillation ([place])    s/p RFA and Maze procedure - resolved  . Atrial tachycardia ([place])   . Complication of anesthesia    trouble  waking up  . Depression   . Dyspnea   . Exercise hypoxemia    supplemental O2 PRN  . Fatigue   . Gastritis    NSAID induced  . GERD (gastroesophageal reflux disease)   . Heart murmur    severe MR s/p MV repair  . Hx of asplenia    surgical  . Hypertension   . 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 Myoclonus   . Osteoporosis   . Pulmonary HTN ([name])    mild to moderate by [place] 05/2012  . Pyruvate kinase deficiency hemolytic anemia ([place])   . Syncope and collapse 2012   r/t anemia  . Trochanteric bursitis    left hip    Current Outpatient Medications  Medication Sig Dispense Refill  . acetaminophen (TYLENOL 8 HOUR) 650 MG CR tablet Take 1 tablet (650 mg total) by mouth every 8 (eight) hours as needed. 30 tablet 0  . albuterol  (PROVENTIL HFA;VENTOLIN HFA) 108 (90 Base) MCG/ACT inhaler Inhale 2 puffs into the lungs every 6 (six) hours as needed for wheezing or shortness of breath. 1 Inhaler 2  . augmented betamethasone dipropionate (DIPROLENE-AF) 0.05 % ointment Apply 1 application topically daily as needed for rash.    Marland Kitchen BREO ELLIPTA 100-25 MCG/INH AEPB INHALE 1 PUFF BY MOUTH EVERY DAY 60 each 5  . calcium-vitamin D (OSCAL WITH D) 500-200 MG-UNIT tablet Take 1 tablet by mouth 2 (two) times daily.    . chlorpheniramine (CHLOR-TRIMETON) 4 MG tablet Take 4 mg by mouth at bedtime.    . cholecalciferol (VITAMIN D) 1000 UNITS tablet Take 1,000 Units by mouth 2 (two) times daily.     . clonazePAM (KLONOPIN) 1 MG tablet Take 2 mg by mouth at bedtime.     Marland Kitchen deferasirox (EXJADE) 500 MG disintegrating tablet Take 3 tablets (1,500 mg total) by mouth daily before breakfast. Faxed to [employer] [facility] [phone] 90 tablet 3  . dextromethorphan (DELSYM) 30 MG/5ML liquid Take 60 mg by mouth at bedtime as needed for cough.    . diclofenac sodium (VOLTAREN) 1 % GEL Apply 2 g topically 4 (four) times daily as needed. (Patient taking differently: Apply 2 g topically 4 (four) times daily as needed (for pain). ) 100 g 0  . diltiazem (CARDIZEM CD) 240 MG 24 hr capsule Take 1 capsule (240 mg total) by mouth daily. 90 capsule 3  . ELIQUIS 5 MG TABS tablet TAKE 1 TABLET BY MOUTH TWICE A DAY 60 tablet 5  . EPINEPHrine (EPI-PEN) 0.3 mg/0.3 mL DEVI Inject 0.3 mg into the muscle once as needed (for allergic reaction).     Marland Kitchen escitalopram (LEXAPRO) 10 MG tablet Take 10 mg by mouth at bedtime.     . fexofenadine (ALLEGRA) 180 MG tablet Take 180 mg by mouth daily after breakfast.     . fluticasone (FLONASE) 50 MCG/ACT nasal spray Place 1 spray into both nostrils daily after breakfast.     . folic acid (FOLVITE) 166 MCG tablet Take 400 mcg by mouth daily after breakfast.     . KLOR-CON M20 20 MEQ tablet TAKE 3 TABLETS (60 MEQ TOTAL)  3 (THREE) TIMES DAILY BY MOUTH. 810 tablet 0  . metolazone (ZAROXOLYN) 2.5 MG tablet Take 1 tablet (2.5 mg total) by mouth daily as needed (arthritis). 30 tablet 1  . metoprolol succinate (TOPROL-XL) 50 MG 24 hr tablet Take 1 tablet (50 mg total) by mouth 2 (two) times daily.  Take with or immediately following a meal. 180 tablet 1  . Multiple Vitamins-Minerals (AIRBORNE) TBEF Take 1 tablet by mouth 2 (two) times daily. *Dissolves in water twice a day*    . OXYGEN Inhale 3 L into the lungs continuous.     . pantoprazole (PROTONIX) 40 MG tablet Take 1 tablet (40 mg total) by mouth daily before breakfast. 30 tablet 5  . raloxifene (EVISTA) 60 MG tablet Take 60 mg by mouth daily with breakfast.     . senna-docusate (SENOKOT-S) 8.6-50 MG tablet Take 1 tablet by mouth at bedtime as needed for mild constipation. 30 tablet 0  . sodium chloride (OCEAN) 0.65 % SOLN nasal spray Place 1 spray into both nostrils 2 (two) times daily.     Marland Kitchen spironolactone (ALDACTONE) 25 MG tablet TAKE 1 TABLET BY MOUTH EVERY DAY 90 tablet 0  . torsemide (DEMADEX) 20 MG tablet TAKE 2 TABLETS (40 MG TOTAL) BY MOUTH 2 (TWO) TIMES DAILY. 360 tablet 2  . traMADol (ULTRAM) 50 MG tablet Take 50 mg by mouth 4 (four) times daily.     No current facility-administered medications for this encounter.    Social History   Tobacco Use  . Smoking status: Never Smoker  . Smokeless tobacco: Never Used  Substance Use Topics  . Alcohol use: Yes    Comment: socially glass red wine monthly  . Drug use: No   Family History  Problem Relation Age of Onset  . Breast cancer Mother 60       ER+  . Melanoma Father 76  . Adrenal disorder Sister        pheochromacytoma - rt. adrenal gland  . COPD Maternal Aunt   . Stomach cancer Paternal Uncle        onset in 52s  . Lung cancer Maternal Grandmother        died late 11s, heavy smoker  . Heart disease Maternal Grandfather        hardening of the arteries, poor circulation  . Ovarian cancer  Sister 66       stage IV, now 50    There were no vitals filed for this visit. Wt Readings from Last 3 Encounters:  11/05/17 72.1 kg (159 lb)  11/05/17 72.4 kg (159 lb 11.2 oz)  09/24/17 71.7 kg (158 lb)    PHYSICAL EXAM: General: Fatigued appearing. No resp difficulty. HEENT: Normal Neck: Supple. JVP 6-7. Carotids 2+ bilat; no bruits. No thyromegaly or nodule noted. Cor: PMI nondisplaced. RRR, No M/G/R noted Lungs: CTAB, normal effort. Abdomen: Soft, non-tender, non-distended, no HSM. No bruits or masses. +BS  Extremities: No cyanosis, clubbing, or rash. R and LLE no edema.  Neuro: Alert & orientedx3, cranial nerves grossly intact. moves all 4 extremities w/o difficulty. Affect pleasant  ECG: Afib 95 bpm. Personally reviewed.    ASSESSMENT & PLAN:  1. Atrial fibrillation, paroxysmal:  - s/p DC-CV 6/18 - EKG today shows afib. Continue Eliquis. She has not missed any doses. - Continue Toprol XL 50 mg BID and diltiazem CD 240 mg daily - Set up for DCCV with Dr Haroldine Laws on Friday. Will refer her to afib clinic today. Consider low dose amio vs tikosyn if DCCV doesn't hold and she remains symptomatic.  2. Pulmonary arterial hypertension: PVR was only 2.8 WU on 6/16 RHC. The PAH appears to be primarily Group 3 in the setting of OSA and chronic respiratory failure (on home oxygen, ?OHS).  Echo 6/16 showed that the RV is moderately dilated  with mildly decreased systolic function. ECHO 03/2016 EF 55-60% Peak PA pressure 86 mm hg.   - Remains NYHA IIIb.  On 3 liters oxygen.  - Continue CPAP at night and oxygen during the day. No change.  - Stressed need to continue with exercise program after finishing Pulmonary Rehab   3. Chronic diastolic CHF: Echo 42/8768 EF 55-60%, RV normal, PA peak pressure 86 - Volume status okay on exam.  - Continue torsemide 40 mg BID. Continue metolazone 2.5 mg PRN - Reinforced fluid restriction to < 2 L daily, sodium restriction to less than 2000 mg daily,  and the importance of daily weights.    4. OHS/OSA:  - Continue nightly CPAP.  No change.  5. 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   - Follows with Dr. Benay Spice.   6. S/p MV repair:  - Stable with mild residual MS.   BMET, mag today.  Set up for DCCV with Dr Haroldine Laws on Friday Refer to afib clinic Follow up 6-8 weeks  Georgiana Shore, NP  03/10/2018  Greater than 50% of the 25 minute visit was spent in counseling/coordination of care regarding disease state education, salt/fluid restriction, sliding scale diuretics, and medication compliance.   Patient seen and examined with the above-signed Advanced Practice Provider and/or Housestaff. I personally reviewed laboratory data, imaging studies and relevant notes. I independently examined the patient and formulated the important aspects of the plan. I have edited the note to reflect any of my changes or salient points. I have personally discussed the plan with the patient and/or family.  67 y/o woman with multiple medical problems including COPD on home O2, MV disease s/p MVR with Maze, PAF and diastolic HF. Presents with progressive fatigue and more difficulty managing her HF. Work-up shows recurrent AF which likely has been present for several months per history. Rate is well controlled here but she reports resting HRs up to 120 at home.   Long discussion with her and her daughter about rate control versus rhythm control. Symptomatically she seems to do much better with rhythm control but may be hard to maintain NSR. Not great candidate for amiodarone with lung disease. We did discuss the possibility of Tikosyn acknowledging the risk of torsades. Will arrange DC-CV for later in the week and see if she can hold NSR. I told her that I would favor avoiding AA therapy for as long as possible as long as we could get close to a year between DC-CVs but I doubt this will be the case for long. We will also refer her to AF Clinic  and get their input.   Overall volume status looks ok today.   Glori Bickers, MD  10:17 PM

## 2018-03-10 NOTE — Progress Notes (Signed)
Advanced Heart Failure Clinic Note   PCP: Dr Inda Merlin  HF: Dr. Haroldine Laws  Pulmonary: Dr Lamonte Sakai   HPI: [name] is a 67 y/o woman with h/o HTN, pyruvate kinase deficiency with hemolytic anemia, hemochromatosis, obesity, asthma, OSA (on CPAP), mitral valve prolapse s/p mini MV repair/Maze (2011 with Dr. [name]), PAF s/p RFA followed by surgical Maze 2878 and diastolic dysfunction.  Pre-op cath with normal coronaries in 2011. 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   Baseline hgb 7-9. Doesn't get transfused unless 6 or below.  Underwent DC-CV in 6/18  Today she returns as an add-on for fatigue with concern that she is back in afib. She took metolazone 2x over the weekend with weight gain up to 163 lbs. Overall doing poorly. She has been back in afib for at least 6 months. She says her PCP first noticed and was to contact Dr Haroldine Laws to let him know. She has been taking more metolazone since she has been back in afib, about twice per week. Over the last 10 days, she has become SOB with ADLs. She is also more fatigued and has palpitations. She is sometimes dizzy with activity. Her HR was up to 120s at rest last night. Denies edema, orthopnea, or PND. She has a dry cough. No fever or chills. No bleeding on Eliquis. No missed doses of any medications. Weights 155-166 lbs.   [place] 06/24/12 RA = 10  RV = 60/7/12  PA = 58/25 (40)  PCW = 19 (no significant v-waves)  Fick cardiac output/index = 6.6/3.7  PVR = 3.1 [place]  FA sat = 91%  PA sat = 66%, 67% PVR was not significantly elevated so selective pulmonary vasodilators were noted started.   RHC 6/16 RA = 17 PA = 74/31 (46) PCWP = 27 CI 3.89 PVR 2.8 WU  RHC 01/22/2017 RA = 10 RV = 74/14 PA = 74/27 (44) PCW = 23 (v waves to 40) Fick cardiac output/index = 6.1/3.8 PVR = 3.4 WU Ao sat = 98% PA sat = 68%, 69%  Echo 12/13 EF 60-65% mild LVH. Grade 2 DD. MVR stable (no MS noted). Moderate LAE. RV mildly dilated.  Mod-severe TR with estimated RVSP 80-90 range (in 2011 was 40-45 mm HG) ECHO 10/22/12 EF 55% RV mildly decreased + Septal bounce. Moderate TR. RVSP 65-70. +diastolic dysfunction (likely restrictive - but tissue Doppler not performed).  ECHO 05/17/2014: EF 50-55%  TEE (4/15): EF 60-65%, severe bilateral atrial enlargement, s/p MV ring with no MR and mild mitral stenosis with meean gradient 6, MVA 1.7 cm^2.  PASP on TTE in 4/15 was 60 mmHg.  Echo (6/16): EF 50-55%, moderate diastolic dysfunction, D-shaped interventricular septum, moderately dilated RV with mildly decreased systolic function, PA systolic pressure 60 mmHg, Echo 11/17 EF 55-60% RV dilated. D-shaped septum RVSP 23mmHG C not dilated, s/p MV repair with mild MS and trivial MR.   PFTs 2/14 FEV1 1.20L         FVC    1.68L         DLCO 36%  Review of systems complete and found to be negative unless listed in HPI.   Past Medical History:  Diagnosis Date  . Allergic rhinitis   . Anemia   . Asthma   . Asthma   . Atrial fibrillation ([place])    s/p RFA and Maze procedure - resolved  . Atrial tachycardia ([place])   . Complication of anesthesia    trouble  waking up  . Depression   . Dyspnea   . Exercise hypoxemia    supplemental O2 PRN  . Fatigue   . Gastritis    NSAID induced  . GERD (gastroesophageal reflux disease)   . Heart murmur    severe MR s/p MV repair  . Hx of asplenia    surgical  . Hypertension   . 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 Myoclonus   . Osteoporosis   . Pulmonary HTN ([place])    mild to moderate by [place] 05/2012  . Pyruvate kinase deficiency hemolytic anemia ([place])   . Syncope and collapse 2012   r/t anemia  . Trochanteric bursitis    left hip    Current Outpatient Medications  Medication Sig Dispense Refill  . acetaminophen (TYLENOL 8 HOUR) 650 MG CR tablet Take 1 tablet (650 mg total) by mouth every 8 (eight) hours as needed. 30 tablet 0  . albuterol  (PROVENTIL HFA;VENTOLIN HFA) 108 (90 Base) MCG/ACT inhaler Inhale 2 puffs into the lungs every 6 (six) hours as needed for wheezing or shortness of breath. 1 Inhaler 2  . augmented betamethasone dipropionate (DIPROLENE-AF) 0.05 % ointment Apply 1 application topically daily as needed for rash.    Marland Kitchen BREO ELLIPTA 100-25 MCG/INH AEPB INHALE 1 PUFF BY MOUTH EVERY DAY 60 each 5  . calcium-vitamin D (OSCAL WITH D) 500-200 MG-UNIT tablet Take 1 tablet by mouth 2 (two) times daily.    . chlorpheniramine (CHLOR-TRIMETON) 4 MG tablet Take 4 mg by mouth at bedtime.    . cholecalciferol (VITAMIN D) 1000 UNITS tablet Take 1,000 Units by mouth 2 (two) times daily.     . clonazePAM (KLONOPIN) 1 MG tablet Take 2 mg by mouth at bedtime.     Marland Kitchen deferasirox (EXJADE) 500 MG disintegrating tablet Take 3 tablets (1,500 mg total) by mouth daily before breakfast. Faxed to [employer] [phone] 90 tablet 3  . dextromethorphan (DELSYM) 30 MG/5ML liquid Take 60 mg by mouth at bedtime as needed for cough.    . diclofenac sodium (VOLTAREN) 1 % GEL Apply 2 g topically 4 (four) times daily as needed. (Patient taking differently: Apply 2 g topically 4 (four) times daily as needed (for pain). ) 100 g 0  . diltiazem (CARDIZEM CD) 240 MG 24 hr capsule Take 1 capsule (240 mg total) by mouth daily. 90 capsule 3  . ELIQUIS 5 MG TABS tablet TAKE 1 TABLET BY MOUTH TWICE A DAY 60 tablet 5  . EPINEPHrine (EPI-PEN) 0.3 mg/0.3 mL DEVI Inject 0.3 mg into the muscle once as needed (for allergic reaction).     Marland Kitchen escitalopram (LEXAPRO) 10 MG tablet Take 10 mg by mouth at bedtime.     . fexofenadine (ALLEGRA) 180 MG tablet Take 180 mg by mouth daily after breakfast.     . fluticasone (FLONASE) 50 MCG/ACT nasal spray Place 1 spray into both nostrils daily after breakfast.     . folic acid (FOLVITE) 098 MCG tablet Take 400 mcg by mouth daily after breakfast.     . KLOR-CON M20 20 MEQ tablet TAKE 3 TABLETS (60 MEQ TOTAL)  3 (THREE) TIMES DAILY BY MOUTH. 810 tablet 0  . metolazone (ZAROXOLYN) 2.5 MG tablet Take 1 tablet (2.5 mg total) by mouth daily as needed (arthritis). 30 tablet 1  . metoprolol succinate (TOPROL-XL) 50 MG 24 hr tablet Take 1 tablet (50 mg total) by mouth 2 (two) times daily.  Take with or immediately following a meal. 180 tablet 1  . Multiple Vitamins-Minerals (AIRBORNE) TBEF Take 1 tablet by mouth 2 (two) times daily. *Dissolves in water twice a day*    . OXYGEN Inhale 3 L into the lungs continuous.     . pantoprazole (PROTONIX) 40 MG tablet Take 1 tablet (40 mg total) by mouth daily before breakfast. 30 tablet 5  . raloxifene (EVISTA) 60 MG tablet Take 60 mg by mouth daily with breakfast.     . senna-docusate (SENOKOT-S) 8.6-50 MG tablet Take 1 tablet by mouth at bedtime as needed for mild constipation. 30 tablet 0  . sodium chloride (OCEAN) 0.65 % SOLN nasal spray Place 1 spray into both nostrils 2 (two) times daily.     Marland Kitchen spironolactone (ALDACTONE) 25 MG tablet TAKE 1 TABLET BY MOUTH EVERY DAY 90 tablet 0  . torsemide (DEMADEX) 20 MG tablet TAKE 2 TABLETS (40 MG TOTAL) BY MOUTH 2 (TWO) TIMES DAILY. 360 tablet 2  . traMADol (ULTRAM) 50 MG tablet Take 50 mg by mouth 4 (four) times daily.     No current facility-administered medications for this encounter.    Social History   Tobacco Use  . Smoking status: Never Smoker  . Smokeless tobacco: Never Used  Substance Use Topics  . Alcohol use: Yes    Comment: socially glass red wine monthly  . Drug use: No   Family History  Problem Relation Age of Onset  . Breast cancer Mother 81       ER+  . Melanoma Father 32  . Adrenal disorder Sister        pheochromacytoma - rt. adrenal gland  . COPD Maternal Aunt   . Stomach cancer Paternal Uncle        onset in 50s  . Lung cancer Maternal Grandmother        died late 26s, heavy smoker  . Heart disease Maternal Grandfather        hardening of the arteries, poor circulation  . Ovarian cancer  Sister 55       stage IV, now 50    There were no vitals filed for this visit. Wt Readings from Last 3 Encounters:  11/05/17 72.1 kg (159 lb)  11/05/17 72.4 kg (159 lb 11.2 oz)  09/24/17 71.7 kg (158 lb)    PHYSICAL EXAM: General: Fatigued appearing. No resp difficulty. HEENT: Normal Neck: Supple. JVP 6-7. Carotids 2+ bilat; no bruits. No thyromegaly or nodule noted. Cor: PMI nondisplaced. RRR, No M/G/R noted Lungs: CTAB, normal effort. Abdomen: Soft, non-tender, non-distended, no HSM. No bruits or masses. +BS  Extremities: No cyanosis, clubbing, or rash. R and LLE no edema.  Neuro: Alert & orientedx3, cranial nerves grossly intact. moves all 4 extremities w/o difficulty. Affect pleasant  ECG: Afib 95 bpm. Personally reviewed.    ASSESSMENT & PLAN:  1. Atrial fibrillation, paroxysmal:  - s/p DC-CV 6/18 - EKG today shows afib. Continue Eliquis. She has not missed any doses. - Continue Toprol XL 50 mg BID and diltiazem CD 240 mg daily - Set up for DCCV with Dr Haroldine Laws on Friday. Will refer her to afib clinic today. Consider low dose amio vs tikosyn if DCCV doesn't hold and she remains symptomatic.  2. Pulmonary arterial hypertension: PVR was only 2.8 WU on 6/16 RHC. The PAH appears to be primarily Group 3 in the setting of OSA and chronic respiratory failure (on home oxygen, ?OHS).  Echo 6/16 showed that the RV is moderately dilated  with mildly decreased systolic function. ECHO 03/2016 EF 55-60% Peak PA pressure 86 mm hg.   - Remains NYHA IIIb.  On 3 liters oxygen.  - Continue CPAP at night and oxygen during the day. No change.  - Stressed need to continue with exercise program after finishing Pulmonary Rehab   3. Chronic diastolic CHF: Echo 77/9390 EF 55-60%, RV normal, PA peak pressure 86 - Volume status okay on exam.  - Continue torsemide 40 mg BID. Continue metolazone 2.5 mg PRN - Reinforced fluid restriction to < 2 L daily, sodium restriction to less than 2000 mg daily,  and the importance of daily weights.    4. OHS/OSA:  - Continue nightly CPAP.  No change.  5. 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   - Follows with Dr. Benay Spice.   6. S/p MV repair:  - Stable with mild residual MS.   BMET, mag today.  Set up for DCCV with Dr Haroldine Laws on Friday Refer to afib clinic Follow up 6-8 weeks  Georgiana Shore, NP  03/10/2018  Greater than 50% of the 25 minute visit was spent in counseling/coordination of care regarding disease state education, salt/fluid restriction, sliding scale diuretics, and medication compliance.   Patient seen and examined with the above-signed Advanced Practice Provider and/or Housestaff. I personally reviewed laboratory data, imaging studies and relevant notes. I independently examined the patient and formulated the important aspects of the plan. I have edited the note to reflect any of my changes or salient points. I have personally discussed the plan with the patient and/or family.  67 y/o woman with multiple medical problems including COPD on home O2, MV disease s/p MVR with Maze, PAF and diastolic HF. Presents with progressive fatigue and more difficulty managing her HF. Work-up shows recurrent AF which likely has been present for several months per history. Rate is well controlled here but she reports resting HRs up to 120 at home.   Long discussion with her and her daughter about rate control versus rhythm control. Symptomatically she seems to do much better with rhythm control but may be hard to maintain NSR. Not great candidate for amiodarone with lung disease. We did discuss the possibility of Tikosyn acknowledging the risk of torsades. Will arrange DC-CV for later in the week and see if she can hold NSR. I told her that I would favor avoiding AA therapy for as long as possible as long as we could get close to a year between DC-CVs but I doubt this will be the case for long. We will also refer her to AF Clinic  and get their input.   Overall volume status looks ok today.   Glori Bickers, MD  10:17 PM

## 2018-03-10 NOTE — Telephone Encounter (Signed)
Patient called stating she has never been symptomatic with afib but now she feels "horrible". Patient says she can barely keep her eyes open, she feels her heart out of rhythm, and her weight has been fluctuating. Patient was told to take Metolazone if her weight was 159lbs or greater.  She took Metolazone on 10/11 and  10/12 because her weight was up to 163lbs her weight only went down to 159lbs. Patient also c/o loss of appetite and denies excessive fluid intake. She does not notice any swelling/edema in feet and ankles. Per Nira Conn add her to the APP. I spoke with Holley Bouche who agreed to adding patient to her schedule today at 1:30pm. Pt aware and agreeable.

## 2018-03-10 NOTE — Progress Notes (Signed)
dccv letter

## 2018-03-10 NOTE — Patient Instructions (Addendum)
You have been referred to [facility]   Your physician has recommended that you have a Cardioversion (DCCV). Electrical Cardioversion uses a jolt of electricity to your heart either through paddles or wired patches attached to your chest. This is a controlled, usually prescheduled, procedure. Defibrillation is done under light anesthesia in the hospital, and you usually go home the day of the procedure. This is done to get your heart back into a normal rhythm. You are not awake for the procedure. Please see the instruction sheet given to you today.  Your physician recommends that you schedule a follow-up appointment in: 6-8 weeks  Labs today We will only contact you if something comes back abnormal or we need to make some changes. Otherwise no news is good news!

## 2018-03-14 ENCOUNTER — Ambulatory Visit (HOSPITAL_COMMUNITY): Payer: Medicare Other | Admitting: Anesthesiology

## 2018-03-14 ENCOUNTER — Encounter (HOSPITAL_COMMUNITY)
Admission: RE | Disposition: A | Discharge status: Home / Self Care | Payer: Self-pay | Source: Ambulatory Visit | Attending: Internal Medicine

## 2018-03-14 ENCOUNTER — Ambulatory Visit (HOSPITAL_COMMUNITY)
Admission: RE | Admit: 2018-03-14 | Discharge: 2018-03-14 | Disposition: A | Discharge status: Home / Self Care | Payer: Medicare Other | Source: Ambulatory Visit | Attending: Internal Medicine | Admitting: Internal Medicine

## 2018-03-14 ENCOUNTER — Encounter (HOSPITAL_COMMUNITY): Payer: Self-pay | Admitting: *Deleted

## 2018-03-14 DIAGNOSIS — I110 Hypertensive heart disease with heart failure: Secondary | ICD-10-CM | POA: Diagnosis not present

## 2018-03-14 DIAGNOSIS — I5032 Chronic diastolic (congestive) heart failure: Secondary | ICD-10-CM | POA: Diagnosis not present

## 2018-03-14 DIAGNOSIS — D649 Anemia, unspecified: Secondary | ICD-10-CM | POA: Insufficient documentation

## 2018-03-14 DIAGNOSIS — I2721 Secondary pulmonary arterial hypertension: Secondary | ICD-10-CM | POA: Insufficient documentation

## 2018-03-14 DIAGNOSIS — Z6828 Body mass index (BMI) 28.0-28.9, adult: Secondary | ICD-10-CM | POA: Insufficient documentation

## 2018-03-14 DIAGNOSIS — E669 Obesity, unspecified: Secondary | ICD-10-CM | POA: Insufficient documentation

## 2018-03-14 DIAGNOSIS — I480 Paroxysmal atrial fibrillation: Secondary | ICD-10-CM | POA: Insufficient documentation

## 2018-03-14 DIAGNOSIS — Z7901 Long term (current) use of anticoagulants: Secondary | ICD-10-CM | POA: Diagnosis not present

## 2018-03-14 DIAGNOSIS — I4891 Unspecified atrial fibrillation: Secondary | ICD-10-CM

## 2018-03-14 DIAGNOSIS — F329 Major depressive disorder, single episode, unspecified: Secondary | ICD-10-CM | POA: Diagnosis not present

## 2018-03-14 DIAGNOSIS — K219 Gastro-esophageal reflux disease without esophagitis: Secondary | ICD-10-CM | POA: Insufficient documentation

## 2018-03-14 DIAGNOSIS — M810 Age-related osteoporosis without current pathological fracture: Secondary | ICD-10-CM | POA: Insufficient documentation

## 2018-03-14 DIAGNOSIS — Z9981 Dependence on supplemental oxygen: Secondary | ICD-10-CM | POA: Diagnosis not present

## 2018-03-14 DIAGNOSIS — G4733 Obstructive sleep apnea (adult) (pediatric): Secondary | ICD-10-CM | POA: Diagnosis not present

## 2018-03-14 DIAGNOSIS — J449 Chronic obstructive pulmonary disease, unspecified: Secondary | ICD-10-CM | POA: Insufficient documentation

## 2018-03-14 HISTORY — PX: CARDIOVERSION: SHX1299

## 2018-03-14 SURGERY — CARDIOVERSION
Anesthesia: Monitor Anesthesia Care

## 2018-03-14 MED ORDER — SODIUM CHLORIDE 0.9 % IV SOLN
250.0000 mL | INTRAVENOUS | Status: DC | PRN
  Administered 2018-03-14: 11:00:00 via INTRAVENOUS

## 2018-03-14 MED ORDER — SODIUM CHLORIDE 0.9 % IV SOLN: INTRAVENOUS | Status: DC

## 2018-03-14 MED ORDER — SODIUM CHLORIDE 0.9% FLUSH: 3.0000 mL | Freq: Two times a day (BID) | INTRAVENOUS | Status: DC

## 2018-03-14 MED ORDER — SODIUM CHLORIDE 0.9% FLUSH: 3.0000 mL | INTRAVENOUS | Status: DC | PRN

## 2018-03-14 NOTE — Anesthesia Preprocedure Evaluation (Signed)
Anesthesia Evaluation  Patient identified by MRN, date of birth, ID band Patient awake    Reviewed: Allergy & Precautions, NPO status , Patient's Chart, lab work & pertinent test results  Airway Mallampati: II  TM Distance: >3 FB Neck ROM: Full    Dental no notable dental hx. (+) Teeth Intact, Dental Advisory Given   Pulmonary asthma , sleep apnea ,    Pulmonary exam normal breath sounds clear to auscultation       Cardiovascular hypertension, Pt. on medications +CHF  Normal cardiovascular exam Rhythm:Regular Rate:Normal  ECHO 04/04/2016 Left ventricle: The cavity size was normal. Systolic function was   normal. The estimated ejection fraction was in the range of 55%   to 60%. Wall motion was normal; there were no regional wall   motion abnormalities. The study is not technically sufficient to   allow evaluation of LV diastolic function. Doppler parameters are   consistent with high ventricular filling pressure. - Aortic valve: Transvalvular velocity was within the normal range.   There was no stenosis. There was no regurgitation. - Mitral valve: Prior procedures included surgical repair. The   findings are consistent with mild stenosis. Valve area by   pressure half-time: 1.58 cm^2. Valve area by continuity equation   (using LVOT flow): 1.03 cm^2. - Left atrium: The atrium was severely dilated. - Right atrium: The atrium was severely dilated. - Tricuspid valve: There was moderate-severe regurgitation. - Pulmonary arteries: Systolic pressure was severely increased. PA   peak pressure: 86 mm Hg (S).    Neuro/Psych  Neuromuscular disease    GI/Hepatic Neg liver ROS, GERD  ,  Endo/Other  negative endocrine ROS  Renal/GU negative Renal ROS     Musculoskeletal negative musculoskeletal ROS (+)   Abdominal   Peds  Hematology  (+) anemia ,   Anesthesia Other Findings Pyruvate kinase deficiency  Reproductive/Obstetrics                             Anesthesia Physical Anesthesia Plan  ASA: III  Anesthesia Plan: MAC   Post-op Pain Management:    Induction: Intravenous  PONV Risk Score and Plan: Treatment may vary due to age or medical condition  Airway Management Planned: Natural Airway and Nasal Cannula  Additional Equipment:   Intra-op Plan:   Post-operative Plan:   Informed Consent: I have reviewed the patients History and Physical, chart, labs and discussed the procedure including the risks, benefits and alternatives for the proposed anesthesia with the patient or authorized representative who has indicated his/her understanding and acceptance.   Dental advisory given  Plan Discussed with: CRNA  Anesthesia Plan Comments:         Anesthesia Quick Evaluation

## 2018-03-14 NOTE — CV Procedure (Signed)
    DIRECT CURRENT CARDIOVERSION  NAME:  [name]   MRN: [id number] DOB:  07-12-1950   ADMIT DATE: 03/14/2018   INDICATIONS: Atrial fibrillation    PROCEDURE:   Informed consent was obtained prior to the procedure. The risks, benefits and alternatives for the procedure were discussed and the patient comprehended these risks. Once an appropriate time out was taken, the patient had the defibrillator pads placed in the anterior and posterior position. The patient then underwent sedation by the anesthesia service. Once an appropriate level of sedation was achieved, the patient received a single biphasic, synchronized 200J shock with prompt conversion to sinus rhythm. No apparent complications.  Glori Bickers, MD  11:37 AM

## 2018-03-14 NOTE — Interval H&P Note (Signed)
History and Physical Interval Note:  03/14/2018 11:38 AM  [name]  has presented today for surgery, with the diagnosis of AFIB  The various methods of treatment have been discussed with the patient and family. After consideration of risks, benefits and other options for treatment, the patient has consented to  Procedure(s): CARDIOVERSION (N/A) as a surgical intervention .  The patient's history has been reviewed, patient examined, no change in status, stable for surgery.  I have reviewed the patient's chart and labs.  Questions were answered to the patient's satisfaction.     Deshayla Empson

## 2018-03-14 NOTE — Transfer of Care (Signed)
Immediate Anesthesia Transfer of Care Note  Patient: [name]  Procedure(s) Performed: CARDIOVERSION (N/A )  Patient Location: Endoscopy Unit  Anesthesia Type:General  Level of Consciousness: awake and alert   Airway & Oxygen Therapy: Patient Spontanous Breathing and Patient connected to nasal cannula oxygen  Post-op Assessment: Report given to RN and Post -op Vital signs reviewed and stable  Post vital signs: Reviewed and stable  Last Vitals:  Vitals Value Taken Time  BP 82/45 03/14/2018 11:47 AM  Temp    Pulse 59 03/14/2018 11:49 AM  Resp 18 03/14/2018 11:49 AM  SpO2 95 % 03/14/2018 11:49 AM    Last Pain:  Vitals:   03/14/18 1001  TempSrc: Oral  PainSc: 0-No pain         Complications: No apparent anesthesia complications

## 2018-03-14 NOTE — Discharge Instructions (Signed)
Electrical Cardioversion, Care After °This sheet gives you information about how to care for yourself after your procedure. Your health care provider may also give you more specific instructions. If you have problems or questions, contact your health care provider. °What can I expect after the procedure? °After the procedure, it is common to have: °· Some redness on the skin where the shocks were given. ° °Follow these instructions at home: °· Do not drive for 24 hours if you were given a medicine to help you relax (sedative). °· Take over-the-counter and prescription medicines only as told by your health care provider. °· Ask your health care provider how to check your pulse. Check it often. °· Rest for 48 hours after the procedure or as told by your health care provider. °· Avoid or limit your caffeine use as told by your health care provider. °Contact a health care provider if: °· You feel like your heart is beating too quickly or your pulse is not regular. °· You have a serious muscle cramp that does not go away. °Get help right away if: °· You have discomfort in your chest. °· You are dizzy or you feel faint. °· You have trouble breathing or you are short of breath. °· Your speech is slurred. °· You have trouble moving an arm or leg on one side of your body. °· Your fingers or toes turn cold or blue. °This information is not intended to replace advice given to you by your health care provider. Make sure you discuss any questions you have with your health care provider. °Document Released: 03/04/2013 Document Revised: 12/16/2015 Document Reviewed: 11/18/2015 °Elsevier Interactive Patient Education © 2018 [employer] ° °

## 2018-03-17 ENCOUNTER — Encounter (HOSPITAL_COMMUNITY): Payer: Self-pay | Admitting: Internal Medicine

## 2018-03-17 NOTE — Anesthesia Postprocedure Evaluation (Signed)
Anesthesia Post Note  Patient: [name]  Procedure(s) Performed: CARDIOVERSION (N/A )     Patient location during evaluation: Endoscopy Anesthesia Type: General Level of consciousness: awake and alert Pain management: pain level controlled Vital Signs Assessment: post-procedure vital signs reviewed and stable Respiratory status: spontaneous breathing, nonlabored ventilation, respiratory function stable and patient connected to nasal cannula oxygen Cardiovascular status: blood pressure returned to baseline and stable Postop Assessment: no apparent nausea or vomiting Anesthetic complications: no    Last Vitals:  Vitals:   03/14/18 1200 03/14/18 1210  BP: (!) 110/55 (!) 114/50  Pulse: (!) 58 (!) 59  Resp: 15 13  Temp:    SpO2: 99% 100%    Last Pain:  Vitals:   03/14/18 1210  TempSrc:   PainSc: 0-No pain                 Barnet Glasgow

## 2018-03-21 ENCOUNTER — Encounter (HOSPITAL_COMMUNITY): Payer: Self-pay | Admitting: Nurse Practitioner

## 2018-03-21 ENCOUNTER — Ambulatory Visit (HOSPITAL_COMMUNITY)
Admission: RE | Admit: 2018-03-21 | Discharge: 2018-03-21 | Disposition: A | Discharge status: Home / Self Care | Payer: Medicare Other | Source: Ambulatory Visit | Attending: Nurse Practitioner | Admitting: Nurse Practitioner

## 2018-03-21 VITALS — BP 116/78 | HR 66 | Ht 62.25 in | Wt 161.0 lb

## 2018-03-21 DIAGNOSIS — Z7901 Long term (current) use of anticoagulants: Secondary | ICD-10-CM | POA: Diagnosis not present

## 2018-03-21 DIAGNOSIS — I272 Pulmonary hypertension, unspecified: Secondary | ICD-10-CM | POA: Diagnosis not present

## 2018-03-21 DIAGNOSIS — F329 Major depressive disorder, single episode, unspecified: Secondary | ICD-10-CM | POA: Diagnosis not present

## 2018-03-21 DIAGNOSIS — M810 Age-related osteoporosis without current pathological fracture: Secondary | ICD-10-CM | POA: Insufficient documentation

## 2018-03-21 DIAGNOSIS — I110 Hypertensive heart disease with heart failure: Secondary | ICD-10-CM | POA: Diagnosis not present

## 2018-03-21 DIAGNOSIS — E669 Obesity, unspecified: Secondary | ICD-10-CM | POA: Diagnosis not present

## 2018-03-21 DIAGNOSIS — K219 Gastro-esophageal reflux disease without esophagitis: Secondary | ICD-10-CM | POA: Insufficient documentation

## 2018-03-21 DIAGNOSIS — Z7951 Long term (current) use of inhaled steroids: Secondary | ICD-10-CM | POA: Insufficient documentation

## 2018-03-21 DIAGNOSIS — J45909 Unspecified asthma, uncomplicated: Secondary | ICD-10-CM | POA: Insufficient documentation

## 2018-03-21 DIAGNOSIS — I5032 Chronic diastolic (congestive) heart failure: Secondary | ICD-10-CM | POA: Insufficient documentation

## 2018-03-21 DIAGNOSIS — Z79899 Other long term (current) drug therapy: Secondary | ICD-10-CM | POA: Insufficient documentation

## 2018-03-21 DIAGNOSIS — Z8249 Family history of ischemic heart disease and other diseases of the circulatory system: Secondary | ICD-10-CM | POA: Insufficient documentation

## 2018-03-21 DIAGNOSIS — I480 Paroxysmal atrial fibrillation: Secondary | ICD-10-CM | POA: Diagnosis not present

## 2018-03-21 NOTE — Progress Notes (Signed)
Primary Care Physician: Josetta Huddle, MD Referring Physician:Dr. Haroldine Laws   [name] is a 67 y.o. female with a h/o  HTN, pyruvate kinase deficiency with hemolytic anemia, hemochromatosis, obesity, asthma, OSA (on CPAP), restrictive lung disease/asthma/pul HTN, on O2 vial [place],  mitral valve prolapse s/p mini MV repair/Maze (2011 with Dr. [name]), PAF s/p RFA prior to Maze and diastolic dysfunction.  Pre-op cath with normal coronaries in 2011. 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 Has had cardioversion x 2, 10/2016 and most recently 10/14, for which she is in the afib clinic for f/u. The cardioversion was successful and she continues in SR today. She feels improved but at baseline  does not feel great.  Today, she denies symptoms of palpitations, chest pain, shortness of breath, orthopnea, PND, lower extremity edema, dizziness, presyncope, syncope, or neurologic sequela. The patient is tolerating medications without difficulties and is otherwise without complaint today.   Past Medical History:  Diagnosis Date  . Allergic rhinitis   . Anemia   . Asthma   . Asthma   . Atrial fibrillation ([place])    s/p RFA and Maze procedure - resolved  . Atrial tachycardia ([place])   . Complication of anesthesia    trouble waking up  . Depression   . Dyspnea   . Exercise hypoxemia    supplemental O2 PRN  . Fatigue   . Gastritis    NSAID induced  . GERD (gastroesophageal reflux disease)   . Heart murmur    severe MR s/p MV repair  . Hx of asplenia    surgical  . Hypertension   . 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 Myoclonus   . Osteoporosis   . Pulmonary HTN ([place])    mild to moderate by [place] 05/2012  . Pyruvate kinase deficiency hemolytic anemia ([place])   . Syncope and collapse 2012   r/t anemia  . Trochanteric bursitis    left hip   Past Surgical History:  Procedure Laterality Date  . ABLATION     afib  .  CARDIAC CATHETERIZATION N/A 10/29/2014   Procedure: Right Heart Cath;  Surgeon: Larey Dresser, MD;  Location: [place] [facility] LAB;  Service: Cardiovascular;  Laterality: N/A;  . CARDIOVERSION N/A 09/11/2013   Procedure: CARDIOVERSION;  Surgeon: Jolaine Artist, MD;  Location: [facility] ENDOSCOPY;  Service: Cardiovascular;  Laterality: N/A;  . CARDIOVERSION N/A 11/08/2016   Procedure: CARDIOVERSION;  Surgeon: Jolaine Artist, MD;  Location: [facility] ENDOSCOPY;  Service: Cardiovascular;  Laterality: N/A;  . CARDIOVERSION N/A 03/14/2018   Procedure: CARDIOVERSION;  Surgeon: Jolaine Artist, MD;  Location: [facility] ENDOSCOPY;  Service: Cardiovascular;  Laterality: N/A;  . CHOLECYSTECTOMY    . MAZE     with MV repair  . MITRAL VALVE REPAIR  01/2010   with MAZE  . myocardial window  2009   s/p afib ablation  . RIGHT HEART CATH N/A 11/22/2016   Procedure: Right Heart Cath;  Surgeon: Jolaine Artist, MD;  Location: [place] [facility] LAB;  Service: Cardiovascular;  Laterality: N/A;  . SPLENECTOMY    . TEE WITHOUT CARDIOVERSION N/A 09/11/2013   Procedure: TRANSESOPHAGEAL ECHOCARDIOGRAM (TEE);  Surgeon: Jolaine Artist, MD;  Location: [facility] ENDOSCOPY;  Service: Cardiovascular;  Laterality: N/A;    Current Outpatient Medications  Medication Sig Dispense Refill  . acetaminophen (TYLENOL 8 HOUR) 650 MG CR tablet Take 1 tablet (650 mg total) by mouth  every 8 (eight) hours as needed. (Patient taking differently: Take 650 mg by mouth every 8 (eight) hours as needed for pain. ) 30 tablet 0  . albuterol (PROVENTIL HFA;VENTOLIN HFA) 108 (90 Base) MCG/ACT inhaler Inhale 2 puffs into the lungs every 6 (six) hours as needed for wheezing or shortness of breath. 1 Inhaler 2  . ALPRAZolam (XANAX) 0.5 MG tablet Take 0.5 mg by mouth at bedtime.    Marland Kitchen augmented betamethasone dipropionate (DIPROLENE-AF) 0.05 % ointment Apply 1 application topically daily as needed for rash.    Marland Kitchen BREO ELLIPTA 100-25 MCG/INH AEPB INHALE 1 PUFF BY  MOUTH EVERY DAY (Patient taking differently: Inhale 1 puff into the lungs daily. ) 60 each 5  . calcium-vitamin D (OSCAL WITH D) 500-200 MG-UNIT tablet Take 1 tablet by mouth 2 (two) times daily.    . chlorpheniramine (CHLOR-TRIMETON) 4 MG tablet Take 4 mg by mouth at bedtime.    . cholecalciferol (VITAMIN D) 1000 UNITS tablet Take 1,000 Units by mouth 2 (two) times daily.     . clonazePAM (KLONOPIN) 1 MG tablet Take 2 mg by mouth at bedtime.     Marland Kitchen deferasirox (EXJADE) 500 MG disintegrating tablet Take 3 tablets (1,500 mg total) by mouth daily before breakfast. Faxed to [employer] [facility] [phone] (Patient taking differently: Take 1,500 mg by mouth daily before supper. Faxed to [employer] [facility] [phone]) 90 tablet 3  . diclofenac sodium (VOLTAREN) 1 % GEL Apply 2 g topically 4 (four) times daily as needed. (Patient taking differently: Apply 2 g topically 4 (four) times daily as needed (for pain). ) 100 g 0  . diltiazem (CARDIZEM CD) 240 MG 24 hr capsule Take 1 capsule (240 mg total) by mouth daily. 90 capsule 3  . ELIQUIS 5 MG TABS tablet TAKE 1 TABLET BY MOUTH TWICE A DAY 60 tablet 5  . EPINEPHrine (EPI-PEN) 0.3 mg/0.3 mL DEVI Inject 0.3 mg into the muscle once as needed (for allergic reaction).     Marland Kitchen escitalopram (LEXAPRO) 10 MG tablet Take 10 mg by mouth at bedtime.     . fexofenadine (ALLEGRA) 180 MG tablet Take 180 mg by mouth daily after breakfast.     . fluticasone (FLONASE) 50 MCG/ACT nasal spray Place 1 spray into both nostrils daily after breakfast.     . folic acid (FOLVITE) 734 MCG tablet Take 800 mcg by mouth daily after breakfast.     . KLOR-CON M20 20 MEQ tablet TAKE 3 TABLETS (60 MEQ TOTAL) 3 (THREE) TIMES DAILY BY MOUTH. (Patient taking differently: Take 60 mEq by mouth 3 (three) times daily. ) 810 tablet 0  . metolazone (ZAROXOLYN) 2.5 MG tablet Take 1 tablet (2.5 mg total) by mouth daily as needed (arthritis). (Patient taking  differently: Take 2.5 mg by mouth daily as needed (if weight gain of 3lbs over night or 5 lbs in a week). ) 30 tablet 1  . metoprolol succinate (TOPROL-XL) 50 MG 24 hr tablet Take 1 tablet (50 mg total) by mouth 2 (two) times daily. Take with or immediately following a meal. 180 tablet 1  . Multiple Vitamins-Minerals (AIRBORNE) TBEF Take 1 tablet by mouth 2 (two) times daily. *Dissolves in water twice a day*    . OXYGEN Inhale 3 L into the lungs continuous.     . pantoprazole (PROTONIX) 40 MG tablet Take 1 tablet (40 mg total) by mouth daily before breakfast. 30 tablet 5  . raloxifene (EVISTA) 60 MG tablet Take 60 mg  by mouth daily with breakfast.     . senna-docusate (SENOKOT-S) 8.6-50 MG tablet Take 1 tablet by mouth at bedtime as needed for mild constipation. 30 tablet 0  . sodium chloride (OCEAN) 0.65 % SOLN nasal spray Place 1 spray into both nostrils 2 (two) times daily.     Marland Kitchen spironolactone (ALDACTONE) 25 MG tablet TAKE 1 TABLET BY MOUTH EVERY DAY 90 tablet 0  . torsemide (DEMADEX) 20 MG tablet TAKE 2 TABLETS (40 MG TOTAL) BY MOUTH 2 (TWO) TIMES DAILY. 360 tablet 2  . traMADol (ULTRAM) 50 MG tablet Take 50 mg by mouth 4 (four) times daily.     No current facility-administered medications for this encounter.     Allergies  Allergen Reactions  . Iodine Anaphylaxis  . Shellfish Allergy Anaphylaxis  . Iohexol      Code: HIVES, Desc: reaction since childhood, Onset Date: 16109604   . Levaquin [Levofloxacin In D5w] Nausea And Vomiting    dizziness    Social History   Socioeconomic History  . Marital status: Married    Spouse name: Not on file  . Number of children: Not on file  . Years of education: Not on file  . Highest education level: Not on file  Occupational History  . Not on file  Social Needs  . Financial resource strain: Not on file  . Food insecurity:    Worry: Not on file    Inability: Not on file  . Transportation needs:    Medical: Not on file    Non-medical:  Not on file  Tobacco Use  . Smoking status: Never Smoker  . Smokeless tobacco: Never Used  Substance and Sexual Activity  . Alcohol use: Yes    Comment: socially glass red wine monthly  . Drug use: No  . Sexual activity: Yes    Birth control/protection: Post-menopausal  Lifestyle  . Physical activity:    Days per week: Not on file    Minutes per session: Not on file  . Stress: Not on file  Relationships  . Social connections:    Talks on phone: Not on file    Gets together: Not on file    Attends religious service: Not on file    Active member of club or organization: Not on file    Attends meetings of clubs or organizations: Not on file    Relationship status: Not on file  . Intimate partner violence:    Fear of current or ex partner: Not on file    Emotionally abused: Not on file    Physically abused: Not on file    Forced sexual activity: Not on file  Other Topics Concern  . Not on file  Social History Narrative  . Not on file    Family History  Problem Relation Age of Onset  . Breast cancer Mother 70       ER+  . Melanoma Father 24  . Adrenal disorder Sister        pheochromacytoma - rt. adrenal gland  . COPD Maternal Aunt   . Stomach cancer Paternal Uncle        onset in 48s  . Lung cancer Maternal Grandmother        died late 55s, heavy smoker  . Heart disease Maternal Grandfather        hardening of the arteries, poor circulation  . Ovarian cancer Sister 38       stage IV, now 82    ROS- All systems  are reviewed and negative except as per the HPI above  Physical Exam: Vitals:   03/21/18 1040  BP: 116/78  Pulse: 66  Weight: 73 kg  Height: 5' 2.25" (1.581 m)   Wt Readings from Last 3 Encounters:  03/21/18 73 kg  03/14/18 72.3 kg  03/10/18 72.3 kg    Labs: Lab Results  Component Value Date   NA 135 03/10/2018   K 3.8 03/10/2018   CL 93 (L) 03/10/2018   CO2 32 03/10/2018   GLUCOSE 111 (H) 03/10/2018   BUN 43 (H) 03/10/2018   CREATININE  1.68 (H) 03/10/2018   CALCIUM 9.6 03/10/2018   PHOS 3.5 07/13/2016   MG 2.4 03/10/2018   Lab Results  Component Value Date   INR 1.35 11/22/2016   Lab Results  Component Value Date   CHOL  03/02/2008    89        ATP III CLASSIFICATION:  <200     mg/dL   Desirable  200-239  mg/dL   Borderline High  >=240    mg/dL   High   HDL 32 (L) 03/02/2008   LDLCALC  03/02/2008    44        Total Cholesterol/HDL:CHD Risk Coronary Heart Disease Risk Table                     Men   Women  1/2 Average Risk   3.4   3.3   TRIG 66 03/02/2008     GEN- The patient is well appearing, alert and oriented x 3 today.   Head- normocephalic, atraumatic Eyes-  Sclera clear, conjunctiva pink Ears- hearing intact Oropharynx- clear Neck- supple, no JVP Lymph- no cervical lymphadenopathy Lungs- Clear to ausculation bilaterally, normal work of breathing Heart- Regular rate and rhythm, no murmurs, rubs or gallops, PMI not laterally displaced GI- soft, NT, ND, + BS Extremities- no clubbing, cyanosis, or edema MS- no significant deformity or atrophy Skin- no rash or lesion Psych- euthymic mood, full affect Neuro- strength and sensation are intact  EKG- NSR at 68 bpm, PR int 194 ms, qrs int 96 bpm, qtc 469 ms Epic records reviewed    Assessment and Plan: 1. Paroxysmal afib Recently cardioverted and is in SR today Continue Toprol Xl 50 mg and Diltiazem CD 240 mg daily  2. Chadsvasc score of at least 4 Continue eliquis 5 mg bid Reminded not to interrupt therapy for at least 4 weeks after cardioversion  3. Chronic diastolic HF States that she has only had to take metolazone 1 time since cardioversion Weight stable  F/u in afib clinic if  return of afib to further consider antiarrythmic's. Otherwise f/u with Dr. Haroldine Laws 12/10  Geroge Baseman. [place], [facility] [address] [place], [place] [address] [phone]

## 2018-03-31 ENCOUNTER — Other Ambulatory Visit: Payer: Self-pay | Admitting: Cardiology

## 2018-04-04 DIAGNOSIS — L814 Other melanin hyperpigmentation: Secondary | ICD-10-CM | POA: Diagnosis not present

## 2018-04-04 DIAGNOSIS — D225 Melanocytic nevi of trunk: Secondary | ICD-10-CM | POA: Diagnosis not present

## 2018-04-04 DIAGNOSIS — L570 Actinic keratosis: Secondary | ICD-10-CM | POA: Diagnosis not present

## 2018-04-04 DIAGNOSIS — L308 Other specified dermatitis: Secondary | ICD-10-CM | POA: Diagnosis not present

## 2018-04-04 DIAGNOSIS — D2272 Melanocytic nevi of left lower limb, including hip: Secondary | ICD-10-CM | POA: Diagnosis not present

## 2018-04-04 DIAGNOSIS — L821 Other seborrheic keratosis: Secondary | ICD-10-CM | POA: Diagnosis not present

## 2018-04-11 ENCOUNTER — Telehealth: Payer: Self-pay | Admitting: Pharmacist

## 2018-04-11 ENCOUNTER — Encounter (HOSPITAL_COMMUNITY): Payer: Self-pay | Admitting: Nurse Practitioner

## 2018-04-11 ENCOUNTER — Ambulatory Visit (HOSPITAL_COMMUNITY)
Admission: RE | Admit: 2018-04-11 | Discharge: 2018-04-11 | Disposition: A | Discharge status: Home / Self Care | Payer: Medicare Other | Source: Ambulatory Visit | Attending: Nurse Practitioner | Admitting: Nurse Practitioner

## 2018-04-11 VITALS — BP 118/68 | HR 69 | Ht 62.25 in | Wt 167.0 lb

## 2018-04-11 DIAGNOSIS — Z79899 Other long term (current) drug therapy: Secondary | ICD-10-CM | POA: Diagnosis not present

## 2018-04-11 DIAGNOSIS — Z7901 Long term (current) use of anticoagulants: Secondary | ICD-10-CM | POA: Diagnosis not present

## 2018-04-11 DIAGNOSIS — E669 Obesity, unspecified: Secondary | ICD-10-CM | POA: Insufficient documentation

## 2018-04-11 DIAGNOSIS — R1011 Right upper quadrant pain: Secondary | ICD-10-CM | POA: Diagnosis not present

## 2018-04-11 DIAGNOSIS — F329 Major depressive disorder, single episode, unspecified: Secondary | ICD-10-CM | POA: Insufficient documentation

## 2018-04-11 DIAGNOSIS — R011 Cardiac murmur, unspecified: Secondary | ICD-10-CM | POA: Insufficient documentation

## 2018-04-11 DIAGNOSIS — Z7951 Long term (current) use of inhaled steroids: Secondary | ICD-10-CM | POA: Diagnosis not present

## 2018-04-11 DIAGNOSIS — G4733 Obstructive sleep apnea (adult) (pediatric): Secondary | ICD-10-CM | POA: Insufficient documentation

## 2018-04-11 DIAGNOSIS — J45909 Unspecified asthma, uncomplicated: Secondary | ICD-10-CM | POA: Diagnosis not present

## 2018-04-11 DIAGNOSIS — I272 Pulmonary hypertension, unspecified: Secondary | ICD-10-CM | POA: Diagnosis not present

## 2018-04-11 DIAGNOSIS — I4819 Other persistent atrial fibrillation: Secondary | ICD-10-CM

## 2018-04-11 DIAGNOSIS — Z8249 Family history of ischemic heart disease and other diseases of the circulatory system: Secondary | ICD-10-CM | POA: Diagnosis not present

## 2018-04-11 DIAGNOSIS — I480 Paroxysmal atrial fibrillation: Secondary | ICD-10-CM | POA: Insufficient documentation

## 2018-04-11 DIAGNOSIS — M810 Age-related osteoporosis without current pathological fracture: Secondary | ICD-10-CM | POA: Diagnosis not present

## 2018-04-11 DIAGNOSIS — K219 Gastro-esophageal reflux disease without esophagitis: Secondary | ICD-10-CM | POA: Diagnosis not present

## 2018-04-11 DIAGNOSIS — I110 Hypertensive heart disease with heart failure: Secondary | ICD-10-CM | POA: Insufficient documentation

## 2018-04-11 DIAGNOSIS — I5032 Chronic diastolic (congestive) heart failure: Secondary | ICD-10-CM | POA: Insufficient documentation

## 2018-04-11 LAB — BASIC METABOLIC PANEL
ANION GAP: 8 (ref 5–15)
BUN: 37 mg/dL — ABNORMAL HIGH (ref 8–23)
CO2: 28 mmol/L (ref 22–32)
Calcium: 9.2 mg/dL (ref 8.9–10.3)
Chloride: 98 mmol/L (ref 98–111)
Creatinine, Ser: 1.37 mg/dL — ABNORMAL HIGH (ref 0.44–1.00)
GFR calc Af Amer: 44 mL/min — ABNORMAL LOW (ref >60)
GFR, EST NON AFRICAN AMERICAN: 38 mL/min — AB (ref >60)
GLUCOSE: 96 mg/dL (ref 70–99)
Potassium: 4.2 mmol/L (ref 3.5–5.1)
Sodium: 134 mmol/L — ABNORMAL LOW (ref 135–145)

## 2018-04-11 LAB — MAGNESIUM: MAGNESIUM: 2.5 mg/dL — AB (ref 1.7–2.4)

## 2018-04-11 NOTE — Progress Notes (Addendum)
Primary Care Physician: Josetta Huddle, MD Referring Physician:Dr. Haroldine Laws   [name] is a 67 y.o. female with a h/o  HTN, pyruvate kinase deficiency with hemolytic anemia, hemochromatosis, obesity, asthma, OSA (on CPAP), restrictive lung disease/asthma/pul HTN, on O2 vial [place],  mitral valve prolapse s/p mini MV repair/Maze (2011 with Dr. [name]), PAF s/p RFA prior to Maze and diastolic dysfunction.  Pre-op cath with normal coronaries in 2011. 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 Has had cardioversion x 2, 10/2016 and most recently 10/14, for which she is in the afib clinic for f/u. The cardioversion was successful and she continues in SR today. She feels improved but at baseline  does not feel great.  F/u in afib clinic, 11/15, as pt has gone back into afib. She has more fatigue and shortness of breath in afib and difficulty with fluid management.  Today, she denies symptoms of palpitations, chest pain, shortness of breath, orthopnea, PND, lower extremity edema, dizziness, presyncope, syncope, or neurologic sequela. The patient is tolerating medications without difficulties and is otherwise without complaint today.   Past Medical History:  Diagnosis Date  . Allergic rhinitis   . Anemia   . Asthma   . Asthma   . Atrial fibrillation ([place])    s/p RFA and Maze procedure - resolved  . Atrial tachycardia ([place])   . Complication of anesthesia    trouble waking up  . Depression   . Dyspnea   . Exercise hypoxemia    supplemental O2 PRN  . Fatigue   . Gastritis    NSAID induced  . GERD (gastroesophageal reflux disease)   . Heart murmur    severe MR s/p MV repair  . Hx of asplenia    surgical  . Hypertension   . 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 Myoclonus   . Osteoporosis   . Pulmonary HTN ([place])    mild to moderate by [place] 05/2012  . Pyruvate kinase deficiency hemolytic anemia ([place])   . Syncope and  collapse 2012   r/t anemia  . Trochanteric bursitis    left hip   Past Surgical History:  Procedure Laterality Date  . ABLATION     afib  . CARDIAC CATHETERIZATION N/A 10/29/2014   Procedure: Right Heart Cath;  Surgeon: Larey Dresser, MD;  Location: [place] [facility];  Service: Cardiovascular;  Laterality: N/A;  . CARDIOVERSION N/A 09/11/2013   Procedure: CARDIOVERSION;  Surgeon: Jolaine Artist, MD;  Location: [facility] ENDOSCOPY;  Service: Cardiovascular;  Laterality: N/A;  . CARDIOVERSION N/A 11/08/2016   Procedure: CARDIOVERSION;  Surgeon: Jolaine Artist, MD;  Location: [facility] ENDOSCOPY;  Service: Cardiovascular;  Laterality: N/A;  . CARDIOVERSION N/A 03/14/2018   Procedure: CARDIOVERSION;  Surgeon: Jolaine Artist, MD;  Location: [facility] ENDOSCOPY;  Service: Cardiovascular;  Laterality: N/A;  . CHOLECYSTECTOMY    . MAZE     with MV repair  . MITRAL VALVE REPAIR  01/2010   with MAZE  . myocardial window  2009   s/p afib ablation  . RIGHT HEART CATH N/A 11/22/2016   Procedure: Right Heart Cath;  Surgeon: Jolaine Artist, MD;  Location: [place] [facility];  Service: Cardiovascular;  Laterality: N/A;  . SPLENECTOMY    . TEE WITHOUT CARDIOVERSION N/A 09/11/2013   Procedure: TRANSESOPHAGEAL ECHOCARDIOGRAM (TEE);  Surgeon: Jolaine Artist, MD;  Location: [place];  Service: Cardiovascular;  Laterality: N/A;  Current Outpatient Medications  Medication Sig Dispense Refill  . acetaminophen (TYLENOL 8 HOUR) 650 MG CR tablet Take 1 tablet (650 mg total) by mouth every 8 (eight) hours as needed. (Patient taking differently: Take 650 mg by mouth every 8 (eight) hours as needed for pain. ) 30 tablet 0  . albuterol (PROVENTIL HFA;VENTOLIN HFA) 108 (90 Base) MCG/ACT inhaler Inhale 2 puffs into the lungs every 6 (six) hours as needed for wheezing or shortness of breath. 1 Inhaler 2  . ALPRAZolam (XANAX) 0.5 MG tablet Take 0.5 mg by mouth at bedtime.    Marland Kitchen augmented betamethasone  dipropionate (DIPROLENE-AF) 0.05 % ointment Apply 1 application topically daily as needed for rash.    Marland Kitchen BREO ELLIPTA 100-25 MCG/INH AEPB INHALE 1 PUFF BY MOUTH EVERY DAY (Patient taking differently: Inhale 1 puff into the lungs daily. ) 60 each 5  . calcium-vitamin D (OSCAL WITH D) 500-200 MG-UNIT tablet Take 1 tablet by mouth 2 (two) times daily.    . chlorpheniramine (CHLOR-TRIMETON) 4 MG tablet Take 4 mg by mouth at bedtime.    . cholecalciferol (VITAMIN D) 1000 UNITS tablet Take 1,000 Units by mouth 2 (two) times daily.     . clonazePAM (KLONOPIN) 1 MG tablet Take 2 mg by mouth at bedtime.     Marland Kitchen deferasirox (EXJADE) 500 MG disintegrating tablet Take 3 tablets (1,500 mg total) by mouth daily before breakfast. Faxed to [employer] [facility] [phone] (Patient taking differently: Take 1,500 mg by mouth daily before supper. Faxed to [employer] [facility] [phone]) 90 tablet 3  . diclofenac sodium (VOLTAREN) 1 % GEL Apply 2 g topically 4 (four) times daily as needed. (Patient taking differently: Apply 2 g topically 4 (four) times daily as needed (for pain). ) 100 g 0  . diltiazem (CARDIZEM CD) 240 MG 24 hr capsule Take 1 capsule (240 mg total) by mouth daily. 90 capsule 3  . ELIQUIS 5 MG TABS tablet TAKE 1 TABLET BY MOUTH TWICE A DAY 60 tablet 5  . EPINEPHrine (EPI-PEN) 0.3 mg/0.3 mL DEVI Inject 0.3 mg into the muscle once as needed (for allergic reaction).     Marland Kitchen escitalopram (LEXAPRO) 10 MG tablet Take 10 mg by mouth at bedtime.     . fexofenadine (ALLEGRA) 180 MG tablet Take 180 mg by mouth daily after breakfast.     . fluticasone (FLONASE) 50 MCG/ACT nasal spray Place 1 spray into both nostrils daily after breakfast.     . folic acid (FOLVITE) 539 MCG tablet Take 800 mcg by mouth daily after breakfast.     . KLOR-CON M20 20 MEQ tablet TAKE 3 TABLETS (60 MEQ TOTAL) 3 (THREE) TIMES DAILY BY MOUTH. (Patient taking differently: Take 60 mEq by mouth 3 (three)  times daily. ) 810 tablet 0  . metolazone (ZAROXOLYN) 2.5 MG tablet Take 1 tablet (2.5 mg total) by mouth daily as needed (arthritis). (Patient taking differently: Take 2.5 mg by mouth daily as needed (if weight gain of 3lbs over night or 5 lbs in a week). ) 30 tablet 1  . metoprolol succinate (TOPROL-XL) 50 MG 24 hr tablet Take 1 tablet (50 mg total) by mouth 2 (two) times daily. Take with or immediately following a meal. 180 tablet 1  . Multiple Vitamins-Minerals (A[place]NE) TBEF Take 1 tablet by mouth 2 (two) times daily. *Dissolves in water twice a day*    . OXYGEN Inhale 3 L into the lungs continuous.     . pantoprazole (PROTONIX) 40  MG tablet Take 1 tablet (40 mg total) by mouth daily before breakfast. 30 tablet 5  . raloxifene (EVISTA) 60 MG tablet Take 60 mg by mouth daily with breakfast.     . senna-docusate (SENOKOT-S) 8.6-50 MG tablet Take 1 tablet by mouth at bedtime as needed for mild constipation. 30 tablet 0  . sodium chloride (OCEAN) 0.65 % SOLN nasal spray Place 1 spray into both nostrils 2 (two) times daily.     Marland Kitchen spironolactone (ALDACTONE) 25 MG tablet TAKE 1 TABLET BY MOUTH EVERY DAY 90 tablet 0  . torsemide (DEMADEX) 20 MG tablet TAKE 2 TABLETS (40 MG TOTAL) BY MOUTH 2 (TWO) TIMES DAILY. 360 tablet 2  . traMADol (ULTRAM) 50 MG tablet Take 50 mg by mouth 4 (four) times daily.     No current facility-administered medications for this encounter.     Allergies  Allergen Reactions  . Iodine Anaphylaxis  . Shellfish Allergy Anaphylaxis  . Iohexol      Code: HIVES, Desc: reaction since childhood, Onset Date: [id number]   . Levaquin [Levofloxacin In D5w] Nausea And Vomiting    dizziness    Social History   Socioeconomic History  . Marital status: Married    Spouse name: Not on file  . Number of children: Not on file  . Years of education: Not on file  . Highest education level: Not on file  Occupational History  . Not on file  Social Needs  . Financial resource strain:  Not on file  . Food insecurity:    Worry: Not on file    Inability: Not on file  . Transportation needs:    Medical: Not on file    Non-medical: Not on file  Tobacco Use  . Smoking status: Never Smoker  . Smokeless tobacco: Never Used  Substance and Sexual Activity  . Alcohol use: Yes    Comment: socially glass red wine monthly  . Drug use: No  . Sexual activity: Yes    Birth control/protection: Post-menopausal  Lifestyle  . Physical activity:    Days per week: Not on file    Minutes per session: Not on file  . Stress: Not on file  Relationships  . Social connections:    Talks on phone: Not on file    Gets together: Not on file    Attends religious service: Not on file    Active member of club or organization: Not on file    Attends meetings of clubs or organizations: Not on file    Relationship status: Not on file  . Intimate partner violence:    Fear of current or ex partner: Not on file    Emotionally abused: Not on file    Physically abused: Not on file    Forced sexual activity: Not on file  Other Topics Concern  . Not on file  Social History Narrative  . Not on file    Family History  Problem Relation Age of Onset  . Breast cancer Mother 85       ER+  . Melanoma Father 22  . Adrenal disorder Sister        pheochromacytoma - rt. adrenal gland  . COPD Maternal Aunt   . Stomach cancer Paternal Uncle        onset in 71s  . Lung cancer Maternal Grandmother        died late 32s, heavy smoker  . Heart disease Maternal Grandfather        hardening of  the arteries, poor circulation  . Ovarian cancer Sister 45       stage IV, now 27    ROS- All systems are reviewed and negative except as per the HPI above  Physical Exam: Vitals:   04/11/18 1103  BP: 118/68  Pulse: 69  Weight: 75.8 kg  Height: 5' 2.25" (1.581 m)   Wt Readings from Last 3 Encounters:  04/11/18 75.8 kg  03/21/18 73 kg  03/14/18 72.3 kg    Labs: Lab Results  Component Value Date    NA 134 (L) 04/11/2018   K 4.2 04/11/2018   CL 98 04/11/2018   CO2 28 04/11/2018   GLUCOSE 96 04/11/2018   BUN 37 (H) 04/11/2018   CREATININE 1.37 (H) 04/11/2018   CALCIUM 9.2 04/11/2018   PHOS 3.5 07/13/2016   MG 2.5 (H) 04/11/2018   Lab Results  Component Value Date   INR 1.35 11/22/2016   Lab Results  Component Value Date   CHOL  03/02/2008    89        ATP III CLASSIFICATION:  <200     mg/dL   Desirable  200-239  mg/dL   Borderline High  >=240    mg/dL   High   HDL 32 (L) 03/02/2008   LDLCALC  03/02/2008    44        Total Cholesterol/HDL:CHD Risk Coronary Heart Disease Risk Table                     Men   Women  1/2 Average Risk   3.4   3.3   TRIG 66 03/02/2008     GEN- The patient is well appearing, alert and oriented x 3 today.   Head- normocephalic, atraumatic Eyes-  Sclera clear, conjunctiva pink Ears- hearing intact Oropharynx- clear Neck- supple, no JVP Lymph- no cervical lymphadenopathy Lungs- Clear to ausculation bilaterally, normal work of breathing Heart- irregular rate and rhythm, no murmurs, rubs or gallops, PMI not laterally displaced GI- soft, NT, ND, + BS Extremities- no clubbing, cyanosis, or edema MS- no significant deformity or atrophy Skin- no rash or lesion Psych- euthymic mood, full affect Neuro- strength and sensation are intact  EKG- afib at 60 bpm, qrs 90 ms, qtc 447 ms Epic records reviewed    Assessment and Plan: 1. Paroxysmal afib Recently with successful cardioversion  but with ERAF Discussion re antiarrythmic's today to restore SR I do not believe her to be a good ablation candidate as she has a severely dilated left atrium With current lung issues, I would like to avoid amiodarone Discussed Tikosyn, I have concerns with her high dose diuretics, if she could keep her mag/Kt levels sufficient to take ikosyn safely. Drugs to be screened by PharmD She will call and check on the price of the drug Bmet/mag today Continue  Toprol Xl 50 mg and Diltiazem CD 240 mg daily, she is rate controlled States that she does not use benadryl qtc acceptable at 447 ms today  2. Chadsvasc score of at least 4 Continue eliquis 5 mg bid States no missed  doses for at least 3 weeks  3. Chronic diastolic HF Continue current  diuretic drugs Daily weights  After hearing from PharmD and pt re cost of drug will proceed with tikosyn  Addendum: 11/29- PharmD did not want pt on metolazone with addition of Tikosyn. I spoke to Dr. Mahalia Longest and he was not sure if pt could do without metolazone as she usually requires  once a week, already on torsemide 40 mg bid. I have placed a message to Dr. Lamonte Sakai to see his thoughts if pt could take amiodarone with baseline lung issues, have not heard back yet. Dr. Haroldine Laws suggested possibly amiodarone vrs ablation. Her left atrium is severely dilated. I have obtained an appointment with Dr. Curt Bears to see if he thinks ablation would be an option.  Geroge Baseman Quanesha Klimaszewski, [facility] [address] [place], [place] [address] [phone]

## 2018-04-11 NOTE — Telephone Encounter (Signed)
Medication list reviewed in anticipation of upcoming Tikosyn initiation. She will need to stop her metolazone as thiazide-like diuretics are contraindicated with Tikosyn. She also takes escitalopram which is QTc prolonging - will need to monitor QTc very closely. Recommend switching chlorpheniramine to 2nd generation antihistamine (she does have Allegra on her med list already - would recommend using this instead).   Patient is anticoagulated on Eliquis 5mg  BID on the appropriate dose. Please ensure that patient has not missed any anticoagulation doses in the 3 weeks prior to Tikosyn initiation.   Patient will need to be counseled to avoid use of Benadryl while on Tikosyn and in the 2-3 days prior to Tikosyn initiation.

## 2018-04-23 NOTE — Addendum Note (Signed)
Encounter addended by: Sherran Needs, NP on: 04/23/2018 1:11 PM  Actions taken: Sign clinical note

## 2018-04-28 DIAGNOSIS — M859 Disorder of bone density and structure, unspecified: Secondary | ICD-10-CM | POA: Diagnosis not present

## 2018-04-28 DIAGNOSIS — F418 Other specified anxiety disorders: Secondary | ICD-10-CM | POA: Diagnosis not present

## 2018-04-28 DIAGNOSIS — N182 Chronic kidney disease, stage 2 (mild): Secondary | ICD-10-CM | POA: Diagnosis not present

## 2018-04-28 DIAGNOSIS — Z79899 Other long term (current) drug therapy: Secondary | ICD-10-CM | POA: Diagnosis not present

## 2018-04-28 DIAGNOSIS — G4761 Periodic limb movement disorder: Secondary | ICD-10-CM | POA: Diagnosis not present

## 2018-04-28 DIAGNOSIS — G4733 Obstructive sleep apnea (adult) (pediatric): Secondary | ICD-10-CM | POA: Diagnosis not present

## 2018-04-28 DIAGNOSIS — K219 Gastro-esophageal reflux disease without esophagitis: Secondary | ICD-10-CM | POA: Diagnosis not present

## 2018-04-28 DIAGNOSIS — J45909 Unspecified asthma, uncomplicated: Secondary | ICD-10-CM | POA: Diagnosis not present

## 2018-04-28 DIAGNOSIS — I119 Hypertensive heart disease without heart failure: Secondary | ICD-10-CM | POA: Diagnosis not present

## 2018-04-28 DIAGNOSIS — M810 Age-related osteoporosis without current pathological fracture: Secondary | ICD-10-CM | POA: Diagnosis not present

## 2018-04-28 DIAGNOSIS — Z23 Encounter for immunization: Secondary | ICD-10-CM | POA: Diagnosis not present

## 2018-04-28 DIAGNOSIS — I10 Essential (primary) hypertension: Secondary | ICD-10-CM | POA: Diagnosis not present

## 2018-04-28 NOTE — Progress Notes (Signed)
Electrophysiology Office Note   Date:  04/29/2018   ID:  [name], DOB [birth year], MRN [id number]  PCP:  Josetta Huddle, MD  Cardiologist:  [facility] Primary Electrophysiologist:  Jeffrie Lofstrom Meredith Leeds, MD    No chief complaint on file.    History of Present Illness: [name] is a 67 y.o. female who is being seen today for the evaluation of atrial fibrillation at the request of Doristine Devoid. Presenting today for electrophysiology evaluation.  She has a history of hypertension, pyruvate kinase deficiency with hemolytic anemia, hemochromatosis, obesity, asthma, OSA on CPAP, restrictive lung disease, asthma, pulmonary hypertension on home oxygen, mini mitral valve repair with maze in 2011.  She is referred from [facility] fib clinic.  She had a cardioversion 10/14 and remained in sinus rhythm.  Unfortunately she quickly went back in atrial fibrillation on 1115.  She complains of fatigue and shortness of breath.  He does state that her heart rate goes fast and when it does she feels weak and fatigued.  When her heart rate is in the 60s to 80s, her weakness and fatigue is much improved.   Today, she denies symptoms of palpitations, chest pain, PND, lower extremity edema, claudication, dizziness, presyncope, syncope, bleeding, or neurologic sequela. The patient is tolerating medications without difficulties.    Past Medical History:  Diagnosis Date  . Allergic rhinitis   . Anemia   . Asthma   . Asthma   . Atrial fibrillation ([place])    s/p RFA and Maze procedure - resolved  . Atrial tachycardia ([place])   . Complication of anesthesia    trouble waking up  . Depression   . Dyspnea   . Exercise hypoxemia    supplemental O2 PRN  . Fatigue   . Gastritis    NSAID induced  . GERD (gastroesophageal reflux disease)   . Heart murmur    severe MR s/p MV repair  . Hx of asplenia    surgical  . Hypertension   . Mitral regurgitation    s/p MV repair complicated by diaphragmatic  paralysis  . Myoclonus   . Osteoporosis   . Pulmonary HTN ([place])    mild to moderate by [place] 05/2012  . Pyruvate kinase deficiency hemolytic anemia ([place])   . Syncope and collapse 2012   r/t anemia  . Trochanteric bursitis    left hip   Past Surgical History:  Procedure Laterality Date  . ABLATION     afib  . CARDIAC CATHETERIZATION N/A 10/29/2014   Procedure: Right Heart Cath;  Surgeon: Larey Dresser, MD;  Location: [place] [facility];  Service: Cardiovascular;  Laterality: N/A;  . CARDIOVERSION N/A 09/11/2013   Procedure: CARDIOVERSION;  Surgeon: Jolaine Artist, MD;  Location: [facility] ENDOSCOPY;  Service: Cardiovascular;  Laterality: N/A;  . CARDIOVERSION N/A 11/08/2016   Procedure: CARDIOVERSION;  Surgeon: Jolaine Artist, MD;  Location: [facility] ENDOSCOPY;  Service: Cardiovascular;  Laterality: N/A;  . CARDIOVERSION N/A 03/14/2018   Procedure: CARDIOVERSION;  Surgeon: Jolaine Artist, MD;  Location: [facility] ENDOSCOPY;  Service: Cardiovascular;  Laterality: N/A;  . CHOLECYSTECTOMY    . MAZE     with MV repair  . MITRAL VALVE REPAIR  01/2010   with MAZE  . myocardial window  2009   s/p afib ablation  . RIGHT HEART CATH N/A 11/22/2016   Procedure: Right Heart Cath;  Surgeon: Jolaine Artist, MD;  Location: [place] [facility];  Service: Cardiovascular;  Laterality: N/A;  .  SPLENECTOMY    . TEE WITHOUT CARDIOVERSION N/A 09/11/2013   Procedure: TRANSESOPHAGEAL ECHOCARDIOGRAM (TEE);  Surgeon: Jolaine Artist, MD;  Location: [facility] ENDOSCOPY;  Service: Cardiovascular;  Laterality: N/A;     Current Outpatient Medications  Medication Sig Dispense Refill  . acetaminophen (TYLENOL 8 HOUR) 650 MG CR tablet Take 1 tablet (650 mg total) by mouth every 8 (eight) hours as needed. (Patient taking differently: Take 650 mg by mouth every 8 (eight) hours as needed for pain. ) 30 tablet 0  . albuterol (PROVENTIL HFA;VENTOLIN HFA) 108 (90 Base) MCG/ACT inhaler Inhale 2 puffs into the lungs every 6 (six)  hours as needed for wheezing or shortness of breath. 1 Inhaler 2  . ALPRAZolam (XANAX) 0.5 MG tablet Take 0.5 mg by mouth at bedtime.    Marland Kitchen augmented betamethasone dipropionate (DIPROLENE-AF) 0.05 % ointment Apply 1 application topically daily as needed for rash.    Marland Kitchen BREO ELLIPTA 100-25 MCG/INH AEPB INHALE 1 PUFF BY MOUTH EVERY DAY (Patient taking differently: Inhale 1 puff into the lungs daily. ) 60 each 5  . calcium-vitamin D (OSCAL WITH D) 500-200 MG-UNIT tablet Take 1 tablet by mouth 2 (two) times daily.    . chlorpheniramine (CHLOR-TRIMETON) 4 MG tablet Take 4 mg by mouth at bedtime.    . cholecalciferol (VITAMIN D) 1000 UNITS tablet Take 1,000 Units by mouth 2 (two) times daily.     . clonazePAM (KLONOPIN) 1 MG tablet Take 2 mg by mouth at bedtime.     Marland Kitchen deferasirox (EXJADE) 500 MG disintegrating tablet Take 3 tablets (1,500 mg total) by mouth daily before breakfast. Faxed to [employer] [facility] [phone] (Patient taking differently: Take 1,500 mg by mouth daily before supper. Faxed to [employer] [phone]) 90 tablet 3  . diclofenac sodium (VOLTAREN) 1 % GEL Apply 2 g topically 4 (four) times daily as needed. (Patient taking differently: Apply 2 g topically 4 (four) times daily as needed (for pain). ) 100 g 0  . diltiazem (CARDIZEM CD) 240 MG 24 hr capsule Take 1 capsule (240 mg total) by mouth daily. 90 capsule 3  . ELIQUIS 5 MG TABS tablet TAKE 1 TABLET BY MOUTH TWICE A DAY 60 tablet 5  . EPINEPHrine (EPI-PEN) 0.3 mg/0.3 mL DEVI Inject 0.3 mg into the muscle once as needed (for allergic reaction).     Marland Kitchen escitalopram (LEXAPRO) 10 MG tablet Take 10 mg by mouth at bedtime.     . fexofenadine (ALLEGRA) 180 MG tablet Take 180 mg by mouth daily after breakfast.     . fluticasone (FLONASE) 50 MCG/ACT nasal spray Place 1 spray into both nostrils daily after breakfast.     . folic acid (FOLVITE) 742 MCG tablet Take 800 mcg by mouth daily after  breakfast.     . KLOR-CON M20 20 MEQ tablet TAKE 3 TABLETS (60 MEQ TOTAL) 3 (THREE) TIMES DAILY BY MOUTH. (Patient taking differently: Take 60 mEq by mouth 3 (three) times daily. ) 810 tablet 0  . metolazone (ZAROXOLYN) 2.5 MG tablet Take 1 tablet (2.5 mg total) by mouth daily as needed (arthritis). (Patient taking differently: Take 2.5 mg by mouth daily as needed (if weight gain of 3lbs over night or 5 lbs in a week). ) 30 tablet 1  . Multiple Vitamins-Minerals (AIRBORNE) TBEF Take 1 tablet by mouth 2 (two) times daily. *Dissolves in water twice a day*    . OXYGEN Inhale 3 L into the lungs continuous.     Marland Kitchen  pantoprazole (PROTONIX) 40 MG tablet Take 1 tablet (40 mg total) by mouth daily before breakfast. 30 tablet 5  . raloxifene (EVISTA) 60 MG tablet Take 60 mg by mouth daily with breakfast.     . senna-docusate (SENOKOT-S) 8.6-50 MG tablet Take 1 tablet by mouth at bedtime as needed for mild constipation. 30 tablet 0  . sodium chloride (OCEAN) 0.65 % SOLN nasal spray Place 1 spray into both nostrils 2 (two) times daily.     Marland Kitchen spironolactone (ALDACTONE) 25 MG tablet TAKE 1 TABLET BY MOUTH EVERY DAY 90 tablet 0  . torsemide (DEMADEX) 20 MG tablet TAKE 2 TABLETS (40 MG TOTAL) BY MOUTH 2 (TWO) TIMES DAILY. 360 tablet 2  . traMADol (ULTRAM) 50 MG tablet Take 50 mg by mouth 4 (four) times daily.    Marland Kitchen diltiazem (TIAZAC) 300 MG 24 hr capsule Take 1 capsule (300 mg total) by mouth daily. 30 capsule 3  . metoprolol succinate (TOPROL-XL) 50 MG 24 hr tablet Take 1 tablet (50 mg total) by mouth 2 (two) times daily. Take with or immediately following a meal. 180 tablet 1   No current facility-administered medications for this visit.     Allergies:   Iodine; Shellfish allergy; Iohexol; and Levaquin [levofloxacin in d5w]   Social History:  The patient  reports that she has never smoked. She has never used smokeless tobacco. She reports that she drinks alcohol. She reports that she does not use drugs.    Family History:  The patient's family history includes Adrenal disorder in her sister; Breast cancer (age of onset: 59) in her mother; COPD in her maternal aunt; Heart disease in her maternal grandfather; Lung cancer in her maternal grandmother; Melanoma (age of onset: 57) in her father; Ovarian cancer (age of onset: 15) in her sister; Stomach cancer in her paternal uncle.    ROS:  Please see the history of present illness.   Otherwise, review of systems is positive for none.   All other systems are reviewed and negative.    PHYSICAL EXAM: VS:  BP 106/64   Pulse 76   Ht 5\' 2"  (1.575 m)   Wt 163 lb (73.9 kg)   SpO2 93%   BMI 29.81 kg/m  , BMI Body mass index is 29.81 kg/m. GEN: Well nourished, well developed, in no acute distress  HEENT: normal  Neck: no JVD, carotid bruits, or masses Cardiac: iRRR; no murmurs, rubs, or gallops,no edema  Respiratory:  clear to auscultation bilaterally, normal work of breathing GI: soft, nontender, nondistended, + BS MS: no deformity or atrophy  Skin: warm and dry Neuro:  Strength and sensation are intact Psych: euthymic mood, full affect  EKG:  EKG is not ordered today. Personal review of the ekg ordered 04/11/18 shows atrial fibrillation, rate 69, right axis deviation  Recent Labs: 09/14/2017: ALT 14 02/24/2018: Hemoglobin 8.7; Platelet Count 510 04/11/2018: BUN 37; Creatinine, Ser 1.37; Magnesium 2.5; Potassium 4.2; Sodium 134    Lipid Panel     Component Value Date/Time   CHOL  03/02/2008 0510    89        ATP III CLASSIFICATION:  <200     mg/dL   Desirable  200-239  mg/dL   Borderline High  >=240    mg/dL   High   TRIG 66 03/02/2008 0510   HDL 32 (L) 03/02/2008 0510   CHOLHDL 2.8 03/02/2008 0510   VLDL 13 03/02/2008 0510   LDLCALC  03/02/2008 0510    44  Total Cholesterol/HDL:CHD Risk Coronary Heart Disease Risk Table                     Men   Women  1/2 Average Risk   3.4   3.3     Wt Readings from Last 3  Encounters:  04/29/18 163 lb (73.9 kg)  04/11/18 167 lb (75.8 kg)  03/21/18 161 lb (73 kg)      Other studies Reviewed: Additional studies/ records that were reviewed today include: TTE 04/04/16  Review of the above records today demonstrates:  - Left ventricle: The cavity size was normal. Systolic function was   normal. The estimated ejection fraction was in the range of 55%   to 60%. Wall motion was normal; there were no regional wall   motion abnormalities. The study is not technically sufficient to   allow evaluation of LV diastolic function. Doppler parameters are   consistent with high ventricular filling pressure. - Aortic valve: Transvalvular velocity was within the normal range.   There was no stenosis. There was no regurgitation. - Mitral valve: Prior procedures included surgical repair. The   findings are consistent with mild stenosis. Valve area by   pressure half-time: 1.58 cm^2. Valve area by continuity equation   (using LVOT flow): 1.03 cm^2. - Left atrium: The atrium was severely dilated. - Right atrium: The atrium was severely dilated. - Tricuspid valve: There was moderate-severe regurgitation. - Pulmonary arteries: Systolic pressure was severely increased. PA   peak pressure: 86 mm Hg (S).   ASSESSMENT AND PLAN:  1.  Persistent atrial fibrillation: Currently on Eliquis.  She does have more symptoms when her heart rate is fast and thus we Sreeja Spies plan to increase her diltiazem to 300 mg.  Her left atrium is unfortunately severely dilated, and she does have significant pulmonary hypertension and asthma.  I am concerned that she has both pulmonary and nonpulmonary vein triggers for her atrial fibrillation.  She does feel better in sinus rhythm, but she has limited medication options.  We Elhadji Pecore plan to start her on low-dose amiodarone.  We Shaquille Murdy discuss this with her primary cardiologist.  This patients CHA2DS2-VASc Score and unadjusted Ischemic Stroke Rate (% per year) is  equal to 4.8 % stroke rate/year from a score of 4  Above score calculated as 1 point each if present [CHF, HTN, DM, Vascular=MI/PAD/Aortic Plaque, Age if 65-74, or Female] Above score calculated as 2 points each if present [Age > 75, or Stroke/TIA/TE]  2.  Hypertension: Well-controlled.  No changes.  3.  Mitral valve prolapse: Status post mitral valve repair in 2011.  Stable per primary cardiology  4.  Obstructive sleep apnea: CPAP compliance encouraged  Current medicines are reviewed at length with the patient today.   The patient does not have concerns regarding her medicines.  The following changes were made today:  none  Labs/ tests ordered today include:  No orders of the defined types were placed in this encounter.    Disposition:   FU with Artice Holohan 2 months  Signed, Kaylinn Dedic Meredith Leeds, MD  04/29/2018 12:52 PM     [facility] [address] [place] [address] [phone] (office) [phone] (fax)

## 2018-04-29 ENCOUNTER — Encounter: Payer: Self-pay | Admitting: Cardiology

## 2018-04-29 ENCOUNTER — Ambulatory Visit (INDEPENDENT_AMBULATORY_CARE_PROVIDER_SITE_OTHER): Payer: Medicare Other | Admitting: Cardiology

## 2018-04-29 VITALS — BP 106/64 | HR 76 | Ht 62.0 in | Wt 163.0 lb

## 2018-04-29 DIAGNOSIS — I4819 Other persistent atrial fibrillation: Secondary | ICD-10-CM | POA: Diagnosis not present

## 2018-04-29 DIAGNOSIS — G4733 Obstructive sleep apnea (adult) (pediatric): Secondary | ICD-10-CM | POA: Diagnosis not present

## 2018-04-29 DIAGNOSIS — I10 Essential (primary) hypertension: Secondary | ICD-10-CM

## 2018-04-29 MED ORDER — DILTIAZEM HCL ER BEADS 300 MG PO CP24: 300.0000 mg | ORAL_CAPSULE | Freq: Every day | ORAL | 3 refills | Status: DC

## 2018-04-29 NOTE — Patient Instructions (Addendum)
Medication Instructions:  Your physician has recommended you make the following change in your medication:  1. INCREASE Diltiazem to 300 mg once daily  If you need a refill on your cardiac medications before your next appointment, please call your pharmacy.   Lab work: None ordered  Testing/Procedures: None ordered  Follow-Up: To be determined   Thank you for choosing [facility]!!   Trinidad Curet, RN [phone]  Any Other Special Instructions Will Be Listed Below (If Applicable).

## 2018-05-06 ENCOUNTER — Encounter (HOSPITAL_COMMUNITY): Payer: Medicare Other | Admitting: Internal Medicine

## 2018-05-07 ENCOUNTER — Other Ambulatory Visit (HOSPITAL_COMMUNITY): Payer: Self-pay | Admitting: Internal Medicine

## 2018-05-08 ENCOUNTER — Inpatient Hospital Stay: Payer: Medicare Other | Admitting: Oncology

## 2018-05-08 ENCOUNTER — Inpatient Hospital Stay: Payer: Medicare Other

## 2018-05-09 ENCOUNTER — Encounter: Payer: Self-pay | Admitting: *Deleted

## 2018-05-09 ENCOUNTER — Telehealth: Payer: Self-pay | Admitting: Oncology

## 2018-05-09 NOTE — Progress Notes (Signed)
Received answering service fax that patient called on 05/07/18 at 9:36 pm to cancel her appointment on 12/12 due to "stomach bug".  Message sent to scheduler to reschedule her.

## 2018-05-09 NOTE — Telephone Encounter (Signed)
R/s appt per 12/13 sch message - pt is aware of appt date and time

## 2018-05-13 ENCOUNTER — Other Ambulatory Visit (HOSPITAL_COMMUNITY): Payer: Self-pay | Admitting: Internal Medicine

## 2018-05-13 ENCOUNTER — Telehealth: Payer: Self-pay | Admitting: *Deleted

## 2018-05-13 NOTE — Telephone Encounter (Signed)
Informed pt Dr. Curt Bears and [place] spoke.  Advisement is to start Amiodarone. Pt is allergic to iodine, which Amio contains.  Pt aware I will re-discuss w/ [place] and let her know this week/next.

## 2018-05-15 ENCOUNTER — Other Ambulatory Visit (HOSPITAL_COMMUNITY): Payer: Self-pay | Admitting: Internal Medicine

## 2018-05-22 ENCOUNTER — Telehealth: Payer: Self-pay | Admitting: Emergency Medicine

## 2018-05-22 NOTE — Telephone Encounter (Signed)
Spoke with patient-she will be here tomorrow at 9:30am to see Lazaro Arms, NP

## 2018-05-22 NOTE — Telephone Encounter (Signed)
Yes. Since she has a fever with symptoms she needs to be seen. Thanks.

## 2018-05-22 NOTE — Telephone Encounter (Signed)
Call made to patient, patient states she has productive cough with yellow and green mucous, fever 101 , increased fatigue, and congestion. States she has been taking Mucinex for last 4 days and her symptoms has slowly gotten worse. Denies that her SOB is worse than her baseline. Denies chest pain or discomfort. Declined appointment. I did make patient aware RB is not in clinic and due to her fever she may be asked to come to office. She states her immune system is weak and she should n not be around other people.

## 2018-05-23 ENCOUNTER — Ambulatory Visit (INDEPENDENT_AMBULATORY_CARE_PROVIDER_SITE_OTHER): Payer: Medicare Other | Admitting: Nurse Practitioner

## 2018-05-23 ENCOUNTER — Encounter: Payer: Self-pay | Admitting: Nurse Practitioner

## 2018-05-23 VITALS — BP 128/66 | HR 81 | Temp 99.1°F | Ht 62.25 in | Wt 161.4 lb

## 2018-05-23 DIAGNOSIS — J40 Bronchitis, not specified as acute or chronic: Secondary | ICD-10-CM | POA: Diagnosis not present

## 2018-05-23 DIAGNOSIS — R509 Fever, unspecified: Secondary | ICD-10-CM

## 2018-05-23 LAB — POCT INFLUENZA A/B
Influenza A, POC: NEGATIVE
Influenza B, POC: NEGATIVE

## 2018-05-23 MED ORDER — PREDNISONE 10 MG PO TABS: ORAL_TABLET | ORAL | 0 refills | Status: DC

## 2018-05-23 MED ORDER — BENZONATATE 100 MG PO CAPS: 100.0000 mg | ORAL_CAPSULE | Freq: Three times a day (TID) | ORAL | 1 refills | Status: DC

## 2018-05-23 MED ORDER — AMOXICILLIN-POT CLAVULANATE 875-125 MG PO TABS: 1.0000 | ORAL_TABLET | Freq: Two times a day (BID) | ORAL | 0 refills | Status: DC

## 2018-05-23 NOTE — Progress Notes (Signed)
@Patient  ID: [name], female    DOB: 01/09/1951, 67 y.o.   MRN: [id number]  Chief Complaint  Patient presents with  . 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  The irritation with chronic allergic rhinitis.  She has secondary pulmonary hypertension in the setting of this as well as hypertension, diastolic CHF, atrial fibrillation, she is followed by Dr. Lamonte Sakai.  Tests: CXR 09/02/2017>> Right middle lobe pneumonia. Followup PA and lateral chest X-ray is recommended in 3-4 weeks following trial of antibiotic therapy to ensure resolution.  09/02/2017>> WBC count: 21.1  09/02/2017>> Flu Swab : Negative  05/23/18>>flu swab: negative  OV 05/23/18 - acute fever/cough Patient presents with fever and cough with chest congestion.  States that symptoms started a couple days ago.  She states that her fever was 101.0 F this morning.  She took Tylenol moderate relief noted.  He had a low-grade fever of 99.1 F in office this morning.  He states that cough is productive of green sputum.  She denies any chest pain or edema.  He denies any sinus congestion.  Patient is compliant with medications.  He was swabbed in the office for flu today and was negative.     Allergies  Allergen Reactions  . Iodine Anaphylaxis  . Shellfish Allergy Anaphylaxis  . Iohexol      Code: HIVES, Desc: reaction since childhood, Onset Date: 15400867   . Levaquin [Levofloxacin In D5w] Nausea And Vomiting    dizziness    Immunization History  Administered Date(s) Administered  . Influenza Split 02/26/2012  . Influenza Whole 02/25/2009  . Influenza, High Dose Seasonal PF 02/26/2016  . Influenza,inj,Quad PF,6+ Mos 04/27/2013, 02/28/2015  . Influenza-Unspecified 03/03/2014, 03/28/2017  . Pneumococcal Conjugate-13 03/26/2014  . Pneumococcal Polysaccharide-23  05/28/2006, 04/08/2012    Past Medical History:  Diagnosis Date  . Allergic rhinitis   . Anemia   . Asthma   . Asthma   . Atrial fibrillation ([place])    s/p RFA and Maze procedure - resolved  . Atrial tachycardia ([place])   . Complication of anesthesia    trouble waking up  . Depression   . Dyspnea   . Exercise hypoxemia    supplemental O2 PRN  . Fatigue   . Gastritis    NSAID induced  . GERD (gastroesophageal reflux disease)   . Heart murmur    severe MR s/p MV repair  . Hx of asplenia    surgical  . Hypertension   . 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 Myoclonus   . Osteoporosis   . Pulmonary HTN ([place])    mild to moderate by [place] 05/2012  . Pyruvate kinase deficiency hemolytic anemia ([place])   . Syncope and collapse 2012   r/t anemia  . Trochanteric bursitis    left hip    Tobacco History: Social History   Tobacco Use  Smoking Status Never Smoker  Smokeless Tobacco Never Used   Counseling given: Yes   Outpatient Encounter Medications as of 05/23/2018  Medication Sig  . acetaminophen (TYLENOL 8 HOUR) 650 MG CR tablet Take 1 tablet (650 mg total) by mouth every 8 (eight) hours as needed. (Patient taking differently: Take 650 mg by mouth every 8 (eight) hours as needed for pain. )  . albuterol (PROVENTIL HFA;VENTOLIN HFA) 108 (90 Base)  MCG/ACT inhaler Inhale 2 puffs into the lungs every 6 (six) hours as needed for wheezing or shortness of breath.  . ALPRAZolam (XANAX) 0.5 MG tablet Take 0.5 mg by mouth at bedtime.  Marland Kitchen augmented betamethasone dipropionate (DIPROLENE-AF) 0.05 % ointment Apply 1 application topically daily as needed for rash.  Marland Kitchen BREO ELLIPTA 100-25 MCG/INH AEPB INHALE 1 PUFF BY MOUTH EVERY DAY (Patient taking differently: Inhale 1 puff into the lungs daily. )  . calcium-vitamin D (OSCAL WITH D) 500-200 MG-UNIT tablet Take 1 tablet by mouth 2 (two) times daily.  . chlorpheniramine (CHLOR-TRIMETON) 4 MG tablet Take  4 mg by mouth at bedtime.  . cholecalciferol (VITAMIN D) 1000 UNITS tablet Take 1,000 Units by mouth 2 (two) times daily.   . clonazePAM (KLONOPIN) 1 MG tablet Take 2 mg by mouth at bedtime.   Marland Kitchen deferasirox (EXJADE) 500 MG disintegrating tablet Take 3 tablets (1,500 mg total) by mouth daily before breakfast. Faxed to [employer] Patient [place] [phone] (Patient taking differently: Take 1,500 mg by mouth daily before supper. Faxed to [employer] [phone])  . diclofenac sodium (VOLTAREN) 1 % GEL Apply 2 g topically 4 (four) times daily as needed. (Patient taking differently: Apply 2 g topically 4 (four) times daily as needed (for pain). )  . diltiazem (CARDIZEM CD) 240 MG 24 hr capsule Take 1 capsule (240 mg total) by mouth daily.  Marland Kitchen diltiazem (TIAZAC) 300 MG 24 hr capsule Take 1 capsule (300 mg total) by mouth daily.  Marland Kitchen ELIQUIS 5 MG TABS tablet TAKE 1 TABLET BY MOUTH TWICE A DAY  . EPINEPHrine (EPI-PEN) 0.3 mg/0.3 mL DEVI Inject 0.3 mg into the muscle once as needed (for allergic reaction).   Marland Kitchen escitalopram (LEXAPRO) 10 MG tablet Take 10 mg by mouth at bedtime.   . fexofenadine (ALLEGRA) 180 MG tablet Take 180 mg by mouth daily after breakfast.   . fluticasone (FLONASE) 50 MCG/ACT nasal spray Place 1 spray into both nostrils daily after breakfast.   . folic acid (FOLVITE) 387 MCG tablet Take 800 mcg by mouth daily after breakfast.   . KLOR-CON M20 20 MEQ tablet TAKE 3 TABLETS (60 MEQ TOTAL) 3 (THREE) TIMES DAILY BY MOUTH.  . metolazone (ZAROXOLYN) 2.5 MG tablet Take 1 tablet (2.5 mg total) by mouth daily as needed (arthritis). (Patient taking differently: Take 2.5 mg by mouth daily as needed (if weight gain of 3lbs over night or 5 lbs in a week). )  . metoprolol succinate (TOPROL-XL) 50 MG 24 hr tablet TAKE 1 TABLET (50 MG TOTAL) BY MOUTH 2 (TWO) TIMES DAILY. TAKE WITH OR IMMEDIATELY FOLLOWING A MEAL.  . Multiple Vitamins-Minerals (AIRBORNE) TBEF Take 1  tablet by mouth 2 (two) times daily. *Dissolves in water twice a day*  . OXYGEN Inhale 3 L into the lungs continuous.   . pantoprazole (PROTONIX) 40 MG tablet Take 1 tablet (40 mg total) by mouth daily before breakfast.  . raloxifene (EVISTA) 60 MG tablet Take 60 mg by mouth daily with breakfast.   . senna-docusate (SENOKOT-S) 8.6-50 MG tablet Take 1 tablet by mouth at bedtime as needed for mild constipation.  . sodium chloride (OCEAN) 0.65 % SOLN nasal spray Place 1 spray into both nostrils 2 (two) times daily.   Marland Kitchen spironolactone (ALDACTONE) 25 MG tablet TAKE 1 TABLET BY MOUTH EVERY DAY  . torsemide (DEMADEX) 20 MG tablet TAKE 2 TABLETS (40 MG TOTAL) BY MOUTH 2 (TWO) TIMES DAILY.  . traMADol (ULTRAM) 50 MG tablet  Take 50 mg by mouth 4 (four) times daily.  Marland Kitchen amoxicillin-clavulanate (AUGMENTIN) 875-125 MG tablet Take 1 tablet by mouth 2 (two) times daily.  . benzonatate (TESSALON) 100 MG capsule Take 1 capsule (100 mg total) by mouth 3 (three) times daily.  . predniSONE (DELTASONE) 10 MG tablet Take 4 tabs for 2 days, then 3 tabs for 2 days, then 2 tabs for 2 days, then 1 tab for 2 days, then stop   No facility-administered encounter medications on file as of 05/23/2018.      Review of Systems  Review of Systems  Constitutional: Positive for chills and fever.  HENT: Negative.  Negative for congestion, sinus pressure and sinus pain.   Respiratory: Positive for cough and shortness of breath. Negative for wheezing.   Cardiovascular: Negative.  Negative for chest pain, palpitations and leg swelling.  Gastrointestinal: Negative.   Allergic/Immunologic: Negative.   Neurological: Negative.   Psychiatric/Behavioral: Negative.        Physical Exam  BP 128/66 (BP Location: Right Arm, Patient Position: Sitting, Cuff Size: Normal)   Pulse 81   Temp 99.1 F (37.3 C)   Ht 5' 2.25" (1.581 m)   Wt 161 lb 6.4 oz (73.2 kg)   SpO2 94% Comment: on 3L pulse  BMI 29.28 kg/m   Wt Readings from  Last 5 Encounters:  05/23/18 161 lb 6.4 oz (73.2 kg)  04/29/18 163 lb (73.9 kg)  04/11/18 167 lb (75.8 kg)  03/21/18 161 lb (73 kg)  03/14/18 159 lb 6.4 oz (72.3 kg)     Physical Exam Vitals signs and nursing note reviewed.  Constitutional:      General: She is not in acute distress.    Appearance: She is well-developed.  Cardiovascular:     Rate and Rhythm: Normal rate and regular rhythm.  Pulmonary:     Effort: Pulmonary effort is normal. No respiratory distress.     Breath sounds: Normal breath sounds. No wheezing or rhonchi.  Musculoskeletal:        General: No swelling.  Neurological:     Mental Status: She is alert and oriented to person, place, and time.       Assessment & Plan:   Bronchitis Patient Instructions  Flu swab was negative in office today Will order Augmentin Will order prednisone Will order tessalon pearls Continue trelegy inhaler May take mucinex  Continue tylenol as needed for fever  General instructions:  Hydrate  Drink enough water to keep your pee (urine) clear or pale yellow. Rest  Rest as much as possible.  Sleep with your head raised (elevated).  Make sure to get enough sleep each night. General instructions  Put a warm, moist washcloth on your face 3-4 times a day or as told by your doctor. This will help with discomfort.  Wash your hands often with soap and water. If there is no soap and water, use hand sanitizer.  Do not smoke. Avoid being around people who are smoking (secondhand smoke). Call the office if:  You have a fever.  Your symptoms get worse.  Your symptoms do not get better within 10 days.  Follow up with Dr. Lamonte Sakai in 2 weeks or sooner if needed        Fenton Foy, NP 05/23/2018

## 2018-05-23 NOTE — Assessment & Plan Note (Signed)
Patient Instructions  Flu swab was negative in office today Will order Augmentin Will order prednisone Will order tessalon pearls Continue trelegy inhaler May take mucinex  Continue tylenol as needed for fever  General instructions:  Hydrate  Drink enough water to keep your pee (urine) clear or pale yellow. Rest  Rest as much as possible.  Sleep with your head raised (elevated).  Make sure to get enough sleep each night. General instructions  Put a warm, moist washcloth on your face 3-4 times a day or as told by your doctor. This will help with discomfort.  Wash your hands often with soap and water. If there is no soap and water, use hand sanitizer.  Do not smoke. Avoid being around people who are smoking (secondhand smoke). Call the office if:  You have a fever.  Your symptoms get worse.  Your symptoms do not get better within 10 days.  Follow up with Dr. Lamonte Sakai in 2 weeks or sooner if needed

## 2018-05-23 NOTE — Patient Instructions (Signed)
Flu swab was negative in office today Will order Augmentin Will order prednisone Will order tessalon pearls Continue trelegy inhaler May take mucinex  Continue tylenol as needed for fever  General instructions:  Hydrate  Drink enough water to keep your pee (urine) clear or pale yellow. Rest  Rest as much as possible.  Sleep with your head raised (elevated).  Make sure to get enough sleep each night. General instructions  Put a warm, moist washcloth on your face 3-4 times a day or as told by your doctor. This will help with discomfort.  Wash your hands often with soap and water. If there is no soap and water, use hand sanitizer.  Do not smoke. Avoid being around people who are smoking (secondhand smoke). Call the office if:  You have a fever.  Your symptoms get worse.  Your symptoms do not get better within 10 days.  Follow up with Dr. Lamonte Sakai in 2 weeks or sooner if needed

## 2018-05-23 NOTE — Telephone Encounter (Signed)
Pt reports that she is sick and just got prednisone & abx ordered today.  Pt would like to wait until completed before starting a new medication. Pt aware I will follow up with her next week or week after to review new medication, Amiodarone, and instructions. She is agreeable to this plan.

## 2018-06-09 ENCOUNTER — Ambulatory Visit: Payer: Medicare Other | Admitting: Emergency Medicine

## 2018-06-09 ENCOUNTER — Inpatient Hospital Stay (HOSPITAL_BASED_OUTPATIENT_CLINIC_OR_DEPARTMENT_OTHER): Payer: Medicare Other | Admitting: Oncology

## 2018-06-09 ENCOUNTER — Inpatient Hospital Stay: Payer: Medicare Other | Attending: Oncology

## 2018-06-09 ENCOUNTER — Telehealth: Payer: Self-pay

## 2018-06-09 DIAGNOSIS — Z79899 Other long term (current) drug therapy: Secondary | ICD-10-CM

## 2018-06-09 DIAGNOSIS — D649 Anemia, unspecified: Secondary | ICD-10-CM | POA: Insufficient documentation

## 2018-06-09 DIAGNOSIS — I4891 Unspecified atrial fibrillation: Secondary | ICD-10-CM | POA: Insufficient documentation

## 2018-06-09 DIAGNOSIS — E744 Disorders of pyruvate metabolism and gluconeogenesis: Secondary | ICD-10-CM

## 2018-06-09 LAB — CBC WITH DIFFERENTIAL (CANCER CENTER ONLY)
Abs Immature Granulocytes: 0.31 10*3/uL — ABNORMAL HIGH (ref 0.00–0.07)
BASOS PCT: 0 %
Basophils Absolute: 0.1 10*3/uL (ref 0.0–0.1)
Eosinophils Absolute: 0.7 10*3/uL — ABNORMAL HIGH (ref 0.0–0.5)
Eosinophils Relative: 5 %
HCT: 25.3 % — ABNORMAL LOW (ref 36.0–46.0)
Hemoglobin: 7.7 g/dL — ABNORMAL LOW (ref 12.0–15.0)
Immature Granulocytes: 2 %
LYMPHS PCT: 22 %
Lymphs Abs: 3.3 10*3/uL (ref 0.7–4.0)
MCH: 37.6 pg — AB (ref 26.0–34.0)
MCHC: 30.4 g/dL (ref 30.0–36.0)
MCV: 123.4 fL — ABNORMAL HIGH (ref 80.0–100.0)
Monocytes Absolute: 2.6 10*3/uL — ABNORMAL HIGH (ref 0.1–1.0)
Monocytes Relative: 18 %
NEUTROS ABS: 7.7 10*3/uL (ref 1.7–7.7)
NEUTROS PCT: 53 %
PLATELETS: 422 10*3/uL — AB (ref 150–400)
RBC: 2.05 MIL/uL — AB (ref 3.87–5.11)
RDW: 15.3 % (ref 11.5–15.5)
WBC: 14.7 10*3/uL — AB (ref 4.0–10.5)
nRBC: 2.5 % — ABNORMAL HIGH (ref 0.0–0.2)

## 2018-06-09 LAB — FERRITIN: FERRITIN: 143 ng/mL (ref 11–307)

## 2018-06-09 NOTE — Progress Notes (Signed)
  [facility] OFFICE PROGRESS NOTE   Diagnosis: Pyruvate kinase deficiency  INTERVAL HISTORY:   Ms. Juma returns as scheduled.  She continues Exjade.  She takes the Exjade 3 days/week since she no longer has insurance or company assistance to pay for the medication.  She was recently treated for an upper respiratory infection.  She reports somnolence and fatigue.  She is followed closely by cardiology.  Objective:  Vital signs in last 24 hours:  Blood pressure 117/62, pulse 60, temperature 98 F (36.7 C), temperature source Oral, resp. rate 18, height 5' 2.25" (1.581 m), weight 163 lb 14.4 oz (74.3 kg), SpO2 99 %.   Resp: End inspiratory rales at the lower posterior chest, no respiratory distress Cardio: Regular rate and rhythm GI: No hepatosplenomegaly Vascular: No leg edema   Lab Results:  Lab Results  Component Value Date   WBC 14.7 (H) 06/09/2018   HGB 7.7 (L) 06/09/2018   HCT 25.3 (L) 06/09/2018   MCV 123.4 (H) 06/09/2018   PLT 422 (H) 06/09/2018   NEUTROABS 7.7 06/09/2018   Ferritin-143 Medications: I have reviewed the patient's current medications.   Assessment/Plan: 1. Hereditary pyruvate kinase deficiency, chronic anemia 2. 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 History of atrial fibrillation/flutter, status post a Maze procedure 4. History of mitral regurgitation, status post mitral valve repair by Dr. [name] in February 2010 5. 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 History of right diaphragm paralysis 7. Admission 04/02/2016 with left lung pneumonia    Disposition: Ms. Labarbera has pyruvate kinase deficiency.  She has stable chronic anemia.  She is followed by cardiology for management of heart failure and paroxysmal atrial fibrillation. The ferritin level is above goal range.  This may be related to the reduced dose of Exjade she is currently taking. I will asked the pharmacy  to check into the available help with paying for Exjade or other iron chelators.  She will continue Exjade on the current schedule.  Ms. Folden will return for a lab visit in 3 months and an office visit in 6 months.  She continues close follow-up with cardiology and pulmonary medicine. [name], MD  06/09/2018  11:30 AM

## 2018-06-09 NOTE — Telephone Encounter (Signed)
Printed avs and calender of upcoming appointment. Per 1/13 los. Patient requested 1 week delay on new appointments

## 2018-06-10 ENCOUNTER — Telehealth: Payer: Self-pay | Admitting: *Deleted

## 2018-06-10 ENCOUNTER — Encounter: Payer: Self-pay | Admitting: Emergency Medicine

## 2018-06-10 ENCOUNTER — Telehealth: Payer: Self-pay

## 2018-06-10 ENCOUNTER — Ambulatory Visit (INDEPENDENT_AMBULATORY_CARE_PROVIDER_SITE_OTHER): Payer: Medicare Other | Admitting: Emergency Medicine

## 2018-06-10 DIAGNOSIS — G4733 Obstructive sleep apnea (adult) (pediatric): Secondary | ICD-10-CM

## 2018-06-10 DIAGNOSIS — J9611 Chronic respiratory failure with hypoxia: Secondary | ICD-10-CM | POA: Diagnosis not present

## 2018-06-10 DIAGNOSIS — J301 Allergic rhinitis due to pollen: Secondary | ICD-10-CM

## 2018-06-10 DIAGNOSIS — K219 Gastro-esophageal reflux disease without esophagitis: Secondary | ICD-10-CM

## 2018-06-10 DIAGNOSIS — J45909 Unspecified asthma, uncomplicated: Secondary | ICD-10-CM | POA: Diagnosis not present

## 2018-06-10 DIAGNOSIS — I482 Chronic atrial fibrillation, unspecified: Secondary | ICD-10-CM

## 2018-06-10 NOTE — Assessment & Plan Note (Signed)
Continue your oxygen at 3 L/min 

## 2018-06-10 NOTE — Assessment & Plan Note (Signed)
.   Please continue Allegra, fluticasone nasal spray, chlorpheniramine as you are taking them.

## 2018-06-10 NOTE — Telephone Encounter (Signed)
thanks

## 2018-06-10 NOTE — Progress Notes (Signed)
Subjective:    Patient ID: [name], female    DOB: Nov 06, 1950, 68 y.o.   MRN: [id number]  Asthma  She complains of shortness of breath. There is no cough or wheezing. Pertinent negatives include no ear pain, fever, headaches, postnasal drip, rhinorrhea, sneezing, sore throat or trouble swallowing. Her past medical history is significant for asthma.  Cough  Associated symptoms include shortness of breath. Pertinent negatives include no ear pain, eye redness, fever, headaches, postnasal drip, rash, rhinorrhea, sore throat or wheezing. Her past medical history is significant for asthma.    ROV 11/05/17 --follow-up visit for history of asthma, obstructive sleep apnea for which she uses CPAP.  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  She has atrial fibrillation, hypertension, mitral valve disease resulting in diastolic dysfunction and secondary pulmonary hypertension.  She wears oxygen set at 3L/min.  She is currently managed on Breo.  She is been using albuterol rarely. She is feeling closer to baseline now. She has been having some increased anxiety, has been back in A fib. She is not having any cough or wheeze. She is on allegra, flonase, chlorpheniramine. She is on protonix.   ROV 06/10/18 --68 year old woman with a history of asthma documented on pulmonary function testing 10/2016, obstructive sleep apnea for which she uses CPAP, some superimposed restrictive lung disease due to weight and a paralyzed hemidiaphragm.  She also has atrial fibrillation, hypertension, mitral valve disease resulting in diastolic dysfunction and pulmonary hypertension; cardioverted from A Fib since our last visit.  She is on Exjade for secondary iron overload in the setting of pyruvate kinase deficiency and chronic anemia.  She has chronic cough in the setting of GERD and allergic rhinitis, on allegra, flonase, chlorpheniramine, protonix.  She is on 3 L/min at all times  She  was seen here in late December for an apparent acute bronchitis, was treated with Augmentin and prednisone.  She reports that she is improved - back to baseline. She flares about once a year. Remains on Breo. Good compliance with her CPAP, good clinical response.     Review of Systems  Constitutional: Negative for fever and unexpected weight change.  HENT: Positive for congestion and voice change. Negative for dental problem, ear pain, nosebleeds, postnasal drip, rhinorrhea, sinus pressure, sneezing, sore throat and trouble swallowing.   Eyes: Negative for redness and itching.  Respiratory: Positive for shortness of breath. Negative for cough, chest tightness and wheezing.   Cardiovascular: Negative for palpitations and leg swelling.  Gastrointestinal: Negative for nausea and vomiting.  Genitourinary: Negative for dysuria.  Musculoskeletal: Negative for joint swelling.  Skin: Negative for rash.  Neurological: Negative for headaches.  Hematological: Does not bruise/bleed easily.  Psychiatric/Behavioral: Negative for dysphoric mood. The patient is not nervous/anxious.        Objective:   Physical Exam  Vitals:   06/10/18 1518  BP: 130/74  Pulse: 74  SpO2: 91%  Weight: 166 lb (75.3 kg)  Height: 5\' 2"  (1.575 m)  Gen: Pleasant, well-nourished, in no distress,  normal affect  ENT: No lesions,  mouth clear,  oropharynx clear, some postnasal drip, hoarse voice   Neck: No JVD, no stridor  Lungs: No use of accessory muscles, clear B   Cardiovascular: RRR, 2/6 M  Musculoskeletal: No deformities, no cyanosis or clubbing  Neuro: alert, non focal  Skin: Warm, no lesions or rashes      Assessment & Plan:  Obstructive sleep apnea Please continue your CPAP  every night.   Chronic atrial fibrillation  Follow-up with Dr. Haroldine Laws regarding your atrial fibrillation.  We talked today about the potential risk associated with amiodarone treatment.  There is no absolute contraindication  to using amiodarone but you are at increased risk for lung problems on this medication be because you use supplemental oxygen.   Asthma, intrinsic Please continue Breo 1 inhalation daily.  Rinse and gargle after using. Keep your albuterol available use 2 puffs if needed for shortness of breath, chest tightness, wheezing.  Chronic respiratory failure (HCC)  Continue your oxygen at 3 L/min  Allergic rhinitis . Please continue Allegra, fluticasone nasal spray, chlorpheniramine as you are taking them.  GERD Continue Protonix  Baltazar Apo, MD, PhD 06/10/2018, 3:46 PM [facility] [phone] or if no answer [phone]

## 2018-06-10 NOTE — Assessment & Plan Note (Signed)
Continue Protonix °

## 2018-06-10 NOTE — Telephone Encounter (Signed)
Notified patient of increase in her ferritin level and MD wants to recheck in 3 months as scheduled. She understands and agrees. She will increase her Exjade to every other day for now. She is aware that pharmacy is looking into patient assistance for the Exjade or another oral iron chelator.

## 2018-06-10 NOTE — Assessment & Plan Note (Signed)
Please continue Breo 1 inhalation daily.  Rinse and gargle after using. Keep your albuterol available use 2 puffs if needed for shortness of breath, chest tightness, wheezing.

## 2018-06-10 NOTE — Assessment & Plan Note (Signed)
  Follow-up with Dr. Haroldine Laws regarding your atrial fibrillation.  We talked today about the potential risk associated with amiodarone treatment.  There is no absolute contraindication to using amiodarone but you are at increased risk for lung problems on this medication be because you use supplemental oxygen.

## 2018-06-10 NOTE — Patient Instructions (Addendum)
Please continue your CPAP every night. Continue your oxygen at 3 L/min Please continue Breo 1 inhalation daily.  Rinse and gargle after using. Keep your albuterol available use 2 puffs if needed for shortness of breath, chest tightness, wheezing. Please continue Allegra, fluticasone nasal spray, chlorpheniramine as you are taking them. Please continue Protonix as you are taking it. Follow-up with Dr. Haroldine Laws regarding your atrial fibrillation.  We talked today about the potential risk associated with amiodarone treatment.  There is no absolute contraindication to using amiodarone but you are at increased risk for lung problems on this medication be because you use supplemental oxygen. Follow with Dr Lamonte Sakai in 6 months or sooner if you have any problems

## 2018-06-10 NOTE — Telephone Encounter (Signed)
Oral Oncology Patient Advocate Encounter  I received a staff message that the patient needed financial assistance for her Exjade. I found that the patient has medicare part D but her copay is $2379.90. There are no grant funds open for her disease state. I have completed a Ambulance person for her and faxed it to [phone].   I spoke to the patient and went over this with her, she verbalized understanding and great appreciation.  This encounter will be updated until final determination.  [place] Patient [place] Phone [phone] Fax [phone]

## 2018-06-10 NOTE — Assessment & Plan Note (Signed)
Please continue your CPAP every night.

## 2018-06-11 NOTE — Telephone Encounter (Signed)
Called pt to get Amiodarone started. Pt tells me that she saw her Dr. Learta Codding Monday and Dr. Lamonte Sakai yesterday.  She states that they don't think she should start amiodarone. Both physicians feel like she is controlled AFib at the moment. But they also understand that it could change at any time. She states "that Dr. Lamonte Sakai stated that if [place] thought that Amiodarone would be the only thing to keep her in rhythm then she could start it but would need very close monitoring".  She wants both doctors to know that she is not opposed to starting medication but has so many other issues and wants the 3 physicians ([place], [place], [place]) to collaborate on best course of treatment.  Pt would like Dr. Curt Bears and Dr. Haroldine Laws to review and advise. She understands I will follow up once advised on. Will forward to [employer] and [place]

## 2018-06-17 NOTE — Telephone Encounter (Signed)
Oral Oncology Patient Advocate Encounter  [employer] called 06/16/18 needing the form that the physician signed, I refaxed that 06/16/18.  I followed up today 1/21 and the fax has not reached their system yet.  This encounter will be updated until final determination.  [place] Patient [place] Phone [phone] Fax [phone]

## 2018-06-23 ENCOUNTER — Telehealth: Payer: Self-pay

## 2018-06-23 NOTE — Telephone Encounter (Signed)
Oral Oncology Patient Advocate Encounter  Prior Authorization for Exjade has been approved.    PA# [id number] Effective dates: 06/10/18 until further notice  Oral Oncology Clinic will continue to follow.   [place] Patient [place] Phone [phone] Fax [phone]

## 2018-06-23 NOTE — Telephone Encounter (Signed)
Oral Oncology Patient Advocate Encounter  Received notification from [facility] that prior authorization for Exjade is required.  PA submitted on CoverMyMeds Key [id number] Status is pending  Oral Oncology Clinic will continue to follow.  [place] Patient [place] Phone [phone] Fax [phone]

## 2018-06-24 NOTE — Telephone Encounter (Signed)
Oral Oncology Patient Advocate Encounter  [employer] needed a prior authorization, I did that and it was approved. I called [employer] and gave them the approval information, I also faxed it to them.  This encounter will be updated until final determination.  I called the patient and updated her, she verbalized understanding and appreciation.  [place] Patient [place] Phone [phone] Fax [phone]

## 2018-06-25 NOTE — Telephone Encounter (Signed)
Oral Oncology Patient Advocate Encounter  I followed up with [employer] today and they have received the PA approval information. The application is in processing.  This encounter will be updated until final determination.  [place] Patient [name] Phone [phone] Fax [phone]

## 2018-07-07 ENCOUNTER — Other Ambulatory Visit: Payer: Self-pay | Admitting: Emergency Medicine

## 2018-07-08 NOTE — Telephone Encounter (Signed)
Oral Oncology Patient Advocate Encounter  I followed up with [employer] and they needed the patient form re-done. I did that on the website and submitted it again today at 07/08/18.  This encounter will be updated until final determination.  [place] Patient [place] Phone [phone] Fax [phone]

## 2018-07-10 NOTE — Telephone Encounter (Signed)
Oral Oncology Patient Advocate Encounter  07/10/18 update: [employer] has what they need and they are moving forward in the application processing.  I called the patient and updated her, she verbalized appreciation.  [place] Patient [place] Phone [phone] Fax [phone]

## 2018-07-14 NOTE — Telephone Encounter (Signed)
Oral Oncology Patient Advocate Encounter  I followed up with [employer] on the [employer] assistance application and they stated that this application was in the final processing stage and should have a decision in 3-5 business days.  This encounter will be updated until final determination.  [name] Patient [place] Phone [phone] Fax [phone]

## 2018-07-18 NOTE — Telephone Encounter (Signed)
Oral Oncology Patient Advocate Encounter  Exjade has been approved to be filled with [employer] patient assistance program 07/18/18-05/28/19. Exjade will be filled with their contracted pharmacy, [facility] and can be escribed, they will ship the medicine directly to the patients home free of charge.  [employer] will schedule shipment for the first fill. After that, 7-10 days before the patient runs out she will need to call [phone] to get her refill.  I called the patient to give her the good news and left a message with her husband to return my call.  [place] Patient [place] Phone [phone] Fax [phone]

## 2018-07-19 NOTE — Telephone Encounter (Signed)
thanks

## 2018-07-21 ENCOUNTER — Other Ambulatory Visit (HOSPITAL_COMMUNITY): Payer: Self-pay | Admitting: Internal Medicine

## 2018-07-22 ENCOUNTER — Other Ambulatory Visit: Payer: Self-pay | Admitting: Cardiology

## 2018-07-22 MED ORDER — DILTIAZEM HCL ER BEADS 300 MG PO CP24: 300.0000 mg | ORAL_CAPSULE | Freq: Every day | ORAL | 9 refills | Status: AC

## 2018-07-22 NOTE — Telephone Encounter (Signed)
Med to be filled by pcp

## 2018-07-29 DIAGNOSIS — M25561 Pain in right knee: Secondary | ICD-10-CM | POA: Diagnosis not present

## 2018-07-30 ENCOUNTER — Ambulatory Visit (INDEPENDENT_AMBULATORY_CARE_PROVIDER_SITE_OTHER): Payer: Medicare Other

## 2018-07-30 ENCOUNTER — Encounter (INDEPENDENT_AMBULATORY_CARE_PROVIDER_SITE_OTHER): Payer: Self-pay | Admitting: Orthopaedic Surgery

## 2018-07-30 ENCOUNTER — Ambulatory Visit (INDEPENDENT_AMBULATORY_CARE_PROVIDER_SITE_OTHER): Payer: Medicare Other | Admitting: Orthopaedic Surgery

## 2018-07-30 DIAGNOSIS — S8001XA Contusion of right knee, initial encounter: Secondary | ICD-10-CM | POA: Diagnosis not present

## 2018-07-30 DIAGNOSIS — M25561 Pain in right knee: Secondary | ICD-10-CM

## 2018-07-30 NOTE — Progress Notes (Signed)
Office Visit Note   Patient: [name]           Date of Birth: [birth year]           MRN: [id number] Visit Date: 07/30/2018              Requested by: Josetta Huddle, MD [address] [place], [place] [address] PCP: Josetta Huddle, MD   Assessment & Plan: Visit Diagnoses:  1. Acute pain of right knee   2. Traumatic hematoma of right knee, initial encounter     Plan: I did give her reassurance that this hematoma will likely resolve slowly.  She does not need any type of surgical intervention for now.  Certainly if it becomes uncomfortable for her, we can make an incision in the office after anesthetizing the area and evacuate the hematoma.  There are certainly risks of bleeding with that procedure as well as risk of infection.  I do feel a compressive wrap on this daily with elevation and intermittent ice and heat alternating these will help extensively.  She agrees with this treatment plan as well.  I am on call this weekend so obviously if it worsens in any way she will let us know.  She can resume her Eliquis starting this Friday.  She understands that as well.  We will see her back next week to assess her soft tissues.  I gave them extra Ace wrap as well as instructions of when to change this.  If there is any issues prior to then they will let us know.  Follow-Up Instructions: Return in about 1 week (around 08/06/2018).   Orders:  Orders Placed This Encounter  Procedures  . XR Knee 1-2 Views Right   No orders of the defined types were placed in this encounter.     Procedures: No procedures performed   Clinical Data: No additional findings.   Subjective: Chief Complaint  Patient presents with  . Right Knee - Pain, Injury  Patient is a very pleasant 68 year old female who sustained a mechanical fall on Monday when she tripped and fell over a box.  She injured her right knee.  She is on the blood thinner Eliquis.  She developed a large hematoma and  swelling and was seen by Dr. Inda Merlin yesterday who attempted to aspirate the hematoma was unsuccessful.  He placed a compressive wrap around this and she said she did bleed during the evening.  She has been off Eliquis now since just yesterday with her first dose in the morning but not her evening dose.  She is on oxygen and has multiple medical issues.  Her daughter is with her today.  She has been able to walk on her knee and does report discomfort from the swelling but overall is doing well.  HPI  Review of Systems She is on home oxygen.  She denies any chest pain, fever, chills, nausea, vomiting  Objective: Vital Signs: There were no vitals taken for this visit.  Physical Exam She is alert and orient x3 and in no acute distress Ortho Exam Examination of her right knee shows her extensor mechanism is intact.  She does have a large hematoma in the soft tissues.  She is weeping some from edema around her knee and from the area where she had attempts at aspirating hematoma.  Her calf is soft.  Her leg is well perfused.  There is no areas of necrosis of the soft tissue.  Her knee feels  ligamentously stable. Specialty Comments:  No specialty comments available.  Imaging: Xr Knee 1-2 Views Right  Result Date: 07/30/2018 2 views of the right knee show no acute findings other than large soft tissue swelling.  There is no evidence of fracture or malalignment.    PMFS History: Patient Active Problem List   Diagnosis Date Noted  . Bronchitis 05/23/2018  . Chronic atrial fibrillation 03/10/2018  . CAP (community acquired pneumonia) 09/02/2017  . 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 FUO (fever of unknown origin) 07/11/2016  . Fever 07/10/2016  . Anemia   . Intractable back pain   . Absolute anemia   . Abdominal pain, acute, generalized 04/02/2016  . Chronic respiratory failure ([place]) 10/12/2014  . Chronic anticoagulation- Eliquis started 08/26/13 09/08/2013  . Normal coronary arteries- 2011  09/08/2013  . Chronic diastolic CHF (congestive heart failure) ([place]) 08/26/2013  . Heart murmur   . Mitral regurgitation- MV repair 9/11   . Exercise hypoxemia   . Pulmonary HTN ([place]) 06/16/2012  .  hemolytic anemia 09/02/2009  . Asthma, intrinsic 09/02/2009  . 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  09/02/2009  . Obstructive sleep apnea 08/03/2009  . DYSPNEA 08/03/2009  . Essential hypertension 08/02/2009  . PAF- recurrent, Maze 9/11 08/02/2009  . Allergic rhinitis 08/02/2009  . GERD 08/02/2009   Past Medical History:  Diagnosis Date  . Allergic rhinitis   . Anemia   . Asthma   . Asthma   . Atrial fibrillation ([place])    s/p RFA and Maze procedure - resolved  . Atrial tachycardia ([place])   . Complication of anesthesia    trouble waking up  . Depression   . Dyspnea   . Exercise hypoxemia    supplemental O2 PRN  . Fatigue   . Gastritis    NSAID induced  . GERD (gastroesophageal reflux disease)   . Heart murmur    severe MR s/p MV repair  . Hx of asplenia    surgical  . Hypertension   . 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 Myoclonus   . Osteoporosis   . Pulmonary HTN ([place])    mild to moderate by [place] 05/2012  . Pyruvate kinase deficiency hemolytic anemia ([place])   . Syncope and collapse 2012   r/t anemia  . Trochanteric bursitis    left hip    Family History  Problem Relation Age of Onset  . Breast cancer Mother 40       ER+  . Melanoma Father 81  . Adrenal disorder Sister        pheochromacytoma - rt. adrenal gland  . COPD Maternal Aunt   . Stomach cancer Paternal Uncle        onset in 79s  . Lung cancer Maternal Grandmother        died late 28s, heavy smoker  . Heart disease Maternal Grandfather        hardening of the arteries, poor circulation  . Ovarian cancer Sister 58       stage IV, now 42    Past Surgical History:  Procedure Laterality Date  . ABLATION     afib  . CARDIAC CATHETERIZATION N/A 10/29/2014    Procedure: Right Heart Cath;  Surgeon: Larey Dresser, MD;  Location: [facility];  Service: Cardiovascular;  Laterality: N/A;  . CARDIOVERSION N/A 09/11/2013   Procedure: CARDIOVERSION;  Surgeon: Jolaine Artist, MD;  Location: [facility];  Service: Cardiovascular;  Laterality: N/A;  . CARDIOVERSION N/A 11/08/2016   Procedure: CARDIOVERSION;  Surgeon: Jolaine Artist, MD;  Location: [facility] ENDOSCOPY;  Service: Cardiovascular;  Laterality: N/A;  . CARDIOVERSION N/A 03/14/2018   Procedure: CARDIOVERSION;  Surgeon: Jolaine Artist, MD;  Location: [facility] ENDOSCOPY;  Service: Cardiovascular;  Laterality: N/A;  . CHOLECYSTECTOMY    . MAZE     with MV repair  . MITRAL VALVE REPAIR  01/2010   with MAZE  . myocardial window  2009   s/p afib ablation  . RIGHT HEART CATH N/A 11/22/2016   Procedure: Right Heart Cath;  Surgeon: Jolaine Artist, MD;  Location: [facility];  Service: Cardiovascular;  Laterality: N/A;  . SPLENECTOMY    . TEE WITHOUT CARDIOVERSION N/A 09/11/2013   Procedure: TRANSESOPHAGEAL ECHOCARDIOGRAM (TEE);  Surgeon: Jolaine Artist, MD;  Location: [facility] ENDOSCOPY;  Service: Cardiovascular;  Laterality: N/A;   Social History   Occupational History  . Not on file  Tobacco Use  . Smoking status: Never Smoker  . Smokeless tobacco: Never Used  Substance and Sexual Activity  . Alcohol use: Yes    Comment: socially glass red wine monthly  . Drug use: No  . Sexual activity: Yes    Birth control/protection: Post-menopausal

## 2018-08-04 ENCOUNTER — Other Ambulatory Visit: Payer: Self-pay | Admitting: Emergency Medicine

## 2018-08-04 DIAGNOSIS — D594 Other nonautoimmune hemolytic anemias: Secondary | ICD-10-CM

## 2018-08-06 ENCOUNTER — Ambulatory Visit (INDEPENDENT_AMBULATORY_CARE_PROVIDER_SITE_OTHER): Payer: Medicare Other | Admitting: Orthopaedic Surgery

## 2018-08-06 ENCOUNTER — Other Ambulatory Visit: Payer: Self-pay

## 2018-08-06 ENCOUNTER — Encounter (INDEPENDENT_AMBULATORY_CARE_PROVIDER_SITE_OTHER): Payer: Self-pay | Admitting: Orthopaedic Surgery

## 2018-08-06 DIAGNOSIS — S8001XA Contusion of right knee, initial encounter: Secondary | ICD-10-CM | POA: Diagnosis not present

## 2018-08-06 DIAGNOSIS — Z7901 Long term (current) use of anticoagulants: Secondary | ICD-10-CM | POA: Diagnosis not present

## 2018-08-06 NOTE — Progress Notes (Signed)
The patient is here for continued follow-up for traumatic hematoma of her right knee prepatellar area.  She has been on the blood thinner Eliquis and that is what led to a large hematoma.  I saw her last week and placed compressive wrap around this.  We did let her start her Eliquis back.  On exam today there is still a decent sized hematoma and there is causing some soft tissue necrosis.  I decided to perform an intraoffice procedure to decompress this.  After obtaining consent from her I prepped the anterior lateral aspect of her right knee with alcohol preps.  I anesthetized the area with 1% plain lidocaine.  I then used a #15 blade and opened up the prepatellar area with a small 1 cm incision.  I used a hemostat and then was able to completely decompress the hematoma from this area which gave her relief and took pressure off the soft tissues.  I cleaned the area again with alcohol swabs and Betadine.  I then reapproximate the skin with interrupted 3-0 nylon suture with just 3 sutures placed.  I then placed Bactroban ointment over the wounds and a compressive dressing.  I gave her dressing instructions.  We will see her back in 2 weeks to see how she is doing overall.  Also gave wound care instructions to her.  She tolerated the procedure well without difficulty and there were no complications.  If there is any issues before seeing her back in 2 weeks she will let us know.

## 2018-08-14 ENCOUNTER — Other Ambulatory Visit (HOSPITAL_COMMUNITY): Payer: Self-pay | Admitting: Internal Medicine

## 2018-08-20 ENCOUNTER — Ambulatory Visit (INDEPENDENT_AMBULATORY_CARE_PROVIDER_SITE_OTHER): Payer: Medicare Other | Admitting: Orthopaedic Surgery

## 2018-08-23 ENCOUNTER — Other Ambulatory Visit (HOSPITAL_COMMUNITY): Payer: Self-pay | Admitting: Internal Medicine

## 2018-09-10 ENCOUNTER — Other Ambulatory Visit (HOSPITAL_COMMUNITY): Payer: Self-pay | Admitting: Internal Medicine

## 2018-09-11 ENCOUNTER — Telehealth: Payer: Self-pay | Admitting: Oncology

## 2018-09-11 NOTE — Telephone Encounter (Signed)
Unable to reach patient per 4/16 sch message - left message for pt to call back to reschedule.

## 2018-09-15 ENCOUNTER — Inpatient Hospital Stay: Payer: Medicare Other | Attending: Internal Medicine

## 2018-09-26 ENCOUNTER — Telehealth: Payer: Self-pay | Admitting: Oncology

## 2018-09-26 NOTE — Telephone Encounter (Signed)
Returned patient's phone call regarding rescheduling an apportionment, left a voicemail.

## 2018-09-29 ENCOUNTER — Other Ambulatory Visit: Payer: Self-pay | Admitting: Cardiology

## 2018-09-29 DIAGNOSIS — H5711 Ocular pain, right eye: Secondary | ICD-10-CM | POA: Diagnosis not present

## 2018-09-30 DIAGNOSIS — R000 Tachycardia, unspecified: Secondary | ICD-10-CM | POA: Diagnosis not present

## 2018-09-30 DIAGNOSIS — R404 Transient alteration of awareness: Secondary | ICD-10-CM | POA: Diagnosis not present

## 2018-09-30 DIAGNOSIS — R0689 Other abnormalities of breathing: Secondary | ICD-10-CM | POA: Diagnosis not present

## 2018-09-30 DIAGNOSIS — I499 Cardiac arrhythmia, unspecified: Secondary | ICD-10-CM | POA: Diagnosis not present

## 2018-10-07 ENCOUNTER — Other Ambulatory Visit (HOSPITAL_COMMUNITY): Payer: Self-pay | Admitting: Internal Medicine

## 2018-10-15 ENCOUNTER — Other Ambulatory Visit (HOSPITAL_COMMUNITY): Payer: Self-pay

## 2018-10-15 MED ORDER — METOLAZONE 2.5 MG PO TABS: 2.5000 mg | ORAL_TABLET | Freq: Every day | ORAL | 1 refills | Status: AC | PRN

## 2018-10-27 DIAGNOSIS — 419620001 Death: Secondary | SNOMED CT | POA: Diagnosis not present

## 2018-10-27 DEATH — deceased

## 2018-12-15 ENCOUNTER — Telehealth: Payer: Self-pay | Admitting: *Deleted

## 2018-12-15 ENCOUNTER — Inpatient Hospital Stay: Payer: No Typology Code available for payment source

## 2018-12-15 ENCOUNTER — Inpatient Hospital Stay: Payer: No Typology Code available for payment source | Attending: Oncology | Admitting: Oncology

## 2018-12-15 NOTE — Telephone Encounter (Signed)
Called to follow up on missed appointment today. Per daughter, she died at home in 2018/11/04 in her sleep. Expressed out condolences and how much we enjoyed knowing her.
